# Patient Record
Sex: Female | Born: 1965
Health system: Southern US, Community
[De-identification: ages and names within clinical notes are randomized; demographics above are authoritative.]

## PROBLEM LIST (undated history)

## (undated) DIAGNOSIS — F332 Major depressive disorder, recurrent severe without psychotic features: Secondary | ICD-10-CM

## (undated) DIAGNOSIS — E119 Type 2 diabetes mellitus without complications: Secondary | ICD-10-CM

## (undated) DIAGNOSIS — D649 Anemia, unspecified: Secondary | ICD-10-CM

## (undated) DIAGNOSIS — R45851 Suicidal ideations: Secondary | ICD-10-CM

## (undated) DIAGNOSIS — G894 Chronic pain syndrome: Secondary | ICD-10-CM

## (undated) DIAGNOSIS — E669 Obesity, unspecified: Secondary | ICD-10-CM

## (undated) DIAGNOSIS — W3400XA Accidental discharge from unspecified firearms or gun, initial encounter: Secondary | ICD-10-CM

## (undated) DIAGNOSIS — Z79899 Other long term (current) drug therapy: Secondary | ICD-10-CM

## (undated) DIAGNOSIS — G43909 Migraine, unspecified, not intractable, without status migrainosus: Secondary | ICD-10-CM

## (undated) DIAGNOSIS — S21339A Puncture wound without foreign body of unspecified front wall of thorax with penetration into thoracic cavity, initial encounter: Secondary | ICD-10-CM

## (undated) DIAGNOSIS — B20 Human immunodeficiency virus [HIV] disease: Secondary | ICD-10-CM

## (undated) DIAGNOSIS — G4730 Sleep apnea, unspecified: Secondary | ICD-10-CM

## (undated) DIAGNOSIS — G8220 Paraplegia, unspecified: Secondary | ICD-10-CM

## (undated) DIAGNOSIS — Z113 Encounter for screening for infections with a predominantly sexual mode of transmission: Secondary | ICD-10-CM

## (undated) DIAGNOSIS — G839 Paralytic syndrome, unspecified: Secondary | ICD-10-CM

## (undated) HISTORY — DX: Obesity, unspecified: E66.9

## (undated) HISTORY — DX: Other long term (current) drug therapy: Z79.899

## (undated) HISTORY — DX: Accidental discharge from unspecified firearms or gun, initial encounter: W34.00XA

## (undated) HISTORY — DX: Encounter for screening for infections with a predominantly sexual mode of transmission: Z11.3

## (undated) HISTORY — PX: REDUCTION MAMMAPLASTY: SUR839

## (undated) HISTORY — PX: LEEP: SHX91

## (undated) HISTORY — DX: Migraine, unspecified, not intractable, without status migrainosus: G43.909

## (undated) HISTORY — DX: Type 2 diabetes mellitus without complications: E11.9

## (undated) HISTORY — DX: Chronic pain syndrome: G89.4

## (undated) HISTORY — PX: CHEST SURGERY: SHX595

## (undated) HISTORY — DX: Human immunodeficiency virus (HIV) disease: B20

## (undated) HISTORY — PX: COLONOSCOPY: SHX174

## (undated) HISTORY — DX: Suicidal ideations: R45.851

## (undated) HISTORY — DX: Major depressive disorder, recurrent severe without psychotic features: F33.2

## (undated) HISTORY — PX: BARIATRIC SURGERY: SHX1103

## (undated) HISTORY — PX: CHOLECYSTECTOMY: SHX55

## (undated) HISTORY — DX: Puncture wound without foreign body of unspecified front wall of thorax with penetration into thoracic cavity, initial encounter: S21.339A

## (undated) HISTORY — DX: Paraplegia, unspecified: G82.20

---

## 1998-01-15 ENCOUNTER — Other Ambulatory Visit: Admission: RE | Admit: 1998-01-15 | Discharge: 1998-01-15 | Payer: Self-pay

## 1999-09-17 ENCOUNTER — Ambulatory Visit (HOSPITAL_COMMUNITY): Admission: RE | Admit: 1999-09-17 | Discharge: 1999-09-17 | Payer: Self-pay | Admitting: *Deleted

## 2000-05-03 ENCOUNTER — Emergency Department (HOSPITAL_COMMUNITY): Admission: EM | Admit: 2000-05-03 | Discharge: 2000-05-03 | Payer: Self-pay | Admitting: Emergency Medicine

## 2000-05-05 ENCOUNTER — Emergency Department (HOSPITAL_COMMUNITY): Admission: EM | Admit: 2000-05-05 | Discharge: 2000-05-05 | Payer: Self-pay | Admitting: Emergency Medicine

## 2000-09-05 ENCOUNTER — Encounter: Admission: RE | Admit: 2000-09-05 | Discharge: 2000-12-04 | Payer: Self-pay | Admitting: Family Medicine

## 2001-02-10 ENCOUNTER — Encounter: Admission: RE | Admit: 2001-02-10 | Discharge: 2001-03-24 | Payer: Self-pay | Admitting: Family Medicine

## 2001-05-09 ENCOUNTER — Encounter: Admission: RE | Admit: 2001-05-09 | Discharge: 2001-05-09 | Payer: Self-pay | Admitting: Obstetrics & Gynecology

## 2001-05-09 ENCOUNTER — Other Ambulatory Visit: Admission: RE | Admit: 2001-05-09 | Discharge: 2001-05-09 | Payer: Self-pay | Admitting: Obstetrics

## 2002-04-17 ENCOUNTER — Ambulatory Visit: Admission: RE | Admit: 2002-04-17 | Discharge: 2002-04-17 | Payer: Self-pay | Admitting: Family Medicine

## 2002-12-01 ENCOUNTER — Emergency Department (HOSPITAL_COMMUNITY): Admission: EM | Admit: 2002-12-01 | Discharge: 2002-12-01 | Payer: Self-pay | Admitting: Emergency Medicine

## 2003-07-12 ENCOUNTER — Encounter (INDEPENDENT_AMBULATORY_CARE_PROVIDER_SITE_OTHER): Payer: Self-pay | Admitting: *Deleted

## 2003-07-12 ENCOUNTER — Encounter: Admission: RE | Admit: 2003-07-12 | Discharge: 2003-07-12 | Payer: Self-pay | Admitting: Family Medicine

## 2003-12-03 ENCOUNTER — Ambulatory Visit (HOSPITAL_COMMUNITY): Admission: RE | Admit: 2003-12-03 | Discharge: 2003-12-03 | Payer: Self-pay | Admitting: General Surgery

## 2004-04-03 ENCOUNTER — Encounter: Admission: RE | Admit: 2004-04-03 | Discharge: 2004-04-03 | Payer: Self-pay | Admitting: Family Medicine

## 2004-05-17 ENCOUNTER — Emergency Department (HOSPITAL_COMMUNITY): Admission: EM | Admit: 2004-05-17 | Discharge: 2004-05-17 | Payer: Self-pay | Admitting: Emergency Medicine

## 2004-05-27 ENCOUNTER — Encounter: Admission: RE | Admit: 2004-05-27 | Discharge: 2004-05-27 | Payer: Self-pay | Admitting: Family Medicine

## 2004-05-31 ENCOUNTER — Ambulatory Visit (HOSPITAL_BASED_OUTPATIENT_CLINIC_OR_DEPARTMENT_OTHER): Admission: RE | Admit: 2004-05-31 | Discharge: 2004-05-31 | Payer: Self-pay | Admitting: *Deleted

## 2004-06-01 ENCOUNTER — Ambulatory Visit (HOSPITAL_COMMUNITY): Admission: RE | Admit: 2004-06-01 | Discharge: 2004-06-01 | Payer: Self-pay | Admitting: *Deleted

## 2004-06-09 ENCOUNTER — Encounter: Admission: RE | Admit: 2004-06-09 | Discharge: 2004-06-09 | Payer: Self-pay | Admitting: *Deleted

## 2004-06-19 ENCOUNTER — Encounter: Admission: RE | Admit: 2004-06-19 | Discharge: 2004-06-19 | Payer: Self-pay | Admitting: *Deleted

## 2004-07-30 ENCOUNTER — Ambulatory Visit: Payer: Self-pay | Admitting: Family Medicine

## 2004-09-23 ENCOUNTER — Ambulatory Visit: Payer: Self-pay | Admitting: Psychology

## 2004-10-16 ENCOUNTER — Ambulatory Visit: Payer: Self-pay | Admitting: Psychology

## 2004-10-29 ENCOUNTER — Ambulatory Visit: Payer: Self-pay | Admitting: Family Medicine

## 2004-12-03 ENCOUNTER — Encounter: Admission: RE | Admit: 2004-12-03 | Discharge: 2004-12-03 | Payer: Self-pay | Admitting: Family Medicine

## 2005-03-03 ENCOUNTER — Ambulatory Visit: Payer: Self-pay | Admitting: Family Medicine

## 2005-04-08 ENCOUNTER — Ambulatory Visit: Payer: Self-pay | Admitting: Family Medicine

## 2005-07-07 ENCOUNTER — Encounter: Admission: RE | Admit: 2005-07-07 | Discharge: 2005-07-07 | Payer: Self-pay | Admitting: Family Medicine

## 2005-08-13 ENCOUNTER — Ambulatory Visit: Payer: Self-pay | Admitting: Family Medicine

## 2005-08-13 ENCOUNTER — Encounter: Payer: Self-pay | Admitting: Family Medicine

## 2005-08-30 ENCOUNTER — Ambulatory Visit: Payer: Self-pay | Admitting: Family Medicine

## 2005-09-07 ENCOUNTER — Ambulatory Visit: Payer: Self-pay | Admitting: Family Medicine

## 2005-09-23 ENCOUNTER — Ambulatory Visit: Payer: Self-pay | Admitting: Family Medicine

## 2005-10-20 ENCOUNTER — Ambulatory Visit: Payer: Self-pay | Admitting: Family Medicine

## 2005-11-22 ENCOUNTER — Ambulatory Visit: Payer: Self-pay | Admitting: Family Medicine

## 2005-12-08 ENCOUNTER — Ambulatory Visit: Payer: Self-pay | Admitting: Internal Medicine

## 2006-01-04 ENCOUNTER — Ambulatory Visit: Payer: Self-pay | Admitting: Family Medicine

## 2006-01-20 ENCOUNTER — Ambulatory Visit: Payer: Self-pay | Admitting: Internal Medicine

## 2006-02-24 ENCOUNTER — Ambulatory Visit: Payer: Self-pay | Admitting: Internal Medicine

## 2006-05-27 ENCOUNTER — Ambulatory Visit: Payer: Self-pay | Admitting: Family Medicine

## 2006-09-12 ENCOUNTER — Ambulatory Visit: Payer: Self-pay | Admitting: Internal Medicine

## 2006-12-19 ENCOUNTER — Ambulatory Visit: Payer: Self-pay | Admitting: Internal Medicine

## 2007-01-05 ENCOUNTER — Encounter: Admission: RE | Admit: 2007-01-05 | Discharge: 2007-04-05 | Payer: Self-pay | Admitting: Internal Medicine

## 2007-03-20 ENCOUNTER — Ambulatory Visit: Payer: Self-pay | Admitting: Internal Medicine

## 2007-03-24 ENCOUNTER — Ambulatory Visit: Payer: Self-pay | Admitting: Internal Medicine

## 2007-03-27 ENCOUNTER — Ambulatory Visit (HOSPITAL_COMMUNITY): Admission: RE | Admit: 2007-03-27 | Discharge: 2007-03-27 | Payer: Self-pay | Admitting: Internal Medicine

## 2007-04-18 ENCOUNTER — Encounter: Admission: RE | Admit: 2007-04-18 | Discharge: 2007-06-13 | Payer: Self-pay | Admitting: Internal Medicine

## 2007-05-16 ENCOUNTER — Ambulatory Visit: Payer: Self-pay | Admitting: Family Medicine

## 2007-05-31 ENCOUNTER — Ambulatory Visit: Payer: Self-pay | Admitting: Internal Medicine

## 2007-05-31 LAB — CONVERTED CEMR LAB
BUN: 12 mg/dL (ref 6–23)
CO2: 20 meq/L (ref 19–32)
Calcium: 9.2 mg/dL (ref 8.4–10.5)
Chloride: 102 meq/L (ref 96–112)
Glucose, Bld: 271 mg/dL — ABNORMAL HIGH (ref 70–99)
Sodium: 134 meq/L — ABNORMAL LOW (ref 135–145)

## 2007-06-08 ENCOUNTER — Encounter: Admission: RE | Admit: 2007-06-08 | Discharge: 2007-06-08 | Payer: Self-pay | Admitting: Internal Medicine

## 2007-07-05 ENCOUNTER — Emergency Department (HOSPITAL_COMMUNITY): Admission: EM | Admit: 2007-07-05 | Discharge: 2007-07-05 | Payer: Self-pay | Admitting: Emergency Medicine

## 2007-09-06 ENCOUNTER — Emergency Department (HOSPITAL_COMMUNITY): Admission: EM | Admit: 2007-09-06 | Discharge: 2007-09-06 | Payer: Self-pay | Admitting: Emergency Medicine

## 2007-10-23 ENCOUNTER — Emergency Department (HOSPITAL_COMMUNITY): Admission: EM | Admit: 2007-10-23 | Discharge: 2007-10-23 | Payer: Self-pay | Admitting: Emergency Medicine

## 2007-10-27 ENCOUNTER — Ambulatory Visit: Payer: Self-pay | Admitting: Internal Medicine

## 2007-10-27 LAB — CONVERTED CEMR LAB
Basophils Absolute: 0 10*3/uL (ref 0.0–0.1)
Eosinophils Absolute: 0.5 10*3/uL (ref 0.0–0.7)
HCT: 42.6 % (ref 36.0–46.0)
Lymphocytes Relative: 25 % (ref 12–46)
Lymphs Abs: 1.4 10*3/uL (ref 0.7–4.0)
MCHC: 32.6 g/dL (ref 30.0–36.0)
MCV: 80.8 fL (ref 78.0–100.0)
Monocytes Absolute: 0.6 10*3/uL (ref 0.1–1.0)
RBC: 5.27 M/uL — ABNORMAL HIGH (ref 3.87–5.11)
WBC: 5.7 10*3/uL (ref 4.0–10.5)

## 2007-11-01 ENCOUNTER — Ambulatory Visit: Payer: Self-pay | Admitting: Internal Medicine

## 2007-11-01 LAB — CONVERTED CEMR LAB
Basophils Relative: 0 % (ref 0–1)
CD4 T Helper %: 25 % — ABNORMAL LOW (ref 32–62)
Eosinophils Absolute: 0.2 10*3/uL (ref 0.0–0.7)
Eosinophils Relative: 2 % (ref 0–5)
HIV-1 RNA Quant, Log: 4.93 — ABNORMAL HIGH (ref <1.70)
Hemoglobin: 14.7 g/dL (ref 12.0–15.0)
Lymphs Abs: 2 10*3/uL (ref 0.7–4.0)
MCV: 82.5 fL (ref 78.0–100.0)
Monocytes Relative: 9 % (ref 3–12)
Neutro Abs: 4.8 10*3/uL (ref 1.7–7.7)
Platelets: 203 10*3/uL (ref 150–400)
RBC: 5.5 M/uL — ABNORMAL HIGH (ref 3.87–5.11)
RDW: 15.1 % (ref 11.5–15.5)
Triglycerides: 229 mg/dL — ABNORMAL HIGH (ref <150)
VLDL: 46 mg/dL — ABNORMAL HIGH (ref 0–40)

## 2007-12-25 ENCOUNTER — Emergency Department (HOSPITAL_COMMUNITY): Admission: EM | Admit: 2007-12-25 | Discharge: 2007-12-25 | Payer: Self-pay | Admitting: Emergency Medicine

## 2007-12-28 ENCOUNTER — Encounter: Admission: RE | Admit: 2007-12-28 | Discharge: 2007-12-28 | Payer: Self-pay | Admitting: Internal Medicine

## 2008-03-08 ENCOUNTER — Ambulatory Visit: Payer: Self-pay | Admitting: *Deleted

## 2008-03-08 ENCOUNTER — Ambulatory Visit: Payer: Self-pay | Admitting: Internal Medicine

## 2008-03-09 ENCOUNTER — Encounter (INDEPENDENT_AMBULATORY_CARE_PROVIDER_SITE_OTHER): Payer: Self-pay | Admitting: Internal Medicine

## 2008-03-09 LAB — CONVERTED CEMR LAB
Eosinophils Relative: 8 % — ABNORMAL HIGH (ref 0–5)
HCT: 47.5 % — ABNORMAL HIGH (ref 36.0–46.0)
HIV 1 RNA Quant: 92900 copies/mL — ABNORMAL HIGH (ref <50)
HIV-1 RNA Quant, Log: 4.97 — ABNORMAL HIGH (ref <1.70)
Hemoglobin: 15.3 g/dL — ABNORMAL HIGH (ref 12.0–15.0)
Lymphocytes Relative: 27 % (ref 12–46)
MCHC: 32.2 g/dL (ref 30.0–36.0)
Neutro Abs: 4.6 10*3/uL (ref 1.7–7.7)
Neutrophils Relative %: 57 % (ref 43–77)
TSH: 0.714 microintl units/mL (ref 0.350–5.50)
Total Lymphocytes %: 27 % (ref 12–46)
WBC: 8 10*3/uL (ref 4.0–10.5)

## 2008-03-10 ENCOUNTER — Emergency Department (HOSPITAL_COMMUNITY): Admission: EM | Admit: 2008-03-10 | Discharge: 2008-03-10 | Payer: Self-pay | Admitting: Emergency Medicine

## 2008-03-15 ENCOUNTER — Ambulatory Visit: Payer: Self-pay | Admitting: Internal Medicine

## 2008-04-14 ENCOUNTER — Emergency Department (HOSPITAL_COMMUNITY): Admission: EM | Admit: 2008-04-14 | Discharge: 2008-04-15 | Payer: Self-pay | Admitting: Emergency Medicine

## 2008-05-27 ENCOUNTER — Ambulatory Visit: Payer: Self-pay | Admitting: Internal Medicine

## 2008-06-28 ENCOUNTER — Encounter: Admission: RE | Admit: 2008-06-28 | Discharge: 2008-06-28 | Payer: Self-pay | Admitting: Internal Medicine

## 2008-08-02 ENCOUNTER — Inpatient Hospital Stay (HOSPITAL_COMMUNITY): Admission: EM | Admit: 2008-08-02 | Discharge: 2008-08-08 | Payer: Self-pay | Admitting: Emergency Medicine

## 2008-08-20 ENCOUNTER — Encounter (INDEPENDENT_AMBULATORY_CARE_PROVIDER_SITE_OTHER): Payer: Self-pay | Admitting: General Surgery

## 2008-08-21 ENCOUNTER — Inpatient Hospital Stay (HOSPITAL_COMMUNITY): Admission: AD | Admit: 2008-08-21 | Discharge: 2008-08-24 | Payer: Self-pay | Admitting: General Surgery

## 2008-08-30 ENCOUNTER — Emergency Department (HOSPITAL_COMMUNITY): Admission: EM | Admit: 2008-08-30 | Discharge: 2008-08-30 | Payer: Self-pay | Admitting: Emergency Medicine

## 2008-09-11 ENCOUNTER — Ambulatory Visit: Payer: Self-pay | Admitting: Internal Medicine

## 2008-10-24 ENCOUNTER — Encounter (INDEPENDENT_AMBULATORY_CARE_PROVIDER_SITE_OTHER): Payer: Self-pay | Admitting: Internal Medicine

## 2008-11-18 ENCOUNTER — Encounter: Admission: RE | Admit: 2008-11-18 | Discharge: 2008-11-18 | Payer: Self-pay | Admitting: Internal Medicine

## 2008-12-26 ENCOUNTER — Ambulatory Visit: Payer: Self-pay | Admitting: Internal Medicine

## 2008-12-26 LAB — CONVERTED CEMR LAB
ALT: 16 units/L (ref 0–35)
AST: 26 units/L (ref 0–37)
Absolute CD4: 80 #/uL — ABNORMAL LOW (ref 381–1469)
Basophils Relative: 0 % (ref 0–1)
CO2: 21 meq/L (ref 19–32)
Calcium: 9.1 mg/dL (ref 8.4–10.5)
Eosinophils Absolute: 0.1 10*3/uL (ref 0.0–0.7)
Eosinophils Relative: 2 % (ref 0–5)
Glucose, Bld: 205 mg/dL — ABNORMAL HIGH (ref 70–99)
HCT: 43 % (ref 36.0–46.0)
HDL: 37 mg/dL — ABNORMAL LOW (ref >39)
HIV 1 RNA Quant: 105000 copies/mL — ABNORMAL HIGH (ref <48)
Lymphs Abs: 1.6 10*3/uL (ref 0.7–4.0)
Monocytes Absolute: 0.6 10*3/uL (ref 0.1–1.0)
Monocytes Relative: 12 % (ref 3–12)
Neutro Abs: 2.9 10*3/uL (ref 1.7–7.7)
Neutrophils Relative %: 55 % (ref 43–77)
Potassium: 4.4 meq/L (ref 3.5–5.3)
Total Bilirubin: 0.4 mg/dL (ref 0.3–1.2)
Total CHOL/HDL Ratio: 3.9
Total lymphocyte count: 1590 cells/mcL (ref 700–3300)
Triglycerides: 168 mg/dL — ABNORMAL HIGH (ref <150)
WBC, lymph enumeration: 5.3 10*3/uL (ref 4.0–10.5)

## 2009-01-14 ENCOUNTER — Encounter
Admission: RE | Admit: 2009-01-14 | Discharge: 2009-01-14 | Payer: Self-pay | Admitting: Physical Medicine & Rehabilitation

## 2009-02-28 ENCOUNTER — Telehealth (INDEPENDENT_AMBULATORY_CARE_PROVIDER_SITE_OTHER): Payer: Self-pay | Admitting: *Deleted

## 2009-05-22 ENCOUNTER — Ambulatory Visit: Payer: Self-pay | Admitting: Internal Medicine

## 2009-05-22 LAB — CONVERTED CEMR LAB
ALT: 18 units/L (ref 0–35)
Absolute CD4: 45 #/uL — ABNORMAL LOW (ref 381–1469)
Albumin: 3.7 g/dL (ref 3.5–5.2)
Alkaline Phosphatase: 52 units/L (ref 39–117)
Basophils Relative: 0 % (ref 0–1)
CO2: 20 meq/L (ref 19–32)
Chloride: 102 meq/L (ref 96–112)
Glucose, Bld: 202 mg/dL — ABNORMAL HIGH (ref 70–99)
Platelets: 143 10*3/uL — ABNORMAL LOW (ref 150–400)
Potassium: 3.8 meq/L (ref 3.5–5.3)
RBC: 4.85 M/uL (ref 3.87–5.11)
Sodium: 138 meq/L (ref 135–145)
Total Bilirubin: 0.5 mg/dL (ref 0.3–1.2)
WBC: 4.8 10*3/uL (ref 4.0–10.5)

## 2009-06-17 ENCOUNTER — Ambulatory Visit: Payer: Self-pay | Admitting: Internal Medicine

## 2009-06-17 IMAGING — US IR US GUIDE VASC ACCESS RIGHT
1 series · 1 of 1 positions shown · non-contrast
Comparison: none

CLINICAL DATA: Cholecystitis; central venous access is requested
preop for cholecystectomy.

RIGHT  UPPER EXTREMITY PICC PLACEMENT WITH ULTRASOUND AND FLUORO
GUIDANCE
TECHNIQUE: The right arm was prepped with chlorhexidine, draped in
the usual sterile fashion using maximum barrier technique and
infiltrated locally with 1% Lidocaine.  Ultrasound demonstrated
patency of the right cephalic  vein.  Under real-time ultrasound
guidance, this vein was accessed with a 21 gauge micropuncture
needle.  Ultrasound image documentation was performed.  The needle
was exchanged over a guidewire for a peel-away sheath through which
a 5 French double  lumen PICC trimmed to 44cm was advanced,
positioned with its tip at the distal SVC/right atrial junction.
Fluoroscopy during the procedure and fluoro spot radiograph
confirms appropriate catheter position.  The catheter was flushed,
secured to the skin with Prolene sutures, and covered with a
sterile dressing.  No immediate complication.
Fluoroscopy Time: 1.6 minutes.

[Series 1: sp us guide vasc access*right* · 1 of 1 slices shown]
[im 1/1]
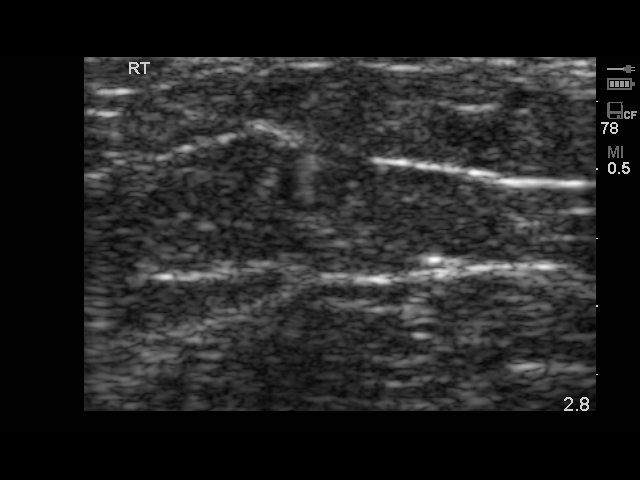

[1 of 1 positions shown; findings below may reference images not displayed]

IMPRESSION: Technically successful right arm PICC placement with ultrasound and
fluoroscopic guidance.  The catheter is ready for use.

Read by: Preko, Stephen Boadi.-MAROZS

## 2009-06-25 ENCOUNTER — Ambulatory Visit: Payer: Self-pay | Admitting: Internal Medicine

## 2009-06-25 ENCOUNTER — Encounter (INDEPENDENT_AMBULATORY_CARE_PROVIDER_SITE_OTHER): Payer: Self-pay | Admitting: Adult Health

## 2009-06-25 LAB — CONVERTED CEMR LAB: Hgb A1c MFr Bld: 9.2 % — ABNORMAL HIGH (ref 4.6–6.1)

## 2009-06-30 ENCOUNTER — Encounter: Payer: Self-pay | Admitting: Internal Medicine

## 2009-06-30 ENCOUNTER — Ambulatory Visit: Payer: Self-pay | Admitting: Internal Medicine

## 2009-06-30 DIAGNOSIS — E109 Type 1 diabetes mellitus without complications: Secondary | ICD-10-CM | POA: Insufficient documentation

## 2009-06-30 DIAGNOSIS — B20 Human immunodeficiency virus [HIV] disease: Secondary | ICD-10-CM | POA: Insufficient documentation

## 2009-06-30 DIAGNOSIS — F3289 Other specified depressive episodes: Secondary | ICD-10-CM | POA: Insufficient documentation

## 2009-06-30 DIAGNOSIS — G8220 Paraplegia, unspecified: Secondary | ICD-10-CM | POA: Insufficient documentation

## 2009-06-30 DIAGNOSIS — F329 Major depressive disorder, single episode, unspecified: Secondary | ICD-10-CM

## 2009-06-30 LAB — CONVERTED CEMR LAB: Blood Glucose, Fingerstick: 210

## 2009-07-18 ENCOUNTER — Inpatient Hospital Stay (HOSPITAL_COMMUNITY): Admission: EM | Admit: 2009-07-18 | Discharge: 2009-07-20 | Payer: Self-pay | Admitting: Emergency Medicine

## 2009-07-31 ENCOUNTER — Ambulatory Visit: Payer: Self-pay | Admitting: Internal Medicine

## 2009-07-31 LAB — CONVERTED CEMR LAB
Albumin: 4.1 g/dL (ref 3.5–5.2)
Basophils Absolute: 0 10*3/uL (ref 0.0–0.1)
CO2: 18 meq/L — ABNORMAL LOW (ref 19–32)
Calcium: 9.2 mg/dL (ref 8.4–10.5)
Chloride: 100 meq/L (ref 96–112)
Eosinophils Relative: 4 % (ref 0–5)
Glucose, Bld: 239 mg/dL — ABNORMAL HIGH (ref 70–99)
HCT: 37.9 % (ref 36.0–46.0)
HIV 1 RNA Quant: 395 copies/mL — ABNORMAL HIGH (ref <48)
HIV-1 RNA Quant, Log: 2.6 — ABNORMAL HIGH (ref <1.68)
Lymphocytes Relative: 29 % (ref 12–46)
Lymphs Abs: 1.5 10*3/uL (ref 0.7–4.0)
Monocytes Relative: 11 % (ref 3–12)
Platelets: 236 10*3/uL (ref 150–400)
Potassium: 3.9 meq/L (ref 3.5–5.3)
RBC: 4.44 M/uL (ref 3.87–5.11)
Sodium: 135 meq/L (ref 135–145)
WBC: 5.1 10*3/uL (ref 4.0–10.5)

## 2009-08-11 ENCOUNTER — Ambulatory Visit: Payer: Self-pay | Admitting: Internal Medicine

## 2009-08-11 ENCOUNTER — Encounter: Payer: Self-pay | Admitting: Internal Medicine

## 2009-08-19 ENCOUNTER — Telehealth: Payer: Self-pay | Admitting: Internal Medicine

## 2009-09-15 ENCOUNTER — Encounter: Payer: Self-pay | Admitting: Internal Medicine

## 2009-09-15 ENCOUNTER — Ambulatory Visit: Payer: Self-pay | Admitting: Internal Medicine

## 2009-09-15 DIAGNOSIS — R21 Rash and other nonspecific skin eruption: Secondary | ICD-10-CM | POA: Insufficient documentation

## 2009-09-15 DIAGNOSIS — M549 Dorsalgia, unspecified: Secondary | ICD-10-CM | POA: Insufficient documentation

## 2009-09-15 LAB — CONVERTED CEMR LAB
Alkaline Phosphatase: 101 units/L (ref 39–117)
BUN: 13 mg/dL (ref 6–23)
Blood Glucose, Fingerstick: 77
CD4 T Helper %: 10 % — ABNORMAL LOW (ref 32–62)
Chloride: 104 meq/L (ref 96–112)
Creatinine, Ser: 0.62 mg/dL (ref 0.40–1.20)
Eosinophils Absolute: 0.2 10*3/uL (ref 0.0–0.7)
Eosinophils Relative: 3 % (ref 0–5)
Glucose, Bld: 59 mg/dL — ABNORMAL LOW (ref 70–99)
HIV 1 RNA Quant: <48 copies/mL (ref <48)
Hemoglobin: 10.9 g/dL — ABNORMAL LOW (ref 12.0–15.0)
Lymphs Abs: 1.4 10*3/uL (ref 0.7–4.0)
MCV: 83.5 fL (ref 78.0–100.0)
Monocytes Absolute: 0.5 10*3/uL (ref 0.1–1.0)
Monocytes Relative: 7 % (ref 3–12)
Neutro Abs: 4.5 10*3/uL (ref 1.7–7.7)
Potassium: 4.2 meq/L (ref 3.5–5.3)
RBC: 4.05 M/uL (ref 3.87–5.11)
Total Bilirubin: 0.3 mg/dL (ref 0.3–1.2)
Total Protein: 7.6 g/dL (ref 6.0–8.3)

## 2009-09-18 ENCOUNTER — Telehealth: Payer: Self-pay | Admitting: Internal Medicine

## 2009-10-13 ENCOUNTER — Telehealth: Payer: Self-pay | Admitting: Internal Medicine

## 2009-11-26 ENCOUNTER — Ambulatory Visit: Payer: Self-pay | Admitting: Obstetrics and Gynecology

## 2009-11-26 ENCOUNTER — Encounter: Payer: Self-pay | Admitting: Obstetrics & Gynecology

## 2009-11-26 LAB — CONVERTED CEMR LAB: FSH: 2.8 milliintl units/mL

## 2009-12-05 ENCOUNTER — Ambulatory Visit (HOSPITAL_COMMUNITY): Admission: RE | Admit: 2009-12-05 | Discharge: 2009-12-05 | Payer: Self-pay | Admitting: Internal Medicine

## 2009-12-16 ENCOUNTER — Ambulatory Visit: Payer: Self-pay | Admitting: Internal Medicine

## 2010-01-01 ENCOUNTER — Telehealth: Payer: Self-pay | Admitting: Internal Medicine

## 2010-02-02 ENCOUNTER — Telehealth: Payer: Self-pay | Admitting: Internal Medicine

## 2010-02-02 ENCOUNTER — Encounter: Payer: Self-pay | Admitting: Internal Medicine

## 2010-02-03 ENCOUNTER — Ambulatory Visit: Payer: Self-pay | Admitting: Internal Medicine

## 2010-02-03 LAB — CONVERTED CEMR LAB
Alkaline Phosphatase: 141 units/L — ABNORMAL HIGH (ref 39–117)
Basophils Relative: 0 % (ref 0–1)
Eosinophils Absolute: 0.3 10*3/uL (ref 0.0–0.7)
Eosinophils Relative: 5 % (ref 0–5)
Glucose, Bld: 118 mg/dL — ABNORMAL HIGH (ref 70–99)
HCT: 39.4 % (ref 36.0–46.0)
HIV 1 RNA Quant: <48 copies/mL (ref <48)
LDL Cholesterol: 95 mg/dL (ref 0–99)
MCHC: 31.5 g/dL (ref 30.0–36.0)
MCV: 80.6 fL (ref 78.0–100.0)
Monocytes Absolute: 0.4 10*3/uL (ref 0.1–1.0)
Monocytes Relative: 7 % (ref 3–12)
Neutrophils Relative %: 69 % (ref 43–77)
RBC: 4.89 M/uL (ref 3.87–5.11)
Sodium: 139 meq/L (ref 135–145)
Total Bilirubin: 0.3 mg/dL (ref 0.3–1.2)
Total CHOL/HDL Ratio: 6
Total Protein: 7.4 g/dL (ref 6.0–8.3)
VLDL: 56 mg/dL — ABNORMAL HIGH (ref 0–40)

## 2010-02-18 ENCOUNTER — Ambulatory Visit: Payer: Self-pay | Admitting: Internal Medicine

## 2010-02-18 DIAGNOSIS — I10 Essential (primary) hypertension: Secondary | ICD-10-CM | POA: Insufficient documentation

## 2010-03-08 ENCOUNTER — Emergency Department (HOSPITAL_COMMUNITY): Admission: EM | Admit: 2010-03-08 | Discharge: 2010-03-08 | Payer: Self-pay | Admitting: Emergency Medicine

## 2010-03-27 ENCOUNTER — Ambulatory Visit: Payer: Self-pay | Admitting: Internal Medicine

## 2010-03-29 ENCOUNTER — Other Ambulatory Visit: Admission: RE | Admit: 2010-03-29 | Discharge: 2010-03-29 | Payer: Self-pay | Admitting: Obstetrics and Gynecology

## 2010-04-01 ENCOUNTER — Ambulatory Visit: Payer: Self-pay | Admitting: Obstetrics & Gynecology

## 2010-04-30 ENCOUNTER — Ambulatory Visit: Payer: Self-pay | Admitting: Obstetrics & Gynecology

## 2010-05-13 ENCOUNTER — Encounter: Payer: Self-pay | Admitting: Internal Medicine

## 2010-05-18 ENCOUNTER — Ambulatory Visit: Payer: Self-pay | Admitting: Internal Medicine

## 2010-05-25 ENCOUNTER — Ambulatory Visit (HOSPITAL_COMMUNITY): Admission: RE | Admit: 2010-05-25 | Discharge: 2010-05-25 | Payer: Self-pay | Admitting: Internal Medicine

## 2010-07-01 ENCOUNTER — Emergency Department (HOSPITAL_COMMUNITY): Admission: EM | Admit: 2010-07-01 | Discharge: 2010-07-01 | Payer: Self-pay | Admitting: Emergency Medicine

## 2010-07-22 ENCOUNTER — Ambulatory Visit: Payer: Self-pay | Admitting: Internal Medicine

## 2010-07-22 LAB — CONVERTED CEMR LAB
ALT: 14 units/L (ref 0–35)
AST: 12 units/L (ref 0–37)
Albumin: 4.1 g/dL (ref 3.5–5.2)
Alkaline Phosphatase: 127 units/L — ABNORMAL HIGH (ref 39–117)
Basophils Absolute: 0 10*3/uL (ref 0.0–0.1)
Basophils Relative: 0 % (ref 0–1)
Calcium: 9.1 mg/dL (ref 8.4–10.5)
Chloride: 106 meq/L (ref 96–112)
Creatinine, Ser: 0.67 mg/dL (ref 0.40–1.20)
Eosinophils Absolute: 0.1 10*3/uL (ref 0.0–0.7)
MCHC: 33.3 g/dL (ref 30.0–36.0)
MCV: 81.4 fL (ref 78.0–100.0)
Neutro Abs: 3.7 10*3/uL (ref 1.7–7.7)
Neutrophils Relative %: 65 % (ref 43–77)
Potassium: 4.1 meq/L (ref 3.5–5.3)
RDW: 15.2 % (ref 11.5–15.5)

## 2010-08-05 ENCOUNTER — Ambulatory Visit: Payer: Self-pay | Admitting: Internal Medicine

## 2010-09-24 ENCOUNTER — Inpatient Hospital Stay (HOSPITAL_COMMUNITY): Admission: EM | Admit: 2010-09-24 | Discharge: 2010-04-07 | Payer: Self-pay | Admitting: Emergency Medicine

## 2010-09-25 ENCOUNTER — Ambulatory Visit: Payer: Self-pay | Admitting: Obstetrics & Gynecology
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
## 2010-10-23 ENCOUNTER — Ambulatory Visit: Admit: 2010-10-23 | Payer: Self-pay | Admitting: Family Medicine

## 2010-11-03 ENCOUNTER — Ambulatory Visit: Admit: 2010-11-03 | Payer: Self-pay | Admitting: Internal Medicine

## 2010-11-08 ENCOUNTER — Encounter: Payer: Self-pay | Admitting: Internal Medicine

## 2010-11-08 ENCOUNTER — Encounter: Payer: Self-pay | Admitting: Family Medicine

## 2010-11-09 ENCOUNTER — Encounter: Payer: Self-pay | Admitting: Internal Medicine

## 2010-11-13 ENCOUNTER — Other Ambulatory Visit: Payer: Self-pay | Admitting: Obstetrics and Gynecology

## 2010-11-13 ENCOUNTER — Ambulatory Visit
Admission: RE | Admit: 2010-11-13 | Discharge: 2010-11-13 | Payer: Self-pay | Source: Home / Self Care | Attending: Family Medicine | Admitting: Family Medicine

## 2010-11-14 ENCOUNTER — Encounter: Payer: Self-pay | Admitting: Internal Medicine

## 2010-11-14 ENCOUNTER — Observation Stay (HOSPITAL_COMMUNITY)
Admission: EM | Admit: 2010-11-14 | Discharge: 2010-11-16 | Payer: Self-pay | Source: Home / Self Care | Attending: Internal Medicine | Admitting: Internal Medicine

## 2010-11-14 LAB — GLUCOSE, CAPILLARY
Glucose-Capillary: 173 mg/dL — ABNORMAL HIGH (ref 70–99)
Glucose-Capillary: 129 mg/dL — ABNORMAL HIGH (ref 70–99)
Glucose-Capillary: 135 mg/dL — ABNORMAL HIGH (ref 70–99)

## 2010-11-14 LAB — DIFFERENTIAL
Lymphocytes Relative: 28 % (ref 12–46)
Lymphs Abs: 1.7 10*3/uL (ref 0.7–4.0)
Neutrophils Relative %: 61 % (ref 43–77)

## 2010-11-14 LAB — CBC
HCT: 34.8 % — ABNORMAL LOW (ref 36.0–46.0)
MCV: 78 fL (ref 78.0–100.0)
RBC: 4.46 MIL/uL (ref 3.87–5.11)
WBC: 6 10*3/uL (ref 4.0–10.5)

## 2010-11-14 LAB — COMPREHENSIVE METABOLIC PANEL
Albumin: 3.5 g/dL (ref 3.5–5.2)
Alkaline Phosphatase: 95 U/L (ref 39–117)
BUN: 13 mg/dL (ref 6–23)
Chloride: 102 mEq/L (ref 96–112)
Glucose, Bld: 179 mg/dL — ABNORMAL HIGH (ref 70–99)
Potassium: 3.3 mEq/L — ABNORMAL LOW (ref 3.5–5.1)
Total Bilirubin: 0.3 mg/dL (ref 0.3–1.2)

## 2010-11-14 LAB — URINALYSIS, ROUTINE W REFLEX MICROSCOPIC
Ketones, ur: NEGATIVE mg/dL
Nitrite: POSITIVE — AB
Specific Gravity, Urine: 1.027 (ref 1.005–1.030)
Urine Glucose, Fasting: NEGATIVE mg/dL
pH: 6 (ref 5.0–8.0)

## 2010-11-14 LAB — RAPID URINE DRUG SCREEN, HOSP PERFORMED
Amphetamines: NOT DETECTED
Barbiturates: NOT DETECTED

## 2010-11-14 LAB — URINE MICROSCOPIC-ADD ON

## 2010-11-14 LAB — POCT CARDIAC MARKERS: Troponin i, poc: 0.07 ng/mL (ref 0.00–0.09)

## 2010-11-14 LAB — LIPASE, BLOOD: Lipase: 15 U/L (ref 11–59)

## 2010-11-14 NOTE — Progress Notes (Signed)
NAME:  Ellen Lane, Ellen Lane              ACCOUNT NO.:  [id number]  MEDICAL RECORD NO.:  [id number]          PATIENT TYPE:  WOC  LOCATION:  [facility]                   FACILITY:  [facility]  PHYSICIAN:  Catalina Antigua, MD     DATE OF BIRTH:  [birth year]  DATE OF SERVICE:                                 CLINIC NOTE  This is a 45 year old para G3, P2-0-1-2 with history of diabetes, HIV, hypertension, and paraplegic with a history of low-grade SIL in February of 2011, followed by normal colposcopy in June of 2011, who presents today for 6 months repeat Pap smear.  The patient is also complaining of a few days of nausea and emesis associated with some abdominal cramping pain.  Pap smear was performed today.  The patient will be contacted with any abnormal results.  Otherwise, patient is to present in 6 months for repeat Pap smear.  The patient then was advised to increase her fluid intake as to prevent dehydration and she may have gastroenteritis. The patient was advised to follow up with her primary care physician or present to the emergency room if her symptoms persisted by Monday.          ______________________________ Catalina Antigua, MD    PC/MEDQ  D:  11/13/2010  T:  11/14/2010  Job:  [id number]

## 2010-11-15 ENCOUNTER — Encounter: Payer: Self-pay | Admitting: Internal Medicine

## 2010-11-15 DIAGNOSIS — N390 Urinary tract infection, site not specified: Secondary | ICD-10-CM | POA: Insufficient documentation

## 2010-11-15 DIAGNOSIS — Z87448 Personal history of other diseases of urinary system: Secondary | ICD-10-CM | POA: Insufficient documentation

## 2010-11-15 LAB — COMPREHENSIVE METABOLIC PANEL
AST: 12 U/L (ref 0–37)
Albumin: 2.9 g/dL — ABNORMAL LOW (ref 3.5–5.2)
Calcium: 8.5 mg/dL (ref 8.4–10.5)
Chloride: 107 mEq/L (ref 96–112)
Creatinine, Ser: 0.5 mg/dL (ref 0.4–1.2)
GFR calc Af Amer: >60 mL/min (ref >60)
Total Bilirubin: 0.1 mg/dL — ABNORMAL LOW (ref 0.3–1.2)
Total Protein: 5.9 g/dL — ABNORMAL LOW (ref 6.0–8.3)

## 2010-11-15 LAB — CBC
MCH: 25.4 pg — ABNORMAL LOW (ref 26.0–34.0)
Platelets: 183 10*3/uL (ref 150–400)
RBC: 4.1 MIL/uL (ref 3.87–5.11)
RDW: 14.5 % (ref 11.5–15.5)

## 2010-11-15 LAB — HEMOGLOBIN A1C
Hgb A1c MFr Bld: 7.1 % — ABNORMAL HIGH (ref <5.7)
Mean Plasma Glucose: 157 mg/dL — ABNORMAL HIGH (ref <117)

## 2010-11-15 LAB — GLUCOSE, CAPILLARY
Glucose-Capillary: 158 mg/dL — ABNORMAL HIGH (ref 70–99)
Glucose-Capillary: 232 mg/dL — ABNORMAL HIGH (ref 70–99)
Glucose-Capillary: 271 mg/dL — ABNORMAL HIGH (ref 70–99)

## 2010-11-16 ENCOUNTER — Encounter (INDEPENDENT_AMBULATORY_CARE_PROVIDER_SITE_OTHER): Payer: Self-pay | Admitting: *Deleted

## 2010-11-16 LAB — BASIC METABOLIC PANEL
BUN: 7 mg/dL (ref 6–23)
Chloride: 106 mEq/L (ref 96–112)
Creatinine, Ser: 0.65 mg/dL (ref 0.4–1.2)
GFR calc non Af Amer: >60 mL/min (ref >60)
Glucose, Bld: 215 mg/dL — ABNORMAL HIGH (ref 70–99)

## 2010-11-16 LAB — CBC
HCT: 35.1 % — ABNORMAL LOW (ref 36.0–46.0)
MCH: 26.1 pg (ref 26.0–34.0)
MCV: 77.7 fL — ABNORMAL LOW (ref 78.0–100.0)
Platelets: 218 10*3/uL (ref 150–400)
RDW: 14.5 % (ref 11.5–15.5)

## 2010-11-16 LAB — GLUCOSE, CAPILLARY

## 2010-11-17 ENCOUNTER — Ambulatory Visit: Admit: 2010-11-17 | Payer: Self-pay | Admitting: Internal Medicine

## 2010-11-17 LAB — URINE CULTURE: Colony Count: >=100000

## 2010-11-18 LAB — HIV-1 RNA QUANT-NO REFLEX-BLD
HIV 1 RNA Quant: <20 copies/mL (ref <20)
HIV-1 RNA Quant, Log: <1.3 {Log} (ref <1.30)

## 2010-11-19 LAB — HIV 1/2 CONFIRMATION: HIV-1 antibody: POSITIVE

## 2010-11-19 NOTE — Assessment & Plan Note (Signed)
Summary: 2WK F/U/VS   CC:  follow-up visit, lab results, and c/o mouth soreness and rash on face (cream is no longer helping).  History of Present Illness: Pt c/o some painful lesions on the roof of her mouth.  She also has a pruritic rash on her face.  The triamcinolone cream was helping but it no longer is effective. No missed doses of her Atripla.  Preventive Screening-Counseling & Management  Alcohol-Tobacco     Alcohol drinks/day: 0     Smoking Status: never     Passive Smoke Exposure: No  Caffeine-Diet-Exercise     Caffeine use/day: 1     Does Patient Exercise: no  Safety-Violence-Falls     Seat Belt Use: yes      Drug Use:  No.     Updated Prior Medication List: ATRIPLA 600-200-300 MG TABS (EFAVIRENZ-EMTRICITAB-TENOFOVIR) Take 1 tablet by mouth at bedtime CHLORTHALIDONE 25 MG TABS (CHLORTHALIDONE) take 1/2 tablet a day ONE TOUCH DELICA LANCETS  MISC (LANCETS) as directed LEXAPRO 20 MG TABS (ESCITALOPRAM OXALATE) Take 1 tablet by mouth once a day BENAZEPRIL HCL 5 MG TABS (BENAZEPRIL HCL) Take 1 tablet by mouth once a day LANTUS 100 UNIT/ML SOLN (INSULIN GLARGINE) 70 units a day METFORMIN HCL 1000 MG TABS (METFORMIN HCL) Take 1 tablet by mouth two times a day NOVOLOG 100 UNIT/ML SOLN (INSULIN ASPART) per sliding scale PERCOCET 10-325 MG TABS (OXYCODONE-ACETAMINOPHEN) Take 1 tablet by mouth every 8 hours as needed VALTREX 500 MG TABS (VALACYCLOVIR HCL) Take 1 tablet by mouth two times a day BETAMETHASONE DIPROPIONATE 0.05 % CREA (BETAMETHASONE DIPROPIONATE) apply two times a day  Current Allergies (reviewed today): ! SULFA Review of Systems  The patient denies anorexia, fever, and weight loss.    Vital Signs:  Patient profile:   45 year old female Menstrual status:  irregular Temp:     98.5 degrees F (36.94 degrees C) oral Pulse rate:   90 / minute BP sitting:   152 / 97  (right arm)  Vitals Entered By: Wendall Mola CMA Duncan Dull) (Feb 18, 2010 2:18  PM) CC: follow-up visit, lab results, c/o mouth soreness and rash on face (cream is no longer helping) Is Patient Diabetic? Yes Did you bring your meter with you today? No Pain Assessment Patient in pain? yes     Location: mouth Intensity: 8 Type: aching Onset of pain  Constant Nutritional Status BMI of > 30 = obese Nutritional Status Detail appetite "good"  Does patient need assistance? Functional Status Cook/clean, Shopping, Social activities Ambulation Wheelchair Comments pt uses wheelchair   Physical Exam  General:  alert, well-developed, well-nourished, and well-hydrated.   Head:  normocephalic and atraumatic.   Mouth:  few irregular lesions on upper palate Lungs:  normal breath sounds.          Medication Adherence: 02/18/2010   Adherence to medications reviewed with patient. Counseling to provide adequate adherence provided   Prevention For Positives: 02/18/2010   Safe sex practices discussed with patient. Condoms offered.                             Impression & Recommendations:  Problem # 1:  HIV DISEASE (ICD-042) Pt.s most recent CD4ct was 190 and VL <48 .  Pt instructed to continue the current antiretroviral regimen.  Pt encouraged to take medication regularly and not miss doses.  Pt will f/u in 3 months for repeat blood work and will see  me 2 weeks later.  Diagnostics Reviewed:  CD4: 190 (02/03/2010)   WBC: 5.7 (02/03/2010)   PMN (bands): 0 (12/26/2008)   Hgb: 12.4 (02/03/2010)   HCT: 39.4 (02/03/2010)   Platelets: 243 (02/03/2010) HIV-1 RNA: <48 copies/mL (02/03/2010)     Problem # 2:  FACIAL RASH (ICD-782.1)  The following medications were removed from the medication list:    Triamcinolone Acetonide 0.1 % Crea (Triamcinolone acetonide) .Marland Kitchen... Apply two times a day Her updated medication list for this problem includes:    Betamethasone Dipropionate 0.05 % Crea (Betamethasone dipropionate) .Marland Kitchen... Apply two times a day  Problem # 3:  ESSENTIAL  HYPERTENSION, BENIGN (ICD-401.1) referred her back to Dr.Talbot her PCP for adjustment of medication. Her updated medication list for this problem includes:    Chlorthalidone 25 Mg Tabs (Chlorthalidone) .Marland Kitchen... Take 1/2 tablet a day    Benazepril Hcl 5 Mg Tabs (Benazepril hcl) .Marland Kitchen... Take 1 tablet by mouth once a day  Medications Added to Medication List This Visit: 1)  Valtrex 500 Mg Tabs (Valacyclovir hcl) .... Take 1 tablet by mouth two times a day 2)  Betamethasone Dipropionate 0.05 % Crea (Betamethasone dipropionate) .... Apply two times a day  Other Orders: Est. Patient Level III (12458) Future Orders: T-CD4SP ([facility]) (CD4SP) ... 05/19/2010 T-HIV Viral Load [phone]) ... 05/19/2010 T-Comprehensive Metabolic Panel [phone]) ... 05/19/2010 T-CBC w/Diff ([id number]) ... 05/19/2010  Patient Instructions: 1)  Please schedule a follow-up appointment in 3 months, 2 weeks after labs.  Prescriptions: ATRIPLA 600-200-300 MG TABS (EFAVIRENZ-EMTRICITAB-TENOFOVIR) Take 1 tablet by mouth at bedtime  #30 x 5   Entered and Authorized by:   Yisroel Ramming MD   Signed by:   Yisroel Ramming MD on 02/18/2010   Method used:   Print then Give to Patient   RxID:   [id number] VALTREX 500 MG TABS (VALACYCLOVIR HCL) Take 1 tablet by mouth two times a day  #14 x 0   Entered and Authorized by:   Yisroel Ramming MD   Signed by:   Yisroel Ramming MD on 02/18/2010   Method used:   Print then Give to Patient   RxID:   [id number] BETAMETHASONE DIPROPIONATE 0.05 % CREA (BETAMETHASONE DIPROPIONATE) apply two times a day  #45 gm x 3   Entered and Authorized by:   Yisroel Ramming MD   Signed by:   Yisroel Ramming MD on 02/18/2010   Method used:   Print then Give to Patient   RxID:   [id number]

## 2010-11-19 NOTE — Miscellaneous (Signed)
Summary: Orders Update labs  Clinical Lists Changes  Problems: Added new problem of ENCOUNTER FOR LONG-TERM USE OF OTHER MEDICATIONS (ICD-V58.69) Orders: Added new Test order of T-Lipid Profile [phone]) - Signed Added new Test order of T-CBC w/Diff [phone]) - Signed Added new Test order of T-CD4SP [facility]) (CD4SP) - Signed Added new Test order of T-Comprehensive Metabolic Panel [phone]) - Signed Added new Test order of T-HIV Viral Load [phone]) - Signed Added new Test order of T-RPR (Syphilis) [phone]) - Signed     Process Orders Check Orders Results:     [facility]: Check successful Order queued for requisitioning for [facility]: February 02, 2010 12:17 PM  Tests Sent for requisitioning (February 02, 2010 12:17 PM):     02/03/2010: [facility] -- T-Lipid Profile [phone] (signed)     02/03/2010: [facility] -- T-CBC w/Diff [[id number]] (signed)     02/03/2010: [facility] -- T-Comprehensive Metabolic Panel [[id number]] (signed)     02/03/2010: [facility] -- T-HIV Viral Load [phone] (signed)     02/03/2010: [facility] -- T-RPR (Syphilis) [phone] (signed)

## 2010-11-19 NOTE — Miscellaneous (Signed)
Summary: RW Update  Clinical Lists Changes  Observations: Added new observation of DATE1STVISIT: 02/18/2010 (05/13/2010 13:44) Added new observation of RWPARTICIP: Yes (05/13/2010 13:44)

## 2010-11-19 NOTE — Assessment & Plan Note (Signed)
Summary: 75month f/u [mkj]   CC:  follow-up visit and lab results.  History of Present Illness: Pt here for f/u.  She has bee on a diet and has lost about 50lbs which she is happy about. Taking her Atripla every day.  Depression History:      The patient denies a depressed mood most of the day and a diminished interest in her usual daily activities.        The patient denies that she feels like life is not worth living, denies that she wishes that she were dead, and denies that she has thought about ending her life.        Preventive Screening-Counseling & Management  Alcohol-Tobacco     Alcohol drinks/day: 0     Smoking Status: never     Passive Smoke Exposure: No  Caffeine-Diet-Exercise     Caffeine use/day: 1     Does Patient Exercise: no  Hep-HIV-STD-Contraception     HIV Risk: risk noted  Safety-Violence-Falls     Seat Belt Use: yes      Drug Use:  No.     Updated Prior Medication List: ATRIPLA 600-200-300 MG TABS (EFAVIRENZ-EMTRICITAB-TENOFOVIR) Take 1 tablet by mouth at bedtime CHLORTHALIDONE 25 MG TABS (CHLORTHALIDONE) take 1/2 tablet a day ONE TOUCH DELICA LANCETS  MISC (LANCETS) as directed LEXAPRO 20 MG TABS (ESCITALOPRAM OXALATE) Take 1 tablet by mouth once a day LANTUS 100 UNIT/ML SOLN (INSULIN GLARGINE) 70 units a day METFORMIN HCL 1000 MG TABS (METFORMIN HCL) Take 1 tablet by mouth two times a day NOVOLOG 100 UNIT/ML SOLN (INSULIN ASPART) per sliding scale PERCOCET 10-325 MG TABS (OXYCODONE-ACETAMINOPHEN) Take 1 tablet by mouth every 8 hours as needed BETAMETHASONE DIPROPIONATE 0.05 % CREA (BETAMETHASONE DIPROPIONATE) apply two times a day  Current Allergies (reviewed today): ! SULFA Past History:  Past Medical History: Last updated: 06/30/2009 Depression Diabetes mellitus, type I HIV disease  Review of Systems  The patient denies anorexia, fever, chest pain, and headaches.    Vital Signs:  Patient profile:   45 year old female Menstrual  status:  irregular Height:      62 inches (157.48 cm) Weight:      270.0 pounds (122.73 kg) BMI:     49.56 Temp:     98.6 degrees F (37.00 degrees C) oral Pulse rate:   70 / minute BP sitting:   140 / 86  (left arm)  Vitals Entered By: Wendall Mola CMA Duncan Dull) (August 05, 2010 10:50 AM) CC: follow-up visit, lab results Is Patient Diabetic? Yes Did you bring your meter with you today? No Pain Assessment Patient in pain? yes     Location: back Intensity: 9 Type: aching Onset of pain  Constant Nutritional Status BMI of > 30 = obese Nutritional Status Detail appetite "fine"  Does patient need assistance? Functional Status Self care Ambulation Wheelchair Comments no missed doses of meds per pt. pt. is dieting pt. uses wheelchair   Physical Exam  General:  alert, well-developed, well-nourished, and well-hydrated.   Head:  normocephalic and atraumatic.   Mouth:  pharynx pink and moist.   Lungs:  normal breath sounds.     Impression & Recommendations:  Problem # 1:  HIV DISEASE (ICD-042)  Pt.s most recent CD4ct was 320 and VL <20 .  Pt instructed to continue the current antiretroviral regimen.  Pt encouraged to take medication regularly and not miss doses.  Pt will f/u in 3 months for repeat blood work and will see  me 2 weeks later. Influenza vaccine given.  Diagnostics Reviewed:  HIV: CDC-defined AIDS (02/18/2010)   CD4: 320 (07/24/2010)   WBC: 5.6 (07/22/2010)   PMN (bands): 0 (12/26/2008)   Hgb: 11.2 (07/22/2010)   HCT: 33.6 (07/22/2010)   Platelets: 253 (07/22/2010) HIV-1 RNA: <20 copies/mL (07/22/2010)     Future Orders: T-Hepatitis B Surface Antigen ([id number]) ... 11/03/2010 T-Hepatitis C Antibody ([id number]) ... 11/03/2010  Other Orders: Est. Patient Level III (65784) Influenza Vaccine MCR (69629) TB Skin Test [phone]) Admin 1st Vaccine (32440) Future Orders: T-CD4SP ([facility]) (CD4SP) ... 11/03/2010 T-HIV Viral Load [id number]) ...  11/03/2010 T-Comprehensive Metabolic Panel ([phone]) ... 11/03/2010 T-CBC w/Diff ([id number]) ... 11/03/2010  Patient Instructions: 1)  Please schedule a follow-up appointment in 3 months, 2 weeks after the labs.  Prescriptions: BETAMETHASONE DIPROPIONATE 0.05 % CREA (BETAMETHASONE DIPROPIONATE) apply two times a day  #45 gm x 3   Entered and Authorized by:   Yisroel Ramming MD   Signed by:   Yisroel Ramming MD on 08/05/2010   Method used:   Print then Give to Patient   RxID:   [id number]   Prevention & Chronic Care Immunizations   Influenza vaccine: Fluvax MCR  (08/05/2010)    Tetanus booster: Not documented    Pneumococcal vaccine: Historical  (06/18/2009)  Other Screening   Pap smear: wnl  (08/11/2009)    Mammogram: Not documented   Smoking status: never  (08/05/2010)  Diabetes Mellitus   HgbA1C: 9.2  (06/25/2009)    Eye exam: Not documented    Foot exam: Not documented   High risk foot: Not documented   Foot care education: Not documented    Urine microalbumin/creatinine ratio: Not documented  Lipids   Total Cholesterol: 181  (02/03/2010)   LDL: 95  (02/03/2010)   LDL Direct: Not documented   HDL: 30  (02/03/2010)   Triglycerides: 280  (02/03/2010)  Hypertension   Last Blood Pressure: 140 / 86  (08/05/2010)   Serum creatinine: 0.67  (07/22/2010)   Serum potassium 4.1  (07/22/2010) CMP ordered   Self-Management Support :    Diabetes self-management support: Not documented    Hypertension self-management support: Not documented        Immunizations Administered:  Influenza Vaccine # 1:    Vaccine Type: Fluvax MCR    Site: right deltoid    Mfr: Novartis    Dose: 0.5 ml    Route: IM    Given by: Wendall Mola CMA ( AAMA)    Exp. Date: 01/17/2011    Lot #: [id number]    VIS given: 05/12/10 version given August 05, 2010.  PPD Skin Test:    Vaccine Type: PPD    Site: left forearm    Mfr: Sanofi Pasteur    Dose: 0.1 ml     Route: ID    Given by: Wendall Mola CMA ( AAMA)    Exp. Date: 08/20/2011    Lot #: [id number]  Flu Vaccine Consent Questions:    Do you have a history of severe allergic reactions to this vaccine? no    Any prior history of allergic reactions to egg and/or gelatin? no    Do you have a sensitivity to the preservative Thimersol? no    Do you have a past history of Guillan-Barre Syndrome? no    Do you currently have an acute febrile illness? no    Have you ever had a severe reaction to latex? no    Vaccine information given and explained to  patient? yes    Are you currently pregnant? no

## 2010-11-19 NOTE — Miscellaneous (Signed)
Summary: HIPAA Restrictions  HIPAA Restrictions   Imported By: Florinda Marker 02/03/2010 15:38:33  _____________________________________________________________________  External Attachment:    Type:   Image     Comment:   External Document

## 2010-11-19 NOTE — Progress Notes (Signed)
Summary: refill/mld  Phone Note Refill Request Message from:  Fax from Pharmacy  Refills Requested: Medication #1:  ATRIPLA 600-200-300 MG TABS Take 1 tablet by mouth at bedtime   Last Refilled: 12/29/2009  Method Requested: Electronic Next Appointment Scheduled: Feb 18, 2010 Initial call taken by: Paulo Fruit  BS,CPht II,MPH,  February 02, 2010 2:37 PM    Prescriptions: ATRIPLA 600-200-300 MG TABS (EFAVIRENZ-EMTRICITAB-TENOFOVIR) Take 1 tablet by mouth at bedtime  #30 x 2   Entered by:   Paulo Fruit  BS,CPht II,MPH   Authorized by:   Yisroel Ramming MD   Signed by:   Paulo Fruit  BS,CPht II,MPH on 02/02/2010   Method used:   Electronically to        [address] [phone]* (retail)       [address]       [place], [place]  [address]       Ph: [phone]       Fax: [phone]   RxID:   [id number]  Paulo Fruit  BS,CPht II,MPH  February 02, 2010 2:37 PM
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
## 2010-11-19 NOTE — Progress Notes (Signed)
Summary: med refill  Phone Note Refill Request Message from:  Fax from Pharmacy on January 01, 2010 3:04 PM  Refills Requested: Medication #1:  METFORMIN HCL 1000 MG TABS Take 1 tablet by mouth two times a day   Dosage confirmed as above?Dosage Confirmed   Brand Name Necessary? No   Supply Requested: 1 month  Method Requested: Telephone to Pharmacy Initial call taken by: Sharen Heck RN,  January 01, 2010 3:04 PM    Prescriptions: METFORMIN HCL 1000 MG TABS (METFORMIN HCL) Take 1 tablet by mouth two times a day  #60 x 3   Entered by:   Sharen Heck RN   Authorized by:   Yisroel Ramming MD   Signed by:   Sharen Heck RN on 01/01/2010   Method used:   Electronically to        [facility] [phone]* (retail)       [address]       [place], [place]  [address]       Ph: [phone]       Fax: [phone]   RxID:   [id number]

## 2010-11-21 LAB — CULTURE, BLOOD (ROUTINE X 2)
Culture  Setup Time: 201201290109
Culture: NO GROWTH
Culture: NO GROWTH

## 2010-11-25 ENCOUNTER — Encounter (INDEPENDENT_AMBULATORY_CARE_PROVIDER_SITE_OTHER): Payer: Self-pay | Admitting: *Deleted

## 2010-11-25 NOTE — Discharge Summary (Signed)
Summary: Hospital Discharge Update    Hospital Discharge Update:  Date of Admission: 11/14/2010 Date of Discharge: 11/16/2010  Brief Summary:  This is a 45 year old female with PMH significant for paraplegia from T3 down, DM who presented to the the ED with abdominal pain, nausea and emesis and was found to have a UTI. Patient was started on Rocephin, Zofran and Morphin for symptom management. On day 2 patient improved significantly. She was tolerating by mouth intake. Patient was afebrile and no leukocytosis was noted. Patien will be discharged on Ciprofloxacin to complete a  14 day course. Patient need to follow up by Urology since patient had multiple episodes of UTI last year. May also need to follow up with Neurologist.  Lab or other results pending at discharge:  Urine culture and blood culture.  Other follow-up issues:  At the next visit U/A should be rechecked . Consider to refer patient to Urology since patient had multiple episodes of UTI   Problem list changes:  Added new problem of UTI'S, RECURRENT (ICD-599.0) Added new problem of PYELONEPHRITIS, HX OF (ICD-V13.00)  Medication list changes:  Added new medication of CIPROFLOXACIN HCL 500 MG TABS (CIPROFLOXACIN HCL) Take one tablet by mouth two times a day for 14 days - Signed Rx of CIPROFLOXACIN HCL 500 MG TABS (CIPROFLOXACIN HCL) Take one tablet by mouth two times a day for 14 days;  #28 x 0;  Signed;  Entered by: Almyra Deforest MD;  Authorized by: Almyra Deforest MD;  Method used: Print then Give to Patient  Discharge medications:  ATRIPLA 600-200-300 MG TABS (EFAVIRENZ-EMTRICITAB-TENOFOVIR) Take 1 tablet by mouth at bedtime CHLORTHALIDONE 25 MG TABS (CHLORTHALIDONE) take 1/2 tablet a day ONE TOUCH DELICA LANCETS  MISC (LANCETS) as directed LEXAPRO 20 MG TABS (ESCITALOPRAM OXALATE) Take 1 tablet by mouth once a day LANTUS 100 UNIT/ML SOLN (INSULIN GLARGINE) 70 units a day METFORMIN HCL 1000 MG TABS (METFORMIN HCL) Take 1  tablet by mouth two times a day NOVOLOG 100 UNIT/ML SOLN (INSULIN ASPART) per sliding scale PERCOCET 10-325 MG TABS (OXYCODONE-ACETAMINOPHEN) Take 1 tablet by mouth every 8 hours as needed BETAMETHASONE DIPROPIONATE 0.05 % CREA (BETAMETHASONE DIPROPIONATE) apply two times a day CIPROFLOXACIN HCL 500 MG TABS (CIPROFLOXACIN HCL) Take one tablet by mouth two times a day for 14 days  Other patient instructions:  You need to follow up with your PCP at [facility] on 11/18/2010. Phone number [phone].

## 2010-11-25 NOTE — Miscellaneous (Signed)
  Clinical Lists Changes  Observations: Added new observation of INCOMESOURCE: UNKNOWN (11/16/2010 15:28) Added new observation of HOUSEINCOME: 0  (11/16/2010 15:28) Added new observation of HOUSING: Unknown  (11/16/2010 15:28) Added new observation of YEARLYEXPEN: 0  (11/16/2010 15:28)
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
## 2010-12-03 NOTE — Miscellaneous (Signed)
  Clinical Lists Changes 

## 2010-12-31 LAB — CBC
MCH: 27.1 pg (ref 26.0–34.0)
MCHC: 33.2 g/dL (ref 30.0–36.0)
MCV: 81.7 fL (ref 78.0–100.0)
Platelets: 202 10*3/uL (ref 150–400)
RDW: 13.9 % (ref 11.5–15.5)

## 2010-12-31 LAB — COMPREHENSIVE METABOLIC PANEL
AST: 17 U/L (ref 0–37)
Albumin: 4 g/dL (ref 3.5–5.2)
BUN: 11 mg/dL (ref 6–23)
Calcium: 9.5 mg/dL (ref 8.4–10.5)
Chloride: 102 mEq/L (ref 96–112)
Creatinine, Ser: 0.7 mg/dL (ref 0.4–1.2)
GFR calc Af Amer: >60 mL/min (ref >60)
Total Bilirubin: 0.5 mg/dL (ref 0.3–1.2)
Total Protein: 8.5 g/dL — ABNORMAL HIGH (ref 6.0–8.3)

## 2010-12-31 LAB — URINE CULTURE
Colony Count: >=100000
Culture  Setup Time: 201109141523

## 2010-12-31 LAB — URINALYSIS, ROUTINE W REFLEX MICROSCOPIC
Bilirubin Urine: NEGATIVE
Nitrite: POSITIVE — AB
Protein, ur: 100 mg/dL — AB
Specific Gravity, Urine: 1.025 (ref 1.005–1.030)
Urobilinogen, UA: 1 mg/dL (ref 0.0–1.0)

## 2010-12-31 LAB — T-HELPER CELL (CD4) - (RCID CLINIC ONLY)
CD4 % Helper T Cell: 21 % — ABNORMAL LOW (ref 33–55)
CD4 T Cell Abs: 320 uL — ABNORMAL LOW (ref 400–2700)

## 2010-12-31 LAB — DIFFERENTIAL
Basophils Absolute: 0 10*3/uL (ref 0.0–0.1)
Lymphocytes Relative: 15 % (ref 12–46)
Lymphs Abs: 1.1 10*3/uL (ref 0.7–4.0)
Monocytes Absolute: 0.4 10*3/uL (ref 0.1–1.0)
Neutro Abs: 6 10*3/uL (ref 1.7–7.7)

## 2010-12-31 LAB — URINE MICROSCOPIC-ADD ON

## 2011-01-01 ENCOUNTER — Ambulatory Visit: Payer: Medicare Other | Admitting: Obstetrics and Gynecology

## 2011-01-03 LAB — DIFFERENTIAL
Basophils Relative: 0 % (ref 0–1)
Lymphocytes Relative: 9 % — ABNORMAL LOW (ref 12–46)
Lymphs Abs: 1.2 10*3/uL (ref 0.7–4.0)
Monocytes Relative: 4 % (ref 3–12)
Neutro Abs: 11.5 10*3/uL — ABNORMAL HIGH (ref 1.7–7.7)
Neutrophils Relative %: 87 % — ABNORMAL HIGH (ref 43–77)

## 2011-01-03 LAB — CULTURE, BLOOD (ROUTINE X 2)
Culture: NO GROWTH
Culture: NO GROWTH

## 2011-01-03 LAB — CARDIAC PANEL(CRET KIN+CKTOT+MB+TROPI)
Relative Index: INVALID (ref 0.0–2.5)
Troponin I: 0.01 ng/mL (ref 0.00–0.06)

## 2011-01-03 LAB — URINE MICROSCOPIC-ADD ON

## 2011-01-03 LAB — BASIC METABOLIC PANEL
BUN: 5 mg/dL — ABNORMAL LOW (ref 6–23)
Calcium: 8.1 mg/dL — ABNORMAL LOW (ref 8.4–10.5)
Calcium: 8.5 mg/dL (ref 8.4–10.5)
Creatinine, Ser: 0.63 mg/dL (ref 0.4–1.2)
GFR calc Af Amer: >60 mL/min (ref >60)
GFR calc non Af Amer: >60 mL/min (ref >60)
GFR calc non Af Amer: >60 mL/min (ref >60)
Glucose, Bld: 120 mg/dL — ABNORMAL HIGH (ref 70–99)
Glucose, Bld: 59 mg/dL — ABNORMAL LOW (ref 70–99)
Sodium: 134 mEq/L — ABNORMAL LOW (ref 135–145)

## 2011-01-03 LAB — COMPREHENSIVE METABOLIC PANEL
BUN: 9 mg/dL (ref 6–23)
Calcium: 9.1 mg/dL (ref 8.4–10.5)
Creatinine, Ser: 0.62 mg/dL (ref 0.4–1.2)
Glucose, Bld: 202 mg/dL — ABNORMAL HIGH (ref 70–99)
Total Protein: 7.5 g/dL (ref 6.0–8.3)

## 2011-01-03 LAB — URINALYSIS, ROUTINE W REFLEX MICROSCOPIC
Glucose, UA: NEGATIVE mg/dL
Nitrite: NEGATIVE
Specific Gravity, Urine: 1.022 (ref 1.005–1.030)
pH: 5.5 (ref 5.0–8.0)

## 2011-01-03 LAB — GLUCOSE, CAPILLARY
Glucose-Capillary: 100 mg/dL — ABNORMAL HIGH (ref 70–99)
Glucose-Capillary: 110 mg/dL — ABNORMAL HIGH (ref 70–99)
Glucose-Capillary: 125 mg/dL — ABNORMAL HIGH (ref 70–99)
Glucose-Capillary: 131 mg/dL — ABNORMAL HIGH (ref 70–99)
Glucose-Capillary: 155 mg/dL — ABNORMAL HIGH (ref 70–99)
Glucose-Capillary: 199 mg/dL — ABNORMAL HIGH (ref 70–99)
Glucose-Capillary: 64 mg/dL — ABNORMAL LOW (ref 70–99)
Glucose-Capillary: 69 mg/dL — ABNORMAL LOW (ref 70–99)
Glucose-Capillary: 71 mg/dL (ref 70–99)
Glucose-Capillary: 89 mg/dL (ref 70–99)
Glucose-Capillary: 93 mg/dL (ref 70–99)

## 2011-01-03 LAB — CBC
HCT: 39.5 % (ref 36.0–46.0)
Hemoglobin: 12.9 g/dL (ref 12.0–15.0)
MCHC: 32.7 g/dL (ref 30.0–36.0)
RDW: 15.7 % — ABNORMAL HIGH (ref 11.5–15.5)

## 2011-01-03 LAB — URINE CULTURE: Colony Count: >=100000

## 2011-01-03 LAB — HEMOGLOBIN A1C
Hgb A1c MFr Bld: 6.9 % — ABNORMAL HIGH (ref <5.7)
Mean Plasma Glucose: 151 mg/dL — ABNORMAL HIGH (ref <117)

## 2011-01-03 LAB — POCT PREGNANCY, URINE: Preg Test, Ur: NEGATIVE

## 2011-01-04 LAB — URINE MICROSCOPIC-ADD ON

## 2011-01-04 LAB — CBC
HCT: 37.6 % (ref 36.0–46.0)
Hemoglobin: 12.6 g/dL (ref 12.0–15.0)
MCV: 81.9 fL (ref 78.0–100.0)
Platelets: 220 10*3/uL (ref 150–400)
RDW: 15.1 % (ref 11.5–15.5)

## 2011-01-04 LAB — DIFFERENTIAL
Basophils Absolute: 0 10*3/uL (ref 0.0–0.1)
Basophils Relative: 1 % (ref 0–1)
Lymphocytes Relative: 22 % (ref 12–46)
Monocytes Absolute: 0.5 10*3/uL (ref 0.1–1.0)
Monocytes Relative: 8 % (ref 3–12)
Neutro Abs: 3.8 10*3/uL (ref 1.7–7.7)
Neutrophils Relative %: 66 % (ref 43–77)

## 2011-01-04 LAB — COMPREHENSIVE METABOLIC PANEL
Albumin: 3.8 g/dL (ref 3.5–5.2)
BUN: 13 mg/dL (ref 6–23)
Creatinine, Ser: 0.7 mg/dL (ref 0.4–1.2)
Glucose, Bld: 214 mg/dL — ABNORMAL HIGH (ref 70–99)
Total Bilirubin: 0.3 mg/dL (ref 0.3–1.2)
Total Protein: 8.1 g/dL (ref 6.0–8.3)

## 2011-01-04 LAB — URINALYSIS, ROUTINE W REFLEX MICROSCOPIC
Ketones, ur: NEGATIVE mg/dL
Nitrite: POSITIVE — AB
Specific Gravity, Urine: 1.02 (ref 1.005–1.030)
pH: 5.5 (ref 5.0–8.0)

## 2011-01-04 LAB — URINE CULTURE: Colony Count: >=100000

## 2011-01-05 ENCOUNTER — Encounter: Payer: Self-pay | Admitting: Adult Health

## 2011-01-05 LAB — T-HELPER CELL (CD4) - (RCID CLINIC ONLY)
CD4 % Helper T Cell: 16 % — ABNORMAL LOW (ref 33–55)
CD4 T Cell Abs: 190 uL — ABNORMAL LOW (ref 400–2700)

## 2011-01-18 ENCOUNTER — Other Ambulatory Visit: Payer: Medicare Other

## 2011-01-21 LAB — BASIC METABOLIC PANEL
BUN: 7 mg/dL (ref 6–23)
CO2: 22 mEq/L (ref 19–32)
CO2: 23 mEq/L (ref 19–32)
Calcium: 8 mg/dL — ABNORMAL LOW (ref 8.4–10.5)
Chloride: 101 mEq/L (ref 96–112)
Chloride: 104 mEq/L (ref 96–112)
Chloride: 105 mEq/L (ref 96–112)
Creatinine, Ser: 0.61 mg/dL (ref 0.4–1.2)
GFR calc Af Amer: >60 mL/min (ref >60)
GFR calc Af Amer: >60 mL/min (ref >60)
GFR calc non Af Amer: >60 mL/min (ref >60)
Glucose, Bld: 257 mg/dL — ABNORMAL HIGH (ref 70–99)
Potassium: 3.7 mEq/L (ref 3.5–5.1)
Potassium: 3.8 mEq/L (ref 3.5–5.1)
Sodium: 132 mEq/L — ABNORMAL LOW (ref 135–145)
Sodium: 134 mEq/L — ABNORMAL LOW (ref 135–145)
Sodium: 135 mEq/L (ref 135–145)

## 2011-01-21 LAB — CBC
HCT: 34 % — ABNORMAL LOW (ref 36.0–46.0)
HCT: 34.2 % — ABNORMAL LOW (ref 36.0–46.0)
Hemoglobin: 11.2 g/dL — ABNORMAL LOW (ref 12.0–15.0)
Hemoglobin: 11.3 g/dL — ABNORMAL LOW (ref 12.0–15.0)
Hemoglobin: 11.3 g/dL — ABNORMAL LOW (ref 12.0–15.0)
MCHC: 33 g/dL (ref 30.0–36.0)
MCHC: 33.7 g/dL (ref 30.0–36.0)
MCV: 86.3 fL (ref 78.0–100.0)
MCV: 86.8 fL (ref 78.0–100.0)
MCV: 87.1 fL (ref 78.0–100.0)
Platelets: 184 10*3/uL (ref 150–400)
RBC: 3.88 MIL/uL (ref 3.87–5.11)
RBC: 3.92 MIL/uL (ref 3.87–5.11)
RBC: 3.94 MIL/uL (ref 3.87–5.11)
RDW: 13.6 % (ref 11.5–15.5)
WBC: 4.2 10*3/uL (ref 4.0–10.5)
WBC: 4.9 10*3/uL (ref 4.0–10.5)

## 2011-01-21 LAB — GLUCOSE, CAPILLARY
Glucose-Capillary: 155 mg/dL — ABNORMAL HIGH (ref 70–99)
Glucose-Capillary: 166 mg/dL — ABNORMAL HIGH (ref 70–99)
Glucose-Capillary: 190 mg/dL — ABNORMAL HIGH (ref 70–99)
Glucose-Capillary: 193 mg/dL — ABNORMAL HIGH (ref 70–99)

## 2011-01-21 LAB — CULTURE, BLOOD (ROUTINE X 2): Culture: NO GROWTH

## 2011-01-21 LAB — LEGIONELLA ANTIGEN, URINE: Legionella Antigen, Urine: NEGATIVE

## 2011-01-22 LAB — COMPREHENSIVE METABOLIC PANEL
ALT: 23 U/L (ref 0–35)
AST: 33 U/L (ref 0–37)
CO2: 23 mEq/L (ref 19–32)
Chloride: 102 mEq/L (ref 96–112)
GFR calc Af Amer: >60 mL/min (ref >60)
GFR calc non Af Amer: >60 mL/min (ref >60)
Glucose, Bld: 117 mg/dL — ABNORMAL HIGH (ref 70–99)
Sodium: 134 mEq/L — ABNORMAL LOW (ref 135–145)
Total Bilirubin: 0.5 mg/dL (ref 0.3–1.2)

## 2011-01-22 LAB — DIFFERENTIAL
Basophils Absolute: 0 10*3/uL (ref 0.0–0.1)
Basophils Relative: 0 % (ref 0–1)
Eosinophils Absolute: 0.1 10*3/uL (ref 0.0–0.7)
Eosinophils Relative: 2 % (ref 0–5)
Neutrophils Relative %: 78 % — ABNORMAL HIGH (ref 43–77)

## 2011-01-22 LAB — URINALYSIS, ROUTINE W REFLEX MICROSCOPIC
Glucose, UA: NEGATIVE mg/dL
Protein, ur: 100 mg/dL — AB
Specific Gravity, Urine: 1.017 (ref 1.005–1.030)
pH: 6 (ref 5.0–8.0)

## 2011-01-22 LAB — CBC
Hemoglobin: 12.8 g/dL (ref 12.0–15.0)
MCV: 86.5 fL (ref 78.0–100.0)
RBC: 4.47 MIL/uL (ref 3.87–5.11)
WBC: 7.6 10*3/uL (ref 4.0–10.5)

## 2011-01-22 LAB — URINE MICROSCOPIC-ADD ON

## 2011-01-22 LAB — URINE CULTURE

## 2011-01-22 LAB — POCT PREGNANCY, URINE: Preg Test, Ur: NEGATIVE

## 2011-01-22 LAB — LIPASE, BLOOD: Lipase: 14 U/L (ref 11–59)

## 2011-02-01 ENCOUNTER — Ambulatory Visit: Payer: Medicare Other | Admitting: Infectious Disease

## 2011-02-02 ENCOUNTER — Ambulatory Visit: Payer: Medicare Other | Admitting: Physical Therapy

## 2011-02-02 ENCOUNTER — Ambulatory Visit: Payer: Self-pay | Admitting: Physical Therapy

## 2011-02-05 ENCOUNTER — Ambulatory Visit: Payer: Medicare Other | Admitting: Family Medicine

## 2011-02-12 ENCOUNTER — Emergency Department (HOSPITAL_COMMUNITY)
Admission: EM | Admit: 2011-02-12 | Discharge: 2011-02-13 | Disposition: A | Discharge status: Home / Self Care | Payer: Medicare Other | Attending: Emergency Medicine | Admitting: Emergency Medicine

## 2011-02-12 DIAGNOSIS — E119 Type 2 diabetes mellitus without complications: Secondary | ICD-10-CM | POA: Insufficient documentation

## 2011-02-12 DIAGNOSIS — R112 Nausea with vomiting, unspecified: Secondary | ICD-10-CM | POA: Insufficient documentation

## 2011-02-12 DIAGNOSIS — R509 Fever, unspecified: Secondary | ICD-10-CM | POA: Insufficient documentation

## 2011-02-12 DIAGNOSIS — Z794 Long term (current) use of insulin: Secondary | ICD-10-CM | POA: Insufficient documentation

## 2011-02-12 DIAGNOSIS — I10 Essential (primary) hypertension: Secondary | ICD-10-CM | POA: Insufficient documentation

## 2011-02-12 DIAGNOSIS — N390 Urinary tract infection, site not specified: Secondary | ICD-10-CM | POA: Insufficient documentation

## 2011-02-12 DIAGNOSIS — G8220 Paraplegia, unspecified: Secondary | ICD-10-CM | POA: Insufficient documentation

## 2011-02-12 DIAGNOSIS — R109 Unspecified abdominal pain: Secondary | ICD-10-CM | POA: Insufficient documentation

## 2011-02-12 LAB — URINALYSIS, ROUTINE W REFLEX MICROSCOPIC
Bilirubin Urine: NEGATIVE
Glucose, UA: NEGATIVE mg/dL
Hgb urine dipstick: NEGATIVE
Ketones, ur: 40 mg/dL — AB
pH: 7 (ref 5.0–8.0)

## 2011-02-12 LAB — URINE MICROSCOPIC-ADD ON

## 2011-02-13 LAB — POCT I-STAT, CHEM 8
BUN: 4 mg/dL — ABNORMAL LOW (ref 6–23)
Chloride: 108 mEq/L (ref 96–112)
Potassium: 3.7 mEq/L (ref 3.5–5.1)
Sodium: 135 mEq/L (ref 135–145)
TCO2: 14 mmol/L (ref 0–100)

## 2011-02-13 LAB — DIFFERENTIAL
Basophils Relative: 1 % (ref 0–1)
Eosinophils Absolute: 0.1 10*3/uL (ref 0.0–0.7)
Lymphocytes Relative: 33 % (ref 12–46)
Monocytes Relative: 9 % (ref 3–12)
Neutrophils Relative %: 56 % (ref 43–77)

## 2011-02-13 LAB — BASIC METABOLIC PANEL
BUN: 6 mg/dL (ref 6–23)
Chloride: 105 mEq/L (ref 96–112)
GFR calc Af Amer: >60 mL/min (ref >60)
Potassium: 3.9 mEq/L (ref 3.5–5.1)

## 2011-02-13 LAB — CBC
MCV: 79 fL (ref 78.0–100.0)
RBC: 5.24 MIL/uL — ABNORMAL HIGH (ref 3.87–5.11)
WBC: 7.5 10*3/uL (ref 4.0–10.5)

## 2011-02-15 ENCOUNTER — Emergency Department (HOSPITAL_COMMUNITY)
Admission: EM | Admit: 2011-02-15 | Discharge: 2011-02-16 | Disposition: A | Discharge status: Home / Self Care | Payer: Medicare Other | Attending: Emergency Medicine | Admitting: Emergency Medicine

## 2011-02-15 DIAGNOSIS — G8220 Paraplegia, unspecified: Secondary | ICD-10-CM | POA: Insufficient documentation

## 2011-02-15 DIAGNOSIS — E119 Type 2 diabetes mellitus without complications: Secondary | ICD-10-CM | POA: Insufficient documentation

## 2011-02-15 DIAGNOSIS — Z7982 Long term (current) use of aspirin: Secondary | ICD-10-CM | POA: Insufficient documentation

## 2011-02-15 DIAGNOSIS — I10 Essential (primary) hypertension: Secondary | ICD-10-CM | POA: Insufficient documentation

## 2011-02-15 DIAGNOSIS — Z79899 Other long term (current) drug therapy: Secondary | ICD-10-CM | POA: Insufficient documentation

## 2011-02-15 DIAGNOSIS — R6883 Chills (without fever): Secondary | ICD-10-CM | POA: Insufficient documentation

## 2011-02-15 DIAGNOSIS — N390 Urinary tract infection, site not specified: Secondary | ICD-10-CM | POA: Insufficient documentation

## 2011-02-15 DIAGNOSIS — R112 Nausea with vomiting, unspecified: Secondary | ICD-10-CM | POA: Insufficient documentation

## 2011-02-15 DIAGNOSIS — R109 Unspecified abdominal pain: Secondary | ICD-10-CM | POA: Insufficient documentation

## 2011-02-15 LAB — BASIC METABOLIC PANEL
GFR calc non Af Amer: >60 mL/min (ref >60)
Potassium: 3.4 mEq/L — ABNORMAL LOW (ref 3.5–5.1)
Sodium: 136 mEq/L (ref 135–145)

## 2011-02-15 LAB — CBC
HCT: 38.7 % (ref 36.0–46.0)
Hemoglobin: 13.2 g/dL (ref 12.0–15.0)
MCH: 26.9 pg (ref 26.0–34.0)
MCHC: 34.1 g/dL (ref 30.0–36.0)
MCV: 79 fL (ref 78.0–100.0)
Platelets: 243 10*3/uL (ref 150–400)
RBC: 4.9 MIL/uL (ref 3.87–5.11)
RDW: 14.6 % (ref 11.5–15.5)
WBC: 7.1 K/uL (ref 4.0–10.5)

## 2011-02-15 LAB — DIFFERENTIAL
Basophils Absolute: 0 K/uL (ref 0.0–0.1)
Basophils Relative: 0 % (ref 0–1)
Eosinophils Absolute: 0.1 10*3/uL (ref 0.0–0.7)
Eosinophils Relative: 1 % (ref 0–5)
Lymphocytes Relative: 29 % (ref 12–46)
Lymphs Abs: 2 K/uL (ref 0.7–4.0)
Monocytes Absolute: 0.6 10*3/uL (ref 0.1–1.0)
Monocytes Relative: 8 % (ref 3–12)
Neutro Abs: 4.4 K/uL (ref 1.7–7.7)
Neutrophils Relative %: 62 % (ref 43–77)

## 2011-02-15 LAB — URINE CULTURE: Colony Count: >=100000

## 2011-02-15 LAB — BASIC METABOLIC PANEL WITH GFR
BUN: 5 mg/dL — ABNORMAL LOW (ref 6–23)
CO2: 22 meq/L (ref 19–32)
Calcium: 9.6 mg/dL (ref 8.4–10.5)
Chloride: 106 meq/L (ref 96–112)
Creatinine, Ser: 0.74 mg/dL (ref 0.4–1.2)
GFR calc Af Amer: >60 mL/min (ref >60)
Glucose, Bld: 98 mg/dL (ref 70–99)

## 2011-02-15 LAB — URINE MICROSCOPIC-ADD ON

## 2011-02-15 LAB — URINALYSIS, ROUTINE W REFLEX MICROSCOPIC
Bilirubin Urine: NEGATIVE
Glucose, UA: NEGATIVE mg/dL
Ketones, ur: NEGATIVE mg/dL
Nitrite: NEGATIVE
Protein, ur: >300 mg/dL — AB
Specific Gravity, Urine: 1.014 (ref 1.005–1.030)
Urobilinogen, UA: 0.2 mg/dL (ref 0.0–1.0)
pH: 6 (ref 5.0–8.0)

## 2011-02-16 LAB — URINE CULTURE
Colony Count: NO GROWTH
Culture  Setup Time: 201204302224
Culture: NO GROWTH

## 2011-02-17 ENCOUNTER — Ambulatory Visit: Payer: Medicare Other | Admitting: Obstetrics & Gynecology

## 2011-02-17 ENCOUNTER — Ambulatory Visit: Payer: Self-pay | Admitting: Infectious Disease

## 2011-02-22 ENCOUNTER — Ambulatory Visit: Payer: Medicare Other | Attending: Family Medicine | Admitting: Physical Therapy

## 2011-02-22 DIAGNOSIS — IMO0001 Reserved for inherently not codable concepts without codable children: Secondary | ICD-10-CM | POA: Insufficient documentation

## 2011-02-22 DIAGNOSIS — M6281 Muscle weakness (generalized): Secondary | ICD-10-CM | POA: Insufficient documentation

## 2011-02-23 ENCOUNTER — Emergency Department (HOSPITAL_COMMUNITY)
Admission: EM | Admit: 2011-02-23 | Discharge: 2011-02-23 | Disposition: A | Discharge status: Home / Self Care | Payer: Medicare Other | Attending: Emergency Medicine | Admitting: Emergency Medicine

## 2011-02-23 DIAGNOSIS — R51 Headache: Secondary | ICD-10-CM | POA: Insufficient documentation

## 2011-02-23 DIAGNOSIS — Z794 Long term (current) use of insulin: Secondary | ICD-10-CM | POA: Insufficient documentation

## 2011-02-23 DIAGNOSIS — IMO0001 Reserved for inherently not codable concepts without codable children: Secondary | ICD-10-CM | POA: Insufficient documentation

## 2011-02-23 DIAGNOSIS — I10 Essential (primary) hypertension: Secondary | ICD-10-CM | POA: Insufficient documentation

## 2011-02-23 DIAGNOSIS — R079 Chest pain, unspecified: Secondary | ICD-10-CM | POA: Insufficient documentation

## 2011-02-23 DIAGNOSIS — Z79899 Other long term (current) drug therapy: Secondary | ICD-10-CM | POA: Insufficient documentation

## 2011-02-23 DIAGNOSIS — Z7982 Long term (current) use of aspirin: Secondary | ICD-10-CM | POA: Insufficient documentation

## 2011-02-23 DIAGNOSIS — G8220 Paraplegia, unspecified: Secondary | ICD-10-CM | POA: Insufficient documentation

## 2011-02-23 DIAGNOSIS — E119 Type 2 diabetes mellitus without complications: Secondary | ICD-10-CM | POA: Insufficient documentation

## 2011-02-23 DIAGNOSIS — G8929 Other chronic pain: Secondary | ICD-10-CM | POA: Insufficient documentation

## 2011-02-23 DIAGNOSIS — Z21 Asymptomatic human immunodeficiency virus [HIV] infection status: Secondary | ICD-10-CM | POA: Insufficient documentation

## 2011-02-23 DIAGNOSIS — M549 Dorsalgia, unspecified: Secondary | ICD-10-CM | POA: Insufficient documentation

## 2011-02-23 LAB — POCT I-STAT 3, VENOUS BLOOD GAS (G3P V)
Acid-base deficit: 5 mmol/L — ABNORMAL HIGH (ref 0.0–2.0)
Bicarbonate: 21 mEq/L (ref 20.0–24.0)
O2 Saturation: 58 %
TCO2: 22 mmol/L (ref 0–100)
pO2, Ven: 33 mmHg (ref 30.0–45.0)

## 2011-02-23 LAB — POCT I-STAT, CHEM 8
Calcium, Ion: 1.1 mmol/L — ABNORMAL LOW (ref 1.12–1.32)
Chloride: 107 mEq/L (ref 96–112)
HCT: 44 % (ref 36.0–46.0)
Hemoglobin: 15 g/dL (ref 12.0–15.0)

## 2011-02-23 LAB — URINALYSIS, ROUTINE W REFLEX MICROSCOPIC
Ketones, ur: 40 mg/dL — AB
Leukocytes, UA: NEGATIVE
Nitrite: NEGATIVE
Protein, ur: >300 mg/dL — AB
Urobilinogen, UA: 0.2 mg/dL (ref 0.0–1.0)
pH: 7 (ref 5.0–8.0)

## 2011-02-23 LAB — URINE MICROSCOPIC-ADD ON
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
## 2011-03-02 NOTE — Consult Note (Signed)
NAME:  Ellen Lane, Ellen Lane              ACCOUNT NO.:  [id number]   MEDICAL RECORD NO.:  [id number]          PATIENT TYPE:  INP   LOCATION:  1425                         FACILITY:  [facility]   PHYSICIAN:  Ellen Lane, MDDATE OF BIRTH:  [birth year]   DATE OF CONSULTATION:  DATE OF DISCHARGE:                                 CONSULTATION   Ellen Lane is a 45 year old female paraplegic who had been complaining  of midupper abdominal pain since June.  The pain got worse on the 15th  of October and would not relieve by pain medication.  She was not able  to sleep.  She has been seen by Ellen Lane in our office for a history  of cholelithiasis and was being evaluated for uncontrolled hypertension  and diabetes prior to this.  She also describes some nausea and some  emesis associate with it.  Denies any fevers.  Also accompanied by some  loose stools, diarrhea previously.  Her blood sugars have also been very  elevated at home as well.  On admission here, she underwent an  evaluation with an ultrasound which showed cholelithiasis, no evidence  of cholecystitis, and a CT with mild to moderate left hydronephrosis and  a left hydroureter for which she has been evaluated by Ellen Lane of  urology.  She also has cholelithiasis, a normal appendix, and a right  ovarian complex cyst.  We were consulted for management for her  gallbladder.   PAST MEDICAL HISTORY:  1. Paraplegia.  2. Morbid obesity.  3. Hypertension.  4. Diabetes.  5. Obstructive sleep apnea.   PAST SURGICAL HISTORY:  1. Tracheostomy.  2. Two cesarean sections.   MEDICATIONS:  Humulin insulin.  She does not use this. Benazepril,  metformin, Lexapro, aspirin, multivitamin, cephalexin.   She is allergic to SULFA.   Does not smoke or drink.   PHYSICAL EXAMINATION:  VITAL SIGNS:  98.4, 90, 104/60, 18, 99 room air.  GENERAL:  A morbidly obese female in no distress.  ABDOMEN:  Morbidly obese.  Mild tenderness, right upper  quadrant  epigastrium.  No Murphy sign.  No peritoneal signs.   Her laboratory evaluation is a white blood cell count of 7, hematocrit  of 39.  Fingersticks have ranged 276-283 in the hospital.  BUN and  creatinine 14 and 1.09.  Glucose 294.  Hemoglobin A1c 10.9.  Lipase is  11.  LFTs were normal.  Her ultrasound and CT scan are described.   ASSESSMENT AND PLAN:  Chronic cholecystitis and symptomatic  cholelithiasis.  [name] certainly has symptoms from her gallbladder.  Ideally, her medical status would be much improved with relation to her  diabetes and her hypertension prior to surgery.  She is a difficult  operative candidate given her morbid obesity, but she certainly does  need her gallbladder addressed.  Additionally, she has had pain for  greater than 3 months now that she describes as continuous.  I think the  best plan would be percutaneously draining her gallbladder prior to  this, and a HIDA scan can be obtained prior to this to see if she  has  cholecystitis.  I would then plan on maximizing her medical  comorbidities to reduce her operative risks.      Ellen Gosling, MD  Electronically Signed     MCW/MEDQ  D:  08/03/2008  T:  08/04/2008  Job:  [id number]

## 2011-03-02 NOTE — Discharge Summary (Signed)
Ellen Lane, Ellen Lane              ACCOUNT NO.:  [id number]   MEDICAL RECORD NO.:  [id number]          PATIENT TYPE:  INP   LOCATION:  1340                         FACILITY:  [facility]   PHYSICIAN:  Almond Lint, MD       DATE OF BIRTH:  03-11-1966   DATE OF ADMISSION:  08/20/2008  DATE OF DISCHARGE:  08/24/2008                               DISCHARGE SUMMARY   PRINCIPAL DIAGNOSIS:  Symptomatic cholelithiasis.   SECONDARY DIAGNOSES:  1. Paraplegia.  2. Insulin-dependent diabetes.  3. Sleep apnea.  4. Human immunodeficiency virus.  5. Morbid obesity.  6. History of urinary tract infection.   PRINCIPAL PROCEDURE:  Laparoscopic cholecystectomy on August 21, 2008.   HOSPITAL COURSE:  Ms. Carriero is a 45 year old female who was admitted  following a lap-chole.  She is paraplegic and was going to require  physical therapy consult and increased pain control to be able to  transfer herself.  She did well from the standpoint of her diet with  some mild nausea over the first 24 hours.  She does self-  catheterizations at home and on postop day 2 her Foley catheter was  removed without incident.  She was fitted with a CPAP mask in-house  since she had a positive sleep study, but never got fitted.  She had a  fever on postop day 2 and this appeared to be atelectasis based on her  studies.  Thus these fevers improved as she did more aggressive  incentive spirometry and was out of bed.  She was discharged to home in  stable condition.   DISCHARGE MEDICATIONS:  Include:  1. Benazepril 5 mg once a day.  2. Metformin 1000 mg twice a day.  3. Lexapro 20 mg nightly.  4. Aspirin which was held and should be resumed.  5. Vicodin should be held while she is on Percocet.  6. Flomax 0.4 once a day.  7. Lantus 50 subcutaneous at 10 p.m.  8. NovoLog 10 units subcutaneous with meals.  9. NovoLog sliding scale with meals 24 units.  10.Multivitamin.  11.Percocet.   FOLLOW UP INSTRUCTIONS:  She was  advised that she can shower, but not to  soak the wounds for several weeks.  She should call us for fever or  chills, nausea, vomiting, diarrhea.      Almond Lint, MD  Electronically Signed     FB/MEDQ  D:  08/30/2008  T:  08/30/2008  Job:  [id number]
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
## 2011-03-02 NOTE — Op Note (Signed)
NAME:  Ellen Lane, Ellen Lane              ACCOUNT NO.:  [id number]   MEDICAL RECORD NO.:  [id number]          PATIENT TYPE:  OIB   LOCATION:  1340                         [facility]AC[facility]  [facility]   PHYSICIAN:  Almond Lint, MD       DATE OF BIRTH:  January 11, 1966   DATE OF PROCEDURE:  08/20/2008  DATE OF DISCHARGE:                               OPERATIVE REPORT   PREOPERATIVE DIAGNOSIS:  Symptomatic cholelithiasis.   POSTOPERATIVE DIAGNOSIS:  Symptomatic cholelithiasis.   PROCEDURE:  Laparoscopic cholecystectomy with intraoperative  cholangiogram.   SURGEON:  Almond Lint, MD.   ASSISTANTAngelia Mould. Derrell Lolling, MD.   ANESTHESIA:  General and local.   FINDINGS:  Intrahepatic gallbladder, good visualization of the right and  left hepatics as well as common bile duct filling to the duodenum on  cholangiogram with no filling defects.   SPECIMEN:  Gallbladder to pathology.   ESTIMATED BLOOD LOSS:  25 mL.   COMPLICATIONS:  None.   PROCEDURE IN DETAIL:  [name] was identified in the holding area and  taken to the operating room where she was placed supine on the operating  room table.  She was actually intubated easily with appropriate  positioning.  She was catheterized with Foley and her abdomen was then  prepped and draped in a sterile fashion.  Time-out was performed  according to the surgical safety check list.  Her ribcage was identified  and her umbilicus was deemed to be too far away from her ribcage to  perform a good dissection.  The skin was anesthetized approximately 4 cm  above the umbilicus and 3 cm lateral to the umbilicus.  The Optiview 10  mm port was used to obtain access to the abdomen and pneumoperitoneum  was achieved.  The camera was advanced in the abdomen and the patient  placed into reverse Trendelenburg and rotated to the left.  The  gallbladder was easily seen.  An epigastric port was placed under direct  visualization with extra long trocar after administration of  local.  This was noted to be too lateral so this was replaced more medially.  Two 5 mm ports were placed under direct visualization in right upper  quadrant.  A locking grasper was used to elevate the fundus of the  gallbladder superiorly and the infundibulum laterally.  The apparent  cystic duct was seen immediately.  The peritoneum was stripped from the  structure and opened up along the lateral medial border.  There was a  small vessel seen just adjacent to the duct with a node on  top of it.  The node was stripped down and this vessel was clipped twice toward the  patient's side, once towards the specimen side.  This way it allowed [place]  to dissect the cystic duct with a good critical view.   Cholangiogram catheter was placed through an Angiocath in the abdominal  wall.  One clip was placed on the specimen side of the cystic duct and a  ductotomy made with scissors.  The cholangiogram catheter was then  advanced into the ductotomy.  Clip was placed.  This clip was too  occlusive so it was removed and a second clip was placed.  This allowed  leakage so another clip was placed and there was easy flow of saline  into the duct yet no leakage on the outside.  The cholangiogram was shot  after the pneumoperitoneum was allowed to evacuate and the patient  placed flat.  Right and left hepatic ducts were seen as well as good  filling of common bile duct and duodenum with no filling defects.  Pneumoperitoneum was reachieved.  The patient placed back into  appropriate position and the cholangiogram catheter and clips were  removed.  The cystic duct was clipped three times on the patient's side  and then the duct was transected.  The artery had already been  transected.  The Bovie electrocautery was used to take the gallbladder  off the gallbladder fossa.  At one site at the very top of the  gallbladder the gallbladder was entered.  The gallbladder was then  placed into a bag and removed from the  umbilicus.  This required  dilation of the fascial incision with a Tresa Endo.  The port was replaced in  the umbilicus and the camera moved back here.  The gallbladder fossa was  then inspected and there were several areas of small amount of oozing.  These were coagulated.  The gallbladder fossa was irrigated copiously to  get rid of the bile that had spilled from the top of the gallbladder.  This cleared quickly.  The clips were reinspected and there was no  bleeding or bile leakage from the location of the clips.  The  gallbladder fossa was reinspected and there was no bleeding seen.  The  irrigant was aspirated from the abdomen.  Attention was then directed to  the periumbilical port.  An Endoclose was used to place two 0 Vicryl  sutures in order to close the fascial incision.  These were secured and  there was no additional fascial defect palpated.  The epigastric port  was also closed with the Endoclose and zero Vicryl suture.  The 5 mm  ports were used to allow the pneumoperitoneum to evacuate.  The ports  were removed and then the skin of all these incisions was then secured  with running 4-0 Monocryl.  The 10 mm port had to have the skin freed up  superiorly to avoid a big dimple.  The wounds were then cleaned, dried  and dressed with Dermabond.  The patient was awakened from anesthesia  and taken to the PACU in stable condition.      Almond Lint, MD  Electronically Signed     FB/MEDQ  D:  08/20/2008  T:  08/21/2008  Job:  [id number]
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
## 2011-03-05 NOTE — Procedures (Signed)
NAME:  Ellen Lane, Ellen Lane NO.:  [id number]   MEDICAL RECORD NO.:  [id number]          PATIENT TYPE:  OUT   LOCATION:  SLEEP CENTER                 FACILITY:  [facility]   PHYSICIAN:  Clinton D. Maple Hudson, M.D. DATE OF BIRTH:  10-Mar-1966   DATE OF ADMISSION:  05/31/2004  DATE OF DISCHARGE:  05/31/2004                              NOCTURNAL POLYSOMNOGRAM   REFERRING PHYSICIAN:  Dr. Danna Hefty   INDICATION FOR STUDY:  Hypersomnia with sleep apnea.  Paraplegic, using  mobility chair and doing self-catheterization.   EPWORTH SLEEPINESS SCORE:  5/24   BODY MASS INDEX:  54   WEIGHT:  300 pounds   MEDICATIONS LISTED:  Novolog, metformin, Avandia.   SLEEP ARCHITECTURE:  Total sleep time 323 minutes with sleep efficiency 73%.  Stage I was 9%, Stage II was 69%, Stages III and IV 3%, REM was 20% of total  sleep time.  Latency to sleep onset 36 minutes, latency to REM 156 minutes,  awake after sleep onset 59 minutes, arousal index 20/hr.   RESPIRATORY DATA:  Moderate obstructive sleep apnea/hypopnea syndrome, RDI  29/hr.  This included 72 obstructive hypopneas, 85 obstructive apneas.  Events were not positional.  REM RDI was 121/hr.  Technician could not use  split-study protocol because event developed too late in the night to permit  time for CPAP titration.  Consider return for CPAP titration.   OXYGEN DATA:  Mild to moderate snoring with intervals of moderate to severe  oxygen desaturation associated with clusters of apneas.  Oxygen nadir was  75%.  Mean oxygen saturation through the study was 94-97%.   CARDIAC DATA:  Normal sinus rhythm.   MOVEMENT/PARASOMNIA:  Forty-five limb jerks were recorded of which 12 were  associated with arousal or awakening for a periodic limb movement with  arousal index of 2.2/hr.   IMPRESSION/RECOMMENDATION:  Moderate obstructive sleep apnea/hypopnea  syndrome, respiratory disturbance index 29/hr.  Consider return for  continuous  positive airway pressure titration.  Moderate to severe  oxygen desaturation during clusters of apneas to 75%.  Normal cardiac  rhythm.  Periodic limb movement with arousal.                                   ______________________________                                Rennis Chris. Maple Hudson, M.D.                                Diplomate, American Board of Sleep Medicine    CDY/MEDQ  D:  06/07/2004 10:31:50  T:  06/07/2004 19:18:11  Job:  [id number]

## 2011-03-05 NOTE — Group Therapy Note (Signed)
NAME:  Ellen Lane, Ellen Lane               ACCOUNT NO.:  [id number]   MEDICAL RECORD NO.:  [id number]          PATIENT TYPE:  WOC   LOCATION:  [facility]                   FACILITY:  [facility]   PHYSICIAN:  Tinnie Gens, MD        DATE OF BIRTH:  06-17-1966   DATE OF SERVICE:                                    CLINIC NOTE   HISTORY OF PRESENT ILLNESS:  The patient is a 45 year old paraplegic who has  been seen in this clinic on numerous occasions previously who is here for  her annual pelvic exam.  The patient is without significant complaints  today.   PAST MEDICAL HISTORY:  Significant for urinary incontinence, bladder spasms,  frequent UTIs, diabetes mellitus, kidney problems.   PAST SURGICAL HISTORY:  Significant for C section x2.   MEDICATIONS:  Novolin, Avandamet, and Pepcid.   ALLERGIES:  To sulfa.   OBSTETRICAL HISTORY:  She is a G3, P2, two previous C sections, 1  miscarriage.   GYNECOLOGICAL HISTORY:  Menarche at age 33.  Cycles are regular.  Medium  flow.   FAMILY HISTORY:  Diabetes, coronary artery disease, hypertension.   SOCIAL HISTORY:  No tobacco, alcohol or drug use.  She is a paraplegic.   REVIEW OF SYSTEMS:  A 14-point review of systems was reviewed, and is  negative.   PHYSICAL EXAMINATION:  VITAL SIGNS:  She is unable to be weighed.  Her blood  pressure today is 130/78.  GENERAL:  She is an obese black female in no acute distress.  NECK:  Supple.  ABDOMEN:  Soft, obese, and nontender.  GU:  She has normal external female genitalia.  The vagina is pink and  rugated.  The cervix is nulliparous and without lesion.  The uterus and  adnexa cannot be well established secondary to patient's body habitus.  BUS  was within normal limits.   IMPRESSION:  Annual exam.   PLAN:  Pap smear today.  Follow up in 2 years or if needed by her next exam.  She is to start annual mammography at age 3.           ______________________________  Tinnie Gens, MD     TP/MEDQ   D:  08/13/2005  T:  08/13/2005  Job:  [id number]

## 2011-03-05 NOTE — Group Therapy Note (Signed)
   NAME:  Ellen Lane, Ellen Lane                              ACCOUNT NO.:  n/a   MEDICAL RECORD NO.:  [id number]                    PATIENT TYPE:   LOCATION:  [facility]                           FACILITY:   PHYSICIAN:  Ellis Parents, MD                 DATE OF BIRTH:   DATE OF SERVICE:  07/12/2003                                    CLINIC NOTE   CHIEF COMPLAINT:  This 45 year old paraplegic female comes in for a routine  Pap smear.  The patient also states that she has urine incontinence and  spasms.  She self-catheterizes herself on a q.5h. basis.  She has more  incontinence for the hour to two hours prior to catheterization.  The  patient's menses are basically every eight weeks.   PHYSICAL EXAMINATION:  The vagina is clean, the cervix is nulliparous and  clean.  The uterus and pelvic structures cannot be determined because of  obesity.  Pap smear is taken.   The patient is advised to self-catheterize on three-hour basis and this may  prevent her incontinence.  A catheterized urine is obtained today for  culture.                                               Ellis Parents, MD    SA/MEDQ  D:  07/12/2003  T:  07/12/2003  Job:  [id number]

## 2011-03-31 ENCOUNTER — Ambulatory Visit: Payer: Medicare Other | Admitting: Obstetrics & Gynecology

## 2011-03-31 ENCOUNTER — Other Ambulatory Visit: Payer: Self-pay | Admitting: Obstetrics & Gynecology

## 2011-03-31 DIAGNOSIS — B20 Human immunodeficiency virus [HIV] disease: Secondary | ICD-10-CM

## 2011-03-31 DIAGNOSIS — R87612 Low grade squamous intraepithelial lesion on cytologic smear of cervix (LGSIL): Secondary | ICD-10-CM

## 2011-04-05 ENCOUNTER — Telehealth (INDEPENDENT_AMBULATORY_CARE_PROVIDER_SITE_OTHER): Payer: Medicare Other | Admitting: *Deleted

## 2011-04-05 DIAGNOSIS — B20 Human immunodeficiency virus [HIV] disease: Secondary | ICD-10-CM

## 2011-04-05 DIAGNOSIS — Z113 Encounter for screening for infections with a predominantly sexual mode of transmission: Secondary | ICD-10-CM

## 2011-04-05 DIAGNOSIS — Z21 Asymptomatic human immunodeficiency virus [HIV] infection status: Secondary | ICD-10-CM

## 2011-04-05 DIAGNOSIS — Z79899 Other long term (current) drug therapy: Secondary | ICD-10-CM

## 2011-04-05 MED ORDER — EFAVIRENZ-EMTRICITAB-TENOFOVIR 600-200-300 MG PO TABS: 1.0000 | ORAL_TABLET | Freq: Every day | ORAL | Status: DC

## 2011-04-05 NOTE — Telephone Encounter (Signed)
RN spoke with pt.   Arranged for appts and refill if HIV rx.  Jennet Maduro, RN

## 2011-04-22 ENCOUNTER — Other Ambulatory Visit (INDEPENDENT_AMBULATORY_CARE_PROVIDER_SITE_OTHER): Payer: Medicare Other

## 2011-04-22 DIAGNOSIS — B20 Human immunodeficiency virus [HIV] disease: Secondary | ICD-10-CM

## 2011-04-23 LAB — CBC WITH DIFFERENTIAL/PLATELET
Basophils Absolute: 0 10*3/uL (ref 0.0–0.1)
Eosinophils Absolute: 0.1 10*3/uL (ref 0.0–0.7)
Eosinophils Relative: 1 % (ref 0–5)
HCT: 37.1 % (ref 36.0–46.0)
MCH: 26.3 pg (ref 26.0–34.0)
MCHC: 32.3 g/dL (ref 30.0–36.0)
MCV: 81.4 fL (ref 78.0–100.0)
Monocytes Absolute: 0.3 10*3/uL (ref 0.1–1.0)
Platelets: 225 10*3/uL (ref 150–400)
RDW: 16.3 % — ABNORMAL HIGH (ref 11.5–15.5)

## 2011-04-23 LAB — COMPLETE METABOLIC PANEL WITH GFR
ALT: 11 U/L (ref 0–35)
AST: 10 U/L (ref 0–37)
BUN: 17 mg/dL (ref 6–23)
CO2: 25 mEq/L (ref 19–32)
Calcium: 10.2 mg/dL (ref 8.4–10.5)
Creat: 0.67 mg/dL (ref 0.50–1.10)
GFR, Est African American: >60 mL/min (ref >60)
Total Bilirubin: 0.2 mg/dL — ABNORMAL LOW (ref 0.3–1.2)

## 2011-04-23 LAB — HIV-1 RNA QUANT-NO REFLEX-BLD: HIV 1 RNA Quant: <20 copies/mL (ref <20)

## 2011-04-23 LAB — T-HELPER CELL (CD4) - (RCID CLINIC ONLY): CD4 % Helper T Cell: 26 % — ABNORMAL LOW (ref 33–55)

## 2011-04-23 LAB — HEPATITIS C ANTIBODY: HCV Ab: NEGATIVE

## 2011-04-23 LAB — HEPATITIS B SURFACE ANTIBODY,QUALITATIVE: Hep B S Ab: NEGATIVE

## 2011-05-05 ENCOUNTER — Ambulatory Visit: Payer: Medicare Other | Admitting: Infectious Disease

## 2011-05-07 ENCOUNTER — Ambulatory Visit: Payer: Medicare Other | Admitting: Infectious Disease

## 2011-05-17 ENCOUNTER — Ambulatory Visit: Payer: Medicare Other | Admitting: Infectious Disease

## 2011-05-17 ENCOUNTER — Telehealth: Payer: Self-pay | Admitting: *Deleted

## 2011-05-17 NOTE — Telephone Encounter (Signed)
Patient called to reschedule appointment she was patched to the front desk to do so. Ellen Lane

## 2011-06-02 ENCOUNTER — Ambulatory Visit: Payer: Medicare Other | Admitting: Infectious Disease

## 2011-06-15 ENCOUNTER — Other Ambulatory Visit: Payer: Self-pay | Admitting: Internal Medicine

## 2011-06-15 DIAGNOSIS — Z1231 Encounter for screening mammogram for malignant neoplasm of breast: Secondary | ICD-10-CM

## 2011-06-25 ENCOUNTER — Ambulatory Visit (HOSPITAL_COMMUNITY): Payer: Medicare Other

## 2011-06-30 ENCOUNTER — Ambulatory Visit (HOSPITAL_COMMUNITY): Payer: Medicare Other

## 2011-07-14 LAB — WOUND CULTURE

## 2011-07-15 LAB — DIFFERENTIAL
Basophils Relative: 0
Eosinophils Absolute: 0.3
Lymphs Abs: 1.8
Neutro Abs: 2.8
Neutrophils Relative %: 52

## 2011-07-15 LAB — CBC
HCT: 43.5
Hemoglobin: 14.4
MCHC: 33.1
MCV: 80.8
RBC: 5.39 — ABNORMAL HIGH
WBC: 5.4

## 2011-07-15 LAB — COMPREHENSIVE METABOLIC PANEL
ALT: 17
Alkaline Phosphatase: 62
BUN: 13
CO2: 23
Calcium: 9.5
GFR calc non Af Amer: >60
Glucose, Bld: 147 — ABNORMAL HIGH
Sodium: 135
Total Protein: 7.8

## 2011-07-15 LAB — URINALYSIS, ROUTINE W REFLEX MICROSCOPIC
Nitrite: NEGATIVE
Protein, ur: 100 — AB
Specific Gravity, Urine: 1.027
Urobilinogen, UA: 0.2

## 2011-07-15 LAB — URINE CULTURE

## 2011-07-15 LAB — URINE MICROSCOPIC-ADD ON

## 2011-07-15 LAB — LIPASE, BLOOD: Lipase: 11

## 2011-07-19 LAB — URINALYSIS, ROUTINE W REFLEX MICROSCOPIC
Glucose, UA: 250 — AB
Ketones, ur: 40 — AB
Ketones, ur: 40 — AB
Protein, ur: 100 — AB
Specific Gravity, Urine: 1.03
Urobilinogen, UA: 1
Urobilinogen, UA: 1

## 2011-07-19 LAB — CBC
HCT: 34.7 — ABNORMAL LOW
HCT: 41.3
Hemoglobin: 11.2 — ABNORMAL LOW
Hemoglobin: 11.3 — ABNORMAL LOW
Hemoglobin: 13
Hemoglobin: 13.6
MCHC: 33.1
MCHC: 33.2
MCV: 83.7
MCV: 83.8
MCV: 85.6
Platelets: 125 — ABNORMAL LOW
Platelets: 207
RBC: 4.05
RBC: 4.66
RBC: 4.93
RDW: 14.8
WBC: 3 — ABNORMAL LOW
WBC: 3.7 — ABNORMAL LOW
WBC: 4

## 2011-07-19 LAB — BASIC METABOLIC PANEL
BUN: 7
Calcium: 8.2 — ABNORMAL LOW
Calcium: 8.5
Chloride: 104
GFR calc Af Amer: >60
GFR calc Af Amer: >60
GFR calc non Af Amer: >60
GFR calc non Af Amer: >60
GFR calc non Af Amer: >60
Glucose, Bld: 269 — ABNORMAL HIGH
Glucose, Bld: 270 — ABNORMAL HIGH
Potassium: 3.5
Potassium: 3.8
Sodium: 131 — ABNORMAL LOW
Sodium: 134 — ABNORMAL LOW
Sodium: 135

## 2011-07-19 LAB — DIFFERENTIAL
Basophils Relative: 0
Eosinophils Absolute: 0
Eosinophils Relative: 0
Eosinophils Relative: 6 — ABNORMAL HIGH
Lymphocytes Relative: 31
Lymphs Abs: 1.3
Monocytes Absolute: 0.5
Monocytes Absolute: 0.8
Monocytes Relative: 10
Monocytes Relative: 11
Neutro Abs: 6.1

## 2011-07-19 LAB — GLUCOSE, CAPILLARY
Glucose-Capillary: 193 — ABNORMAL HIGH
Glucose-Capillary: 211 — ABNORMAL HIGH
Glucose-Capillary: 211 — ABNORMAL HIGH
Glucose-Capillary: 228 — ABNORMAL HIGH
Glucose-Capillary: 241 — ABNORMAL HIGH
Glucose-Capillary: 248 — ABNORMAL HIGH
Glucose-Capillary: 256 — ABNORMAL HIGH
Glucose-Capillary: 256 — ABNORMAL HIGH
Glucose-Capillary: 267 — ABNORMAL HIGH
Glucose-Capillary: 276 — ABNORMAL HIGH
Glucose-Capillary: 280 — ABNORMAL HIGH
Glucose-Capillary: 286 — ABNORMAL HIGH
Glucose-Capillary: 288 — ABNORMAL HIGH
Glucose-Capillary: 300 — ABNORMAL HIGH
Glucose-Capillary: 311 — ABNORMAL HIGH
Glucose-Capillary: 312 — ABNORMAL HIGH
Glucose-Capillary: 331 — ABNORMAL HIGH
Glucose-Capillary: 332 — ABNORMAL HIGH
Glucose-Capillary: 334 — ABNORMAL HIGH
Glucose-Capillary: 371 — ABNORMAL HIGH
Glucose-Capillary: 384 — ABNORMAL HIGH

## 2011-07-19 LAB — COMPREHENSIVE METABOLIC PANEL
ALT: 14
ALT: 17
AST: 21
AST: 21
Albumin: 3 — ABNORMAL LOW
Albumin: 3.2 — ABNORMAL LOW
Alkaline Phosphatase: 57
Alkaline Phosphatase: 61
CO2: 19
CO2: 22
Calcium: 9.1
Chloride: 99
Creatinine, Ser: 0.79
GFR calc Af Amer: >60
GFR calc Af Amer: >60
GFR calc non Af Amer: 56 — ABNORMAL LOW
Glucose, Bld: 294 — ABNORMAL HIGH
Potassium: 4.1
Potassium: 4.5
Sodium: 134 — ABNORMAL LOW
Total Bilirubin: 1.8 — ABNORMAL HIGH

## 2011-07-19 LAB — HIV-1 RNA ULTRAQUANT REFLEX TO GENTYP+
HIV 1 RNA Quant: 89000 copies/mL — ABNORMAL HIGH (ref <50)
HIV-1 RNA Quant, Log: 4.95 — ABNORMAL HIGH (ref <1.70)

## 2011-07-19 LAB — URINE CULTURE
Colony Count: NO GROWTH
Culture: NO GROWTH
Special Requests: NEGATIVE

## 2011-07-19 LAB — CULTURE, BLOOD (ROUTINE X 2): Culture: NO GROWTH

## 2011-07-19 LAB — URINE MICROSCOPIC-ADD ON

## 2011-07-19 LAB — APTT: aPTT: 25

## 2011-07-19 LAB — HEMOGLOBIN A1C: Hgb A1c MFr Bld: 10.9 — ABNORMAL HIGH

## 2011-07-19 LAB — HIV-1 GENOTYPR PLUS

## 2011-07-19 LAB — PREGNANCY, URINE: Preg Test, Ur: NEGATIVE

## 2011-07-19 LAB — PROTIME-INR: INR: 1

## 2011-07-20 LAB — GLUCOSE, CAPILLARY
Glucose-Capillary: 132 — ABNORMAL HIGH
Glucose-Capillary: 136 — ABNORMAL HIGH
Glucose-Capillary: 152 — ABNORMAL HIGH
Glucose-Capillary: 152 — ABNORMAL HIGH
Glucose-Capillary: 157 — ABNORMAL HIGH
Glucose-Capillary: 165 — ABNORMAL HIGH
Glucose-Capillary: 171 — ABNORMAL HIGH
Glucose-Capillary: 186 — ABNORMAL HIGH
Glucose-Capillary: 209 — ABNORMAL HIGH
Glucose-Capillary: 217 — ABNORMAL HIGH
Glucose-Capillary: 226 — ABNORMAL HIGH
Glucose-Capillary: 260 — ABNORMAL HIGH
Glucose-Capillary: 296 — ABNORMAL HIGH

## 2011-07-20 LAB — COMPREHENSIVE METABOLIC PANEL
ALT: 20
ALT: 26
AST: 16
AST: 20
Albumin: 2.2 — ABNORMAL LOW
Albumin: 2.3 — ABNORMAL LOW
Albumin: 3.2 — ABNORMAL LOW
Alkaline Phosphatase: 59
Alkaline Phosphatase: 60
Alkaline Phosphatase: 64
CO2: 22
Chloride: 102
Chloride: 105
Creatinine, Ser: 0.65
Creatinine, Ser: 0.91
GFR calc Af Amer: >60
GFR calc Af Amer: >60
GFR calc non Af Amer: >60
Glucose, Bld: 248 — ABNORMAL HIGH
Potassium: 4
Potassium: 4.2
Potassium: 4.5
Sodium: 137
Total Bilirubin: 0.6
Total Bilirubin: 0.7
Total Protein: 5.8 — ABNORMAL LOW
Total Protein: 5.9 — ABNORMAL LOW

## 2011-07-20 LAB — URINALYSIS, ROUTINE W REFLEX MICROSCOPIC
Leukocytes, UA: NEGATIVE
Nitrite: NEGATIVE
Specific Gravity, Urine: 1.013
pH: 6.5

## 2011-07-20 LAB — CBC
HCT: 36.1
Hemoglobin: 10.8 — ABNORMAL LOW
MCHC: 32.7
MCV: 83.3
MCV: 84
Platelets: 133 — ABNORMAL LOW
RBC: 4.73
RDW: 14.8
RDW: 15
WBC: 4.8

## 2011-07-20 LAB — DIFFERENTIAL
Basophils Absolute: 0
Basophils Relative: 1
Eosinophils Absolute: 0.1
Eosinophils Relative: 2
Lymphocytes Relative: 23
Monocytes Absolute: 0.6

## 2011-07-20 LAB — URINE MICROSCOPIC-ADD ON

## 2011-07-20 LAB — URINE CULTURE: Colony Count: >=100000

## 2011-07-20 LAB — BASIC METABOLIC PANEL
BUN: 14
Chloride: 101
Glucose, Bld: 179 — ABNORMAL HIGH
Potassium: 3.6

## 2011-07-20 LAB — CULTURE, BLOOD (ROUTINE X 2): Culture: NO GROWTH

## 2011-07-20 LAB — LIPASE, BLOOD: Lipase: 16

## 2011-07-26 ENCOUNTER — Ambulatory Visit (HOSPITAL_COMMUNITY)
Admission: RE | Admit: 2011-07-26 | Discharge: 2011-07-26 | Disposition: A | Discharge status: Home / Self Care | Payer: Medicare Other | Source: Ambulatory Visit | Attending: Internal Medicine | Admitting: Internal Medicine

## 2011-07-26 DIAGNOSIS — Z1231 Encounter for screening mammogram for malignant neoplasm of breast: Secondary | ICD-10-CM | POA: Insufficient documentation

## 2011-07-27 ENCOUNTER — Other Ambulatory Visit (INDEPENDENT_AMBULATORY_CARE_PROVIDER_SITE_OTHER): Payer: Medicare Other

## 2011-07-27 ENCOUNTER — Other Ambulatory Visit: Payer: Self-pay

## 2011-07-27 ENCOUNTER — Other Ambulatory Visit: Payer: Self-pay | Admitting: Infectious Disease

## 2011-07-27 DIAGNOSIS — B20 Human immunodeficiency virus [HIV] disease: Secondary | ICD-10-CM

## 2011-07-27 LAB — BASIC METABOLIC PANEL
BUN: 10
CO2: 24
Chloride: 108
Creatinine, Ser: 0.7
Glucose, Bld: 256 — ABNORMAL HIGH
Potassium: 4.2

## 2011-07-27 LAB — CBC WITH DIFFERENTIAL/PLATELET
Basophils Absolute: 0 10*3/uL (ref 0.0–0.1)
Eosinophils Absolute: 0 10*3/uL (ref 0.0–0.7)
Eosinophils Relative: 1 % (ref 0–5)
HCT: 39.6 % (ref 36.0–46.0)
Lymphocytes Relative: 28 % (ref 12–46)
MCH: 27 pg (ref 26.0–34.0)
MCV: 80.3 fL (ref 78.0–100.0)
Monocytes Absolute: 0.5 10*3/uL (ref 0.1–1.0)
Platelets: 231 10*3/uL (ref 150–400)
RDW: 14.9 % (ref 11.5–15.5)
WBC: 6.2 10*3/uL (ref 4.0–10.5)

## 2011-07-27 LAB — COMPLETE METABOLIC PANEL WITH GFR
ALT: 11 U/L (ref 0–35)
BUN: 14 mg/dL (ref 6–23)
CO2: 22 mEq/L (ref 19–32)
Calcium: 9.3 mg/dL (ref 8.4–10.5)
Chloride: 105 mEq/L (ref 96–112)
Creat: 0.67 mg/dL (ref 0.50–1.10)
GFR, Est African American: >60 mL/min (ref >60)
Total Bilirubin: 0.2 mg/dL — ABNORMAL LOW (ref 0.3–1.2)

## 2011-07-28 LAB — HIV-1 RNA QUANT-NO REFLEX-BLD
HIV 1 RNA Quant: <20 copies/mL (ref <20)
HIV-1 RNA Quant, Log: <1.3 {Log} (ref <1.30)

## 2011-07-28 LAB — T-HELPER CELL (CD4) - (RCID CLINIC ONLY): CD4 T Cell Abs: 450 uL (ref 400–2700)

## 2011-07-29 LAB — CULTURE, ROUTINE-ABSCESS

## 2011-08-09 ENCOUNTER — Ambulatory Visit (INDEPENDENT_AMBULATORY_CARE_PROVIDER_SITE_OTHER): Payer: Medicare Other | Admitting: Infectious Disease

## 2011-08-09 ENCOUNTER — Encounter: Payer: Self-pay | Admitting: Infectious Disease

## 2011-08-09 VITALS — BP 140/84 | HR 90 | Temp 98.0°F

## 2011-08-09 DIAGNOSIS — Z21 Asymptomatic human immunodeficiency virus [HIV] infection status: Secondary | ICD-10-CM

## 2011-08-09 DIAGNOSIS — Z23 Encounter for immunization: Secondary | ICD-10-CM

## 2011-08-09 DIAGNOSIS — I10 Essential (primary) hypertension: Secondary | ICD-10-CM | POA: Insufficient documentation

## 2011-08-09 DIAGNOSIS — W3400XA Accidental discharge from unspecified firearms or gun, initial encounter: Secondary | ICD-10-CM | POA: Insufficient documentation

## 2011-08-09 DIAGNOSIS — B20 Human immunodeficiency virus [HIV] disease: Secondary | ICD-10-CM

## 2011-08-09 DIAGNOSIS — R61 Generalized hyperhidrosis: Secondary | ICD-10-CM | POA: Insufficient documentation

## 2011-08-09 DIAGNOSIS — E119 Type 2 diabetes mellitus without complications: Secondary | ICD-10-CM | POA: Insufficient documentation

## 2011-08-09 DIAGNOSIS — S21339A Puncture wound without foreign body of unspecified front wall of thorax with penetration into thoracic cavity, initial encounter: Secondary | ICD-10-CM | POA: Insufficient documentation

## 2011-08-09 MED ORDER — LISINOPRIL 5 MG PO TABS: 5.0000 mg | ORAL_TABLET | Freq: Every day | ORAL | Status: DC

## 2011-08-09 MED ORDER — EFAVIRENZ-EMTRICITAB-TENOFOVIR 600-200-300 MG PO TABS: 1.0000 | ORAL_TABLET | Freq: Every day | ORAL | Status: DC
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
## 2011-08-09 NOTE — Assessment & Plan Note (Signed)
BP up today. She is on low dose ACE, asked to increase ACE and have her BMP checked with Dr. Philipp Deputy in a few days

## 2011-08-09 NOTE — Assessment & Plan Note (Signed)
Pt had tubal ligation but still has ovaries, would consider checking fsh, lh and also checkign tsh, t4.not much to suggest active infection

## 2011-08-09 NOTE — Assessment & Plan Note (Signed)
Continue atripla 

## 2011-08-23 ENCOUNTER — Other Ambulatory Visit: Payer: Self-pay | Admitting: *Deleted

## 2011-08-23 DIAGNOSIS — E109 Type 1 diabetes mellitus without complications: Secondary | ICD-10-CM

## 2011-08-23 MED ORDER — ONETOUCH DELICA LANCETS MISC: 1.0000 | Freq: Four times a day (QID) | Status: DC

## 2011-12-07 ENCOUNTER — Encounter: Payer: Medicare Other | Attending: Physical Medicine & Rehabilitation | Admitting: Physical Medicine & Rehabilitation

## 2011-12-14 ENCOUNTER — Telehealth: Payer: Self-pay | Admitting: Licensed Clinical Social Worker

## 2011-12-14 NOTE — Telephone Encounter (Signed)
Patient called stating that she has increased sweating and fatigue. She has an appointment on 01/03/2012 with Dr. Daiva Eves. I encouraged her to keep that appointment to discuss her symptoms. She stated that her periods are not regular and skips months. She thinks that maybe her symptoms are from pre menopause. I also suggested that she should follow up with her Gynecologist at [facility], and she agreed.

## 2011-12-20 ENCOUNTER — Other Ambulatory Visit: Payer: Medicare Other

## 2011-12-21 ENCOUNTER — Other Ambulatory Visit (INDEPENDENT_AMBULATORY_CARE_PROVIDER_SITE_OTHER): Payer: Medicare Other

## 2011-12-21 DIAGNOSIS — B20 Human immunodeficiency virus [HIV] disease: Secondary | ICD-10-CM

## 2011-12-21 LAB — COMPLETE METABOLIC PANEL WITH GFR
ALT: 12 U/L (ref 0–35)
AST: 9 U/L (ref 0–37)
Albumin: 4 g/dL (ref 3.5–5.2)
Alkaline Phosphatase: 102 U/L (ref 39–117)
Calcium: 9.7 mg/dL (ref 8.4–10.5)
Chloride: 105 mEq/L (ref 96–112)
Potassium: 4.7 mEq/L (ref 3.5–5.3)
Sodium: 139 mEq/L (ref 135–145)
Total Protein: 7 g/dL (ref 6.0–8.3)

## 2011-12-21 LAB — CBC WITH DIFFERENTIAL/PLATELET
Basophils Absolute: 0 10*3/uL (ref 0.0–0.1)
Eosinophils Absolute: 0 10*3/uL (ref 0.0–0.7)
Eosinophils Relative: 1 % (ref 0–5)
HCT: 38.1 % (ref 36.0–46.0)
Lymphocytes Relative: 29 % (ref 12–46)
MCH: 26.9 pg (ref 26.0–34.0)
MCHC: 32.8 g/dL (ref 30.0–36.0)
MCV: 82.1 fL (ref 78.0–100.0)
Monocytes Absolute: 0.5 10*3/uL (ref 0.1–1.0)
RDW: 13.7 % (ref 11.5–15.5)
WBC: 5.4 10*3/uL (ref 4.0–10.5)

## 2011-12-23 LAB — HIV-1 RNA QUANT-NO REFLEX-BLD
HIV 1 RNA Quant: <20 copies/mL (ref <20)
HIV-1 RNA Quant, Log: <1.3 {Log} (ref <1.30)

## 2012-01-03 ENCOUNTER — Ambulatory Visit: Payer: Medicare Other | Admitting: Infectious Disease

## 2012-03-02 ENCOUNTER — Ambulatory Visit: Payer: Medicare Other | Admitting: Infectious Disease

## 2012-03-03 DIAGNOSIS — Z87828 Personal history of other (healed) physical injury and trauma: Secondary | ICD-10-CM | POA: Insufficient documentation

## 2012-03-03 DIAGNOSIS — G43909 Migraine, unspecified, not intractable, without status migrainosus: Secondary | ICD-10-CM | POA: Insufficient documentation

## 2012-03-03 DIAGNOSIS — B20 Human immunodeficiency virus [HIV] disease: Secondary | ICD-10-CM | POA: Insufficient documentation

## 2012-03-03 DIAGNOSIS — E669 Obesity, unspecified: Secondary | ICD-10-CM | POA: Insufficient documentation

## 2012-03-03 DIAGNOSIS — G8929 Other chronic pain: Secondary | ICD-10-CM | POA: Insufficient documentation

## 2012-03-03 DIAGNOSIS — E119 Type 2 diabetes mellitus without complications: Secondary | ICD-10-CM | POA: Insufficient documentation

## 2012-03-14 ENCOUNTER — Ambulatory Visit: Payer: Medicare Other | Admitting: Infectious Disease

## 2012-04-24 ENCOUNTER — Emergency Department (HOSPITAL_COMMUNITY): Payer: Medicare Other

## 2012-04-24 ENCOUNTER — Emergency Department (HOSPITAL_COMMUNITY)
Admission: EM | Admit: 2012-04-24 | Discharge: 2012-04-24 | Disposition: A | Discharge status: Home / Self Care | Payer: Medicare Other | Attending: Emergency Medicine | Admitting: Emergency Medicine

## 2012-04-24 DIAGNOSIS — B20 Human immunodeficiency virus [HIV] disease: Secondary | ICD-10-CM | POA: Insufficient documentation

## 2012-04-24 DIAGNOSIS — G8220 Paraplegia, unspecified: Secondary | ICD-10-CM | POA: Insufficient documentation

## 2012-04-24 DIAGNOSIS — E119 Type 2 diabetes mellitus without complications: Secondary | ICD-10-CM | POA: Insufficient documentation

## 2012-04-24 DIAGNOSIS — M542 Cervicalgia: Secondary | ICD-10-CM | POA: Insufficient documentation

## 2012-04-24 DIAGNOSIS — I10 Essential (primary) hypertension: Secondary | ICD-10-CM | POA: Insufficient documentation

## 2012-04-24 DIAGNOSIS — Z794 Long term (current) use of insulin: Secondary | ICD-10-CM | POA: Insufficient documentation

## 2012-04-24 DIAGNOSIS — Z79899 Other long term (current) drug therapy: Secondary | ICD-10-CM | POA: Insufficient documentation

## 2012-04-24 MED ORDER — OXYCODONE-ACETAMINOPHEN 5-325 MG PO TABS: 1.0000 | ORAL_TABLET | Freq: Four times a day (QID) | ORAL | Status: AC | PRN

## 2012-04-24 MED ORDER — OXYCODONE-ACETAMINOPHEN 5-325 MG PO TABS
2.0000 | ORAL_TABLET | Freq: Once | ORAL | Status: AC
  Administered 2012-04-24: 2 via ORAL
  Filled 2012-04-24: qty 2

## 2012-04-24 MED ORDER — KETOROLAC TROMETHAMINE 60 MG/2ML IM SOLN
60.0000 mg | Freq: Once | INTRAMUSCULAR | Status: AC
  Administered 2012-04-24: 60 mg via INTRAMUSCULAR
  Filled 2012-04-24: qty 2

## 2012-04-24 NOTE — ED Notes (Addendum)
Per EMS:  Pt comes from home and  is a paraplegic - pt reports neck pain when attempting to raise her head.  Pt has been persistent for the past week.  No pain when head is straight.
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
## 2012-04-29 ENCOUNTER — Other Ambulatory Visit: Payer: Self-pay | Admitting: Infectious Disease

## 2012-05-11 ENCOUNTER — Other Ambulatory Visit: Payer: Self-pay | Admitting: Infectious Disease

## 2012-05-11 DIAGNOSIS — Z113 Encounter for screening for infections with a predominantly sexual mode of transmission: Secondary | ICD-10-CM

## 2012-05-11 DIAGNOSIS — B20 Human immunodeficiency virus [HIV] disease: Secondary | ICD-10-CM

## 2012-05-11 DIAGNOSIS — Z79899 Other long term (current) drug therapy: Secondary | ICD-10-CM

## 2012-05-16 ENCOUNTER — Encounter: Payer: Self-pay | Admitting: Internal Medicine

## 2012-05-16 ENCOUNTER — Other Ambulatory Visit (INDEPENDENT_AMBULATORY_CARE_PROVIDER_SITE_OTHER): Payer: Medicare Other

## 2012-05-16 ENCOUNTER — Ambulatory Visit (INDEPENDENT_AMBULATORY_CARE_PROVIDER_SITE_OTHER): Payer: Medicare Other | Admitting: Internal Medicine

## 2012-05-16 VITALS — BP 119/85 | HR 98 | Temp 97.6°F

## 2012-05-16 DIAGNOSIS — T148XXA Other injury of unspecified body region, initial encounter: Secondary | ICD-10-CM | POA: Insufficient documentation

## 2012-05-16 DIAGNOSIS — IMO0002 Reserved for concepts with insufficient information to code with codable children: Secondary | ICD-10-CM

## 2012-05-16 DIAGNOSIS — B20 Human immunodeficiency virus [HIV] disease: Secondary | ICD-10-CM

## 2012-05-16 DIAGNOSIS — Z113 Encounter for screening for infections with a predominantly sexual mode of transmission: Secondary | ICD-10-CM

## 2012-05-16 DIAGNOSIS — Z79899 Other long term (current) drug therapy: Secondary | ICD-10-CM

## 2012-05-16 LAB — COMPREHENSIVE METABOLIC PANEL
ALT: 12 U/L (ref 0–35)
CO2: 18 mEq/L — ABNORMAL LOW (ref 19–32)
Calcium: 9 mg/dL (ref 8.4–10.5)
Chloride: 110 mEq/L (ref 96–112)
Creat: 0.67 mg/dL (ref 0.50–1.10)
Glucose, Bld: 149 mg/dL — ABNORMAL HIGH (ref 70–99)
Total Bilirubin: 0.2 mg/dL — ABNORMAL LOW (ref 0.3–1.2)

## 2012-05-16 LAB — CBC WITH DIFFERENTIAL/PLATELET
Eosinophils Absolute: 0.1 10*3/uL (ref 0.0–0.7)
Eosinophils Relative: 1 % (ref 0–5)
HCT: 36 % (ref 36.0–46.0)
Hemoglobin: 12.3 g/dL (ref 12.0–15.0)
Lymphocytes Relative: 23 % (ref 12–46)
Lymphs Abs: 1.6 10*3/uL (ref 0.7–4.0)
MCH: 26.3 pg (ref 26.0–34.0)
MCV: 77.1 fL — ABNORMAL LOW (ref 78.0–100.0)
Monocytes Absolute: 0.4 10*3/uL (ref 0.1–1.0)
Monocytes Relative: 6 % (ref 3–12)
RBC: 4.67 MIL/uL (ref 3.87–5.11)
WBC: 7.1 10*3/uL (ref 4.0–10.5)

## 2012-05-16 LAB — LIPID PANEL
Cholesterol: 186 mg/dL (ref 0–200)
LDL Cholesterol: 125 mg/dL — ABNORMAL HIGH (ref 0–99)
Triglycerides: 152 mg/dL — ABNORMAL HIGH (ref <150)
VLDL: 30 mg/dL (ref 0–40)

## 2012-05-16 NOTE — Progress Notes (Signed)
  Subjective:    Patient ID: Ellen Lane, female    DOB: 01-25-1966, 46 y.o.   MRN: [id number]  HPI She comes in for evaluation of her right great toe. She developed an abrasion after scratching it. She did develop a scab though this has since fallen off and she was concerned that it was becoming necrotic. She is diabetic and knows to be concerned of complications of infections. No fever or chills. She is paraplegic and does not feel pain in her lower extremities.   Review of Systems  Constitutional: Negative for fever and chills.  Musculoskeletal: Negative for myalgias, joint swelling and arthralgias.       Objective:   Physical Exam  Skin:       Right big toe with an abrasion and scab. No necrosis or other significant findings. No surrounding erythema. No pus or discharge. No fluctuance          Assessment & Plan:

## 2012-05-16 NOTE — Assessment & Plan Note (Signed)
Abrasion on toe, no significant findings or concerns. Topical therapy advised.

## 2012-05-17 LAB — T-HELPER CELL (CD4) - (RCID CLINIC ONLY): CD4 % Helper T Cell: 31 % — ABNORMAL LOW (ref 33–55)

## 2012-05-18 LAB — HIV-1 RNA QUANT-NO REFLEX-BLD
HIV 1 RNA Quant: 28 copies/mL — ABNORMAL HIGH (ref <20)
HIV-1 RNA Quant, Log: 1.45 {Log} — ABNORMAL HIGH (ref <1.30)

## 2012-05-31 ENCOUNTER — Ambulatory Visit: Payer: Medicare Other | Admitting: Infectious Disease

## 2012-06-13 DIAGNOSIS — M47812 Spondylosis without myelopathy or radiculopathy, cervical region: Secondary | ICD-10-CM | POA: Insufficient documentation

## 2012-06-13 DIAGNOSIS — Z5181 Encounter for therapeutic drug level monitoring: Secondary | ICD-10-CM | POA: Insufficient documentation

## 2012-06-13 DIAGNOSIS — G894 Chronic pain syndrome: Secondary | ICD-10-CM | POA: Insufficient documentation

## 2012-07-03 ENCOUNTER — Ambulatory Visit (INDEPENDENT_AMBULATORY_CARE_PROVIDER_SITE_OTHER): Payer: Medicare Other | Admitting: Infectious Disease

## 2012-07-03 ENCOUNTER — Other Ambulatory Visit (HOSPITAL_COMMUNITY)
Admission: RE | Admit: 2012-07-03 | Discharge: 2012-07-03 | Disposition: A | Discharge status: Home / Self Care | Payer: Medicare Other | Source: Ambulatory Visit | Attending: Infectious Disease | Admitting: Infectious Disease

## 2012-07-03 ENCOUNTER — Encounter: Payer: Self-pay | Admitting: Infectious Disease

## 2012-07-03 VITALS — BP 133/89 | HR 111 | Temp 98.0°F

## 2012-07-03 DIAGNOSIS — Z23 Encounter for immunization: Secondary | ICD-10-CM

## 2012-07-03 DIAGNOSIS — Z113 Encounter for screening for infections with a predominantly sexual mode of transmission: Secondary | ICD-10-CM

## 2012-07-03 DIAGNOSIS — B20 Human immunodeficiency virus [HIV] disease: Secondary | ICD-10-CM

## 2012-07-03 DIAGNOSIS — M542 Cervicalgia: Secondary | ICD-10-CM

## 2012-07-03 DIAGNOSIS — Z79899 Other long term (current) drug therapy: Secondary | ICD-10-CM

## 2012-07-03 DIAGNOSIS — E109 Type 1 diabetes mellitus without complications: Secondary | ICD-10-CM

## 2012-07-03 NOTE — Assessment & Plan Note (Signed)
Follow up with Dr. Lin
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
## 2012-07-03 NOTE — Assessment & Plan Note (Signed)
Continue Atripla 

## 2012-07-03 NOTE — Assessment & Plan Note (Signed)
Followup with physician from pain clinic in [place]

## 2012-07-06 ENCOUNTER — Telehealth: Payer: Self-pay

## 2012-07-06 NOTE — Telephone Encounter (Signed)
Yes Atripla shows a false positive marijuana test. I did not purposely teset urine drugs > I may have ordered gc and chlamydia and microablumin creatinine ratio

## 2012-07-06 NOTE — Telephone Encounter (Signed)
Pt states she has tried to get urine results form My Chart and they were not there.  She is requesting results and wants to know if we did a urine drug screen.  She went to pain center and physician stated her urine showed drugs in her system. She knows she does not do drugs and wondered if we could add test.  Would the Atripla show up as marijuana?  Please advise.

## 2012-07-07 DIAGNOSIS — M729 Fibroblastic disorder, unspecified: Secondary | ICD-10-CM | POA: Insufficient documentation

## 2012-07-07 DIAGNOSIS — G8921 Chronic pain due to trauma: Secondary | ICD-10-CM | POA: Insufficient documentation

## 2012-07-07 DIAGNOSIS — IMO0002 Reserved for concepts with insufficient information to code with codable children: Secondary | ICD-10-CM | POA: Insufficient documentation

## 2012-08-22 ENCOUNTER — Other Ambulatory Visit: Payer: Self-pay | Admitting: Infectious Disease

## 2012-08-22 DIAGNOSIS — B20 Human immunodeficiency virus [HIV] disease: Secondary | ICD-10-CM

## 2012-09-04 ENCOUNTER — Telehealth: Payer: Self-pay | Admitting: Licensed Clinical Social Worker

## 2012-09-04 DIAGNOSIS — M62838 Other muscle spasm: Secondary | ICD-10-CM

## 2012-09-04 MED ORDER — DIAZEPAM 5 MG PO TABS: 5.0000 mg | ORAL_TABLET | Freq: Three times a day (TID) | ORAL | Status: DC | PRN

## 2012-09-04 NOTE — Telephone Encounter (Signed)
Ok to refill, thanks

## 2012-09-04 NOTE — Telephone Encounter (Signed)
Patient is a paraplegic and currently having muscle spasms, Dr. Philipp Deputy used to prescribe valium at [facility], but she has not received  a refill since she started seeing Dr. Daiva Eves. Is this ok to refill?

## 2012-09-13 ENCOUNTER — Other Ambulatory Visit (HOSPITAL_COMMUNITY): Payer: Self-pay | Admitting: Student

## 2012-09-13 DIAGNOSIS — Z1231 Encounter for screening mammogram for malignant neoplasm of breast: Secondary | ICD-10-CM

## 2012-10-05 ENCOUNTER — Ambulatory Visit (HOSPITAL_COMMUNITY): Payer: Medicare Other

## 2012-10-12 ENCOUNTER — Other Ambulatory Visit: Payer: Self-pay | Admitting: Infectious Disease

## 2012-10-23 ENCOUNTER — Other Ambulatory Visit: Payer: Self-pay | Admitting: *Deleted

## 2012-10-23 DIAGNOSIS — M62838 Other muscle spasm: Secondary | ICD-10-CM

## 2012-10-23 MED ORDER — DIAZEPAM 5 MG PO TABS: 5.0000 mg | ORAL_TABLET | Freq: Three times a day (TID) | ORAL | Status: DC | PRN

## 2012-10-24 ENCOUNTER — Ambulatory Visit (HOSPITAL_COMMUNITY): Payer: Medicare Other

## 2012-10-25 ENCOUNTER — Other Ambulatory Visit: Payer: Medicare Other

## 2012-10-30 ENCOUNTER — Ambulatory Visit: Payer: Medicare Other | Admitting: Obstetrics and Gynecology

## 2012-11-01 ENCOUNTER — Other Ambulatory Visit: Payer: Medicare Other

## 2012-11-08 ENCOUNTER — Ambulatory Visit: Payer: Medicare Other | Admitting: Infectious Disease

## 2012-11-15 ENCOUNTER — Ambulatory Visit: Payer: Medicare Other | Admitting: Infectious Disease

## 2012-11-15 ENCOUNTER — Ambulatory Visit: Payer: Medicare Other | Admitting: Obstetrics & Gynecology

## 2012-11-15 ENCOUNTER — Other Ambulatory Visit: Payer: Medicare Other

## 2012-11-23 ENCOUNTER — Other Ambulatory Visit: Payer: Medicare Other

## 2012-11-23 DIAGNOSIS — Z79899 Other long term (current) drug therapy: Secondary | ICD-10-CM

## 2012-11-23 DIAGNOSIS — B20 Human immunodeficiency virus [HIV] disease: Secondary | ICD-10-CM

## 2012-11-23 DIAGNOSIS — Z113 Encounter for screening for infections with a predominantly sexual mode of transmission: Secondary | ICD-10-CM

## 2012-11-23 LAB — LIPID PANEL
HDL: 33 mg/dL — ABNORMAL LOW (ref >39)
LDL Cholesterol: 93 mg/dL (ref 0–99)
Total CHOL/HDL Ratio: 4.9 Ratio
Triglycerides: 177 mg/dL — ABNORMAL HIGH (ref <150)
VLDL: 35 mg/dL (ref 0–40)

## 2012-11-23 LAB — COMPLETE METABOLIC PANEL WITH GFR
ALT: 11 U/L (ref 0–35)
AST: 8 U/L (ref 0–37)
Albumin: 3.8 g/dL (ref 3.5–5.2)
Alkaline Phosphatase: 129 U/L — ABNORMAL HIGH (ref 39–117)
GFR, Est Non African American: >89 mL/min
Potassium: 4.2 mEq/L (ref 3.5–5.3)
Sodium: 136 mEq/L (ref 135–145)
Total Bilirubin: 0.2 mg/dL — ABNORMAL LOW (ref 0.3–1.2)
Total Protein: 6.7 g/dL (ref 6.0–8.3)

## 2012-11-23 LAB — CBC WITH DIFFERENTIAL/PLATELET
Basophils Absolute: 0 10*3/uL (ref 0.0–0.1)
Lymphocytes Relative: 25 % (ref 12–46)
Lymphs Abs: 1.8 10*3/uL (ref 0.7–4.0)
Neutro Abs: 4.8 10*3/uL (ref 1.7–7.7)
Platelets: 202 10*3/uL (ref 150–400)
RBC: 4.58 MIL/uL (ref 3.87–5.11)
RDW: 14.8 % (ref 11.5–15.5)
WBC: 7.1 10*3/uL (ref 4.0–10.5)

## 2012-11-24 ENCOUNTER — Ambulatory Visit (HOSPITAL_COMMUNITY)
Admission: RE | Admit: 2012-11-24 | Discharge: 2012-11-24 | Disposition: A | Discharge status: Home / Self Care | Payer: Medicare Other | Source: Ambulatory Visit | Attending: Student | Admitting: Student

## 2012-11-24 DIAGNOSIS — Z1231 Encounter for screening mammogram for malignant neoplasm of breast: Secondary | ICD-10-CM | POA: Insufficient documentation

## 2012-11-24 LAB — HIV-1 RNA QUANT-NO REFLEX-BLD
HIV 1 RNA Quant: <20 copies/mL (ref <20)
HIV-1 RNA Quant, Log: <1.3 {Log} (ref <1.30)

## 2012-12-04 ENCOUNTER — Other Ambulatory Visit: Payer: Medicare Other

## 2012-12-04 ENCOUNTER — Ambulatory Visit: Payer: Medicare Other | Admitting: Infectious Disease

## 2012-12-07 ENCOUNTER — Ambulatory Visit (INDEPENDENT_AMBULATORY_CARE_PROVIDER_SITE_OTHER): Payer: Medicare Other | Admitting: Infectious Disease

## 2012-12-07 ENCOUNTER — Encounter: Payer: Self-pay | Admitting: Infectious Disease

## 2012-12-07 VITALS — BP 141/102 | HR 101 | Temp 98.2°F

## 2012-12-07 DIAGNOSIS — E119 Type 2 diabetes mellitus without complications: Secondary | ICD-10-CM

## 2012-12-07 DIAGNOSIS — Z23 Encounter for immunization: Secondary | ICD-10-CM

## 2012-12-07 DIAGNOSIS — G8220 Paraplegia, unspecified: Secondary | ICD-10-CM

## 2012-12-07 DIAGNOSIS — G894 Chronic pain syndrome: Secondary | ICD-10-CM

## 2012-12-07 DIAGNOSIS — B20 Human immunodeficiency virus [HIV] disease: Secondary | ICD-10-CM

## 2012-12-07 DIAGNOSIS — E785 Hyperlipidemia, unspecified: Secondary | ICD-10-CM

## 2012-12-07 DIAGNOSIS — I10 Essential (primary) hypertension: Secondary | ICD-10-CM
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
## 2012-12-12 ENCOUNTER — Telehealth: Payer: Self-pay | Admitting: Licensed Clinical Social Worker

## 2012-12-12 NOTE — Telephone Encounter (Signed)
I called and left him your cell phone number

## 2012-12-12 NOTE — Telephone Encounter (Signed)
Patient states that she needs to take this medication every 8 hours and 30 will only last 5 days. I explained that this is a prn drug and is not supposed to be taken like that. She explains that she is in constant pain and have a spinal cord injury. She wants Dr. Daiva Eves to give her 90 pills.

## 2012-12-12 NOTE — Telephone Encounter (Signed)
I am ok giving her 10/325mg  percocet #30 monthly since this is the amount that her PCP at [facility] had been rx. I had discussion with him and feel comfortable rx pain meds for her with contract

## 2012-12-12 NOTE — Telephone Encounter (Signed)
I have not spoken to Dr. Haig Prophet yet. Can we call Dr Haig Prophet and get him to call me back on my cell phone number?

## 2012-12-12 NOTE — Telephone Encounter (Signed)
Patient called wanting to know if Dr. Daiva Eves talked with Dr. Haig Prophet and he decided to start the pain contract so she won't have to travel to [facility] every month.

## 2012-12-12 NOTE — Telephone Encounter (Signed)
I will give her #30 and then we can reassess. If she truly needs it tid she will indeed run out in 10 days. We need to run the database on her

## 2012-12-13 ENCOUNTER — Ambulatory Visit: Payer: Medicare Other | Admitting: Obstetrics and Gynecology

## 2012-12-14 ENCOUNTER — Other Ambulatory Visit: Payer: Self-pay | Admitting: Licensed Clinical Social Worker

## 2012-12-14 ENCOUNTER — Telehealth: Payer: Self-pay | Admitting: *Deleted

## 2012-12-14 DIAGNOSIS — M542 Cervicalgia: Secondary | ICD-10-CM

## 2012-12-14 MED ORDER — OXYCODONE-ACETAMINOPHEN 10-325 MG PO TABS: 1.0000 | ORAL_TABLET | Freq: Three times a day (TID) | ORAL | Status: DC | PRN

## 2012-12-14 NOTE — Telephone Encounter (Signed)
The rx was printed today for your signature.  The pt does not have a Narcotic Pain Contract.  She will need to sign one when she picks up the rx for #30 tablets.

## 2012-12-14 NOTE — Telephone Encounter (Signed)
Pt requesting pain rx.  RN reviewed pt record and noted Dr. Zenaida Niece Dam's last comment.  RN advised pt that Dr. Daiva Eves would have to be consulted about her request and he would not be in the office until this afternoon.  Obtained call back number for the pt.  [phone].  MD please advise.

## 2012-12-14 NOTE — Telephone Encounter (Signed)
I agreed to give her percocet #30, did she not pick this up yet?

## 2012-12-16 ENCOUNTER — Other Ambulatory Visit: Payer: Self-pay | Admitting: Infectious Disease

## 2012-12-18 ENCOUNTER — Other Ambulatory Visit: Payer: Self-pay | Admitting: *Deleted

## 2012-12-18 DIAGNOSIS — M62838 Other muscle spasm: Secondary | ICD-10-CM

## 2012-12-18 MED ORDER — DIAZEPAM 5 MG PO TABS: 5.0000 mg | ORAL_TABLET | Freq: Three times a day (TID) | ORAL | Status: DC | PRN

## 2012-12-25 ENCOUNTER — Telehealth: Payer: Self-pay | Admitting: *Deleted

## 2012-12-25 NOTE — Telephone Encounter (Signed)
Patient called and Ellen Lane she needs a refill on her Percocet. Advised the patient she picked up 30 tablets on 12/14/12 and I will have to ask the doctor before she can get another Rx. Patient was not happy she would have to wait. She advised that is only a 30 day supply and her docotor should understand she takes them every 8 hours for her pain. Advised her understand but will have to ask before filling the Rx and call her back.

## 2012-12-25 NOTE — Telephone Encounter (Signed)
Please see my notes re this. Per Dr Haig Prophet the pt ONLY would fill #30 every month if that in the past. She was asking for #90 from him so that she could have 3 month supply.  He did NOT suspect her of diversion but I am unsure here and bothered by her badgering [place] so quickly about these meds when I have NOT been rx pain meds for her UNTIL last month  I am willing to go to #60 for now IF she has urine or blood test with confirmatory oxycodone assay in it when she comes to pickup her meds

## 2012-12-25 NOTE — Telephone Encounter (Signed)
I did read your note but the patient was insistent that she get another Rx and was under the impression that all she had to do is call when she ran out. I advised her to find a PCP here in [place], as we are treating her 042, I even offered to help her but she again insisted that you were going to handle her medication as she has a PCP in the [place] area. Just to clarify you want her to have a urine or blood screen prior to getting her Rx?

## 2012-12-26 ENCOUNTER — Other Ambulatory Visit: Payer: Self-pay | Admitting: Licensed Clinical Social Worker

## 2012-12-26 DIAGNOSIS — M542 Cervicalgia: Secondary | ICD-10-CM

## 2012-12-26 MED ORDER — OXYCODONE-ACETAMINOPHEN 10-325 MG PO TABS: 1.0000 | ORAL_TABLET | Freq: Three times a day (TID) | ORAL | Status: DC | PRN

## 2012-12-28 ENCOUNTER — Telehealth: Payer: Self-pay | Admitting: *Deleted

## 2012-12-28 DIAGNOSIS — M542 Cervicalgia: Secondary | ICD-10-CM

## 2012-12-28 NOTE — Telephone Encounter (Signed)
Per Dr Clinton Gallant note pt needs to sign Narcotic/Medication contract, have lab work completed prior to receiving prescription for Percocet.  Lab work orders entered.  RN contacted pt.  Pt needs to arrange transportation to come to [facility].  Scheduled lab work for [employer] March 19th.  Rescheduled Hep B #2 for Wednesday, March 19th too.  Pt advised that she would be signing Pain/Medication Contract and having lab work that morning to receive her Percocet rx.  Pt given choice of either urine or blood collection.

## 2013-01-01 ENCOUNTER — Other Ambulatory Visit: Payer: Self-pay | Admitting: Infectious Disease

## 2013-01-02 ENCOUNTER — Ambulatory Visit: Payer: Medicare Other

## 2013-01-03 ENCOUNTER — Ambulatory Visit: Payer: Medicare Other

## 2013-01-03 ENCOUNTER — Other Ambulatory Visit: Payer: Medicare Other

## 2013-01-04 ENCOUNTER — Ambulatory Visit: Payer: Medicare Other

## 2013-01-04 ENCOUNTER — Encounter: Payer: Self-pay | Admitting: Infectious Disease

## 2013-01-04 ENCOUNTER — Ambulatory Visit (INDEPENDENT_AMBULATORY_CARE_PROVIDER_SITE_OTHER): Payer: Medicare Other | Admitting: Infectious Disease

## 2013-01-04 ENCOUNTER — Ambulatory Visit (INDEPENDENT_AMBULATORY_CARE_PROVIDER_SITE_OTHER): Payer: Medicare Other | Admitting: *Deleted

## 2013-01-04 ENCOUNTER — Other Ambulatory Visit: Payer: Medicare Other

## 2013-01-04 VITALS — BP 88/61 | HR 80 | Temp 98.7°F

## 2013-01-04 DIAGNOSIS — R300 Dysuria: Secondary | ICD-10-CM

## 2013-01-04 DIAGNOSIS — G894 Chronic pain syndrome: Secondary | ICD-10-CM

## 2013-01-04 DIAGNOSIS — G8220 Paraplegia, unspecified: Secondary | ICD-10-CM

## 2013-01-04 DIAGNOSIS — Z23 Encounter for immunization: Secondary | ICD-10-CM

## 2013-01-04 DIAGNOSIS — Z113 Encounter for screening for infections with a predominantly sexual mode of transmission: Secondary | ICD-10-CM

## 2013-01-04 DIAGNOSIS — M542 Cervicalgia: Secondary | ICD-10-CM

## 2013-01-04 DIAGNOSIS — B20 Human immunodeficiency virus [HIV] disease: Secondary | ICD-10-CM

## 2013-01-04 MED ORDER — CIPROFLOXACIN HCL 500 MG PO TABS: 500.0000 mg | ORAL_TABLET | Freq: Two times a day (BID) | ORAL | Status: DC
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
## 2013-01-04 NOTE — Progress Notes (Signed)
#  3 Hep B not due for 4-6 months.  Pt needing blood specimen drawn to obtain pain medication per Dr. Daiva Eves.  Pt agreeable to blood draw.  Pt also complaining on urinary tract infection symptoms.  Brought in urine specimen.  RN place pt on Dr. Zenaida Niece Dam's schedule for this afternoon to evaluate UTI symptoms.  Pt to obtain pain contract for signature and obtain pain rx script previously signed by Dr. Daiva Eves.  Pt is looking for a PCP who accepts [facility] and Medicare.  Pt given information about Dr. Onalee Hua Talbot's new office in [place], [place].  Pt will check with Dr. Benjaman Lobe office about [facility].  If Dr. Reche Dixon does not accept [facility] will call Dr Julio Sicks.

## 2013-01-05 LAB — URINALYSIS, ROUTINE W REFLEX MICROSCOPIC
Bilirubin Urine: NEGATIVE
Ketones, ur: NEGATIVE mg/dL
Nitrite: POSITIVE — AB
Specific Gravity, Urine: 1.024 (ref 1.005–1.030)
Urobilinogen, UA: 0.2 mg/dL (ref 0.0–1.0)
pH: 6 (ref 5.0–8.0)

## 2013-01-05 LAB — BASIC METABOLIC PANEL WITH GFR
GFR, Est African American: >89 mL/min
GFR, Est Non African American: >89 mL/min
Potassium: 4.1 mEq/L (ref 3.5–5.3)
Sodium: 139 mEq/L (ref 135–145)

## 2013-01-05 LAB — URINALYSIS, MICROSCOPIC ONLY

## 2013-01-07 LAB — URINE CULTURE: Colony Count: >=100000

## 2013-01-08 ENCOUNTER — Telehealth: Payer: Self-pay | Admitting: *Deleted

## 2013-01-08 ENCOUNTER — Other Ambulatory Visit: Payer: Self-pay | Admitting: *Deleted

## 2013-01-08 ENCOUNTER — Ambulatory Visit: Payer: Medicare Other | Admitting: Obstetrics and Gynecology

## 2013-01-08 NOTE — Telephone Encounter (Signed)
Her bacteria WERE sensitive to Ciprofloxacin.   She can have zofran and/or phenergan to rx the nausea and vomiting and if needed we can bring her back for another visit with [place]

## 2013-01-08 NOTE — Telephone Encounter (Signed)
I'll make her an appointment with you this week.

## 2013-01-08 NOTE — Telephone Encounter (Signed)
Patient called reporting no relief of UTI symptoms since starting Cipro on Friday.  In addition to her dysuria and "kidney pain," she is now reporting N/V with brown emesis x 3 days.  She states she has had difficulty keeping her medications down, wonders if she needs something with an enteric coating. Patient has not taken her temperature since Friday, but does endorse fever-like symptoms over the weekend.  Patient did not note any change in her bowel patterns.   Please advise if she needs a different antibiotic based on the lab results and also if she should be seen d/t her brown vomit. Andree Coss, RN

## 2013-01-08 NOTE — Telephone Encounter (Signed)
Will call those in.  Do you want to extend the antibiotic therapy?  She states she was only given 5 days of medication on Friday and she is still symptomatic.

## 2013-01-08 NOTE — Telephone Encounter (Signed)
I think I put a refill on it. That is fine if she wants to go to 10 days. DOesnt make sense her getting worse if this is truly due to bladder infection given org was S to it

## 2013-01-10 ENCOUNTER — Other Ambulatory Visit: Payer: Self-pay | Admitting: Infectious Disease

## 2013-01-10 ENCOUNTER — Ambulatory Visit: Payer: Medicare Other | Admitting: Infectious Disease

## 2013-01-11 ENCOUNTER — Ambulatory Visit: Payer: Medicare Other | Admitting: Obstetrics & Gynecology

## 2013-01-11 ENCOUNTER — Encounter: Payer: Self-pay | Admitting: *Deleted

## 2013-01-11 NOTE — Progress Notes (Signed)
This encounter was created in error - please disregard.

## 2013-01-15 ENCOUNTER — Ambulatory Visit: Payer: Medicare Other | Admitting: Infectious Disease

## 2013-01-15 ENCOUNTER — Other Ambulatory Visit: Payer: Self-pay | Admitting: Infectious Disease

## 2013-01-15 LAB — OTHER SOLSTAS TEST

## 2013-01-22 ENCOUNTER — Other Ambulatory Visit: Payer: Self-pay | Admitting: Infectious Disease

## 2013-01-22 NOTE — Telephone Encounter (Signed)
Patient called and advised she needs her diazapam refilled. She also wanted to know the status of her referral to [facility]. Advised her I will take care of the refill but that I would have to ask her doctor nurse about the referral as there is nothing in the system and someone will give her a call.

## 2013-01-22 NOTE — Telephone Encounter (Signed)
Spoke with patient to let her know that I scheduled her to see Dr. Valentina Lucks with [facility] at [facility] on 03/06/2013 at 1:30pm. Informed patient to take both forms of insurance with her to her appointment.

## 2013-01-23 ENCOUNTER — Other Ambulatory Visit: Payer: Self-pay | Admitting: Licensed Clinical Social Worker

## 2013-01-23 DIAGNOSIS — M62838 Other muscle spasm: Secondary | ICD-10-CM

## 2013-01-23 MED ORDER — DIAZEPAM 5 MG PO TABS: 5.0000 mg | ORAL_TABLET | Freq: Three times a day (TID) | ORAL | Status: DC | PRN

## 2013-02-05 ENCOUNTER — Telehealth: Payer: Self-pay | Admitting: *Deleted

## 2013-02-05 ENCOUNTER — Encounter: Payer: Self-pay | Admitting: *Deleted

## 2013-02-05 NOTE — Telephone Encounter (Signed)
Patient called for her percocet refill. Patient notified that Dr. Daiva Eves will no longer be prescribing this medication for her as her last random drug screen was negative for oxycodone.  Patient notified that she will be able to discuss this with Dr. Daiva Eves at her next appointment.  Patient called back, requesting to know what the "next step" would be and when she can get that referral so that she might get her medications.  Patient asked how her blood test could be negative when she states that she "always has something in her" whether it's because she took too many percocets or because she smokes marijuana.  Patient states she is out of pain medicine and now is only taking tylenol that doesn't help at all.  I advised her that I would ask Dr. Daiva Eves what the next step would be, but that in the mean time, if her pain was out of control to go to the ED. Patient verbalized understanding. Andree Coss, RN

## 2013-02-05 NOTE — Telephone Encounter (Signed)
We will simply NOT be prescribing controlled substances. She is certainly capable of going back to her PCP at [facility] to raise the issue with him. He had rx this medicine for her chronically and I had only done so recently at her suggestion. I cannot guarantee that he will resume rx the medicine for her under the current circumstances

## 2013-02-05 NOTE — Telephone Encounter (Signed)
Patient notified. She will contact Dr. Haig Prophet.

## 2013-02-06 NOTE — Telephone Encounter (Signed)
thx

## 2013-02-15 ENCOUNTER — Ambulatory Visit: Payer: Medicare Other | Admitting: Obstetrics & Gynecology

## 2013-03-15 ENCOUNTER — Ambulatory Visit: Payer: Medicare Other | Admitting: Obstetrics & Gynecology

## 2013-03-15 ENCOUNTER — Other Ambulatory Visit: Payer: Self-pay | Admitting: Infectious Disease

## 2013-03-15 NOTE — Telephone Encounter (Signed)
Request from pharmacy for Valium refill, please advise Ellen Lane

## 2013-03-16 ENCOUNTER — Other Ambulatory Visit: Payer: Self-pay | Admitting: *Deleted

## 2013-03-16 ENCOUNTER — Telehealth: Payer: Self-pay | Admitting: *Deleted

## 2013-03-16 DIAGNOSIS — I10 Essential (primary) hypertension: Secondary | ICD-10-CM

## 2013-03-16 MED ORDER — LISINOPRIL 5 MG PO TABS: 5.0000 mg | ORAL_TABLET | Freq: Every day | ORAL | Status: DC

## 2013-03-16 NOTE — Telephone Encounter (Signed)
Refill request from pharmacy for Valium, are you prescribing this, as there is a note for no more controlled substances. Wendall Mola CMA

## 2013-03-19 ENCOUNTER — Other Ambulatory Visit: Payer: Self-pay | Admitting: Infectious Disease

## 2013-03-19 NOTE — Telephone Encounter (Signed)
While we did not do a screen to see if she is taking this benzodiazepene, but the negative sceen for her narcotics was concerning. Is she still seeing a primary care doc at [facility]--if so I would prefer they rx controlled substances. Both she and husbands screens for the narcotics they were supposed to be taking wer negative

## 2013-03-23 ENCOUNTER — Other Ambulatory Visit: Payer: Self-pay | Admitting: Licensed Clinical Social Worker

## 2013-03-23 DIAGNOSIS — M62838 Other muscle spasm: Secondary | ICD-10-CM

## 2013-03-23 MED ORDER — DIAZEPAM 5 MG PO TABS: 5.0000 mg | ORAL_TABLET | Freq: Three times a day (TID) | ORAL | Status: DC | PRN

## 2013-04-19 ENCOUNTER — Ambulatory Visit: Payer: Medicare Other | Admitting: Family Medicine

## 2013-05-05 DIAGNOSIS — Z87898 Personal history of other specified conditions: Secondary | ICD-10-CM | POA: Insufficient documentation

## 2013-05-05 DIAGNOSIS — M62838 Other muscle spasm: Secondary | ICD-10-CM | POA: Insufficient documentation

## 2013-05-05 DIAGNOSIS — N319 Neuromuscular dysfunction of bladder, unspecified: Secondary | ICD-10-CM | POA: Insufficient documentation

## 2013-05-09 ENCOUNTER — Ambulatory Visit (INDEPENDENT_AMBULATORY_CARE_PROVIDER_SITE_OTHER): Payer: Medicare Other | Admitting: Obstetrics & Gynecology

## 2013-05-09 ENCOUNTER — Other Ambulatory Visit (HOSPITAL_COMMUNITY)
Admission: RE | Admit: 2013-05-09 | Discharge: 2013-05-09 | Disposition: A | Discharge status: Home / Self Care | Payer: Medicare Other | Source: Ambulatory Visit | Attending: Obstetrics & Gynecology | Admitting: Obstetrics & Gynecology

## 2013-05-09 ENCOUNTER — Encounter: Payer: Self-pay | Admitting: Obstetrics & Gynecology

## 2013-05-09 ENCOUNTER — Ambulatory Visit: Payer: Medicare Other | Admitting: Obstetrics & Gynecology

## 2013-05-09 DIAGNOSIS — Z1151 Encounter for screening for human papillomavirus (HPV): Secondary | ICD-10-CM | POA: Insufficient documentation

## 2013-05-09 DIAGNOSIS — N939 Abnormal uterine and vaginal bleeding, unspecified: Secondary | ICD-10-CM

## 2013-05-09 DIAGNOSIS — Z113 Encounter for screening for infections with a predominantly sexual mode of transmission: Secondary | ICD-10-CM | POA: Insufficient documentation

## 2013-05-09 DIAGNOSIS — N926 Irregular menstruation, unspecified: Secondary | ICD-10-CM

## 2013-05-09 DIAGNOSIS — Z124 Encounter for screening for malignant neoplasm of cervix: Secondary | ICD-10-CM | POA: Insufficient documentation

## 2013-05-09 DIAGNOSIS — Z01419 Encounter for gynecological examination (general) (routine) without abnormal findings: Secondary | ICD-10-CM

## 2013-05-09 MED ORDER — MEGESTROL ACETATE 40 MG PO TABS: 40.0000 mg | ORAL_TABLET | Freq: Two times a day (BID) | ORAL | Status: DC

## 2013-05-09 NOTE — Progress Notes (Addendum)
  Subjective:     Ellen Lane is a 47 y.o. [id number] paraplegic female with history of HIV, Type 1 DM, HTN and associated comorbidities who is here for a comprehensive gynecologic physical exam. Patient also reports having heavy prolonged menstrual periods in the last few months.  She is sexually active, no problems with this.  No other GYN problems.  History   Social History  . Marital Status: Divorced    Spouse Name: N/A    Number of Children: N/A  . Years of Education: N/A   Occupational History  . Not on file.   Social History Main Topics  . Smoking status: Never Smoker   . Smokeless tobacco: Never Used  . Alcohol Use: No  . Drug Use: No  . Sexually Active: No   Other Topics Concern  . Not on file   Social History Narrative  . No narrative on file   Health Maintenance  Topic Date Due  . Tetanus/tdap  12/04/1986  . Influenza Vaccine  06/18/2013  . Pap Smear  03/30/2014    The following portions of the patient's history were reviewed and updated as appropriate: allergies, current medications, past family history, past medical history, past social history, past surgical history and problem list. Last mammogram on 11/27/12 was normal.  Review of Systems Pertinent items are noted in HPI.   Objective:   AFVSS GENERAL: Paraplegic female in no acute distress.  HEENT: Normocephalic, atraumatic. Sclerae anicteric.  NECK: Supple. Normal thyroid.  LUNGS: Clear to auscultation bilaterally.  HEART: Regular rate and rhythm.  BREASTS: Large, symmetric in size. No masses, skin changes, nipple drainage, or lymphadenopathy.  ABDOMEN: Soft, obese, nontender, nondistended. No organomegaly palpated. PELVIC: Normal external female genitalia. Vagina is pink and rugated. Normal discharge. Normal cervix contour. Pap smear obtained. Unable to palpate uterus or adnexa secondary to obese habitus  EXTREMITIES: No cyanosis, clubbing, or edema, 2+ distal pulses   ENDOMETRIAL BIOPSY      The indications for endometrial biopsy were reviewed.   Risks of the biopsy including cramping, bleeding, infection, uterine perforation, inadequate specimen and need for additional procedures  were discussed. The patient states she understands and agrees to undergo procedure today. Consent was signed. Time out was performed. Urine HCG was negative. After the pap smear was done during the pelvic exam,  the cervix was prepped with Betadine. A single-toothed tenaculum was placed on the anterior lip of the cervix to stabilize it. The 3 mm pipelle was introduced into the endometrial cavity without difficulty to a depth of 10 cm, and a moderate amount of tissue was obtained and sent to pathology. The instruments were removed from the patient's vagina. Minimal bleeding from the cervix was noted. The patient tolerated the procedure well. Routine post-procedure instructions were given to the patient.    Assessment:   Healthy female exam.   Abnormal uterine bleeding s/p endometrial biopsy  Plan:    Pap and endometrial biopsy done, will follow up results and manage accordingly. For her AUB, will order pelvic ultrasound.  Megace also prescribed as needed.  Bleeding precautions reviewed. Follow up with PCP and other specialists regarding other medical problems Routine preventative health maintenance measures emphasized.  Jaynie Collins, MD, FACOG Attending Obstetrician & Gynecologist Faculty Practice, [facility]

## 2013-05-09 NOTE — Patient Instructions (Signed)
Preventive Care for Adults, Female A healthy lifestyle and preventive care can promote health and wellness. Preventive health guidelines for women include the following key practices.  A routine yearly physical is a good way to check with your caregiver about your health and preventive screening. It is a chance to share any concerns and updates on your health, and to receive a thorough exam.  Visit your dentist for a routine exam and preventive care every 6 months. Brush your teeth twice a day and floss once a day. Good oral hygiene prevents tooth decay and gum disease.  The frequency of eye exams is based on your age, health, family medical history, use of contact lenses, and other factors. Follow your caregiver's recommendations for frequency of eye exams.  Eat a healthy diet. Foods like vegetables, fruits, whole grains, low-fat dairy products, and lean protein foods contain the nutrients you need without too many calories. Decrease your intake of foods high in solid fats, added sugars, and salt. Eat the right amount of calories for you.Get information about a proper diet from your caregiver, if necessary.  Regular physical exercise is one of the most important things you can do for your health. Most adults should get at least 150 minutes of moderate-intensity exercise (any activity that increases your heart rate and causes you to sweat) each week. In addition, most adults need muscle-strengthening exercises on 2 or more days a week.  Maintain a healthy weight. The body mass index (BMI) is a screening tool to identify possible weight problems. It provides an estimate of body fat based on height and weight. Your caregiver can help determine your BMI, and can help you achieve or maintain a healthy weight.For adults 20 years and older:  A BMI below 18.5 is considered underweight.  A BMI of 18.5 to 24.9 is normal.  A BMI of 25 to 29.9 is considered overweight.  A BMI of 30 and above is  considered obese.  Maintain normal blood lipids and cholesterol levels by exercising and minimizing your intake of saturated fat. Eat a balanced diet with plenty of fruit and vegetables. Blood tests for lipids and cholesterol should begin at age 20 and be repeated every 5 years. If your lipid or cholesterol levels are high, you are over 50, or you are at high risk for heart disease, you may need your cholesterol levels checked more frequently.Ongoing high lipid and cholesterol levels should be treated with medicines if diet and exercise are not effective.  If you smoke, find out from your caregiver how to quit. If you do not use tobacco, do not start.  If you are pregnant, do not drink alcohol. If you are breastfeeding, be very cautious about drinking alcohol. If you are not pregnant and choose to drink alcohol, do not exceed 1 drink per day. One drink is considered to be 12 ounces (355 mL) of beer, 5 ounces (148 mL) of wine, or 1.5 ounces (44 mL) of liquor.  Avoid use of street drugs. Do not share needles with anyone. Ask for help if you need support or instructions about stopping the use of drugs.  High blood pressure causes heart disease and increases the risk of stroke. Your blood pressure should be checked at least every 1 to 2 years. Ongoing high blood pressure should be treated with medicines if weight loss and exercise are not effective.  If you are 55 to 45 years old, ask your caregiver if you should take aspirin to prevent strokes.  Diabetes   screening involves taking a blood sample to check your fasting blood sugar level. This should be done once every 3 years, after age 45, if you are within normal weight and without risk factors for diabetes. Testing should be considered at a younger age or be carried out more frequently if you are overweight and have at least 1 risk factor for diabetes.  Breast cancer screening is essential preventive care for women. You should practice "breast  self-awareness." This means understanding the normal appearance and feel of your breasts and may include breast self-examination. Any changes detected, no matter how small, should be reported to a caregiver. Women in their 20s and 30s should have a clinical breast exam (CBE) by a caregiver as part of a regular health exam every 1 to 3 years. After age 40, women should have a CBE every year. Starting at age 40, women should consider having a mammography (breast X-ray test) every year. Women who have a family history of breast cancer should talk to their caregiver about genetic screening. Women at a high risk of breast cancer should talk to their caregivers about having magnetic resonance imaging (MRI) and a mammography every year.  The Pap test is a screening test for cervical cancer. A Pap test can show cell changes on the cervix that might become cervical cancer if left untreated. A Pap test is a procedure in which cells are obtained and examined from the lower end of the uterus (cervix).  Women should have a Pap test starting at age 21.  Between ages 21 and 29, Pap tests should be repeated every 2 years.  Beginning at age 30, you should have a Pap test every 3 years as long as the past 3 Pap tests have been normal.  Some women have medical problems that increase the chance of getting cervical cancer. Talk to your caregiver about these problems. It is especially important to talk to your caregiver if a new problem develops soon after your last Pap test. In these cases, your caregiver may recommend more frequent screening and Pap tests.  The above recommendations are the same for women who have or have not gotten the vaccine for human papillomavirus (HPV).  If you had a hysterectomy for a problem that was not cancer or a condition that could lead to cancer, then you no longer need Pap tests. Even if you no longer need a Pap test, a regular exam is a good idea to make sure no other problems are  starting.  If you are between ages 65 and 70, and you have had normal Pap tests going back 10 years, you no longer need Pap tests. Even if you no longer need a Pap test, a regular exam is a good idea to make sure no other problems are starting.  If you have had past treatment for cervical cancer or a condition that could lead to cancer, you need Pap tests and screening for cancer for at least 20 years after your treatment.  If Pap tests have been discontinued, risk factors (such as a new sexual partner) need to be reassessed to determine if screening should be resumed.  The HPV test is an additional test that may be used for cervical cancer screening. The HPV test looks for the virus that can cause the cell changes on the cervix. The cells collected during the Pap test can be tested for HPV. The HPV test could be used to screen women aged 30 years and older, and should   be used in women of any age who have unclear Pap test results. After the age of 30, women should have HPV testing at the same frequency as a Pap test.  Colorectal cancer can be detected and often prevented. Most routine colorectal cancer screening begins at the age of 50 and continues through age 75. However, your caregiver may recommend screening at an earlier age if you have risk factors for colon cancer. On a yearly basis, your caregiver may provide home test kits to check for hidden blood in the stool. Use of a small camera at the end of a tube, to directly examine the colon (sigmoidoscopy or colonoscopy), can detect the earliest forms of colorectal cancer. Talk to your caregiver about this at age 50, when routine screening begins. Direct examination of the colon should be repeated every 5 to 10 years through age 75, unless early forms of pre-cancerous polyps or small growths are found.  Hepatitis C blood testing is recommended for all people born from 1945 through 1965 and any individual with known risks for hepatitis C.  Practice  safe sex. Use condoms and avoid high-risk sexual practices to reduce the spread of sexually transmitted infections (STIs). STIs include gonorrhea, chlamydia, syphilis, trichomonas, herpes, HPV, and human immunodeficiency virus (HIV). Herpes, HIV, and HPV are viral illnesses that have no cure. They can result in disability, cancer, and death. Sexually active women aged 25 and younger should be checked for chlamydia. Older women with new or multiple partners should also be tested for chlamydia. Testing for other STIs is recommended if you are sexually active and at increased risk.  Osteoporosis is a disease in which the bones lose minerals and strength with aging. This can result in serious bone fractures. The risk of osteoporosis can be identified using a bone density scan. Women ages 65 and over and women at risk for fractures or osteoporosis should discuss screening with their caregivers. Ask your caregiver whether you should take a calcium supplement or vitamin D to reduce the rate of osteoporosis.  Menopause can be associated with physical symptoms and risks. Hormone replacement therapy is available to decrease symptoms and risks. You should talk to your caregiver about whether hormone replacement therapy is right for you.  Use sunscreen with sun protection factor (SPF) of 30 or more. Apply sunscreen liberally and repeatedly throughout the day. You should seek shade when your shadow is shorter than you. Protect yourself by wearing long sleeves, pants, a wide-brimmed hat, and sunglasses year round, whenever you are outdoors.  Once a month, do a whole body skin exam, using a mirror to look at the skin on your back. Notify your caregiver of new moles, moles that have irregular borders, moles that are larger than a pencil eraser, or moles that have changed in shape or color.  Stay current with required immunizations.  Influenza. You need a dose every fall (or winter). The composition of the flu vaccine  changes each year, so being vaccinated once is not enough.  Pneumococcal polysaccharide. You need 1 to 2 doses if you smoke cigarettes or if you have certain chronic medical conditions. You need 1 dose at age 65 (or older) if you have never been vaccinated.  Tetanus, diphtheria, pertussis (Tdap, Td). Get 1 dose of Tdap vaccine if you are younger than age 65, are over 65 and have contact with an infant, are a healthcare worker, are pregnant, or simply want to be protected from whooping cough. After that, you need a Td   booster dose every 10 years. Consult your caregiver if you have not had at least 3 tetanus and diphtheria-containing shots sometime in your life or have a deep or dirty wound.  HPV. You need this vaccine if you are a woman age 26 or younger. The vaccine is given in 3 doses over 6 months.  Measles, mumps, rubella (MMR). You need at least 1 dose of MMR if you were born in 1957 or later. You may also need a second dose.  Meningococcal. If you are age 19 to 21 and a first-year college student living in a residence hall, or have one of several medical conditions, you need to get vaccinated against meningococcal disease. You may also need additional booster doses.  Zoster (shingles). If you are age 60 or older, you should get this vaccine.  Varicella (chickenpox). If you have never had chickenpox or you were vaccinated but received only 1 dose, talk to your caregiver to find out if you need this vaccine.  Hepatitis A. You need this vaccine if you have a specific risk factor for hepatitis A virus infection or you simply wish to be protected from this disease. The vaccine is usually given as 2 doses, 6 to 18 months apart.  Hepatitis B. You need this vaccine if you have a specific risk factor for hepatitis B virus infection or you simply wish to be protected from this disease. The vaccine is given in 3 doses, usually over 6 months. Preventive Services / Frequency Ages 19 to 39  Blood  pressure check.** / Every 1 to 2 years.  Lipid and cholesterol check.** / Every 5 years beginning at age 20.  Clinical breast exam.** / Every 3 years for women in their 20s and 30s.  Pap test.** / Every 2 years from ages 21 through 29. Every 3 years starting at age 30 through age 65 or 70 with a history of 3 consecutive normal Pap tests.  HPV screening.** / Every 3 years from ages 30 through ages 65 to 70 with a history of 3 consecutive normal Pap tests.  Hepatitis C blood test.** / For any individual with known risks for hepatitis C.  Skin self-exam. / Monthly.  Influenza immunization.** / Every year.  Pneumococcal polysaccharide immunization.** / 1 to 2 doses if you smoke cigarettes or if you have certain chronic medical conditions.  Tetanus, diphtheria, pertussis (Tdap, Td) immunization. / A one-time dose of Tdap vaccine. After that, you need a Td booster dose every 10 years.  HPV immunization. / 3 doses over 6 months, if you are 26 and younger.  Measles, mumps, rubella (MMR) immunization. / You need at least 1 dose of MMR if you were born in 1957 or later. You may also need a second dose.  Meningococcal immunization. / 1 dose if you are age 19 to 21 and a first-year college student living in a residence hall, or have one of several medical conditions, you need to get vaccinated against meningococcal disease. You may also need additional booster doses.  Varicella immunization.** / Consult your caregiver.  Hepatitis A immunization.** / Consult your caregiver. 2 doses, 6 to 18 months apart.  Hepatitis B immunization.** / Consult your caregiver. 3 doses usually over 6 months. Ages 40 to 64  Blood pressure check.** / Every 1 to 2 years.  Lipid and cholesterol check.** / Every 5 years beginning at age 20.  Clinical breast exam.** / Every year after age 40.  Mammogram.** / Every year beginning at age 40   and continuing for as long as you are in good health. Consult with your  caregiver.  Pap test.** / Every 3 years starting at age 30 through age 65 or 70 with a history of 3 consecutive normal Pap tests.  HPV screening.** / Every 3 years from ages 30 through ages 65 to 70 with a history of 3 consecutive normal Pap tests.  Fecal occult blood test (FOBT) of stool. / Every year beginning at age 50 and continuing until age 75. You may not need to do this test if you get a colonoscopy every 10 years.  Flexible sigmoidoscopy or colonoscopy.** / Every 5 years for a flexible sigmoidoscopy or every 10 years for a colonoscopy beginning at age 50 and continuing until age 75.  Hepatitis C blood test.** / For all people born from 1945 through 1965 and any individual with known risks for hepatitis C.  Skin self-exam. / Monthly.  Influenza immunization.** / Every year.  Pneumococcal polysaccharide immunization.** / 1 to 2 doses if you smoke cigarettes or if you have certain chronic medical conditions.  Tetanus, diphtheria, pertussis (Tdap, Td) immunization.** / A one-time dose of Tdap vaccine. After that, you need a Td booster dose every 10 years.  Measles, mumps, rubella (MMR) immunization. / You need at least 1 dose of MMR if you were born in 1957 or later. You may also need a second dose.  Varicella immunization.** / Consult your caregiver.  Meningococcal immunization.** / Consult your caregiver.  Hepatitis A immunization.** / Consult your caregiver. 2 doses, 6 to 18 months apart.  Hepatitis B immunization.** / Consult your caregiver. 3 doses, usually over 6 months. Ages 65 and over  Blood pressure check.** / Every 1 to 2 years.  Lipid and cholesterol check.** / Every 5 years beginning at age 20.  Clinical breast exam.** / Every year after age 40.  Mammogram.** / Every year beginning at age 40 and continuing for as long as you are in good health. Consult with your caregiver.  Pap test.** / Every 3 years starting at age 30 through age 65 or 70 with a 3  consecutive normal Pap tests. Testing can be stopped between 65 and 70 with 3 consecutive normal Pap tests and no abnormal Pap or HPV tests in the past 10 years.  HPV screening.** / Every 3 years from ages 30 through ages 65 or 70 with a history of 3 consecutive normal Pap tests. Testing can be stopped between 65 and 70 with 3 consecutive normal Pap tests and no abnormal Pap or HPV tests in the past 10 years.  Fecal occult blood test (FOBT) of stool. / Every year beginning at age 50 and continuing until age 75. You may not need to do this test if you get a colonoscopy every 10 years.  Flexible sigmoidoscopy or colonoscopy.** / Every 5 years for a flexible sigmoidoscopy or every 10 years for a colonoscopy beginning at age 50 and continuing until age 75.  Hepatitis C blood test.** / For all people born from 1945 through 1965 and any individual with known risks for hepatitis C.  Osteoporosis screening.** / A one-time screening for women ages 65 and over and women at risk for fractures or osteoporosis.  Skin self-exam. / Monthly.  Influenza immunization.** / Every year.  Pneumococcal polysaccharide immunization.** / 1 dose at age 65 (or older) if you have never been vaccinated.  Tetanus, diphtheria, pertussis (Tdap, Td) immunization. / A one-time dose of Tdap vaccine if you are over   65 and have contact with an infant, are a healthcare worker, or simply want to be protected from whooping cough. After that, you need a Td booster dose every 10 years.  Varicella immunization.** / Consult your caregiver.  Meningococcal immunization.** / Consult your caregiver.  Hepatitis A immunization.** / Consult your caregiver. 2 doses, 6 to 18 months apart.  Hepatitis B immunization.** / Check with your caregiver. 3 doses, usually over 6 months. ** Family history and personal history of risk and conditions may change your caregiver's recommendations. Document Released: 11/30/2001 Document Revised: 12/27/2011  Document Reviewed: 03/01/2011 [employer] Patient Information 2014 [employer].  

## 2013-05-10 ENCOUNTER — Encounter: Payer: Self-pay | Admitting: Obstetrics & Gynecology

## 2013-05-11 ENCOUNTER — Telehealth: Payer: Self-pay | Admitting: Obstetrics and Gynecology

## 2013-05-11 ENCOUNTER — Encounter: Payer: Self-pay | Admitting: Obstetrics & Gynecology

## 2013-05-11 NOTE — Telephone Encounter (Addendum)
Message copied by Toula Moos on Fri May 11, 2013  9:19 AM   ------ Ellen Lane patient, gave endo bx result as stated below. appt also made for the future order U/S on 05/18/2013 @1015  am. Patient satisfied.        Message from: Jaynie Collins A      Created: Thu May 10, 2013  4:15 PM       Benign endometrial biopsy. Please call to inform patient of results. ------

## 2013-05-11 NOTE — Addendum Note (Signed)
Addended by: Jaynie Collins A on: 05/11/2013 09:11 AM   Modules accepted: Orders

## 2013-05-18 ENCOUNTER — Ambulatory Visit (HOSPITAL_COMMUNITY): Payer: Medicare Other

## 2013-05-18 ENCOUNTER — Ambulatory Visit (HOSPITAL_COMMUNITY)
Admission: RE | Admit: 2013-05-18 | Discharge: 2013-05-18 | Disposition: A | Discharge status: Home / Self Care | Payer: Medicare Other | Source: Ambulatory Visit | Attending: Obstetrics & Gynecology | Admitting: Obstetrics & Gynecology

## 2013-05-18 DIAGNOSIS — N939 Abnormal uterine and vaginal bleeding, unspecified: Secondary | ICD-10-CM

## 2013-05-18 DIAGNOSIS — N949 Unspecified condition associated with female genital organs and menstrual cycle: Secondary | ICD-10-CM | POA: Insufficient documentation

## 2013-05-18 DIAGNOSIS — N938 Other specified abnormal uterine and vaginal bleeding: Secondary | ICD-10-CM | POA: Insufficient documentation

## 2013-05-18 DIAGNOSIS — G8220 Paraplegia, unspecified: Secondary | ICD-10-CM | POA: Insufficient documentation

## 2013-05-20 ENCOUNTER — Encounter: Payer: Self-pay | Admitting: Obstetrics & Gynecology

## 2013-05-28 ENCOUNTER — Encounter: Payer: Self-pay | Admitting: *Deleted

## 2013-05-29 ENCOUNTER — Telehealth: Payer: Self-pay | Admitting: *Deleted

## 2013-05-29 NOTE — Telephone Encounter (Signed)
Pt left message requesting US results.  

## 2013-05-30 NOTE — Telephone Encounter (Signed)
Called patient back stating I was returning her phone call from yesterday and her ultrasound came back normal. Patient verbalized understanding and had no further questions

## 2013-05-31 ENCOUNTER — Other Ambulatory Visit: Payer: Medicare Other

## 2013-05-31 DIAGNOSIS — B20 Human immunodeficiency virus [HIV] disease: Secondary | ICD-10-CM

## 2013-05-31 DIAGNOSIS — E785 Hyperlipidemia, unspecified: Secondary | ICD-10-CM

## 2013-05-31 LAB — LIPID PANEL
LDL Cholesterol: 116 mg/dL — ABNORMAL HIGH (ref 0–99)
VLDL: 17 mg/dL (ref 0–40)

## 2013-05-31 LAB — COMPLETE METABOLIC PANEL WITH GFR
ALT: 10 U/L (ref 0–35)
Albumin: 3.9 g/dL (ref 3.5–5.2)
Alkaline Phosphatase: 101 U/L (ref 39–117)
CO2: 23 mEq/L (ref 19–32)
GFR, Est African American: >89 mL/min
Glucose, Bld: 72 mg/dL (ref 70–99)
Potassium: 3.9 mEq/L (ref 3.5–5.3)
Sodium: 138 mEq/L (ref 135–145)
Total Bilirubin: 0.2 mg/dL — ABNORMAL LOW (ref 0.3–1.2)
Total Protein: 6.9 g/dL (ref 6.0–8.3)

## 2013-05-31 LAB — CBC WITH DIFFERENTIAL/PLATELET
Basophils Absolute: 0 10*3/uL (ref 0.0–0.1)
Basophils Relative: 0 % (ref 0–1)
Eosinophils Absolute: 0.1 10*3/uL (ref 0.0–0.7)
HCT: 35.9 % — ABNORMAL LOW (ref 36.0–46.0)
Hemoglobin: 11.7 g/dL — ABNORMAL LOW (ref 12.0–15.0)
MCH: 26.4 pg (ref 26.0–34.0)
MCHC: 32.6 g/dL (ref 30.0–36.0)
Monocytes Absolute: 0.4 10*3/uL (ref 0.1–1.0)
Monocytes Relative: 7 % (ref 3–12)
RDW: 15.7 % — ABNORMAL HIGH (ref 11.5–15.5)

## 2013-06-01 LAB — HIV-1 RNA QUANT-NO REFLEX-BLD
HIV 1 RNA Quant: <20 copies/mL (ref <20)
HIV-1 RNA Quant, Log: <1.3 {Log} (ref <1.30)

## 2013-06-01 LAB — T-HELPER CELL (CD4) - (RCID CLINIC ONLY): CD4 T Cell Abs: 680 uL (ref 400–2700)

## 2013-06-13 ENCOUNTER — Ambulatory Visit (INDEPENDENT_AMBULATORY_CARE_PROVIDER_SITE_OTHER): Payer: Medicare Other | Admitting: Infectious Disease

## 2013-06-13 ENCOUNTER — Encounter: Payer: Self-pay | Admitting: Infectious Disease

## 2013-06-13 VITALS — BP 129/82 | HR 59 | Temp 97.9°F

## 2013-06-13 DIAGNOSIS — R21 Rash and other nonspecific skin eruption: Secondary | ICD-10-CM | POA: Insufficient documentation

## 2013-06-13 DIAGNOSIS — Z23 Encounter for immunization: Secondary | ICD-10-CM

## 2013-06-13 DIAGNOSIS — E785 Hyperlipidemia, unspecified: Secondary | ICD-10-CM

## 2013-06-13 DIAGNOSIS — Z113 Encounter for screening for infections with a predominantly sexual mode of transmission: Secondary | ICD-10-CM

## 2013-06-13 DIAGNOSIS — I10 Essential (primary) hypertension: Secondary | ICD-10-CM

## 2013-06-13 DIAGNOSIS — B20 Human immunodeficiency virus [HIV] disease: Secondary | ICD-10-CM

## 2013-06-13 MED ORDER — PRAVASTATIN SODIUM 20 MG PO TABS: 20.0000 mg | ORAL_TABLET | Freq: Every day | ORAL | Status: DC
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
## 2013-07-31 DIAGNOSIS — E119 Type 2 diabetes mellitus without complications: Secondary | ICD-10-CM | POA: Insufficient documentation

## 2013-08-30 ENCOUNTER — Other Ambulatory Visit: Payer: Self-pay | Admitting: *Deleted

## 2013-08-30 DIAGNOSIS — B20 Human immunodeficiency virus [HIV] disease: Secondary | ICD-10-CM

## 2013-08-30 MED ORDER — EFAVIRENZ-EMTRICITAB-TENOFOVIR 600-200-300 MG PO TABS: ORAL_TABLET | ORAL | Status: DC

## 2013-09-17 ENCOUNTER — Emergency Department (HOSPITAL_COMMUNITY): Payer: Medicare Other

## 2013-09-17 ENCOUNTER — Emergency Department (HOSPITAL_COMMUNITY)
Admission: EM | Admit: 2013-09-17 | Discharge: 2013-09-18 | Disposition: A | Discharge status: Home / Self Care | Payer: Medicare Other | Attending: Emergency Medicine | Admitting: Emergency Medicine

## 2013-09-17 ENCOUNTER — Encounter: Payer: Self-pay | Admitting: *Deleted

## 2013-09-17 DIAGNOSIS — T5991XA Toxic effect of unspecified gases, fumes and vapors, accidental (unintentional), initial encounter: Secondary | ICD-10-CM | POA: Insufficient documentation

## 2013-09-17 DIAGNOSIS — B20 Human immunodeficiency virus [HIV] disease: Secondary | ICD-10-CM | POA: Insufficient documentation

## 2013-09-17 DIAGNOSIS — Y92009 Unspecified place in unspecified non-institutional (private) residence as the place of occurrence of the external cause: Secondary | ICD-10-CM | POA: Insufficient documentation

## 2013-09-17 DIAGNOSIS — Z7982 Long term (current) use of aspirin: Secondary | ICD-10-CM | POA: Insufficient documentation

## 2013-09-17 DIAGNOSIS — Y9389 Activity, other specified: Secondary | ICD-10-CM | POA: Insufficient documentation

## 2013-09-17 DIAGNOSIS — T59891A Toxic effect of other specified gases, fumes and vapors, accidental (unintentional), initial encounter: Secondary | ICD-10-CM | POA: Insufficient documentation

## 2013-09-17 DIAGNOSIS — IMO0002 Reserved for concepts with insufficient information to code with codable children: Secondary | ICD-10-CM | POA: Insufficient documentation

## 2013-09-17 DIAGNOSIS — G8220 Paraplegia, unspecified: Secondary | ICD-10-CM | POA: Insufficient documentation

## 2013-09-17 DIAGNOSIS — Z87828 Personal history of other (healed) physical injury and trauma: Secondary | ICD-10-CM | POA: Insufficient documentation

## 2013-09-17 DIAGNOSIS — Z79899 Other long term (current) drug therapy: Secondary | ICD-10-CM | POA: Insufficient documentation

## 2013-09-17 DIAGNOSIS — E119 Type 2 diabetes mellitus without complications: Secondary | ICD-10-CM | POA: Insufficient documentation

## 2013-09-17 DIAGNOSIS — M549 Dorsalgia, unspecified: Secondary | ICD-10-CM | POA: Insufficient documentation

## 2013-09-17 DIAGNOSIS — G894 Chronic pain syndrome: Secondary | ICD-10-CM | POA: Insufficient documentation

## 2013-09-17 DIAGNOSIS — Z993 Dependence on wheelchair: Secondary | ICD-10-CM | POA: Insufficient documentation

## 2013-09-17 DIAGNOSIS — I10 Essential (primary) hypertension: Secondary | ICD-10-CM | POA: Insufficient documentation

## 2013-09-17 DIAGNOSIS — Z794 Long term (current) use of insulin: Secondary | ICD-10-CM | POA: Insufficient documentation

## 2013-09-17 DIAGNOSIS — E669 Obesity, unspecified: Secondary | ICD-10-CM | POA: Insufficient documentation

## 2013-09-17 LAB — POCT I-STAT 3, ART BLOOD GAS (G3+)
Acid-base deficit: 5 mmol/L — ABNORMAL HIGH (ref 0.0–2.0)
O2 Saturation: 98 %
pO2, Arterial: 104 mmHg — ABNORMAL HIGH (ref 80.0–100.0)

## 2013-09-17 LAB — CBC WITH DIFFERENTIAL/PLATELET
Basophils Absolute: 0 10*3/uL (ref 0.0–0.1)
Basophils Relative: 0 % (ref 0–1)
Eosinophils Absolute: 0.1 10*3/uL (ref 0.0–0.7)
MCH: 27.7 pg (ref 26.0–34.0)
MCHC: 33 g/dL (ref 30.0–36.0)
Monocytes Relative: 7 % (ref 3–12)
Neutrophils Relative %: 60 % (ref 43–77)
Platelets: 180 10*3/uL (ref 150–400)
RDW: 14.1 % (ref 11.5–15.5)

## 2013-09-17 LAB — BASIC METABOLIC PANEL
BUN: 8 mg/dL (ref 6–23)
GFR calc Af Amer: >90 mL/min (ref >90)
GFR calc non Af Amer: >90 mL/min (ref >90)
Potassium: 3.9 mEq/L (ref 3.5–5.1)
Sodium: 141 mEq/L (ref 135–145)

## 2013-09-17 LAB — CARBOXYHEMOGLOBIN: Carboxyhemoglobin: 1.7 % — ABNORMAL HIGH (ref 0.5–1.5)

## 2013-09-17 MED ORDER — OXYCODONE-ACETAMINOPHEN 5-325 MG PO TABS
2.0000 | ORAL_TABLET | Freq: Once | ORAL | Status: AC
  Administered 2013-09-17: 2 via ORAL
  Filled 2013-09-17: qty 2
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
## 2013-10-15 ENCOUNTER — Other Ambulatory Visit: Payer: Self-pay | Admitting: *Deleted

## 2013-10-15 ENCOUNTER — Other Ambulatory Visit (INDEPENDENT_AMBULATORY_CARE_PROVIDER_SITE_OTHER): Payer: Medicare Other

## 2013-10-15 DIAGNOSIS — B20 Human immunodeficiency virus [HIV] disease: Secondary | ICD-10-CM

## 2013-10-15 DIAGNOSIS — E785 Hyperlipidemia, unspecified: Secondary | ICD-10-CM

## 2013-10-15 DIAGNOSIS — I10 Essential (primary) hypertension: Secondary | ICD-10-CM

## 2013-10-15 DIAGNOSIS — Z113 Encounter for screening for infections with a predominantly sexual mode of transmission: Secondary | ICD-10-CM

## 2013-10-15 LAB — CBC WITH DIFFERENTIAL/PLATELET
HCT: 37 % (ref 36.0–46.0)
Hemoglobin: 12.3 g/dL (ref 12.0–15.0)
Lymphocytes Relative: 36 % (ref 12–46)
Lymphs Abs: 1.6 10*3/uL (ref 0.7–4.0)
Monocytes Absolute: 0.3 10*3/uL (ref 0.1–1.0)
Monocytes Relative: 8 % (ref 3–12)
Neutro Abs: 2.5 10*3/uL (ref 1.7–7.7)
WBC: 4.5 10*3/uL (ref 4.0–10.5)

## 2013-10-15 LAB — LIPID PANEL
Cholesterol: 131 mg/dL (ref 0–200)
VLDL: 19 mg/dL (ref 0–40)

## 2013-10-15 LAB — COMPLETE METABOLIC PANEL WITH GFR
Albumin: 4 g/dL (ref 3.5–5.2)
BUN: 7 mg/dL (ref 6–23)
Calcium: 9 mg/dL (ref 8.4–10.5)
Chloride: 109 mEq/L (ref 96–112)
GFR, Est Non African American: >89 mL/min
Glucose, Bld: 78 mg/dL (ref 70–99)
Potassium: 4 mEq/L (ref 3.5–5.3)

## 2013-10-15 MED ORDER — LISINOPRIL 5 MG PO TABS: 5.0000 mg | ORAL_TABLET | Freq: Every day | ORAL | Status: DC

## 2013-10-16 LAB — HIV-1 RNA QUANT-NO REFLEX-BLD: HIV-1 RNA Quant, Log: <1.3 {Log} (ref <1.30)

## 2013-10-16 LAB — T-HELPER CELL (CD4) - (RCID CLINIC ONLY)
CD4 % Helper T Cell: 33 % (ref 33–55)
CD4 T Cell Abs: 520 /uL (ref 400–2700)

## 2013-10-29 ENCOUNTER — Ambulatory Visit: Payer: Medicare Other | Admitting: Infectious Disease

## 2013-11-06 ENCOUNTER — Encounter: Payer: Self-pay | Admitting: Infectious Disease

## 2013-11-06 ENCOUNTER — Ambulatory Visit (INDEPENDENT_AMBULATORY_CARE_PROVIDER_SITE_OTHER): Payer: Medicare Other | Admitting: Infectious Disease

## 2013-11-06 VITALS — BP 115/74 | HR 103 | Temp 97.8°F

## 2013-11-06 DIAGNOSIS — N10 Acute tubulo-interstitial nephritis: Secondary | ICD-10-CM | POA: Insufficient documentation

## 2013-11-06 DIAGNOSIS — N390 Urinary tract infection, site not specified: Secondary | ICD-10-CM

## 2013-11-06 DIAGNOSIS — A4902 Methicillin resistant Staphylococcus aureus infection, unspecified site: Secondary | ICD-10-CM

## 2013-11-06 DIAGNOSIS — Z113 Encounter for screening for infections with a predominantly sexual mode of transmission: Secondary | ICD-10-CM

## 2013-11-06 DIAGNOSIS — E1310 Other specified diabetes mellitus with ketoacidosis without coma: Secondary | ICD-10-CM

## 2013-11-06 DIAGNOSIS — B20 Human immunodeficiency virus [HIV] disease: Secondary | ICD-10-CM

## 2013-11-06 DIAGNOSIS — E785 Hyperlipidemia, unspecified: Secondary | ICD-10-CM

## 2013-11-06 DIAGNOSIS — E1110 Type 2 diabetes mellitus with ketoacidosis without coma: Secondary | ICD-10-CM

## 2013-11-06 MED ORDER — ONDANSETRON HCL 4 MG PO TABS: 4.0000 mg | ORAL_TABLET | Freq: Three times a day (TID) | ORAL | Status: DC | PRN

## 2013-11-06 MED ORDER — ELVITEG-COBIC-EMTRICIT-TENOFDF 150-150-200-300 MG PO TABS: 1.0000 | ORAL_TABLET | Freq: Every day | ORAL | Status: DC

## 2013-11-06 MED ORDER — CLINDAMYCIN PHOSPHATE 1 % EX GEL: Freq: Two times a day (BID) | CUTANEOUS | Status: DC

## 2013-11-06 MED ORDER — LEVOFLOXACIN 500 MG PO TABS: 500.0000 mg | ORAL_TABLET | Freq: Every day | ORAL | Status: DC
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
## 2013-11-07 ENCOUNTER — Other Ambulatory Visit: Payer: Medicare Other

## 2013-11-07 DIAGNOSIS — N390 Urinary tract infection, site not specified: Secondary | ICD-10-CM

## 2013-11-08 LAB — URINE CULTURE: Colony Count: >=100000

## 2013-11-08 LAB — URINALYSIS, MICROSCOPIC ONLY
CASTS: NONE SEEN
Crystals: NONE SEEN

## 2013-11-08 LAB — URINALYSIS, ROUTINE W REFLEX MICROSCOPIC
BILIRUBIN URINE: NEGATIVE
Glucose, UA: NEGATIVE mg/dL
KETONES UR: NEGATIVE mg/dL
NITRITE: POSITIVE — AB
PROTEIN: NEGATIVE mg/dL
SPECIFIC GRAVITY, URINE: 1.02 (ref 1.005–1.030)
UROBILINOGEN UA: 0.2 mg/dL (ref 0.0–1.0)
pH: 6 (ref 5.0–8.0)

## 2013-11-09 ENCOUNTER — Telehealth: Payer: Self-pay | Admitting: Infectious Disease

## 2013-11-09 MED ORDER — PROMETHAZINE HCL 25 MG PO TABS: 25.0000 mg | ORAL_TABLET | Freq: Four times a day (QID) | ORAL | Status: DC | PRN

## 2013-11-09 NOTE — Telephone Encounter (Signed)
Can we let Rocio, know that I have written for phenergan for nausea in case she cant get zofran( I dont understand why)

## 2013-11-09 NOTE — Telephone Encounter (Signed)
Per Dr Tommy Medal called patient and she advised she was not able to get the Zofran. Advised her of the phenergan Rx and to get it today and try it. She advised she is still vomiting and feels very bad. Advised her to try the new medication and to take the levoflocin with food and if she still does not feel better to give the office a call.

## 2013-11-09 NOTE — Telephone Encounter (Signed)
If she continues to feel bad have her go back to [place] and stop the [facility]

## 2013-12-06 ENCOUNTER — Ambulatory Visit: Payer: Medicare Other | Admitting: Infectious Disease

## 2013-12-10 ENCOUNTER — Telehealth: Payer: Self-pay | Admitting: Licensed Clinical Social Worker

## 2013-12-10 NOTE — Telephone Encounter (Signed)
Patient states that she will  stop the stribild and start Atripla today because she is still feeling bad. She wants to know is there another drug she can take that will not contribute to her symptoms of depression.

## 2013-12-10 NOTE — Telephone Encounter (Signed)
There are many possible drugs. Complera and TRIUMEQ might be options if she is looking to go with one pill once a day. She might also consider mix and match of best, least side effect pills FOR her and best for her heath. This might mean two pills rather than one though. She should come in for appt to discuss

## 2014-01-07 ENCOUNTER — Other Ambulatory Visit: Payer: Self-pay

## 2014-01-07 DIAGNOSIS — Z1231 Encounter for screening mammogram for malignant neoplasm of breast: Secondary | ICD-10-CM

## 2014-01-25 ENCOUNTER — Ambulatory Visit
Admission: RE | Admit: 2014-01-25 | Discharge: 2014-01-25 | Disposition: A | Discharge status: Home / Self Care | Payer: Medicare Other | Source: Ambulatory Visit

## 2014-01-25 ENCOUNTER — Other Ambulatory Visit: Payer: Self-pay

## 2014-01-25 DIAGNOSIS — Z1231 Encounter for screening mammogram for malignant neoplasm of breast: Secondary | ICD-10-CM

## 2014-03-12 ENCOUNTER — Other Ambulatory Visit: Payer: Self-pay | Admitting: *Deleted

## 2014-03-12 DIAGNOSIS — I10 Essential (primary) hypertension: Secondary | ICD-10-CM

## 2014-03-12 MED ORDER — LISINOPRIL 5 MG PO TABS: 5.0000 mg | ORAL_TABLET | Freq: Every day | ORAL | Status: DC

## 2014-05-06 ENCOUNTER — Other Ambulatory Visit: Payer: Medicare Other

## 2014-05-20 ENCOUNTER — Encounter: Payer: Self-pay | Admitting: Infectious Disease

## 2014-05-20 ENCOUNTER — Ambulatory Visit (INDEPENDENT_AMBULATORY_CARE_PROVIDER_SITE_OTHER): Payer: Medicare Other | Admitting: Infectious Disease

## 2014-05-20 VITALS — BP 110/73 | HR 62 | Temp 97.5°F

## 2014-05-20 DIAGNOSIS — E785 Hyperlipidemia, unspecified: Secondary | ICD-10-CM

## 2014-05-20 DIAGNOSIS — G43001 Migraine without aura, not intractable, with status migrainosus: Secondary | ICD-10-CM

## 2014-05-20 DIAGNOSIS — I10 Essential (primary) hypertension: Secondary | ICD-10-CM

## 2014-05-20 DIAGNOSIS — B20 Human immunodeficiency virus [HIV] disease: Secondary | ICD-10-CM

## 2014-05-20 LAB — CBC WITH DIFFERENTIAL/PLATELET
BASOS PCT: 0 % (ref 0–1)
Basophils Absolute: 0 10*3/uL (ref 0.0–0.1)
Eosinophils Absolute: 0.1 10*3/uL (ref 0.0–0.7)
Eosinophils Relative: 1 % (ref 0–5)
HCT: 37.8 % (ref 36.0–46.0)
HEMOGLOBIN: 12.6 g/dL (ref 12.0–15.0)
LYMPHS ABS: 2.1 10*3/uL (ref 0.7–4.0)
LYMPHS PCT: 37 % (ref 12–46)
MCH: 26.8 pg (ref 26.0–34.0)
MCHC: 33.3 g/dL (ref 30.0–36.0)
MCV: 80.3 fL (ref 78.0–100.0)
MONOS PCT: 6 % (ref 3–12)
Monocytes Absolute: 0.3 10*3/uL (ref 0.1–1.0)
NEUTROS ABS: 3.1 10*3/uL (ref 1.7–7.7)
Neutrophils Relative %: 56 % (ref 43–77)
Platelets: 198 10*3/uL (ref 150–400)
RBC: 4.71 MIL/uL (ref 3.87–5.11)
RDW: 15.7 % — ABNORMAL HIGH (ref 11.5–15.5)
WBC: 5.6 10*3/uL (ref 4.0–10.5)

## 2014-05-20 LAB — COMPLETE METABOLIC PANEL WITH GFR
ALK PHOS: 152 U/L — AB (ref 39–117)
ALT: 17 U/L (ref 0–35)
AST: 12 U/L (ref 0–37)
Albumin: 4.1 g/dL (ref 3.5–5.2)
BUN: 15 mg/dL (ref 6–23)
CO2: 21 meq/L (ref 19–32)
CREATININE: 0.56 mg/dL (ref 0.50–1.10)
Calcium: 9 mg/dL (ref 8.4–10.5)
Chloride: 108 mEq/L (ref 96–112)
GFR, Est African American: >89 mL/min
Glucose, Bld: 165 mg/dL — ABNORMAL HIGH (ref 70–99)
Potassium: 4.6 mEq/L (ref 3.5–5.3)
Sodium: 139 mEq/L (ref 135–145)
Total Bilirubin: 0.2 mg/dL (ref 0.2–1.2)
Total Protein: 7 g/dL (ref 6.0–8.3)

## 2014-05-20 MED ORDER — EFAVIRENZ-EMTRICITAB-TENOFOVIR 600-200-300 MG PO TABS: 1.0000 | ORAL_TABLET | Freq: Every day | ORAL | Status: DC
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
## 2014-05-21 LAB — RPR

## 2014-05-21 LAB — T-HELPER CELL (CD4) - (RCID CLINIC ONLY)
CD4 % Helper T Cell: 32 % — ABNORMAL LOW (ref 33–55)
CD4 T Cell Abs: 660 /uL (ref 400–2700)

## 2014-05-22 LAB — HIV-1 RNA QUANT-NO REFLEX-BLD: HIV-1 RNA Quant, Log: <1.3 {Log} (ref <1.30)

## 2014-07-01 ENCOUNTER — Ambulatory Visit (INDEPENDENT_AMBULATORY_CARE_PROVIDER_SITE_OTHER): Payer: Medicare Other | Admitting: Infectious Disease

## 2014-07-01 ENCOUNTER — Encounter: Payer: Self-pay | Admitting: Infectious Disease

## 2014-07-01 VITALS — BP 140/83 | HR 82 | Temp 97.8°F

## 2014-07-01 DIAGNOSIS — E1159 Type 2 diabetes mellitus with other circulatory complications: Secondary | ICD-10-CM

## 2014-07-01 DIAGNOSIS — R569 Unspecified convulsions: Secondary | ICD-10-CM

## 2014-07-01 DIAGNOSIS — G8220 Paraplegia, unspecified: Secondary | ICD-10-CM

## 2014-07-01 DIAGNOSIS — B20 Human immunodeficiency virus [HIV] disease: Secondary | ICD-10-CM

## 2014-07-01 DIAGNOSIS — E785 Hyperlipidemia, unspecified: Secondary | ICD-10-CM
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
## 2014-07-03 ENCOUNTER — Ambulatory Visit: Payer: Medicare Other | Admitting: Infectious Disease

## 2014-07-03 ENCOUNTER — Other Ambulatory Visit: Payer: Self-pay

## 2014-07-03 DIAGNOSIS — R21 Rash and other nonspecific skin eruption: Secondary | ICD-10-CM

## 2014-07-03 MED ORDER — PRAVASTATIN SODIUM 20 MG PO TABS: 20.0000 mg | ORAL_TABLET | Freq: Every day | ORAL | Status: DC

## 2014-08-02 ENCOUNTER — Other Ambulatory Visit: Payer: Self-pay

## 2014-08-19 ENCOUNTER — Encounter: Payer: Self-pay | Admitting: Infectious Disease

## 2014-08-20 DIAGNOSIS — N390 Urinary tract infection, site not specified: Secondary | ICD-10-CM | POA: Insufficient documentation

## 2014-09-17 ENCOUNTER — Telehealth: Payer: Self-pay | Admitting: *Deleted

## 2014-09-17 NOTE — Telephone Encounter (Signed)
Pt called the clinic stating she had not received about pap smear results. Contacted patient, pt does not need Pap at this time but does need to schedule yearly exam.  Pt verbalizes understanding.

## 2014-12-02 ENCOUNTER — Ambulatory Visit: Payer: Medicare Other | Admitting: Obstetrics & Gynecology

## 2014-12-18 ENCOUNTER — Other Ambulatory Visit: Payer: Medicare Other

## 2014-12-19 ENCOUNTER — Other Ambulatory Visit (HOSPITAL_COMMUNITY): Payer: Self-pay | Admitting: Surgery

## 2014-12-19 DIAGNOSIS — E089 Diabetes mellitus due to underlying condition without complications: Secondary | ICD-10-CM

## 2014-12-19 DIAGNOSIS — E46 Unspecified protein-calorie malnutrition: Secondary | ICD-10-CM

## 2014-12-19 DIAGNOSIS — E785 Hyperlipidemia, unspecified: Secondary | ICD-10-CM

## 2014-12-19 DIAGNOSIS — Z6841 Body Mass Index (BMI) 40.0 and over, adult: Secondary | ICD-10-CM

## 2014-12-24 ENCOUNTER — Ambulatory Visit (HOSPITAL_COMMUNITY)
Admission: RE | Admit: 2014-12-24 | Discharge: 2014-12-24 | Disposition: A | Discharge status: Home / Self Care | Payer: Medicare HMO | Source: Ambulatory Visit | Attending: Surgery | Admitting: Surgery

## 2014-12-24 ENCOUNTER — Other Ambulatory Visit (HOSPITAL_COMMUNITY): Payer: Self-pay | Admitting: Surgery

## 2014-12-24 DIAGNOSIS — Z6841 Body Mass Index (BMI) 40.0 and over, adult: Secondary | ICD-10-CM

## 2014-12-24 DIAGNOSIS — E785 Hyperlipidemia, unspecified: Secondary | ICD-10-CM | POA: Insufficient documentation

## 2014-12-24 DIAGNOSIS — E46 Unspecified protein-calorie malnutrition: Secondary | ICD-10-CM | POA: Insufficient documentation

## 2014-12-24 DIAGNOSIS — Z01818 Encounter for other preprocedural examination: Secondary | ICD-10-CM | POA: Insufficient documentation

## 2014-12-24 DIAGNOSIS — G8220 Paraplegia, unspecified: Secondary | ICD-10-CM | POA: Diagnosis not present

## 2014-12-24 DIAGNOSIS — K3189 Other diseases of stomach and duodenum: Secondary | ICD-10-CM | POA: Diagnosis not present

## 2014-12-24 DIAGNOSIS — E089 Diabetes mellitus due to underlying condition without complications: Secondary | ICD-10-CM | POA: Diagnosis not present

## 2015-01-01 ENCOUNTER — Other Ambulatory Visit (HOSPITAL_COMMUNITY)
Admission: RE | Admit: 2015-01-01 | Discharge: 2015-01-01 | Disposition: A | Discharge status: Home / Self Care | Payer: Medicare HMO | Source: Ambulatory Visit | Attending: Infectious Disease | Admitting: Infectious Disease

## 2015-01-01 ENCOUNTER — Ambulatory Visit (INDEPENDENT_AMBULATORY_CARE_PROVIDER_SITE_OTHER): Payer: Medicare HMO | Admitting: Infectious Disease

## 2015-01-01 ENCOUNTER — Encounter: Payer: Self-pay | Admitting: Infectious Disease

## 2015-01-01 VITALS — BP 118/81 | HR 79 | Temp 97.6°F

## 2015-01-01 DIAGNOSIS — Z79899 Other long term (current) drug therapy: Secondary | ICD-10-CM | POA: Diagnosis not present

## 2015-01-01 DIAGNOSIS — I10 Essential (primary) hypertension: Secondary | ICD-10-CM

## 2015-01-01 DIAGNOSIS — Z113 Encounter for screening for infections with a predominantly sexual mode of transmission: Secondary | ICD-10-CM | POA: Insufficient documentation

## 2015-01-01 DIAGNOSIS — E1059 Type 1 diabetes mellitus with other circulatory complications: Secondary | ICD-10-CM

## 2015-01-01 DIAGNOSIS — B20 Human immunodeficiency virus [HIV] disease: Secondary | ICD-10-CM

## 2015-01-01 DIAGNOSIS — G8220 Paraplegia, unspecified: Secondary | ICD-10-CM

## 2015-01-01 LAB — COMPLETE METABOLIC PANEL WITH GFR
ALBUMIN: 3.9 g/dL (ref 3.5–5.2)
ALT: 11 U/L (ref 0–35)
AST: 10 U/L (ref 0–37)
Alkaline Phosphatase: 144 U/L — ABNORMAL HIGH (ref 39–117)
BILIRUBIN TOTAL: 0.2 mg/dL (ref 0.2–1.2)
BUN: 14 mg/dL (ref 6–23)
CO2: 18 meq/L — AB (ref 19–32)
Calcium: 9 mg/dL (ref 8.4–10.5)
Chloride: 108 mEq/L (ref 96–112)
Creat: 0.58 mg/dL (ref 0.50–1.10)
GFR, Est African American: >89 mL/min
GLUCOSE: 105 mg/dL — AB (ref 70–99)
Potassium: 4.1 mEq/L (ref 3.5–5.3)
Sodium: 137 mEq/L (ref 135–145)
Total Protein: 6.6 g/dL (ref 6.0–8.3)

## 2015-01-01 LAB — CBC WITH DIFFERENTIAL/PLATELET
Basophils Absolute: 0 10*3/uL (ref 0.0–0.1)
Basophils Relative: 0 % (ref 0–1)
EOS ABS: 0.1 10*3/uL (ref 0.0–0.7)
Eosinophils Relative: 1 % (ref 0–5)
HCT: 37.7 % (ref 36.0–46.0)
HEMOGLOBIN: 12 g/dL (ref 12.0–15.0)
Lymphocytes Relative: 35 % (ref 12–46)
Lymphs Abs: 1.9 10*3/uL (ref 0.7–4.0)
MCH: 27.3 pg (ref 26.0–34.0)
MCHC: 31.8 g/dL (ref 30.0–36.0)
MCV: 85.9 fL (ref 78.0–100.0)
MONO ABS: 0.4 10*3/uL (ref 0.1–1.0)
MONOS PCT: 7 % (ref 3–12)
MPV: 11.8 fL (ref 8.6–12.4)
NEUTROS PCT: 57 % (ref 43–77)
Neutro Abs: 3.1 10*3/uL (ref 1.7–7.7)
PLATELETS: 218 10*3/uL (ref 150–400)
RBC: 4.39 MIL/uL (ref 3.87–5.11)
RDW: 15.6 % — ABNORMAL HIGH (ref 11.5–15.5)
WBC: 5.5 10*3/uL (ref 4.0–10.5)
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
## 2015-01-02 LAB — RPR

## 2015-01-02 LAB — T-HELPER CELL (CD4) - (RCID CLINIC ONLY)
CD4 % Helper T Cell: 36 % (ref 33–55)
CD4 T CELL ABS: 710 /uL (ref 400–2700)

## 2015-01-03 LAB — MICROALBUMIN / CREATININE URINE RATIO
CREATININE, URINE: 149.7 mg/dL
MICROALB/CREAT RATIO: 16.7 mg/g (ref 0.0–30.0)
Microalb, Ur: 2.5 mg/dL — ABNORMAL HIGH (ref <2.0)

## 2015-01-03 LAB — HIV-1 RNA QUANT-NO REFLEX-BLD

## 2015-01-03 LAB — URINE CYTOLOGY ANCILLARY ONLY
Chlamydia: NEGATIVE
Neisseria Gonorrhea: NEGATIVE

## 2015-01-29 ENCOUNTER — Other Ambulatory Visit (HOSPITAL_COMMUNITY)
Admission: RE | Admit: 2015-01-29 | Discharge: 2015-01-29 | Disposition: A | Discharge status: Home / Self Care | Payer: Medicare HMO | Source: Ambulatory Visit | Attending: Obstetrics & Gynecology | Admitting: Obstetrics & Gynecology

## 2015-01-29 ENCOUNTER — Encounter: Payer: Self-pay | Admitting: Obstetrics & Gynecology

## 2015-01-29 ENCOUNTER — Ambulatory Visit (INDEPENDENT_AMBULATORY_CARE_PROVIDER_SITE_OTHER): Payer: Medicare HMO | Admitting: Obstetrics & Gynecology

## 2015-01-29 VITALS — BP 131/72 | HR 72 | Ht 62.0 in

## 2015-01-29 DIAGNOSIS — Z1151 Encounter for screening for human papillomavirus (HPV): Secondary | ICD-10-CM | POA: Insufficient documentation

## 2015-01-29 DIAGNOSIS — Z124 Encounter for screening for malignant neoplasm of cervix: Secondary | ICD-10-CM

## 2015-01-29 DIAGNOSIS — G8220 Paraplegia, unspecified: Secondary | ICD-10-CM | POA: Diagnosis not present

## 2015-01-29 DIAGNOSIS — Z01419 Encounter for gynecological examination (general) (routine) without abnormal findings: Secondary | ICD-10-CM | POA: Diagnosis not present
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
## 2015-01-30 LAB — CYTOLOGY - PAP

## 2015-04-14 ENCOUNTER — Other Ambulatory Visit: Payer: Self-pay

## 2015-04-29 ENCOUNTER — Other Ambulatory Visit: Payer: Self-pay | Admitting: *Deleted

## 2015-04-29 DIAGNOSIS — B20 Human immunodeficiency virus [HIV] disease: Secondary | ICD-10-CM

## 2015-04-29 MED ORDER — LISINOPRIL 5 MG PO TABS: 5.0000 mg | ORAL_TABLET | Freq: Every day | ORAL | Status: DC

## 2015-06-03 ENCOUNTER — Other Ambulatory Visit: Payer: Self-pay | Admitting: *Deleted

## 2015-06-03 MED ORDER — EFAVIRENZ-EMTRICITAB-TENOFOVIR 600-200-300 MG PO TABS: 1.0000 | ORAL_TABLET | Freq: Every day | ORAL | Status: DC

## 2015-06-18 ENCOUNTER — Other Ambulatory Visit: Payer: Medicare HMO

## 2015-06-18 DIAGNOSIS — Z113 Encounter for screening for infections with a predominantly sexual mode of transmission: Secondary | ICD-10-CM

## 2015-06-18 DIAGNOSIS — Z79899 Other long term (current) drug therapy: Secondary | ICD-10-CM

## 2015-06-18 DIAGNOSIS — B20 Human immunodeficiency virus [HIV] disease: Secondary | ICD-10-CM

## 2015-06-18 LAB — CBC WITH DIFFERENTIAL/PLATELET
BASOS ABS: 0 10*3/uL (ref 0.0–0.1)
Basophils Relative: 0 % (ref 0–1)
Eosinophils Absolute: 0.2 10*3/uL (ref 0.0–0.7)
Eosinophils Relative: 3 % (ref 0–5)
HEMATOCRIT: 36 % (ref 36.0–46.0)
Hemoglobin: 11.7 g/dL — ABNORMAL LOW (ref 12.0–15.0)
LYMPHS PCT: 36 % (ref 12–46)
Lymphs Abs: 1.9 10*3/uL (ref 0.7–4.0)
MCH: 27.9 pg (ref 26.0–34.0)
MCHC: 32.5 g/dL (ref 30.0–36.0)
MCV: 85.7 fL (ref 78.0–100.0)
MPV: 11.2 fL (ref 8.6–12.4)
Monocytes Absolute: 0.3 10*3/uL (ref 0.1–1.0)
Monocytes Relative: 6 % (ref 3–12)
NEUTROS ABS: 3 10*3/uL (ref 1.7–7.7)
NEUTROS PCT: 55 % (ref 43–77)
Platelets: 179 10*3/uL (ref 150–400)
RBC: 4.2 MIL/uL (ref 3.87–5.11)
RDW: 15.9 % — ABNORMAL HIGH (ref 11.5–15.5)
WBC: 5.4 10*3/uL (ref 4.0–10.5)

## 2015-06-18 LAB — COMPLETE METABOLIC PANEL WITH GFR
ALT: 17 U/L (ref 6–29)
AST: 13 U/L (ref 10–35)
Albumin: 4 g/dL (ref 3.6–5.1)
Alkaline Phosphatase: 154 U/L — ABNORMAL HIGH (ref 33–115)
BILIRUBIN TOTAL: 0.2 mg/dL (ref 0.2–1.2)
BUN: 12 mg/dL (ref 7–25)
CALCIUM: 9 mg/dL (ref 8.6–10.2)
CO2: 20 mmol/L (ref 20–31)
CREATININE: 0.59 mg/dL (ref 0.50–1.10)
Chloride: 110 mmol/L (ref 98–110)
GFR, Est Non African American: >89 mL/min (ref >=60)
Glucose, Bld: 111 mg/dL — ABNORMAL HIGH (ref 65–99)
Potassium: 3.9 mmol/L (ref 3.5–5.3)
Sodium: 141 mmol/L (ref 135–146)
TOTAL PROTEIN: 6.4 g/dL (ref 6.1–8.1)

## 2015-06-18 LAB — LIPID PANEL
Cholesterol: 131 mg/dL (ref 125–200)
HDL: 35 mg/dL — ABNORMAL LOW (ref >=46)
LDL CALC: 74 mg/dL (ref <130)
TRIGLYCERIDES: 108 mg/dL (ref <150)
Total CHOL/HDL Ratio: 3.7 Ratio (ref <=5.0)
VLDL: 22 mg/dL (ref <30)

## 2015-06-19 LAB — T-HELPER CELL (CD4) - (RCID CLINIC ONLY)
CD4 % Helper T Cell: 37 % (ref 33–55)
CD4 T Cell Abs: 780 /uL (ref 400–2700)

## 2015-06-19 LAB — RPR

## 2015-06-19 LAB — HIV-1 RNA QUANT-NO REFLEX-BLD: HIV 1 RNA Quant: <20 copies/mL (ref <20)

## 2015-07-02 ENCOUNTER — Ambulatory Visit: Payer: Medicare Other | Admitting: Infectious Disease

## 2015-07-07 ENCOUNTER — Ambulatory Visit: Payer: Medicare Other | Admitting: Infectious Disease

## 2015-07-21 ENCOUNTER — Ambulatory Visit (INDEPENDENT_AMBULATORY_CARE_PROVIDER_SITE_OTHER): Payer: Medicare HMO | Admitting: Infectious Disease

## 2015-07-21 ENCOUNTER — Encounter: Payer: Self-pay | Admitting: Infectious Disease

## 2015-07-21 VITALS — BP 128/70 | HR 98 | Temp 98.0°F

## 2015-07-21 DIAGNOSIS — I10 Essential (primary) hypertension: Secondary | ICD-10-CM

## 2015-07-21 DIAGNOSIS — F332 Major depressive disorder, recurrent severe without psychotic features: Secondary | ICD-10-CM

## 2015-07-21 DIAGNOSIS — R45851 Suicidal ideations: Secondary | ICD-10-CM

## 2015-07-21 DIAGNOSIS — Z794 Long term (current) use of insulin: Secondary | ICD-10-CM

## 2015-07-21 DIAGNOSIS — E0829 Diabetes mellitus due to underlying condition with other diabetic kidney complication: Secondary | ICD-10-CM

## 2015-07-21 DIAGNOSIS — B20 Human immunodeficiency virus [HIV] disease: Secondary | ICD-10-CM | POA: Diagnosis not present

## 2015-07-21 DIAGNOSIS — G43909 Migraine, unspecified, not intractable, without status migrainosus: Secondary | ICD-10-CM

## 2015-07-21 DIAGNOSIS — G8220 Paraplegia, unspecified: Secondary | ICD-10-CM

## 2015-07-21 DIAGNOSIS — G43001 Migraine without aura, not intractable, with status migrainosus: Secondary | ICD-10-CM

## 2015-07-21 HISTORY — DX: Migraine, unspecified, not intractable, without status migrainosus: G43.909

## 2015-07-21 HISTORY — DX: Suicidal ideations: R45.851

## 2015-07-21 HISTORY — DX: Major depressive disorder, recurrent severe without psychotic features: F33.2

## 2015-07-21 MED ORDER — EMTRICITAB-RILPIVIR-TENOFOV AF 200-25-25 MG PO TABS
1.0000 | ORAL_TABLET | Freq: Every day | ORAL | Status: DC
Start: 2015-07-21 — End: 2016-05-28

## 2015-07-21 MED ORDER — ESCITALOPRAM OXALATE 10 MG PO TABS: 10.0000 mg | ORAL_TABLET | Freq: Every day | ORAL | Status: DC
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
## 2015-07-28 ENCOUNTER — Other Ambulatory Visit: Payer: Self-pay

## 2015-07-28 ENCOUNTER — Telehealth: Payer: Self-pay | Admitting: *Deleted

## 2015-07-28 DIAGNOSIS — Z1231 Encounter for screening mammogram for malignant neoplasm of breast: Secondary | ICD-10-CM

## 2015-07-28 NOTE — Telephone Encounter (Signed)
PA needed for [facility].  RN contacted [facility], initiated the authorization request. Reference [id number]. Should have answer in 24-72 hours.  Pharmacy adn patient notified. Landis Gandy, RN

## 2015-07-29 NOTE — Telephone Encounter (Signed)
Approved through 10/29/2015. Notified patient and pharmacy.

## 2015-07-30 ENCOUNTER — Ambulatory Visit: Payer: Medicare HMO

## 2015-08-04 ENCOUNTER — Ambulatory Visit: Payer: Medicare HMO

## 2015-08-27 ENCOUNTER — Ambulatory Visit
Admission: RE | Admit: 2015-08-27 | Discharge: 2015-08-27 | Disposition: A | Discharge status: Home / Self Care | Payer: Medicare HMO | Source: Ambulatory Visit

## 2015-08-27 DIAGNOSIS — Z1231 Encounter for screening mammogram for malignant neoplasm of breast: Secondary | ICD-10-CM

## 2016-03-25 ENCOUNTER — Other Ambulatory Visit: Payer: Self-pay | Admitting: Surgery

## 2016-03-25 ENCOUNTER — Other Ambulatory Visit (HOSPITAL_COMMUNITY): Payer: Self-pay | Admitting: Surgery

## 2016-03-25 DIAGNOSIS — E119 Type 2 diabetes mellitus without complications: Secondary | ICD-10-CM

## 2016-03-25 DIAGNOSIS — G8220 Paraplegia, unspecified: Secondary | ICD-10-CM

## 2016-03-25 DIAGNOSIS — Z794 Long term (current) use of insulin: Secondary | ICD-10-CM

## 2016-03-25 DIAGNOSIS — I10 Essential (primary) hypertension: Secondary | ICD-10-CM

## 2016-03-31 ENCOUNTER — Other Ambulatory Visit (HOSPITAL_COMMUNITY): Payer: Self-pay | Admitting: Surgery

## 2016-03-31 ENCOUNTER — Ambulatory Visit (HOSPITAL_COMMUNITY)
Admission: RE | Admit: 2016-03-31 | Discharge: 2016-03-31 | Disposition: A | Discharge status: Home / Self Care | Payer: Medicare HMO | Source: Ambulatory Visit | Attending: Surgery | Admitting: Surgery

## 2016-03-31 DIAGNOSIS — E119 Type 2 diabetes mellitus without complications: Secondary | ICD-10-CM | POA: Diagnosis not present

## 2016-03-31 DIAGNOSIS — I10 Essential (primary) hypertension: Secondary | ICD-10-CM

## 2016-03-31 DIAGNOSIS — Z794 Long term (current) use of insulin: Secondary | ICD-10-CM | POA: Insufficient documentation

## 2016-03-31 DIAGNOSIS — G8220 Paraplegia, unspecified: Secondary | ICD-10-CM

## 2016-04-12 ENCOUNTER — Ambulatory Visit: Payer: Medicare HMO | Admitting: Obstetrics & Gynecology

## 2016-04-12 ENCOUNTER — Telehealth: Payer: Self-pay | Admitting: Obstetrics & Gynecology

## 2016-04-12 NOTE — Telephone Encounter (Signed)
Faculty Practice OB/GYN Attending Phone Call Documentation  I placed a call to Ellen Lane who was scheduled for her annual examination today.  She reports that she is coming to get a pap smear, no gynecologic concerns.  Patient had a negative pap smear with negative HRHPV on 01/29/15. Patient declined to come in today as she did not need a pap smear, she will call for any other GYN concerns.  [name] staff notified, appointment cancelled.    Verita Schneiders, MD, [place] Attending [place], [place] for [place]

## 2016-04-26 ENCOUNTER — Ambulatory Visit: Payer: Medicare HMO | Admitting: Infectious Disease

## 2016-05-11 ENCOUNTER — Ambulatory Visit: Payer: Medicare HMO | Admitting: *Deleted

## 2016-05-11 ENCOUNTER — Ambulatory Visit (INDEPENDENT_AMBULATORY_CARE_PROVIDER_SITE_OTHER): Payer: Medicare HMO | Admitting: Infectious Disease

## 2016-05-11 VITALS — Temp 97.8°F

## 2016-05-11 DIAGNOSIS — Z23 Encounter for immunization: Secondary | ICD-10-CM | POA: Diagnosis not present

## 2016-05-11 DIAGNOSIS — Z79899 Other long term (current) drug therapy: Secondary | ICD-10-CM

## 2016-05-11 DIAGNOSIS — I10 Essential (primary) hypertension: Secondary | ICD-10-CM

## 2016-05-11 DIAGNOSIS — E1100 Type 2 diabetes mellitus with hyperosmolarity without nonketotic hyperglycemic-hyperosmolar coma (NKHHC): Secondary | ICD-10-CM

## 2016-05-11 DIAGNOSIS — F332 Major depressive disorder, recurrent severe without psychotic features: Secondary | ICD-10-CM | POA: Diagnosis not present

## 2016-05-11 DIAGNOSIS — Z21 Asymptomatic human immunodeficiency virus [HIV] infection status: Secondary | ICD-10-CM

## 2016-05-11 DIAGNOSIS — B20 Human immunodeficiency virus [HIV] disease: Secondary | ICD-10-CM

## 2016-05-11 DIAGNOSIS — G8220 Paraplegia, unspecified: Secondary | ICD-10-CM

## 2016-05-11 NOTE — Patient Instructions (Signed)
We will check labs today  STOP the ODEFSEY for now  Make an appt with Cassie in 2 weeks
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
## 2016-05-12 LAB — CBC WITH DIFFERENTIAL/PLATELET
BASOS ABS: 0 {cells}/uL (ref 0–200)
Basophils Relative: 0 %
EOS ABS: 310 {cells}/uL (ref 15–500)
EOS PCT: 5 %
HCT: 38.8 % (ref 35.0–45.0)
Hemoglobin: 12.2 g/dL (ref 11.7–15.5)
LYMPHS PCT: 42 %
Lymphs Abs: 2604 cells/uL (ref 850–3900)
MCH: 26.5 pg — AB (ref 27.0–33.0)
MCHC: 31.4 g/dL — AB (ref 32.0–36.0)
MCV: 84.3 fL (ref 80.0–100.0)
MONOS PCT: 5 %
MPV: 11.4 fL (ref 7.5–12.5)
Monocytes Absolute: 310 cells/uL (ref 200–950)
NEUTROS PCT: 48 %
Neutro Abs: 2976 cells/uL (ref 1500–7800)
PLATELETS: 198 10*3/uL (ref 140–400)
RBC: 4.6 MIL/uL (ref 3.80–5.10)
RDW: 14.5 % (ref 11.0–15.0)
WBC: 6.2 10*3/uL (ref 3.8–10.8)

## 2016-05-12 LAB — COMPLETE METABOLIC PANEL WITH GFR
ALBUMIN: 3.8 g/dL (ref 3.6–5.1)
ALK PHOS: 83 U/L (ref 33–115)
ALT: 7 U/L (ref 6–29)
AST: 7 U/L — ABNORMAL LOW (ref 10–35)
BUN: 13 mg/dL (ref 7–25)
CO2: 21 mmol/L (ref 20–31)
Calcium: 9.2 mg/dL (ref 8.6–10.2)
Chloride: 106 mmol/L (ref 98–110)
Creat: 0.69 mg/dL (ref 0.50–1.10)
GFR, Est African American: >89 mL/min (ref >=60)
GFR, Est Non African American: >89 mL/min (ref >=60)
GLUCOSE: 137 mg/dL — AB (ref 65–99)
Potassium: 4.4 mmol/L (ref 3.5–5.3)
SODIUM: 138 mmol/L (ref 135–146)
TOTAL PROTEIN: 6.4 g/dL (ref 6.1–8.1)
Total Bilirubin: 0.3 mg/dL (ref 0.2–1.2)

## 2016-05-12 LAB — LIPID PANEL
CHOL/HDL RATIO: 3.3 ratio (ref <=5.0)
CHOLESTEROL: 119 mg/dL — AB (ref 125–200)
HDL: 36 mg/dL — ABNORMAL LOW (ref >=46)
LDL Cholesterol: 65 mg/dL (ref <130)
Triglycerides: 92 mg/dL (ref <150)
VLDL: 18 mg/dL (ref <30)

## 2016-05-12 LAB — RPR

## 2016-05-12 NOTE — BH Specialist Note (Signed)
Counselor met with Runette in the exam room for a warm hand off today.  Patient was oriented times four with good affect and dress.  Patient was alert and talkative.  Patient was in good spirits and stated that things were going much better but still has a lot going on . Counselor encouraged patient to seek the counseling services if and when she needed extra support.  Patient said that she would because the situation with her son was ongoing.  Patient stated that her son has mental health issues as well as MS.  Patient sated that as long as he takes his medications accordingly things go ok.  Patient indicated that between her own health issues and her sons issues life became too much sometimes. Counselor provided support and recommended that patient make an appointment when she needed too.  Rolena Infante, MA, LPC [facility]

## 2016-05-13 LAB — T-HELPER CELL (CD4) - (RCID CLINIC ONLY)
CD4 % Helper T Cell: 37 % (ref 33–55)
CD4 T Cell Abs: 950 /uL (ref 400–2700)

## 2016-05-14 ENCOUNTER — Telehealth: Payer: Self-pay | Admitting: Infectious Disease

## 2016-05-14 LAB — HIV-1 RNA ULTRAQUANT REFLEX TO GENTYP+
HIV 1 RNA Quant: 20 copies/mL (ref <20)
HIV-1 RNA Quant, Log: 1.3 Log copies/mL (ref <1.30)

## 2016-05-14 NOTE — Telephone Encounter (Signed)
Ellen Lane fortunately Ellen Lane has stayed undetectable despite her not taking her [place] with food.  Do you want to check with and change her back to Atripla which she was on before so she doesn't have to worry about the food part?

## 2016-05-17 ENCOUNTER — Encounter: Payer: Self-pay | Admitting: Pharmacist

## 2016-05-17 NOTE — Telephone Encounter (Signed)
She's coming to see me next Tuesday. Can I wait to discuss with her then or should I change her back now?

## 2016-05-17 NOTE — Telephone Encounter (Signed)
Fine to discuss next Thursday thanks Cassie

## 2016-05-25 ENCOUNTER — Ambulatory Visit: Payer: Medicare HMO

## 2016-05-28 ENCOUNTER — Ambulatory Visit (INDEPENDENT_AMBULATORY_CARE_PROVIDER_SITE_OTHER): Payer: Medicare HMO | Admitting: Pharmacist Clinician (PhC)/ Clinical Pharmacy Specialist

## 2016-05-28 DIAGNOSIS — B20 Human immunodeficiency virus [HIV] disease: Secondary | ICD-10-CM

## 2016-05-28 MED ORDER — EMTRICITABINE-TENOFOVIR AF 200-25 MG PO TABS: 1.0000 | ORAL_TABLET | Freq: Every day | ORAL | 3 refills | Status: DC

## 2016-05-28 MED ORDER — ONDANSETRON HCL 4 MG PO TABS: 4.0000 mg | ORAL_TABLET | Freq: Three times a day (TID) | ORAL | 0 refills | Status: DC | PRN

## 2016-05-28 MED ORDER — EMTRICITAB-RILPIVIR-TENOFOV AF 200-25-25 MG PO TABS: 1.0000 | ORAL_TABLET | Freq: Every day | ORAL | 3 refills | Status: DC

## 2016-05-28 MED ORDER — DOLUTEGRAVIR SODIUM 50 MG PO TABS: 50.0000 mg | ORAL_TABLET | Freq: Every day | ORAL | 3 refills | Status: DC
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
## 2016-06-07 MED FILL — ODEFSEY 200-25-25 MG TABS: 200-25-25 | 30 days supply | Qty: 30 | Fill #0

## 2016-06-10 ENCOUNTER — Other Ambulatory Visit: Payer: Self-pay | Admitting: Pharmacist

## 2016-06-23 ENCOUNTER — Ambulatory Visit (INDEPENDENT_AMBULATORY_CARE_PROVIDER_SITE_OTHER): Payer: Medicare HMO | Admitting: Infectious Disease

## 2016-06-23 VITALS — BP 124/89 | HR 84 | Temp 98.4°F

## 2016-06-23 DIAGNOSIS — B20 Human immunodeficiency virus [HIV] disease: Secondary | ICD-10-CM

## 2016-06-23 DIAGNOSIS — G8220 Paraplegia, unspecified: Secondary | ICD-10-CM

## 2016-06-23 DIAGNOSIS — B9681 Helicobacter pylori [H. pylori] as the cause of diseases classified elsewhere: Secondary | ICD-10-CM

## 2016-06-23 DIAGNOSIS — E1059 Type 1 diabetes mellitus with other circulatory complications: Secondary | ICD-10-CM | POA: Diagnosis not present

## 2016-06-23 DIAGNOSIS — Z23 Encounter for immunization: Secondary | ICD-10-CM | POA: Diagnosis not present

## 2016-06-23 DIAGNOSIS — I10 Essential (primary) hypertension: Secondary | ICD-10-CM

## 2016-06-23 DIAGNOSIS — A048 Other specified bacterial intestinal infections: Secondary | ICD-10-CM

## 2016-06-23 DIAGNOSIS — Z21 Asymptomatic human immunodeficiency virus [HIV] infection status: Secondary | ICD-10-CM
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
## 2016-06-23 NOTE — Patient Instructions (Signed)
Talk to your PCP re reduced dose of Metformin to accomodate medicine Tivicay [facility]) which will INCREASe the metformin levels when you are on it  If you go to this regimen Tivicay and Descovy we will be able to stop worrying about food  Or acid

## 2016-06-25 DIAGNOSIS — Z23 Encounter for immunization: Secondary | ICD-10-CM | POA: Diagnosis not present

## 2016-07-06 ENCOUNTER — Other Ambulatory Visit: Payer: Self-pay | Admitting: Infectious Disease

## 2016-07-06 ENCOUNTER — Telehealth: Payer: Self-pay | Admitting: *Deleted

## 2016-07-06 MED ORDER — DOLUTEGRAVIR SODIUM 50 MG PO TABS: 50.0000 mg | ORAL_TABLET | Freq: Every day | ORAL | 11 refills | Status: DC

## 2016-07-06 MED ORDER — EMTRICITABINE-TENOFOVIR AF 200-25 MG PO TABS: 1.0000 | ORAL_TABLET | Freq: Every day | ORAL | 11 refills | Status: DC

## 2016-07-06 MED FILL — ODEFSEY 200-25-25 MG TABS: 200-25-25 | 30 days supply | Qty: 30 | Fill #1

## 2016-07-06 NOTE — Telephone Encounter (Signed)
I made the changes. She should see pharamcy in 2 weeks and have labs checked in 4

## 2016-07-06 NOTE — Progress Notes (Signed)
Written scripts for new regimen. She should meet with pharmacy in next 2 weeks after starting new regimen to see how she is tolerating this and then have labs in another month

## 2016-07-06 NOTE — Telephone Encounter (Signed)
Patient's PCP has changed her metformin to 500 mg twice daily and she would like to proceed with the new HIV mediation that Dr. Tommy Medal talked about. She uses [facility].

## 2016-07-07 ENCOUNTER — Other Ambulatory Visit: Payer: Self-pay | Admitting: *Deleted

## 2016-07-07 MED ORDER — DOLUTEGRAVIR SODIUM 50 MG PO TABS: 50.0000 mg | ORAL_TABLET | Freq: Every day | ORAL | 11 refills | Status: DC

## 2016-07-07 MED ORDER — EMTRICITABINE-TENOFOVIR AF 200-25 MG PO TABS: 1.0000 | ORAL_TABLET | Freq: Every day | ORAL | 11 refills | Status: DC

## 2016-07-07 NOTE — Telephone Encounter (Signed)
Patient notified

## 2016-07-07 NOTE — Telephone Encounter (Signed)
Excellent

## 2016-07-13 ENCOUNTER — Other Ambulatory Visit: Payer: Self-pay | Admitting: Pharmacist

## 2016-07-13 ENCOUNTER — Telehealth: Payer: Self-pay | Admitting: Pharmacist

## 2016-07-13 MED FILL — TIVICAY 50 MG TABLET: 50 | 30 days supply | Qty: 30 | Fill #0

## 2016-07-13 MED FILL — DESCOVY 200-25 MG TABS: 200-25 | 30 days supply | Qty: 30 | Fill #0

## 2016-07-13 NOTE — Telephone Encounter (Signed)
Called Ellen Lane to let her know that [facility] will be mailing her new medications, the [place], tomorrow and that she will receive the new medications on Thursday.  She knows to stop the [place] once she receives the [place].  She agrees to the plan.

## 2016-07-13 NOTE — Telephone Encounter (Signed)
Excellent

## 2016-08-04 ENCOUNTER — Other Ambulatory Visit: Payer: Self-pay | Admitting: Infectious Disease

## 2016-08-05 ENCOUNTER — Other Ambulatory Visit: Payer: Self-pay | Admitting: Pharmacist

## 2016-08-05 MED FILL — TIVICAY 50 MG TABLET: 50 | 30 days supply | Qty: 30 | Fill #1

## 2016-08-05 MED FILL — DESCOVY 200-25 MG TABS: 200-25 | 30 days supply | Qty: 30 | Fill #1

## 2016-08-10 ENCOUNTER — Other Ambulatory Visit: Payer: Medicare HMO

## 2016-08-10 ENCOUNTER — Ambulatory Visit: Payer: Medicare HMO

## 2016-08-11 ENCOUNTER — Other Ambulatory Visit: Payer: Self-pay | Admitting: Family Medicine

## 2016-08-11 DIAGNOSIS — Z1231 Encounter for screening mammogram for malignant neoplasm of breast: Secondary | ICD-10-CM

## 2016-08-23 ENCOUNTER — Ambulatory Visit: Payer: Medicare HMO | Admitting: Infectious Disease

## 2016-08-30 ENCOUNTER — Ambulatory Visit
Admission: RE | Admit: 2016-08-30 | Discharge: 2016-08-30 | Disposition: A | Discharge status: Home / Self Care | Payer: Medicare HMO | Source: Ambulatory Visit | Attending: Family Medicine | Admitting: Family Medicine

## 2016-08-30 DIAGNOSIS — Z1231 Encounter for screening mammogram for malignant neoplasm of breast: Secondary | ICD-10-CM

## 2016-09-02 MED FILL — TIVICAY 50 MG TABLET: 50 | 30 days supply | Qty: 30 | Fill #2

## 2016-09-02 MED FILL — DESCOVY 200-25 MG TABS: 200-25 | 30 days supply | Qty: 30 | Fill #2

## 2016-09-13 ENCOUNTER — Other Ambulatory Visit: Payer: Medicare HMO

## 2016-09-16 ENCOUNTER — Other Ambulatory Visit: Payer: Medicare HMO

## 2016-09-16 DIAGNOSIS — B20 Human immunodeficiency virus [HIV] disease: Secondary | ICD-10-CM

## 2016-09-16 DIAGNOSIS — Z113 Encounter for screening for infections with a predominantly sexual mode of transmission: Secondary | ICD-10-CM

## 2016-09-16 DIAGNOSIS — E785 Hyperlipidemia, unspecified: Secondary | ICD-10-CM

## 2016-09-16 DIAGNOSIS — E1110 Type 2 diabetes mellitus with ketoacidosis without coma: Secondary | ICD-10-CM

## 2016-09-16 DIAGNOSIS — N390 Urinary tract infection, site not specified: Secondary | ICD-10-CM

## 2016-09-16 DIAGNOSIS — N10 Acute tubulo-interstitial nephritis: Secondary | ICD-10-CM

## 2016-09-16 LAB — CBC WITH DIFFERENTIAL/PLATELET
BASOS ABS: 0 {cells}/uL (ref 0–200)
Basophils Relative: 0 %
Eosinophils Absolute: 284 cells/uL (ref 15–500)
Eosinophils Relative: 4 %
HEMATOCRIT: 43.7 % (ref 35.0–45.0)
HEMOGLOBIN: 13.8 g/dL (ref 11.7–15.5)
LYMPHS ABS: 2627 {cells}/uL (ref 850–3900)
Lymphocytes Relative: 37 %
MCH: 26.4 pg — AB (ref 27.0–33.0)
MCHC: 31.6 g/dL — AB (ref 32.0–36.0)
MCV: 83.7 fL (ref 80.0–100.0)
MONO ABS: 497 {cells}/uL (ref 200–950)
MPV: 11.4 fL (ref 7.5–12.5)
Monocytes Relative: 7 %
NEUTROS PCT: 52 %
Neutro Abs: 3692 cells/uL (ref 1500–7800)
Platelets: 207 10*3/uL (ref 140–400)
RBC: 5.22 MIL/uL — ABNORMAL HIGH (ref 3.80–5.10)
RDW: 15 % (ref 11.0–15.0)
WBC: 7.1 10*3/uL (ref 3.8–10.8)

## 2016-09-16 LAB — COMPLETE METABOLIC PANEL WITH GFR
ALBUMIN: 3.8 g/dL (ref 3.6–5.1)
ALK PHOS: 87 U/L (ref 33–115)
ALT: 18 U/L (ref 6–29)
AST: 14 U/L (ref 10–35)
BUN: 16 mg/dL (ref 7–25)
CALCIUM: 9.5 mg/dL (ref 8.6–10.2)
CO2: 23 mmol/L (ref 20–31)
Chloride: 104 mmol/L (ref 98–110)
Creat: 0.75 mg/dL (ref 0.50–1.10)
Glucose, Bld: 194 mg/dL — ABNORMAL HIGH (ref 65–99)
POTASSIUM: 4.8 mmol/L (ref 3.5–5.3)
Sodium: 137 mmol/L (ref 135–146)
Total Bilirubin: 0.3 mg/dL (ref 0.2–1.2)
Total Protein: 7 g/dL (ref 6.1–8.1)

## 2016-09-16 LAB — LIPID PANEL
CHOL/HDL RATIO: 3.8 ratio (ref <5.0)
Cholesterol: 161 mg/dL (ref <200)
HDL: 42 mg/dL — ABNORMAL LOW (ref >50)
LDL Cholesterol: 86 mg/dL (ref <100)
Triglycerides: 165 mg/dL — ABNORMAL HIGH (ref <150)
VLDL: 33 mg/dL — ABNORMAL HIGH (ref <30)

## 2016-09-17 LAB — T-HELPER CELL (CD4) - (RCID CLINIC ONLY)
CD4 % Helper T Cell: 34 % (ref 33–55)
CD4 T CELL ABS: 940 /uL (ref 400–2700)

## 2016-09-17 LAB — RPR

## 2016-09-20 ENCOUNTER — Telehealth: Payer: Self-pay | Admitting: Infectious Disease

## 2016-09-20 DIAGNOSIS — B20 Human immunodeficiency virus [HIV] disease: Secondary | ICD-10-CM

## 2016-09-20 LAB — HIV-1 RNA QUANT-NO REFLEX-BLD: HIV 1 RNA Quant: <20 copies/mL (ref <20)

## 2016-09-20 NOTE — Telephone Encounter (Signed)
Can we add a genotype to her labs that were drawn recently put an order in the computer for

## 2016-09-27 ENCOUNTER — Ambulatory Visit: Payer: Medicare HMO | Admitting: Infectious Disease

## 2016-10-01 MED FILL — DESCOVY 200-25 MG TABS: 200-25 | 30 days supply | Qty: 30 | Fill #3

## 2016-10-01 MED FILL — TIVICAY 50 MG TABLET: 50 | 30 days supply | Qty: 30 | Fill #3

## 2016-10-20 ENCOUNTER — Ambulatory Visit: Payer: Medicare HMO | Admitting: Infectious Disease

## 2016-10-28 ENCOUNTER — Encounter: Payer: Self-pay | Admitting: Infectious Disease

## 2016-10-28 ENCOUNTER — Ambulatory Visit (INDEPENDENT_AMBULATORY_CARE_PROVIDER_SITE_OTHER): Payer: Medicare HMO | Admitting: Infectious Disease

## 2016-10-28 VITALS — BP 132/82 | HR 83 | Temp 98.4°F | Ht 62.0 in

## 2016-10-28 DIAGNOSIS — Z23 Encounter for immunization: Secondary | ICD-10-CM

## 2016-10-28 DIAGNOSIS — E1100 Type 2 diabetes mellitus with hyperosmolarity without nonketotic hyperglycemic-hyperosmolar coma (NKHHC): Secondary | ICD-10-CM

## 2016-10-28 DIAGNOSIS — E782 Mixed hyperlipidemia: Secondary | ICD-10-CM

## 2016-10-28 DIAGNOSIS — Z79899 Other long term (current) drug therapy: Secondary | ICD-10-CM

## 2016-10-28 DIAGNOSIS — G8220 Paraplegia, unspecified: Secondary | ICD-10-CM

## 2016-10-28 DIAGNOSIS — B20 Human immunodeficiency virus [HIV] disease: Secondary | ICD-10-CM | POA: Diagnosis not present

## 2016-10-28 DIAGNOSIS — Z21 Asymptomatic human immunodeficiency virus [HIV] infection status: Secondary | ICD-10-CM

## 2016-10-28 DIAGNOSIS — Z113 Encounter for screening for infections with a predominantly sexual mode of transmission: Secondary | ICD-10-CM

## 2016-10-28 HISTORY — DX: Other long term (current) drug therapy: Z79.899

## 2016-10-28 HISTORY — DX: Encounter for screening for infections with a predominantly sexual mode of transmission: Z11.3
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
## 2016-10-29 MED FILL — TIVICAY 50 MG TABLET: 50 | 30 days supply | Qty: 30 | Fill #4 | Status: TO

## 2016-10-29 MED FILL — DESCOVY 200-25 MG TABS: 200-25 | 30 days supply | Qty: 30 | Fill #4 | Status: TO

## 2016-11-26 MED FILL — TIVICAY 50 MG TABLET: 50 | 30 days supply | Qty: 30 | Fill #0

## 2016-11-26 MED FILL — DESCOVY 200-25 MG TABS: 200-25 | 30 days supply | Qty: 30 | Fill #0

## 2016-12-23 MED FILL — TIVICAY 50 MG TABLET: 50 | 30 days supply | Qty: 30 | Fill #1

## 2016-12-23 MED FILL — DESCOVY 200-25 MG TABS: 200-25 | 30 days supply | Qty: 30 | Fill #1

## 2016-12-27 ENCOUNTER — Ambulatory Visit: Payer: Medicare HMO

## 2016-12-29 ENCOUNTER — Ambulatory Visit: Payer: Medicare HMO | Admitting: *Deleted

## 2016-12-29 DIAGNOSIS — Z23 Encounter for immunization: Secondary | ICD-10-CM

## 2016-12-29 MED ORDER — MENINGOCOCCAL A C Y&W-135 OLIG IM SOLR
0.5000 mL | Freq: Once | INTRAMUSCULAR | Status: AC
  Administered 2016-12-29: 0.5 mL via INTRAMUSCULAR

## 2017-01-26 ENCOUNTER — Ambulatory Visit: Payer: Medicare HMO | Admitting: Obstetrics & Gynecology

## 2017-01-26 ENCOUNTER — Telehealth: Payer: Self-pay | Admitting: *Deleted

## 2017-01-26 ENCOUNTER — Encounter: Payer: Self-pay | Admitting: Obstetrics & Gynecology

## 2017-01-26 VITALS — BP 112/69 | HR 84

## 2017-01-26 MED FILL — TIVICAY 50 MG TABLET: 50 | 30 days supply | Qty: 30 | Fill #2

## 2017-01-26 MED FILL — DESCOVY 200-25 MG TABS: 200-25 | 30 days supply | Qty: 30 | Fill #2

## 2017-01-26 NOTE — Telephone Encounter (Signed)
Per Dr. Harolyn Rutherford pt is due for pap next year but may still come to todays appointment if she would like to. Attempted to call patient and it went straight to voicemail, left message to call us back prior to appointment and ask to speak with me.

## 2017-01-27 NOTE — Progress Notes (Signed)
Appointment rescheduled due to lack of lift services.  Osborne Oman, MD

## 2017-02-22 MED FILL — TIVICAY 50 MG TABLET: 50 | 30 days supply | Qty: 30 | Fill #3

## 2017-02-22 MED FILL — DESCOVY 200-25 MG TABS: 200-25 | 30 days supply | Qty: 30 | Fill #3

## 2017-02-23 ENCOUNTER — Ambulatory Visit: Payer: Medicare HMO | Attending: Nurse Practitioner | Admitting: Physical Therapy

## 2017-02-23 DIAGNOSIS — G8221 Paraplegia, complete: Secondary | ICD-10-CM | POA: Insufficient documentation
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
## 2017-03-22 MED FILL — DESCOVY 200-25 MG TABS: 200-25 | 30 days supply | Qty: 30 | Fill #4

## 2017-03-22 MED FILL — TIVICAY 50 MG TABLET: 50 | 30 days supply | Qty: 30 | Fill #4

## 2017-04-25 ENCOUNTER — Encounter (HOSPITAL_COMMUNITY): Payer: Self-pay | Admitting: Emergency Medicine

## 2017-04-25 ENCOUNTER — Emergency Department (HOSPITAL_COMMUNITY)
Admission: EM | Admit: 2017-04-25 | Discharge: 2017-04-26 | Disposition: A | Discharge status: Home / Self Care | Payer: Medicare HMO | Attending: Emergency Medicine | Admitting: Emergency Medicine

## 2017-04-25 DIAGNOSIS — Z79899 Other long term (current) drug therapy: Secondary | ICD-10-CM | POA: Insufficient documentation

## 2017-04-25 DIAGNOSIS — I10 Essential (primary) hypertension: Secondary | ICD-10-CM | POA: Insufficient documentation

## 2017-04-25 DIAGNOSIS — Z7984 Long term (current) use of oral hypoglycemic drugs: Secondary | ICD-10-CM | POA: Diagnosis not present

## 2017-04-25 DIAGNOSIS — E119 Type 2 diabetes mellitus without complications: Secondary | ICD-10-CM | POA: Diagnosis not present

## 2017-04-25 DIAGNOSIS — R1013 Epigastric pain: Secondary | ICD-10-CM | POA: Diagnosis not present

## 2017-04-25 DIAGNOSIS — Z9884 Bariatric surgery status: Secondary | ICD-10-CM | POA: Insufficient documentation

## 2017-04-25 MED ORDER — ONDANSETRON 4 MG PO TBDP
4.0000 mg | ORAL_TABLET | Freq: Once | ORAL | Status: DC | PRN
Start: 2017-04-25 — End: 2017-04-26

## 2017-04-25 MED FILL — DESCOVY 200-25 MG TABS: 200-25 | 30 days supply | Qty: 30 | Fill #5

## 2017-04-25 MED FILL — TIVICAY 50 MG TABLET: 50 | 30 days supply | Qty: 30 | Fill #5
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
## 2017-04-25 NOTE — ED Triage Notes (Signed)
Pt from home with complaints of upper abdominal pain and persistent emesis following gastric bypass surgery 2 weeks ago. Pt is actively vomiting during assessment. Pt states she has had chills, but denies fever at home. Pt states her surgical site is healing well. Pt states her next follow up with her surgeon is on the 19th. Pt states pain began this morning.

## 2017-04-26 ENCOUNTER — Emergency Department (HOSPITAL_COMMUNITY): Payer: Medicare HMO

## 2017-04-26 ENCOUNTER — Encounter (HOSPITAL_COMMUNITY): Payer: Self-pay

## 2017-04-26 DIAGNOSIS — R1013 Epigastric pain: Secondary | ICD-10-CM | POA: Diagnosis not present

## 2017-04-26 LAB — COMPREHENSIVE METABOLIC PANEL
ALK PHOS: 103 U/L (ref 38–126)
ALT: 18 U/L (ref 14–54)
ANION GAP: 11 (ref 5–15)
AST: 22 U/L (ref 15–41)
Albumin: 4.1 g/dL (ref 3.5–5.0)
BILIRUBIN TOTAL: 0.9 mg/dL (ref 0.3–1.2)
BUN: 7 mg/dL (ref 6–20)
CALCIUM: 9.4 mg/dL (ref 8.9–10.3)
CO2: 22 mmol/L (ref 22–32)
CREATININE: 0.72 mg/dL (ref 0.44–1.00)
Chloride: 110 mmol/L (ref 101–111)
Glucose, Bld: 171 mg/dL — ABNORMAL HIGH (ref 65–99)
Potassium: 4.3 mmol/L (ref 3.5–5.1)
SODIUM: 143 mmol/L (ref 135–145)
TOTAL PROTEIN: 7.7 g/dL (ref 6.5–8.1)

## 2017-04-26 LAB — LACTIC ACID, PLASMA: LACTIC ACID, VENOUS: 1.7 mmol/L (ref 0.5–1.9)

## 2017-04-26 LAB — CBC
HCT: 38 % (ref 36.0–46.0)
HEMOGLOBIN: 12.9 g/dL (ref 12.0–15.0)
MCH: 27.6 pg (ref 26.0–34.0)
MCHC: 33.9 g/dL (ref 30.0–36.0)
MCV: 81.4 fL (ref 78.0–100.0)
PLATELETS: 283 10*3/uL (ref 150–400)
RBC: 4.67 MIL/uL (ref 3.87–5.11)
RDW: 14.9 % (ref 11.5–15.5)
WBC: 13.2 10*3/uL — AB (ref 4.0–10.5)

## 2017-04-26 LAB — LIPASE, BLOOD: Lipase: 25 U/L (ref 11–51)

## 2017-04-26 MED ORDER — FENTANYL CITRATE (PF) 100 MCG/2ML IJ SOLN
100.0000 ug | Freq: Once | INTRAMUSCULAR | Status: AC
  Administered 2017-04-26: 100 ug via INTRAVENOUS
  Filled 2017-04-26: qty 2

## 2017-04-26 MED ORDER — PANTOPRAZOLE SODIUM 40 MG IV SOLR
40.0000 mg | Freq: Once | INTRAVENOUS | Status: AC
  Administered 2017-04-26: 40 mg via INTRAVENOUS
  Filled 2017-04-26: qty 40

## 2017-04-26 MED ORDER — OMEPRAZOLE 20 MG PO CPDR: 20.0000 mg | DELAYED_RELEASE_CAPSULE | Freq: Two times a day (BID) | ORAL | 0 refills | Status: DC

## 2017-04-26 MED ORDER — IOPAMIDOL (ISOVUE-300) INJECTION 61%
INTRAVENOUS | Status: AC
  Administered 2017-04-26: 15 mL via ORAL
  Filled 2017-04-26: qty 30

## 2017-04-26 MED ORDER — IOPAMIDOL (ISOVUE-300) INJECTION 61%
15.0000 mL | Freq: Once | INTRAVENOUS | Status: AC | PRN
  Administered 2017-04-26 (×2): 15 mL via ORAL

## 2017-04-26 MED ORDER — SODIUM CHLORIDE 0.9 % IV SOLN
Freq: Once | INTRAVENOUS | Status: AC
  Administered 2017-04-26: via INTRAVENOUS

## 2017-04-26 MED ORDER — ONDANSETRON HCL 4 MG/2ML IJ SOLN
4.0000 mg | Freq: Once | INTRAMUSCULAR | Status: AC
  Administered 2017-04-26: 4 mg via INTRAVENOUS
  Filled 2017-04-26: qty 2

## 2017-04-26 MED ORDER — IOPAMIDOL (ISOVUE-300) INJECTION 61%
100.0000 mL | Freq: Once | INTRAVENOUS | Status: AC | PRN
  Administered 2017-04-26: 100 mL via INTRAVENOUS

## 2017-04-26 MED ORDER — IOPAMIDOL (ISOVUE-300) INJECTION 61%
INTRAVENOUS | Status: AC
  Administered 2017-04-26: 100 mL via INTRAVENOUS
  Filled 2017-04-26: qty 100

## 2017-04-26 NOTE — ED Notes (Signed)
Informed pt and her husband that pt needed to wait for CD-rom of CT scan. Pts husband refused to wait and left with pt in wheelchair.

## 2017-04-26 NOTE — ED Notes (Signed)
Patient transported to CT 

## 2017-04-26 NOTE — Discharge Instructions (Signed)
Contact your surgeon and let him know your CT scan in the ED show reflux and thickening of the bypassed part of the stomach. Tell him you were started on omeprazole.

## 2017-05-23 MED FILL — DESCOVY 200-25 MG TABS: 200-25 | 30 days supply | Qty: 30 | Fill #6

## 2017-05-23 MED FILL — TIVICAY 50 MG TABLET: 50 | 30 days supply | Qty: 30 | Fill #6

## 2017-06-09 ENCOUNTER — Other Ambulatory Visit: Payer: Self-pay | Admitting: Pharmacist

## 2017-06-14 ENCOUNTER — Other Ambulatory Visit: Payer: Self-pay | Admitting: Infectious Disease

## 2017-06-15 MED FILL — DESCOVY 200-25 MG TABS: 200-25 | 30 days supply | Qty: 30 | Fill #0

## 2017-06-15 MED FILL — TIVICAY 50 MG TABLET: 50 | 30 days supply | Qty: 30 | Fill #0

## 2017-06-28 ENCOUNTER — Other Ambulatory Visit: Payer: Medicare HMO

## 2017-06-28 DIAGNOSIS — B20 Human immunodeficiency virus [HIV] disease: Secondary | ICD-10-CM

## 2017-06-28 DIAGNOSIS — N10 Acute tubulo-interstitial nephritis: Secondary | ICD-10-CM

## 2017-06-28 DIAGNOSIS — Z113 Encounter for screening for infections with a predominantly sexual mode of transmission: Secondary | ICD-10-CM

## 2017-06-28 DIAGNOSIS — N390 Urinary tract infection, site not specified: Secondary | ICD-10-CM

## 2017-06-28 DIAGNOSIS — E785 Hyperlipidemia, unspecified: Secondary | ICD-10-CM

## 2017-06-28 DIAGNOSIS — E1110 Type 2 diabetes mellitus with ketoacidosis without coma: Secondary | ICD-10-CM

## 2017-06-28 DIAGNOSIS — E782 Mixed hyperlipidemia: Secondary | ICD-10-CM

## 2017-06-29 LAB — COMPLETE METABOLIC PANEL WITH GFR
AG Ratio: 1.5 (calc) (ref 1.0–2.5)
ALBUMIN MSPROF: 3.9 g/dL (ref 3.6–5.1)
ALKALINE PHOSPHATASE (APISO): 150 U/L — AB (ref 33–115)
ALT: 20 U/L (ref 6–29)
AST: 16 U/L (ref 10–35)
BUN: 12 mg/dL (ref 7–25)
CO2: 22 mmol/L (ref 20–32)
CREATININE: 0.62 mg/dL (ref 0.50–1.10)
Calcium: 9.1 mg/dL (ref 8.6–10.2)
Chloride: 109 mmol/L (ref 98–110)
GFR, EST AFRICAN AMERICAN: 123 mL/min/{1.73_m2} (ref >=60)
GFR, EST NON AFRICAN AMERICAN: 106 mL/min/{1.73_m2} (ref >=60)
GLOBULIN: 2.6 g/dL (ref 1.9–3.7)
GLUCOSE: 191 mg/dL — AB (ref 65–99)
Potassium: 3.8 mmol/L (ref 3.5–5.3)
SODIUM: 141 mmol/L (ref 135–146)
TOTAL PROTEIN: 6.5 g/dL (ref 6.1–8.1)
Total Bilirubin: 0.3 mg/dL (ref 0.2–1.2)

## 2017-06-29 LAB — CBC WITH DIFFERENTIAL/PLATELET
BASOS ABS: 21 {cells}/uL (ref 0–200)
Basophils Relative: 0.4 %
EOS ABS: 154 {cells}/uL (ref 15–500)
Eosinophils Relative: 2.9 %
HEMATOCRIT: 39.9 % (ref 35.0–45.0)
Hemoglobin: 13 g/dL (ref 11.7–15.5)
LYMPHS ABS: 2120 {cells}/uL (ref 850–3900)
MCH: 27 pg (ref 27.0–33.0)
MCHC: 32.6 g/dL (ref 32.0–36.0)
MCV: 82.8 fL (ref 80.0–100.0)
MPV: 12.2 fL (ref 7.5–12.5)
Monocytes Relative: 8.6 %
NEUTROS PCT: 48.1 %
Neutro Abs: 2549 cells/uL (ref 1500–7800)
PLATELETS: 172 10*3/uL (ref 140–400)
RBC: 4.82 10*6/uL (ref 3.80–5.10)
RDW: 13.6 % (ref 11.0–15.0)
TOTAL LYMPHOCYTE: 40 %
WBC: 5.3 10*3/uL (ref 3.8–10.8)
WBCMIX: 456 {cells}/uL (ref 200–950)

## 2017-06-29 LAB — T-HELPER CELLS (CD4) COUNT (NOT AT ARMC)
CD4 % Helper T Cell: 37 % (ref 33–55)
CD4 T Cell Abs: 810 /uL (ref 400–2700)

## 2017-06-29 LAB — RPR: RPR: NONREACTIVE

## 2017-07-01 LAB — HIV-1 RNA ULTRAQUANT REFLEX TO GENTYP+: HIV 1 RNA Quant: <20 Copies/mL

## 2017-07-06 ENCOUNTER — Ambulatory Visit: Payer: Medicare HMO | Admitting: Infectious Disease

## 2017-07-11 ENCOUNTER — Ambulatory Visit: Payer: Medicare HMO | Admitting: Infectious Disease

## 2017-07-13 ENCOUNTER — Encounter: Payer: Self-pay | Admitting: Infectious Disease

## 2017-07-13 ENCOUNTER — Ambulatory Visit (INDEPENDENT_AMBULATORY_CARE_PROVIDER_SITE_OTHER): Payer: Medicare HMO | Admitting: Infectious Disease

## 2017-07-13 ENCOUNTER — Ambulatory Visit: Payer: Medicare HMO | Admitting: Infectious Disease

## 2017-07-13 VITALS — BP 127/77 | HR 77 | Temp 97.9°F

## 2017-07-13 DIAGNOSIS — E1059 Type 1 diabetes mellitus with other circulatory complications: Secondary | ICD-10-CM | POA: Diagnosis not present

## 2017-07-13 DIAGNOSIS — Z23 Encounter for immunization: Secondary | ICD-10-CM | POA: Diagnosis not present

## 2017-07-13 DIAGNOSIS — B20 Human immunodeficiency virus [HIV] disease: Secondary | ICD-10-CM | POA: Diagnosis not present

## 2017-07-13 DIAGNOSIS — G8220 Paraplegia, unspecified: Secondary | ICD-10-CM | POA: Diagnosis not present

## 2017-07-13 MED ORDER — BICTEGRAVIR-EMTRICITAB-TENOFOV 50-200-25 MG PO TABS: 1.0000 | ORAL_TABLET | Freq: Every day | ORAL | 11 refills | Status: DC

## 2017-07-13 MED FILL — BIKTARVY 50-200-25 MG TABS: 50-200-25 | 30 days supply | Qty: 30 | Fill #0
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
## 2017-08-03 ENCOUNTER — Other Ambulatory Visit: Payer: Self-pay | Admitting: Family Medicine

## 2017-08-03 DIAGNOSIS — Z1231 Encounter for screening mammogram for malignant neoplasm of breast: Secondary | ICD-10-CM

## 2017-08-08 MED FILL — BIKTARVY 50-200-25 MG TABS: 50-200-25 | 30 days supply | Qty: 30 | Fill #1

## 2017-08-17 ENCOUNTER — Ambulatory Visit: Payer: Medicare HMO | Admitting: Infectious Disease

## 2017-09-01 ENCOUNTER — Ambulatory Visit: Payer: Medicare HMO

## 2017-09-09 MED FILL — BIKTARVY 50-200-25 MG TABS: 50-200-25 | 30 days supply | Qty: 30 | Fill #2

## 2017-09-30 ENCOUNTER — Ambulatory Visit: Payer: Medicaid Other

## 2017-10-05 ENCOUNTER — Other Ambulatory Visit: Payer: Self-pay | Admitting: Pharmacist

## 2017-10-05 MED FILL — BIKTARVY 50-200-25 MG TABS: 50-200-25 | 30 days supply | Qty: 30 | Fill #3

## 2017-10-19 ENCOUNTER — Ambulatory Visit: Payer: Medicaid Other | Admitting: Infectious Disease

## 2017-10-28 MED FILL — BIKTARVY 50-200-25 MG TABS: 50-200-25 | 30 days supply | Qty: 30 | Fill #4

## 2017-10-31 ENCOUNTER — Ambulatory Visit: Payer: Medicaid Other

## 2017-11-01 ENCOUNTER — Ambulatory Visit: Payer: Medicaid Other

## 2017-11-09 ENCOUNTER — Ambulatory Visit: Payer: Medicaid Other | Admitting: Infectious Disease

## 2017-11-21 ENCOUNTER — Ambulatory Visit: Payer: Medicaid Other

## 2017-11-21 MED FILL — BIKTARVY 50-200-25 MG TABS: 50-200-25 | 30 days supply | Qty: 30 | Fill #5

## 2017-12-07 ENCOUNTER — Ambulatory Visit: Payer: Medicaid Other

## 2017-12-12 ENCOUNTER — Encounter: Payer: Self-pay | Admitting: Infectious Disease

## 2017-12-12 ENCOUNTER — Ambulatory Visit (INDEPENDENT_AMBULATORY_CARE_PROVIDER_SITE_OTHER): Payer: Medicare Other | Admitting: Infectious Disease

## 2017-12-12 VITALS — BP 126/80 | HR 66 | Temp 97.6°F

## 2017-12-12 DIAGNOSIS — B20 Human immunodeficiency virus [HIV] disease: Secondary | ICD-10-CM | POA: Diagnosis not present

## 2017-12-12 DIAGNOSIS — E782 Mixed hyperlipidemia: Secondary | ICD-10-CM | POA: Diagnosis not present

## 2017-12-12 DIAGNOSIS — G8220 Paraplegia, unspecified: Secondary | ICD-10-CM | POA: Diagnosis not present

## 2017-12-12 DIAGNOSIS — I10 Essential (primary) hypertension: Secondary | ICD-10-CM | POA: Diagnosis not present

## 2017-12-12 DIAGNOSIS — Z113 Encounter for screening for infections with a predominantly sexual mode of transmission: Secondary | ICD-10-CM

## 2017-12-12 MED ORDER — BICTEGRAVIR-EMTRICITAB-TENOFOV 50-200-25 MG PO TABS: 1.0000 | ORAL_TABLET | Freq: Every day | ORAL | 11 refills | Status: DC
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
## 2017-12-12 NOTE — Addendum Note (Signed)
Addended by: Dolan Amen D on: 12/12/2017 10:57 AM   Modules accepted: Orders

## 2017-12-13 LAB — CBC WITH DIFFERENTIAL/PLATELET
BASOS PCT: 0.3 %
Basophils Absolute: 27 cells/uL (ref 0–200)
Eosinophils Absolute: 191 cells/uL (ref 15–500)
Eosinophils Relative: 2.1 %
HCT: 38.5 % (ref 35.0–45.0)
Hemoglobin: 12.8 g/dL (ref 11.7–15.5)
LYMPHS ABS: 2311 {cells}/uL (ref 850–3900)
MCH: 27.6 pg (ref 27.0–33.0)
MCHC: 33.2 g/dL (ref 32.0–36.0)
MCV: 83 fL (ref 80.0–100.0)
MPV: 12.7 fL — AB (ref 7.5–12.5)
Monocytes Relative: 7 %
Neutro Abs: 5933 cells/uL (ref 1500–7800)
Neutrophils Relative %: 65.2 %
PLATELETS: 159 10*3/uL (ref 140–400)
RBC: 4.64 10*6/uL (ref 3.80–5.10)
RDW: 13.7 % (ref 11.0–15.0)
TOTAL LYMPHOCYTE: 25.4 %
WBC: 9.1 10*3/uL (ref 3.8–10.8)
WBCMIX: 637 {cells}/uL (ref 200–950)

## 2017-12-13 LAB — COMPLETE METABOLIC PANEL WITH GFR
AG Ratio: 1.3 (calc) (ref 1.0–2.5)
ALKALINE PHOSPHATASE (APISO): 144 U/L — AB (ref 33–130)
ALT: 18 U/L (ref 6–29)
AST: 14 U/L (ref 10–35)
Albumin: 3.9 g/dL (ref 3.6–5.1)
BUN: 10 mg/dL (ref 7–25)
CO2: 23 mmol/L (ref 20–32)
CREATININE: 0.6 mg/dL (ref 0.50–1.05)
Calcium: 9.3 mg/dL (ref 8.6–10.4)
Chloride: 108 mmol/L (ref 98–110)
GFR, Est African American: 123 mL/min/{1.73_m2} (ref >=60)
GFR, Est Non African American: 106 mL/min/{1.73_m2} (ref >=60)
Globulin: 2.9 g/dL (calc) (ref 1.9–3.7)
Glucose, Bld: 147 mg/dL — ABNORMAL HIGH (ref 65–99)
Potassium: 4.3 mmol/L (ref 3.5–5.3)
SODIUM: 139 mmol/L (ref 135–146)
Total Bilirubin: 0.4 mg/dL (ref 0.2–1.2)
Total Protein: 6.8 g/dL (ref 6.1–8.1)

## 2017-12-13 LAB — LIPID PANEL
Cholesterol: 128 mg/dL (ref <200)
HDL: 40 mg/dL — ABNORMAL LOW (ref >50)
LDL CHOLESTEROL (CALC): 70 mg/dL
Non-HDL Cholesterol (Calc): 88 mg/dL (calc) (ref <130)
TRIGLYCERIDES: 95 mg/dL (ref <150)
Total CHOL/HDL Ratio: 3.2 (calc) (ref <5.0)

## 2017-12-13 LAB — T-HELPER CELL (CD4) - (RCID CLINIC ONLY)
CD4 T CELL ABS: 770 /uL (ref 400–2700)
CD4 T CELL HELPER: 34 % (ref 33–55)

## 2017-12-13 LAB — RPR: RPR: NONREACTIVE

## 2017-12-14 LAB — HIV-1 RNA QUANT-NO REFLEX-BLD
HIV 1 RNA QUANT: DETECTED {copies}/mL — AB
HIV-1 RNA Quant, Log: <1.3 Log copies/mL — AB

## 2017-12-16 MED FILL — BIKTARVY 50-200-25 MG TABS: 50-200-25 | 30 days supply | Qty: 30 | Fill #6

## 2017-12-27 ENCOUNTER — Ambulatory Visit
Admission: RE | Admit: 2017-12-27 | Discharge: 2017-12-27 | Disposition: A | Discharge status: Home / Self Care | Payer: Medicare Other | Source: Ambulatory Visit | Attending: Family Medicine | Admitting: Family Medicine

## 2017-12-27 DIAGNOSIS — Z1231 Encounter for screening mammogram for malignant neoplasm of breast: Secondary | ICD-10-CM

## 2017-12-29 ENCOUNTER — Other Ambulatory Visit: Payer: Self-pay | Admitting: Family Medicine

## 2017-12-29 DIAGNOSIS — R921 Mammographic calcification found on diagnostic imaging of breast: Secondary | ICD-10-CM

## 2017-12-30 ENCOUNTER — Other Ambulatory Visit: Payer: Self-pay | Admitting: Pharmacist

## 2018-01-02 ENCOUNTER — Ambulatory Visit
Admission: RE | Admit: 2018-01-02 | Discharge: 2018-01-02 | Disposition: A | Discharge status: Home / Self Care | Payer: Medicare Other | Source: Ambulatory Visit | Attending: Family Medicine | Admitting: Family Medicine

## 2018-01-02 DIAGNOSIS — R921 Mammographic calcification found on diagnostic imaging of breast: Secondary | ICD-10-CM

## 2018-05-01 ENCOUNTER — Other Ambulatory Visit (HOSPITAL_COMMUNITY)
Admission: RE | Admit: 2018-05-01 | Discharge: 2018-05-01 | Disposition: A | Discharge status: Home / Self Care | Payer: Medicare Other | Source: Ambulatory Visit | Attending: Obstetrics & Gynecology | Admitting: Obstetrics & Gynecology

## 2018-05-01 ENCOUNTER — Ambulatory Visit (INDEPENDENT_AMBULATORY_CARE_PROVIDER_SITE_OTHER): Payer: Medicare Other | Admitting: Obstetrics & Gynecology

## 2018-05-01 ENCOUNTER — Encounter: Payer: Self-pay | Admitting: Obstetrics & Gynecology

## 2018-05-01 VITALS — BP 115/74 | HR 68

## 2018-05-01 DIAGNOSIS — Z01419 Encounter for gynecological examination (general) (routine) without abnormal findings: Secondary | ICD-10-CM

## 2018-05-01 NOTE — Addendum Note (Signed)
Addended by: Bethanne Ginger on: 05/01/2018 10:08 AM   Modules accepted: Orders

## 2018-05-01 NOTE — Patient Instructions (Signed)
Return to clinic for any scheduled appointments or for any gynecologic concerns as needed.    Preventive Care 40-64 Years, Female Preventive care refers to lifestyle choices and visits with your health care provider that can promote health and wellness. What does preventive care include?  A yearly physical exam. This is also called an annual well check.  Dental exams once or twice a year.  Routine eye exams. Ask your health care provider how often you should have your eyes checked.  Personal lifestyle choices, including: ? Daily care of your teeth and gums. ? Regular physical activity. ? Eating a healthy diet. ? Avoiding tobacco and drug use. ? Limiting alcohol use. ? Practicing safe sex. ? Taking low-dose aspirin daily starting at age 60. ? Taking vitamin and mineral supplements as recommended by your health care provider. What happens during an annual well check? The services and screenings done by your health care provider during your annual well check will depend on your age, overall health, lifestyle risk factors, and family history of disease. Counseling Your health care provider may ask you questions about your:  Alcohol use.  Tobacco use.  Drug use.  Emotional well-being.  Home and relationship well-being.  Sexual activity.  Eating habits.  Work and work Statistician.  Method of birth control.  Menstrual cycle.  Pregnancy history.  Screening You may have the following tests or measurements:  Height, weight, and BMI.  Blood pressure.  Lipid and cholesterol levels. These may be checked every 5 years, or more frequently if you are over 54 years old.  Skin check.  Lung cancer screening. You may have this screening every year starting at age 55 if you have a 30-pack-year history of smoking and currently smoke or have quit within the past 15 years.  Fecal occult blood test (FOBT) of the stool. You may have this test every year starting at age  37.  Flexible sigmoidoscopy or colonoscopy. You may have a sigmoidoscopy every 5 years or a colonoscopy every 10 years starting at age 97.  Hepatitis C blood test.  Hepatitis B blood test.  Sexually transmitted disease (STD) testing.  Diabetes screening. This is done by checking your blood sugar (glucose) after you have not eaten for a while (fasting). You may have this done every 1-3 years.  Mammogram. This may be done every 1-2 years. Talk to your health care provider about when you should start having regular mammograms. This may depend on whether you have a family history of breast cancer.  BRCA-related cancer screening. This may be done if you have a family history of breast, ovarian, tubal, or peritoneal cancers.  Pelvic exam and Pap test. This may be done every 3 years starting at age 69. Starting at age 42, this may be done every 5 years if you have a Pap test in combination with an HPV test.  Bone density scan. This is done to screen for osteoporosis. You may have this scan if you are at high risk for osteoporosis.  Discuss your test results, treatment options, and if necessary, the need for more tests with your health care provider. Vaccines Your health care provider may recommend certain vaccines, such as:  Influenza vaccine. This is recommended every year.  Tetanus, diphtheria, and acellular pertussis (Tdap, Td) vaccine. You may need a Td booster every 10 years.  Varicella vaccine. You may need this if you have not been vaccinated.  Zoster vaccine. You may need this after age 41.  Measles, mumps, and  rubella (MMR) vaccine. You may need at least one dose of MMR if you were born in 1957 or later. You may also need a second dose.  Pneumococcal 13-valent conjugate (PCV13) vaccine. You may need this if you have certain conditions and were not previously vaccinated.  Pneumococcal polysaccharide (PPSV23) vaccine. You may need one or two doses if you smoke cigarettes or if you  have certain conditions.  Meningococcal vaccine. You may need this if you have certain conditions.  Hepatitis A vaccine. You may need this if you have certain conditions or if you travel or work in places where you may be exposed to hepatitis A.  Hepatitis B vaccine. You may need this if you have certain conditions or if you travel or work in places where you may be exposed to hepatitis B.  Haemophilus influenzae type b (Hib) vaccine. You may need this if you have certain conditions.  Talk to your health care provider about which screenings and vaccines you need and how often you need them. This information is not intended to replace advice given to you by your health care provider. Make sure you discuss any questions you have with your health care provider. Document Released: 10/31/2015 Document Revised: 06/23/2016 Document Reviewed: 08/05/2015 Elsevier Interactive Patient Education  [employer].
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
## 2018-05-03 LAB — CYTOLOGY - PAP
CHLAMYDIA, DNA PROBE: NEGATIVE
DIAGNOSIS: NEGATIVE
HPV: NOT DETECTED
NEISSERIA GONORRHEA: NEGATIVE

## 2018-05-05 ENCOUNTER — Other Ambulatory Visit: Payer: Self-pay | Admitting: Pharmacist Clinician (PhC)/ Clinical Pharmacy Specialist

## 2018-05-05 MED ORDER — BICTEGRAVIR-EMTRICITAB-TENOFOV 50-200-25 MG PO TABS: 1.0000 | ORAL_TABLET | Freq: Every day | ORAL | 3 refills | Status: DC

## 2018-05-05 MED FILL — BIKTARVY 50-200-25 MG TABS: 50-200-25 | 90 days supply | Qty: 90 | Fill #0

## 2018-05-05 NOTE — Progress Notes (Signed)
Pt is out of meds due to refills. She previously getting it at [facility] but tx to [facility] because she would like the 90d supply. We will send to [facility] today so they can mail out to her. She has a lot of transportation issue.

## 2018-05-25 ENCOUNTER — Ambulatory Visit (HOSPITAL_COMMUNITY)
Admission: EM | Admit: 2018-05-25 | Discharge: 2018-05-25 | Disposition: A | Discharge status: Home / Self Care | Payer: Medicare Other | Attending: Family Medicine | Admitting: Family Medicine

## 2018-05-25 ENCOUNTER — Encounter (HOSPITAL_COMMUNITY): Payer: Self-pay

## 2018-05-25 ENCOUNTER — Ambulatory Visit (INDEPENDENT_AMBULATORY_CARE_PROVIDER_SITE_OTHER): Payer: Medicare Other

## 2018-05-25 DIAGNOSIS — S92245A Nondisplaced fracture of medial cuneiform of left foot, initial encounter for closed fracture: Secondary | ICD-10-CM
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
## 2018-05-29 ENCOUNTER — Ambulatory Visit: Payer: Self-pay | Admitting: Physical Therapy

## 2018-05-29 ENCOUNTER — Ambulatory Visit: Payer: Medicare Other | Admitting: Physical Therapy

## 2018-07-03 ENCOUNTER — Ambulatory Visit: Payer: Medicare Other | Attending: Nurse Practitioner | Admitting: Physical Therapy

## 2018-07-03 DIAGNOSIS — G8221 Paraplegia, complete: Secondary | ICD-10-CM | POA: Insufficient documentation

## 2018-07-04 ENCOUNTER — Other Ambulatory Visit: Payer: Self-pay

## 2018-07-04 ENCOUNTER — Encounter: Payer: Self-pay | Admitting: Physical Therapy
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
## 2018-07-27 MED FILL — BIKTARVY 50-200-25 MG TABS: 50-200-25 | 90 days supply | Qty: 90 | Fill #1

## 2018-09-29 ENCOUNTER — Encounter: Payer: Self-pay | Admitting: Plastic Surgery

## 2018-09-29 ENCOUNTER — Ambulatory Visit (INDEPENDENT_AMBULATORY_CARE_PROVIDER_SITE_OTHER): Payer: Medicare Other | Admitting: Plastic Surgery

## 2018-09-29 VITALS — BP 108/70 | HR 74

## 2018-09-29 DIAGNOSIS — N62 Hypertrophy of breast: Secondary | ICD-10-CM

## 2018-09-29 DIAGNOSIS — Z794 Long term (current) use of insulin: Secondary | ICD-10-CM

## 2018-09-29 DIAGNOSIS — S21339A Puncture wound without foreign body of unspecified front wall of thorax with penetration into thoracic cavity, initial encounter: Secondary | ICD-10-CM

## 2018-09-29 DIAGNOSIS — Z113 Encounter for screening for infections with a predominantly sexual mode of transmission: Secondary | ICD-10-CM | POA: Diagnosis not present

## 2018-09-29 DIAGNOSIS — E0829 Diabetes mellitus due to underlying condition with other diabetic kidney complication: Secondary | ICD-10-CM

## 2018-09-29 DIAGNOSIS — M549 Dorsalgia, unspecified: Secondary | ICD-10-CM | POA: Insufficient documentation

## 2018-09-29 DIAGNOSIS — G8929 Other chronic pain: Secondary | ICD-10-CM

## 2018-09-29 DIAGNOSIS — M546 Pain in thoracic spine: Secondary | ICD-10-CM

## 2018-09-29 DIAGNOSIS — W3400XA Accidental discharge from unspecified firearms or gun, initial encounter: Secondary | ICD-10-CM

## 2018-09-29 DIAGNOSIS — M545 Low back pain, unspecified: Secondary | ICD-10-CM | POA: Insufficient documentation
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
## 2018-10-20 MED FILL — BIKTARVY 50-200-25 MG TABS: 50-200-25 | 90 days supply | Qty: 90 | Fill #2

## 2018-11-29 ENCOUNTER — Other Ambulatory Visit: Payer: Medicare Other

## 2018-11-29 DIAGNOSIS — E782 Mixed hyperlipidemia: Secondary | ICD-10-CM

## 2018-11-29 DIAGNOSIS — B20 Human immunodeficiency virus [HIV] disease: Secondary | ICD-10-CM

## 2018-11-30 LAB — T-HELPER CELL (CD4) - (RCID CLINIC ONLY)
CD4 % Helper T Cell: 34 % (ref 33–55)
CD4 T Cell Abs: 580 /uL (ref 400–2700)

## 2018-12-01 LAB — CBC WITH DIFFERENTIAL/PLATELET
Absolute Monocytes: 1030 cells/uL — ABNORMAL HIGH (ref 200–950)
Basophils Absolute: 31 cells/uL (ref 0–200)
Basophils Relative: 0.3 %
Eosinophils Absolute: 42 cells/uL (ref 15–500)
Eosinophils Relative: 0.4 %
HCT: 36.6 % (ref 35.0–45.0)
Hemoglobin: 12.4 g/dL (ref 11.7–15.5)
Lymphs Abs: 1914 cells/uL (ref 850–3900)
MCH: 28.7 pg (ref 27.0–33.0)
MCHC: 33.9 g/dL (ref 32.0–36.0)
MCV: 84.7 fL (ref 80.0–100.0)
MONOS PCT: 9.9 %
MPV: 12.7 fL — ABNORMAL HIGH (ref 7.5–12.5)
Neutro Abs: 7384 cells/uL (ref 1500–7800)
Neutrophils Relative %: 71 %
Platelets: 137 10*3/uL — ABNORMAL LOW (ref 140–400)
RBC: 4.32 10*6/uL (ref 3.80–5.10)
RDW: 13.5 % (ref 11.0–15.0)
Total Lymphocyte: 18.4 %
WBC: 10.4 10*3/uL (ref 3.8–10.8)

## 2018-12-01 LAB — COMPLETE METABOLIC PANEL WITH GFR
AG Ratio: 1.4 (calc) (ref 1.0–2.5)
ALT: 13 U/L (ref 6–29)
AST: 8 U/L — AB (ref 10–35)
Albumin: 3.6 g/dL (ref 3.6–5.1)
Alkaline phosphatase (APISO): 110 U/L (ref 37–153)
BILIRUBIN TOTAL: 1 mg/dL (ref 0.2–1.2)
BUN: 8 mg/dL (ref 7–25)
CO2: 24 mmol/L (ref 20–32)
Calcium: 9.1 mg/dL (ref 8.6–10.4)
Chloride: 105 mmol/L (ref 98–110)
Creat: 0.66 mg/dL (ref 0.50–1.05)
GFR, Est African American: 119 mL/min/{1.73_m2} (ref >=60)
GFR, Est Non African American: 103 mL/min/{1.73_m2} (ref >=60)
Globulin: 2.6 g/dL (calc) (ref 1.9–3.7)
Glucose, Bld: 179 mg/dL — ABNORMAL HIGH (ref 65–99)
Potassium: 3.9 mmol/L (ref 3.5–5.3)
SODIUM: 138 mmol/L (ref 135–146)
Total Protein: 6.2 g/dL (ref 6.1–8.1)

## 2018-12-01 LAB — HIV-1 RNA QUANT-NO REFLEX-BLD
HIV 1 RNA QUANT: NOT DETECTED {copies}/mL
HIV-1 RNA Quant, Log: <1.3 Log copies/mL

## 2018-12-13 ENCOUNTER — Ambulatory Visit (INDEPENDENT_AMBULATORY_CARE_PROVIDER_SITE_OTHER): Payer: Medicare Other | Admitting: Infectious Disease

## 2018-12-13 ENCOUNTER — Encounter: Payer: Self-pay | Admitting: Infectious Disease

## 2018-12-13 VITALS — BP 107/75 | HR 66 | Temp 97.6°F

## 2018-12-13 DIAGNOSIS — Z113 Encounter for screening for infections with a predominantly sexual mode of transmission: Secondary | ICD-10-CM

## 2018-12-13 DIAGNOSIS — N62 Hypertrophy of breast: Secondary | ICD-10-CM

## 2018-12-13 DIAGNOSIS — Z79899 Other long term (current) drug therapy: Secondary | ICD-10-CM

## 2018-12-13 DIAGNOSIS — B20 Human immunodeficiency virus [HIV] disease: Secondary | ICD-10-CM | POA: Diagnosis not present

## 2018-12-13 MED ORDER — BICTEGRAVIR-EMTRICITAB-TENOFOV 50-200-25 MG PO TABS: 1.0000 | ORAL_TABLET | Freq: Every day | ORAL | 3 refills | Status: DC
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
## 2018-12-15 ENCOUNTER — Encounter: Payer: Self-pay | Admitting: Plastic Surgery

## 2018-12-15 ENCOUNTER — Ambulatory Visit (INDEPENDENT_AMBULATORY_CARE_PROVIDER_SITE_OTHER): Payer: Self-pay | Admitting: Plastic Surgery

## 2018-12-15 VITALS — BP 116/78 | HR 74 | Temp 97.7°F | Ht 62.0 in | Wt 210.0 lb

## 2018-12-15 DIAGNOSIS — N62 Hypertrophy of breast: Secondary | ICD-10-CM

## 2018-12-15 DIAGNOSIS — M546 Pain in thoracic spine: Secondary | ICD-10-CM

## 2018-12-15 DIAGNOSIS — G8929 Other chronic pain: Secondary | ICD-10-CM

## 2018-12-15 MED ORDER — OXYCODONE-ACETAMINOPHEN 7.5-325 MG PO TABS: 1.0000 | ORAL_TABLET | Freq: Three times a day (TID) | ORAL | 0 refills | Status: AC | PRN

## 2018-12-15 MED ORDER — CEPHALEXIN 500 MG PO CAPS: 500.0000 mg | ORAL_CAPSULE | Freq: Four times a day (QID) | ORAL | 0 refills | Status: AC

## 2018-12-15 MED FILL — OXYCODONE/APAP 7.5/325MG: 7.5-325 | 5 days supply | Qty: 15 | Fill #0

## 2018-12-15 MED FILL — CEPHALEXIN 500 MG CAPSULE: 500 | 5 days supply | Qty: 20 | Fill #0
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
## 2018-12-19 NOTE — Pre-Procedure Instructions (Signed)
Ellen Lane  12/19/2018      [facility], [place] [address] Phone: [phone] Fax: [place], [place] [address] [place] [address] Phone: [phone] Fax: [phone]  [facility] [place], [place] - [place] [address] [place] [address] Phone: [phone] Fax: [phone]    Your procedure is scheduled on Mon., December 25, 2018 from 7:30AM-11:00AM  Report to [facility] Entrance "A" at 5:30AM  Call this number if you have problems the morning of surgery:  [phone]   Remember:  Do not eat or drink after midnight on March 8th    Take these medicines the morning of surgery with A SIP OF WATER: Baclofen (LIORESAL), Bictegravir-emtricitabine-tenofovir AF (BIKTARVY), and Oxybutynin (DITROPAN)  If needed: Ondansetron (ZOFRAN-ODT) and OxyCODONE-acetaminophen (PERCOCET)   Follow your surgeon's instructions on when to stop Aspirin.  If no instructions were given by your surgeon then you will need to call the office to get those instructions.   As of today, stop taking all Other Aspirin Products, Vitamins, Fish oils, and Herbal medications. Also stop all NSAIDS i.e. Advil, Ibuprofen, Motrin, Aleve, Anaprox, Naproxen, BC, Goody Powders, and all Supplements.   . Do not take MetFORMIN (GLUCOPHAGE) the morning of surgery.   How to Manage Your Diabetes Before and After Surgery  Why is it important to control my blood sugar before and after surgery? . Improving blood sugar levels before and after surgery helps healing and can limit problems. . A way of improving blood sugar control is eating a healthy diet by: o  Eating less sugar and carbohydrates o  Increasing activity/exercise o  Talking with your doctor about reaching your blood  sugar goals . High blood sugars (greater than 180 mg/dL) can raise your risk of infections and slow your recovery, so you will need to focus on controlling your diabetes during the weeks before surgery. . Make sure that the doctor who takes care of your diabetes knows about your planned surgery including the date and location.  How do I manage my blood sugar before surgery? . Check your blood sugar at least 4 times a day, starting 2 days before surgery, to make sure that the level is not too high or low. o Check your blood sugar the morning of your surgery when you wake up and every 2 hours until you get to the Short Stay unit. . If your blood sugar is less than 70 mg/dL, you will need to treat for low blood sugar: o Do not take insulin. o Treat a low blood sugar (less than 70 mg/dL) with  cup of clear juice (cranberry or apple), 4 glucose tablets, OR glucose gel. Recheck blood sugar in 15 minutes after treatment (to make sure it is greater than 70 mg/dL). If your blood sugar is not greater than 70 mg/dL on recheck, call [phone] o  for further instructions.                                      . If your CBG is greater than 220 mg/dL, inform the staff upon arrival to Short Stay .  Marland Kitchen If you are admitted to the hospital after  surgery: o Your blood sugar will be checked by the staff and you will probably be given insulin after surgery (instead of oral diabetes medicines) to make sure you have good blood sugar levels. o The goal for blood sugar control after surgery is 80-180 mg/dL.  Reviewed and Endorsed by [facility] Patient Education Committee, August 2015    Do not wear jewelry, make-up or nail polish.  Do not wear lotions, powders, or perfumes, or deodorant.  Do not shave 48 hours prior to surgery.    Do not bring valuables to the hospital.  [facility] is not responsible for any belongings or valuables.  Contacts, dentures or bridgework may not be worn into surgery.  Leave your  suitcase in the car.  After surgery it may be brought to your room.  For patients admitted to the hospital, discharge time will be determined by your treatment team.  Patients discharged the day of surgery will not be allowed to drive home.   Special instructions:   [place]- Preparing For Surgery  Before surgery, you can play an important role. Because skin is not sterile, your skin needs to be as free of germs as possible. You can reduce the number of germs on your skin by washing with CHG (chlorahexidine gluconate) Soap before surgery.  CHG is an antiseptic cleaner which kills germs and bonds with the skin to continue killing germs even after washing.    Oral Hygiene is also important to reduce your risk of infection.  Remember - BRUSH YOUR TEETH THE MORNING OF SURGERY WITH YOUR REGULAR TOOTHPASTE  Please do not use if you have an allergy to CHG or antibacterial soaps. If your skin becomes reddened/irritated stop using the CHG.  Do not shave (including legs and underarms) for at least 48 hours prior to first CHG shower. It is OK to shave your face.  Please follow these instructions carefully.   1. Shower the NIGHT BEFORE SURGERY and the MORNING OF SURGERY with CHG.   2. If you chose to wash your hair, wash your hair first as usual with your normal shampoo.  3. After you shampoo, rinse your hair and body thoroughly to remove the shampoo.  4. Use CHG as you would any other liquid soap. You can apply CHG directly to the skin and wash gently with a scrungie or a clean washcloth.   5. Apply the CHG Soap to your body ONLY FROM THE NECK DOWN.  Do not use on open wounds or open sores. Avoid contact with your eyes, ears, mouth and genitals (private parts). Wash Face and genitals (private parts)  with your normal soap.  6. Wash thoroughly, paying special attention to the area where your surgery will be performed.  7. Thoroughly rinse your body with warm water from the neck down.  8. DO  NOT shower/wash with your normal soap after using and rinsing off the CHG Soap.  9. Pat yourself dry with a CLEAN TOWEL.  10. Wear CLEAN PAJAMAS to bed the night before surgery, wear comfortable clothes the morning of surgery  11. Place CLEAN SHEETS on your bed the night of your first shower and DO NOT SLEEP WITH PETS.  Day of Surgery:  Do not apply any deodorants/lotions.  Please wear clean clothes to the hospital/surgery center.   Remember to brush your teeth WITH YOUR REGULAR TOOTHPASTE.  Please read over the following fact sheets that you were given. Pain Booklet, Coughing and Deep Breathing and Surgical Site Infection Prevention

## 2018-12-20 ENCOUNTER — Other Ambulatory Visit: Payer: Self-pay

## 2018-12-20 ENCOUNTER — Encounter (HOSPITAL_COMMUNITY): Payer: Self-pay

## 2018-12-20 ENCOUNTER — Ambulatory Visit (HOSPITAL_COMMUNITY)
Admission: RE | Admit: 2018-12-20 | Discharge: 2018-12-20 | Disposition: A | Discharge status: Home / Self Care | Payer: Medicare Other | Source: Ambulatory Visit | Attending: Plastic Surgery | Admitting: Plastic Surgery

## 2018-12-20 DIAGNOSIS — Z01818 Encounter for other preprocedural examination: Secondary | ICD-10-CM | POA: Diagnosis not present

## 2018-12-20 HISTORY — DX: Paralytic syndrome, unspecified: G83.9

## 2018-12-20 HISTORY — DX: Sleep apnea, unspecified: G47.30

## 2018-12-20 HISTORY — DX: Anemia, unspecified: D64.9

## 2018-12-20 LAB — CBC
HCT: 39.4 % (ref 36.0–46.0)
Hemoglobin: 12.6 g/dL (ref 12.0–15.0)
MCH: 27.2 pg (ref 26.0–34.0)
MCHC: 32 g/dL (ref 30.0–36.0)
MCV: 85.1 fL (ref 80.0–100.0)
PLATELETS: 192 10*3/uL (ref 150–400)
RBC: 4.63 MIL/uL (ref 3.87–5.11)
RDW: 14.8 % (ref 11.5–15.5)
WBC: 6.8 10*3/uL (ref 4.0–10.5)
nRBC: 0 % (ref 0.0–0.2)

## 2018-12-20 LAB — BASIC METABOLIC PANEL
Anion gap: 9 (ref 5–15)
BUN: 12 mg/dL (ref 6–20)
CALCIUM: 9.5 mg/dL (ref 8.9–10.3)
CO2: 21 mmol/L — ABNORMAL LOW (ref 22–32)
Chloride: 109 mmol/L (ref 98–111)
Creatinine, Ser: 0.54 mg/dL (ref 0.44–1.00)
GFR calc Af Amer: >60 mL/min (ref >60)
GFR calc non Af Amer: >60 mL/min (ref >60)
Glucose, Bld: 131 mg/dL — ABNORMAL HIGH (ref 70–99)
Potassium: 4 mmol/L (ref 3.5–5.1)
SODIUM: 139 mmol/L (ref 135–145)

## 2018-12-20 LAB — GLUCOSE, CAPILLARY: Glucose-Capillary: 93 mg/dL (ref 70–99)

## 2018-12-20 LAB — HEMOGLOBIN A1C
Hgb A1c MFr Bld: 7.2 % — ABNORMAL HIGH (ref 4.8–5.6)
Mean Plasma Glucose: 159.94 mg/dL

## 2018-12-20 NOTE — Progress Notes (Signed)
PCP - PA Vicenta Aly   Cardiologist - Dr. Mauricio Po  Chest x-ray - Denies  EKG - 12/20/2018  Stress Test - Denies  ECHO - 02/10/15 (CE)- req'd  Cardiac Cath - Denies  AICD-na PM-na LOOP-na  Sleep Study - Yes- Positive CPAP - None  LABS- 12/20/2018: CBC, BMP  ASA- Cont.  HA1C- 12/20/2018 Fasting Blood Sugar - 108-112, Today 93 Checks Blood Sugar __3___ times a day  Anesthesia-Yes- req'd records  Pt denies having chest pain, sob, or fever at this time. All instructions explained to the pt, with a verbal understanding of the material. Pt agrees to go over the instructions while at home for a better understanding. The opportunity to ask questions was provided.
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
## 2018-12-22 ENCOUNTER — Encounter (HOSPITAL_COMMUNITY): Payer: Self-pay | Admitting: Certified Registered Nurse Anesthetist

## 2018-12-25 ENCOUNTER — Observation Stay (HOSPITAL_COMMUNITY)
Admission: RE | Admit: 2018-12-25 | Discharge: 2018-12-26 | Disposition: A | Discharge status: Home / Self Care | Payer: Medicare Other | Attending: Plastic Surgery | Admitting: Plastic Surgery

## 2018-12-25 ENCOUNTER — Encounter (HOSPITAL_COMMUNITY)
Admission: RE | Disposition: A | Discharge status: Home / Self Care | Payer: Self-pay | Source: Home / Self Care | Attending: Plastic Surgery

## 2018-12-25 ENCOUNTER — Ambulatory Visit (HOSPITAL_COMMUNITY): Payer: Medicare Other | Admitting: Certified Registered Nurse Anesthetist

## 2018-12-25 ENCOUNTER — Ambulatory Visit (HOSPITAL_COMMUNITY): Payer: Medicare Other | Admitting: Physician Assistant

## 2018-12-25 ENCOUNTER — Other Ambulatory Visit: Payer: Self-pay

## 2018-12-25 ENCOUNTER — Encounter (HOSPITAL_COMMUNITY): Payer: Self-pay

## 2018-12-25 DIAGNOSIS — I10 Essential (primary) hypertension: Secondary | ICD-10-CM | POA: Insufficient documentation

## 2018-12-25 DIAGNOSIS — Z6838 Body mass index (BMI) 38.0-38.9, adult: Secondary | ICD-10-CM | POA: Diagnosis not present

## 2018-12-25 DIAGNOSIS — G4730 Sleep apnea, unspecified: Secondary | ICD-10-CM | POA: Diagnosis not present

## 2018-12-25 DIAGNOSIS — N319 Neuromuscular dysfunction of bladder, unspecified: Secondary | ICD-10-CM | POA: Insufficient documentation

## 2018-12-25 DIAGNOSIS — Z79899 Other long term (current) drug therapy: Secondary | ICD-10-CM | POA: Insufficient documentation

## 2018-12-25 DIAGNOSIS — M546 Pain in thoracic spine: Secondary | ICD-10-CM | POA: Diagnosis not present

## 2018-12-25 DIAGNOSIS — B20 Human immunodeficiency virus [HIV] disease: Secondary | ICD-10-CM | POA: Insufficient documentation

## 2018-12-25 DIAGNOSIS — N62 Hypertrophy of breast: Principal | ICD-10-CM | POA: Insufficient documentation

## 2018-12-25 DIAGNOSIS — E119 Type 2 diabetes mellitus without complications: Secondary | ICD-10-CM | POA: Diagnosis not present

## 2018-12-25 DIAGNOSIS — M542 Cervicalgia: Secondary | ICD-10-CM | POA: Diagnosis not present

## 2018-12-25 DIAGNOSIS — Z888 Allergy status to other drugs, medicaments and biological substances status: Secondary | ICD-10-CM | POA: Diagnosis not present

## 2018-12-25 DIAGNOSIS — Z882 Allergy status to sulfonamides status: Secondary | ICD-10-CM | POA: Diagnosis not present

## 2018-12-25 DIAGNOSIS — F339 Major depressive disorder, recurrent, unspecified: Secondary | ICD-10-CM | POA: Diagnosis not present

## 2018-12-25 DIAGNOSIS — Z794 Long term (current) use of insulin: Secondary | ICD-10-CM | POA: Insufficient documentation

## 2018-12-25 DIAGNOSIS — G8929 Other chronic pain: Secondary | ICD-10-CM

## 2018-12-25 DIAGNOSIS — G8220 Paraplegia, unspecified: Secondary | ICD-10-CM | POA: Diagnosis not present

## 2018-12-25 DIAGNOSIS — G894 Chronic pain syndrome: Secondary | ICD-10-CM | POA: Insufficient documentation

## 2018-12-25 HISTORY — PX: BREAST REDUCTION SURGERY: SHX8

## 2018-12-25 LAB — GLUCOSE, CAPILLARY
Glucose-Capillary: 110 mg/dL — ABNORMAL HIGH (ref 70–99)
Glucose-Capillary: 168 mg/dL — ABNORMAL HIGH (ref 70–99)
Glucose-Capillary: 278 mg/dL — ABNORMAL HIGH (ref 70–99)
Glucose-Capillary: 237 mg/dL — ABNORMAL HIGH (ref 70–99)

## 2018-12-25 SURGERY — MAMMOPLASTY, REDUCTION
Anesthesia: General | Site: Breast | Laterality: Bilateral

## 2018-12-25 MED ORDER — ACETAMINOPHEN 500 MG PO TABS: 1000.0000 mg | ORAL_TABLET | Freq: Once | ORAL | Status: DC | PRN

## 2018-12-25 MED ORDER — NAPROXEN 250 MG PO TABS: 500.0000 mg | ORAL_TABLET | Freq: Two times a day (BID) | ORAL | Status: DC | PRN

## 2018-12-25 MED ORDER — SODIUM CHLORIDE 0.9 % IV SOLN
INTRAVENOUS | Status: DC | PRN
  Administered 2018-12-25: 20 ug/min via INTRAVENOUS

## 2018-12-25 MED ORDER — ONDANSETRON HCL 4 MG/2ML IJ SOLN
INTRAMUSCULAR | Status: AC
  Filled 2018-12-25: qty 2

## 2018-12-25 MED ORDER — 0.9 % SODIUM CHLORIDE (POUR BTL) OPTIME
TOPICAL | Status: DC | PRN
  Administered 2018-12-25: 1000 mL

## 2018-12-25 MED ORDER — ROCURONIUM BROMIDE 50 MG/5ML IV SOSY
PREFILLED_SYRINGE | INTRAVENOUS | Status: AC
  Filled 2018-12-25: qty 5

## 2018-12-25 MED ORDER — PROPOFOL 10 MG/ML IV BOLUS
INTRAVENOUS | Status: AC
  Filled 2018-12-25: qty 40

## 2018-12-25 MED ORDER — LIDOCAINE-EPINEPHRINE 1 %-1:100000 IJ SOLN
INTRAMUSCULAR | Status: DC | PRN
  Administered 2018-12-25: 20 mL via INTRADERMAL

## 2018-12-25 MED ORDER — LACTATED RINGERS IV SOLN
INTRAVENOUS | Status: DC | PRN
  Administered 2018-12-25 (×2): via INTRAVENOUS

## 2018-12-25 MED ORDER — CEFAZOLIN SODIUM-DEXTROSE 2-4 GM/100ML-% IV SOLN
INTRAVENOUS | Status: AC
  Filled 2018-12-25: qty 100

## 2018-12-25 MED ORDER — PHENYLEPHRINE 40 MCG/ML (10ML) SYRINGE FOR IV PUSH (FOR BLOOD PRESSURE SUPPORT)
PREFILLED_SYRINGE | INTRAVENOUS | Status: DC | PRN
  Administered 2018-12-25 (×3): 80 ug via INTRAVENOUS

## 2018-12-25 MED ORDER — ONDANSETRON HCL 4 MG/2ML IJ SOLN
4.0000 mg | Freq: Four times a day (QID) | INTRAMUSCULAR | Status: DC | PRN
  Administered 2018-12-25 – 2018-12-26 (×3): 4 mg via INTRAVENOUS
  Filled 2018-12-25 (×2): qty 2

## 2018-12-25 MED ORDER — MIDAZOLAM HCL 5 MG/5ML IJ SOLN
INTRAMUSCULAR | Status: DC | PRN
  Administered 2018-12-25: 2 mg via INTRAVENOUS

## 2018-12-25 MED ORDER — ACETAMINOPHEN 160 MG/5ML PO SOLN: 1000.0000 mg | Freq: Once | ORAL | Status: DC | PRN

## 2018-12-25 MED ORDER — LIDOCAINE 2% (20 MG/ML) 5 ML SYRINGE
INTRAMUSCULAR | Status: DC | PRN
  Administered 2018-12-25: 60 mg via INTRAVENOUS

## 2018-12-25 MED ORDER — INSULIN ASPART 100 UNIT/ML ~~LOC~~ SOLN
0.0000 [IU] | Freq: Three times a day (TID) | SUBCUTANEOUS | Status: DC
  Administered 2018-12-25: 5 [IU] via SUBCUTANEOUS
  Administered 2018-12-26: 3 [IU] via SUBCUTANEOUS
  Administered 2018-12-26: 5 [IU] via SUBCUTANEOUS

## 2018-12-25 MED ORDER — MIDAZOLAM HCL 2 MG/2ML IJ SOLN
INTRAMUSCULAR | Status: AC
  Filled 2018-12-25: qty 2

## 2018-12-25 MED ORDER — ACETAMINOPHEN 10 MG/ML IV SOLN: 1000.0000 mg | Freq: Once | INTRAVENOUS | Status: DC | PRN

## 2018-12-25 MED ORDER — INSULIN ASPART 100 UNIT/ML ~~LOC~~ SOLN: 3.0000 [IU] | Freq: Three times a day (TID) | SUBCUTANEOUS | Status: DC

## 2018-12-25 MED ORDER — OXYCODONE HCL 5 MG PO TABS: 5.0000 mg | ORAL_TABLET | Freq: Once | ORAL | Status: DC | PRN

## 2018-12-25 MED ORDER — ONDANSETRON HCL 4 MG/2ML IJ SOLN
INTRAMUSCULAR | Status: DC | PRN
  Administered 2018-12-25: 4 mg via INTRAVENOUS

## 2018-12-25 MED ORDER — CEFAZOLIN SODIUM-DEXTROSE 2-4 GM/100ML-% IV SOLN
2.0000 g | Freq: Three times a day (TID) | INTRAVENOUS | Status: AC
  Administered 2018-12-25 – 2018-12-26 (×3): 2 g via INTRAVENOUS
  Filled 2018-12-25 (×3): qty 100

## 2018-12-25 MED ORDER — LIDOCAINE 2% (20 MG/ML) 5 ML SYRINGE
INTRAMUSCULAR | Status: AC
  Filled 2018-12-25: qty 5

## 2018-12-25 MED ORDER — FENTANYL CITRATE (PF) 100 MCG/2ML IJ SOLN
INTRAMUSCULAR | Status: DC | PRN
  Administered 2018-12-25: 150 ug via INTRAVENOUS

## 2018-12-25 MED ORDER — DEXAMETHASONE SODIUM PHOSPHATE 10 MG/ML IJ SOLN
INTRAMUSCULAR | Status: DC | PRN
  Administered 2018-12-25: 10 mg via INTRAVENOUS

## 2018-12-25 MED ORDER — CHLORHEXIDINE GLUCONATE CLOTH 2 % EX PADS: 6.0000 | MEDICATED_PAD | Freq: Once | CUTANEOUS | Status: DC

## 2018-12-25 MED ORDER — FENTANYL CITRATE (PF) 100 MCG/2ML IJ SOLN
25.0000 ug | INTRAMUSCULAR | Status: DC | PRN
  Administered 2018-12-25 (×3): 50 ug via INTRAVENOUS

## 2018-12-25 MED ORDER — PHENYLEPHRINE 40 MCG/ML (10ML) SYRINGE FOR IV PUSH (FOR BLOOD PRESSURE SUPPORT)
PREFILLED_SYRINGE | INTRAVENOUS | Status: AC
  Filled 2018-12-25: qty 10

## 2018-12-25 MED ORDER — HYDROCODONE-ACETAMINOPHEN 5-325 MG PO TABS: 1.0000 | ORAL_TABLET | ORAL | Status: DC | PRN

## 2018-12-25 MED ORDER — ROCURONIUM BROMIDE 50 MG/5ML IV SOSY
PREFILLED_SYRINGE | INTRAVENOUS | Status: DC | PRN
  Administered 2018-12-25: 30 mg via INTRAVENOUS
  Administered 2018-12-25: 50 mg via INTRAVENOUS
  Administered 2018-12-25 (×2): 20 mg via INTRAVENOUS

## 2018-12-25 MED ORDER — POLYETHYLENE GLYCOL 3350 17 G PO PACK: 17.0000 g | PACK | Freq: Every day | ORAL | Status: DC | PRN

## 2018-12-25 MED ORDER — ACETAMINOPHEN 325 MG PO TABS
325.0000 mg | ORAL_TABLET | Freq: Four times a day (QID) | ORAL | Status: DC
  Administered 2018-12-25 – 2018-12-26 (×3): 325 mg via ORAL
  Filled 2018-12-25 (×3): qty 1

## 2018-12-25 MED ORDER — FENTANYL CITRATE (PF) 250 MCG/5ML IJ SOLN
INTRAMUSCULAR | Status: AC
  Filled 2018-12-25: qty 5

## 2018-12-25 MED ORDER — HYDROMORPHONE HCL 1 MG/ML IJ SOLN
1.0000 mg | INTRAMUSCULAR | Status: DC | PRN
  Administered 2018-12-25: 1 mg via INTRAVENOUS
  Filled 2018-12-25: qty 1

## 2018-12-25 MED ORDER — FENTANYL CITRATE (PF) 100 MCG/2ML IJ SOLN
INTRAMUSCULAR | Status: AC
  Filled 2018-12-25: qty 2

## 2018-12-25 MED ORDER — INSULIN ASPART 100 UNIT/ML ~~LOC~~ SOLN: 0.0000 [IU] | Freq: Three times a day (TID) | SUBCUTANEOUS | Status: DC

## 2018-12-25 MED ORDER — OXYCODONE-ACETAMINOPHEN 5-325 MG PO TABS
1.0000 | ORAL_TABLET | Freq: Four times a day (QID) | ORAL | Status: DC | PRN
  Administered 2018-12-25 – 2018-12-26 (×3): 1 via ORAL
  Filled 2018-12-25 (×3): qty 1

## 2018-12-25 MED ORDER — FENTANYL CITRATE (PF) 100 MCG/2ML IJ SOLN
INTRAMUSCULAR | Status: AC
  Administered 2018-12-25: 50 ug via INTRAVENOUS
  Filled 2018-12-25: qty 2

## 2018-12-25 MED ORDER — PROPOFOL 10 MG/ML IV BOLUS
INTRAVENOUS | Status: DC | PRN
  Administered 2018-12-25: 140 mg via INTRAVENOUS

## 2018-12-25 MED ORDER — CEFAZOLIN SODIUM-DEXTROSE 2-4 GM/100ML-% IV SOLN
2.0000 g | INTRAVENOUS | Status: AC
  Administered 2018-12-25: 2 g via INTRAVENOUS

## 2018-12-25 MED ORDER — ONDANSETRON 4 MG PO TBDP: 4.0000 mg | ORAL_TABLET | Freq: Four times a day (QID) | ORAL | Status: DC | PRN

## 2018-12-25 MED ORDER — SENNA 8.6 MG PO TABS
1.0000 | ORAL_TABLET | Freq: Two times a day (BID) | ORAL | Status: DC
  Filled 2018-12-25 (×2): qty 1

## 2018-12-25 MED ORDER — DIPHENHYDRAMINE HCL 12.5 MG/5ML PO ELIX: 12.5000 mg | ORAL_SOLUTION | Freq: Four times a day (QID) | ORAL | Status: DC | PRN

## 2018-12-25 MED ORDER — PHENOL 1.4 % MT LIQD
1.0000 | OROMUCOSAL | Status: DC | PRN
  Administered 2018-12-25: 1 via OROMUCOSAL
  Filled 2018-12-25: qty 177

## 2018-12-25 MED ORDER — SUGAMMADEX SODIUM 200 MG/2ML IV SOLN
INTRAVENOUS | Status: DC | PRN
  Administered 2018-12-25: 200 mg via INTRAVENOUS

## 2018-12-25 MED ORDER — DEXAMETHASONE SODIUM PHOSPHATE 10 MG/ML IJ SOLN
INTRAMUSCULAR | Status: AC
  Filled 2018-12-25: qty 1

## 2018-12-25 MED ORDER — DIPHENHYDRAMINE HCL 50 MG/ML IJ SOLN: 12.5000 mg | Freq: Four times a day (QID) | INTRAMUSCULAR | Status: DC | PRN

## 2018-12-25 MED ORDER — LIDOCAINE-EPINEPHRINE 1 %-1:100000 IJ SOLN
INTRAMUSCULAR | Status: AC
  Filled 2018-12-25: qty 1

## 2018-12-25 MED ORDER — OXYCODONE HCL 5 MG/5ML PO SOLN: 5.0000 mg | Freq: Once | ORAL | Status: DC | PRN

## 2018-12-25 SURGICAL SUPPLY — 76 items
ADH SKN CLS APL DERMABOND .7 (GAUZE/BANDAGES/DRESSINGS) ×7
BAG DECANTER FOR FLEXI CONT (MISCELLANEOUS) ×1 IMPLANT
BINDER BREAST 3XL (GAUZE/BANDAGES/DRESSINGS) ×2 IMPLANT
BINDER BREAST LRG (GAUZE/BANDAGES/DRESSINGS) IMPLANT
BINDER BREAST MEDIUM (GAUZE/BANDAGES/DRESSINGS) IMPLANT
BINDER BREAST XLRG (GAUZE/BANDAGES/DRESSINGS) IMPLANT
BINDER BREAST XXLRG (GAUZE/BANDAGES/DRESSINGS) IMPLANT
BIOPATCH RED 1 DISK 7.0 (GAUZE/BANDAGES/DRESSINGS) ×4 IMPLANT
BIOPATCH RED 1IN DISK 7.0MM (GAUZE/BANDAGES/DRESSINGS) ×2
BLADE 10 SAFETY STRL DISP (BLADE) ×7 IMPLANT
BLADE HEX COATED 2.75 (ELECTRODE) ×1 IMPLANT
BLADE SURG 15 STRL LF DISP TIS (BLADE) ×2 IMPLANT
BLADE SURG 15 STRL SS (BLADE) ×3
BNDG GAUZE ELAST 4 BULKY (GAUZE/BANDAGES/DRESSINGS) ×2 IMPLANT
CANISTER SUCTION 1200CC (MISCELLANEOUS) ×3 IMPLANT
CHLORAPREP W/TINT 26ML (MISCELLANEOUS) ×7 IMPLANT
CONT PATH 16OZ SNAP LID 3702 (MISCELLANEOUS) ×2 IMPLANT
COVER WAND RF STERILE (DRAPES) ×1 IMPLANT
DECANTER SPIKE VIAL GLASS SM (MISCELLANEOUS) IMPLANT
DERMABOND ADVANCED (GAUZE/BANDAGES/DRESSINGS) ×14
DERMABOND ADVANCED .7 DNX12 (GAUZE/BANDAGES/DRESSINGS) ×2 IMPLANT
DRAIN CHANNEL 19F RND (DRAIN) ×8 IMPLANT
DRAPE LAPAROSCOPIC ABDOMINAL (DRAPES) ×3 IMPLANT
DRSG PAD ABDOMINAL 8X10 ST (GAUZE/BANDAGES/DRESSINGS) ×6 IMPLANT
ELECT BLADE 4.0 EZ CLEAN MEGAD (MISCELLANEOUS) ×3
ELECT CAUTERY BLADE 6.4 (BLADE) ×2 IMPLANT
ELECT REM PT RETURN 9FT ADLT (ELECTROSURGICAL) ×3
ELECTRODE BLDE 4.0 EZ CLN MEGD (MISCELLANEOUS) ×1 IMPLANT
ELECTRODE REM PT RTRN 9FT ADLT (ELECTROSURGICAL) ×1 IMPLANT
EVACUATOR SILICONE 100CC (DRAIN) ×8 IMPLANT
GAUZE SPONGE 4X4 12PLY STRL LF (GAUZE/BANDAGES/DRESSINGS) ×2 IMPLANT
GLOVE BIO SURGEON STRL SZ 6.5 (GLOVE) ×10 IMPLANT
GLOVE BIO SURGEONS STRL SZ 6.5 (GLOVE) ×8
GLOVE BIOGEL M 6.5 STRL (GLOVE) ×2 IMPLANT
GLOVE BIOGEL PI IND STRL 6.5 (GLOVE) IMPLANT
GLOVE BIOGEL PI IND STRL 8 (GLOVE) IMPLANT
GLOVE BIOGEL PI INDICATOR 6.5 (GLOVE) ×4
GLOVE BIOGEL PI INDICATOR 8 (GLOVE) ×4
GLOVE SS BIOGEL STRL SZ 7.5 (GLOVE) IMPLANT
GLOVE SUPERSENSE BIOGEL SZ 7.5 (GLOVE) ×4
GLOVE SURG SS PI 8.0 STRL IVOR (GLOVE) ×4 IMPLANT
GOWN STRL REUS W/ TWL LRG LVL3 (GOWN DISPOSABLE) ×2 IMPLANT
GOWN STRL REUS W/TWL 2XL LVL3 (GOWN DISPOSABLE) ×2 IMPLANT
GOWN STRL REUS W/TWL LRG LVL3 (GOWN DISPOSABLE) ×18
MARKER SKIN DUAL TIP RULER LAB (MISCELLANEOUS) ×2 IMPLANT
NDL HYPO 25GX1X1/2 BEV (NEEDLE) IMPLANT
NDL SPNL 22GX3.5 QUINCKE BK (NEEDLE) ×1 IMPLANT
NEEDLE HYPO 25GX1X1/2 BEV (NEEDLE) ×3 IMPLANT
NEEDLE SPNL 22GX3.5 QUINCKE BK (NEEDLE) IMPLANT
NS IRRIG 1000ML POUR BTL (IV SOLUTION) ×3 IMPLANT
PACK GENERAL/GYN (CUSTOM PROCEDURE TRAY) ×3 IMPLANT
PAD ABD 8X10 STRL (GAUZE/BANDAGES/DRESSINGS) ×2 IMPLANT
PIN SAFETY STERILE (MISCELLANEOUS) ×4 IMPLANT
SPECIMEN JAR LARGE (MISCELLANEOUS) ×6 IMPLANT
SPECIMEN JAR X LARGE (MISCELLANEOUS) ×2 IMPLANT
SPONGE LAP 18X18 RF (DISPOSABLE) ×4 IMPLANT
SPONGE LAP 4X18 RFD (DISPOSABLE) ×2 IMPLANT
SUT MNCRL AB 3-0 PS2 18 (SUTURE) ×6 IMPLANT
SUT MNCRL AB 4-0 PS2 18 (SUTURE) ×18 IMPLANT
SUT MON AB 3-0 SH 27 (SUTURE) ×12
SUT MON AB 3-0 SH27 (SUTURE) IMPLANT
SUT MON AB 5-0 PS2 18 (SUTURE) ×18 IMPLANT
SUT PDS AB 2-0 CT2 27 (SUTURE) IMPLANT
SUT SILK 2 0 PERMA HAND 18 BK (SUTURE) ×4 IMPLANT
SUT SILK 3 0 PS 1 (SUTURE) IMPLANT
SUT VIC AB 3-0 SH 27 (SUTURE)
SUT VIC AB 3-0 SH 27X BRD (SUTURE) ×2 IMPLANT
SUT VICRYL 4-0 PS2 18IN ABS (SUTURE) ×6 IMPLANT
SYR 50ML LL SCALE MARK (SYRINGE) IMPLANT
SYR CONTROL 10ML LL (SYRINGE) ×2 IMPLANT
TOWEL OR 17X24 6PK STRL BLUE (TOWEL DISPOSABLE) ×6 IMPLANT
TRAY FOLEY W/BAG SLVR 14FR (SET/KITS/TRAYS/PACK) ×2 IMPLANT
TUBE CONNECTING 20'X1/4 (TUBING)
TUBE CONNECTING 20X1/4 (TUBING) ×1 IMPLANT
UNDERPAD 30X30 (UNDERPADS AND DIAPERS) ×2 IMPLANT
YANKAUER SUCT BULB TIP NO VENT (SUCTIONS) ×3 IMPLANT
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
## 2018-12-25 NOTE — Progress Notes (Signed)
Pt called me into room, she felt her blood sugar was high. I checked it, it was 72 - phoned MD on call and awaiting call back. No coverage

## 2018-12-25 NOTE — Transfer of Care (Signed)
Immediate Anesthesia Transfer of Care Note  Patient: Ellen Lane  Procedure(s) Performed: MAMMARY REDUCTION  (BREAST) (Bilateral Breast)  Patient Location: PACU  Anesthesia Type:General  Level of Consciousness: awake, alert  and oriented  Airway & Oxygen Therapy: Patient Spontanous Breathing and Patient connected to nasal cannula oxygen  Post-op Assessment: Report given to RN and Post -op Vital signs reviewed and stable  Post vital signs: Reviewed and stable  Last Vitals:  Vitals Value Taken Time  BP    Temp    Pulse    Resp    SpO2      Last Pain:  Vitals:   12/25/18 0647  TempSrc: Oral  PainSc:       Patients Stated Pain Goal: 4 (10/05/74 8832)  Complications: No apparent anesthesia complications

## 2018-12-25 NOTE — Interval H&P Note (Signed)
History and Physical Interval Note:  12/25/2018 7:20 AM  Ellen Lane  has presented today for surgery, with the diagnosis of mammary hypertrophy.  The various methods of treatment have been discussed with the patient and family. After consideration of risks, benefits and other options for treatment, the patient has consented to  Procedure(s) with comments: MAMMARY REDUCTION  (BREAST) (Bilateral) - please adjust case length to reflect 210 min as a surgical intervention.  The patient's history has been reviewed, patient examined, no change in status, stable for surgery.  I have reviewed the patient's chart and labs.  Questions were answered to the patient's satisfaction.     Loel Lofty Dillingham

## 2018-12-25 NOTE — Discharge Instructions (Signed)
INSTRUCTIONS FOR AFTER SURGERY   The following information will help you and your family understand what to expect when you are discharged from the hospital.  Following these guidelines will help ensure a smooth recovery and reduce risks of complications.   Postoperative instructions include information on: diet, wound care, medications and physical activity.  AFTER SURGERY You should be able to go home the morning after surgery.  DIET This surgery does not require a specific diet.  However, I have to mention that the healthier you eat the better your body can start healing. It is important to increasing your protein intake.  This means limiting the foods with sugar and carbohydrates.  Focus on vegetables and some meat.  If you have any liposuction during your procedure be sure to drink water.  If your urine is bright yellow, then it is concentrated, and you need to drink more water.  As a general rule after surgery, you should have 8 ounces of water every hour while awake.  If you find you are persistently nauseated or unable to take in liquids let us know.  NO TOBACCO USE or EXPOSURE.  This will slow your healing process and increase the risk of a wound.  WOUND CARE You can shower the day after surgery if you don't have a drain.  Use fragrance free soap.  Dial, [place] and [place] are usually mild on the skin. If you have a drain clean with baby wipes until the drain is removed.  If you have steri-strips / tape directly attached to your skin leave them in place. It is OK to get these wet.  No baths, pools or hot tubs for two weeks. We close your incision to leave the smallest and best-looking scar. No ointment or creams on your incisions until given the go ahead.  Especially not Neosporin (Too many skin reactions with this one).  A few weeks after surgery you can use Mederma and start massaging the scar. We ask you to wear your binder or sports bra for the first 6 weeks around the clock, including while  sleeping. This provides added comfort and helps reduce the fluid accumulation at the surgery site.  ACTIVITY No heavy lifting until cleared by the doctor.  It is OK to walk and climb stairs. In fact, moving your legs is very important to decrease your risk of a blood clot.  It will also help keep you from getting deconditioned.  Every 1 to 2 hours get up and walk for 5 minutes. This will help with a quicker recovery back to normal.  Let pain be your guide so you don't do too much.  NO, you cannot do the spring cleaning and don't plan on taking care of anyone else.  This is your time for TLC.  You will be more comfortable if you sleep and rest with your head elevated either with a few pillows under you or in a recliner.  No stomach sleeping for a few months.  WORK Everyone returns to work at different times. As a rough guide, most people take at least 1 - 2 weeks off prior to returning to work. If you need documentation for your job, bring the forms to your postoperative follow up visit.  DRIVING Arrange for someone to bring you home from the hospital.  You may be able to drive a few days after surgery but not while taking any narcotics or valium.  BOWEL MOVEMENTS Constipation can occur after anesthesia and while taking pain medication.  It is important to stay ahead for your comfort.  We recommend taking Milk of Magnesia (2 tablespoons; twice a day) while taking the pain pills.  SEROMA This is fluid your body tried to put in the surgical site.  This is normal but if it creates tight skinny skin let us know.  It usually decreases in a few weeks.  WHEN TO CALL Call your surgeon's office if any of the following occur:  Fever 101 degrees F or greater  Excessive bleeding or fluid from the incision site.  Pain that increases over time without aid from the medications  Redness, warmth, or pus draining from incision sites  Persistent nausea or inability to take in liquids  Severe misshapen  area that underwent the operation.

## 2018-12-25 NOTE — Progress Notes (Signed)
Ancef x 3 doses post-op per Dr. Arliss Journey, PharmD, BCIDP, AAHIVP, CPP Infectious Disease Pharmacist 12/25/2018 3:15 PM

## 2018-12-25 NOTE — Anesthesia Procedure Notes (Signed)
Procedure Name: Intubation Date/Time: 12/25/2018 7:36 AM Performed by: Candis Shine, CRNA Pre-anesthesia Checklist: Patient identified, Emergency Drugs available, Suction available and Patient being monitored Patient Re-evaluated:Patient Re-evaluated prior to induction Oxygen Delivery Method: Circle System Utilized Preoxygenation: Pre-oxygenation with 100% oxygen Induction Type: IV induction Ventilation: Mask ventilation without difficulty Laryngoscope Size: Mac and 3 Grade View: Grade I Tube type: Oral Tube size: 7.0 mm Number of attempts: 1 Airway Equipment and Method: Stylet Placement Confirmation: ETT inserted through vocal cords under direct vision,  positive ETCO2 and breath sounds checked- equal and bilateral Secured at: 22 cm Tube secured with: Tape Dental Injury: Teeth and Oropharynx as per pre-operative assessment

## 2018-12-26 ENCOUNTER — Encounter (HOSPITAL_COMMUNITY): Payer: Self-pay | Admitting: Plastic Surgery

## 2018-12-26 DIAGNOSIS — N62 Hypertrophy of breast: Secondary | ICD-10-CM | POA: Diagnosis not present

## 2018-12-26 LAB — GLUCOSE, CAPILLARY
GLUCOSE-CAPILLARY: 189 mg/dL — AB (ref 70–99)
GLUCOSE-CAPILLARY: 249 mg/dL — AB (ref 70–99)

## 2018-12-26 MED ORDER — KCL IN DEXTROSE-NACL 20-5-0.45 MEQ/L-%-% IV SOLN
INTRAVENOUS | Status: DC
  Administered 2018-12-26 (×2): via INTRAVENOUS
  Filled 2018-12-26 (×2): qty 1000

## 2018-12-26 NOTE — Progress Notes (Signed)
Pt's blood pressure this am is 80/60; checked manually and with the dinamap. Pulse 94-102. Pt asymptomatic. Dr. Migdalia Dk informed. Orders in chart.

## 2018-12-26 NOTE — Anesthesia Postprocedure Evaluation (Signed)
Anesthesia Post Note  Patient: Ellen Lane  Procedure(s) Performed: MAMMARY REDUCTION  (BREAST) (Bilateral Breast)     Patient location during evaluation: PACU Anesthesia Type: General Level of consciousness: awake and alert Pain management: pain level controlled Vital Signs Assessment: post-procedure vital signs reviewed and stable Respiratory status: spontaneous breathing, nonlabored ventilation, respiratory function stable and patient connected to nasal cannula oxygen Cardiovascular status: blood pressure returned to baseline and stable Postop Assessment: no apparent nausea or vomiting Anesthetic complications: no    Last Vitals:  Vitals:   12/26/18 0806 12/26/18 0955  BP: 108/62 91/61  Pulse: 93 92  Resp: 14 16  Temp: 37 C   SpO2: 99%     Last Pain:  Vitals:   12/26/18 1000  TempSrc:   PainSc: 6                  Jackquelyn Sundberg

## 2018-12-26 NOTE — Progress Notes (Signed)
Discharge instructions reviewed with pt.  Pt also instructed on how to empty and record JP drains.  Pt verbalized understanding and had no questions.  Pt discharged in stable condition via wheelchair.  Ellen Lane

## 2018-12-26 NOTE — Discharge Summary (Signed)
Physician Discharge Summary  Patient ID: Ellen Lane MRN: [id number] DOB/AGE: [birth year] 51 y.o.  Admit date: 12/25/2018 Discharge date: 12/26/2018  Admission Diagnoses:  Discharge Diagnoses:  Active Problems:   Symptomatic mammary hypertrophy   Discharged Condition: good  Hospital Course: The patient was taken to the operating room and underwent bilateral breast reduction.  Due to her overall status and lack of a ride she was kept overnight.  She had several episodes of nausea in the morning.  This was likely related to being volume depleted.  She responded very well to hydration with fluids.  At the time of discharge she felt much better and was ready to go home.  The Foley catheter was removed.  She does in and out caths at home manually.  Consults: None  Significant Diagnostic Studies: none  Treatments: surgery  Discharge Exam: Blood pressure (!) 106/48, pulse 94, temperature 97.7 F (36.5 C), temperature source Oral, resp. rate 20, height 5\' 2"  (1.575 m), weight 95.3 kg, last menstrual period 01/08/2015, SpO2 100 %. General appearance: alert, cooperative and no distress Incision/Wound: Incisions intact and drains in place  Disposition:   Discharge Instructions    Call MD for:  difficulty breathing, headache or visual disturbances   Complete by:  As directed    Call MD for:  hives   Complete by:  As directed    Call MD for:  persistant dizziness or light-headedness   Complete by:  As directed    Call MD for:  persistant nausea and vomiting   Complete by:  As directed    Call MD for:  redness, tenderness, or signs of infection (pain, swelling, redness, odor or green/yellow discharge around incision site)   Complete by:  As directed    Call MD for:  severe uncontrolled pain   Complete by:  As directed    Call MD for:  temperature >100.4   Complete by:  As directed    Diet general   Complete by:  As directed    Discharge instructions   Complete by:  As directed    [place]   Discharge wound care:   Complete by:  As directed    Sink bath or baby wipes   Driving Restrictions   Complete by:  As directed    Not while on any pain meds.   Increase activity slowly   Complete by:  As directed    Lifting restrictions   Complete by:  As directed    No heavy lifting.     Allergies as of 12/26/2018      Reactions   Ace Inhibitors Cough   Sulfa Antibiotics Hives      Medication List    TAKE these medications   Accu-Chek Softclix Lancets lancets CHECK BLOOD SUGAR THREE TIMES DAILY E11.9   aspirin EC 81 MG tablet Take 81 mg by mouth daily.   aspirin-acetaminophen-caffeine 250-250-65 MG tablet Commonly known as:  EXCEDRIN MIGRAINE Take 2 tablets by mouth every 8 (eight) hours as needed for headache (pain).   baclofen 10 MG tablet Commonly known as:  LIORESAL Take 10 mg by mouth 3 (three) times daily.   BD Pen Needle Micro U/F 32G X 6 MM Misc Generic drug:  Insulin Pen Needle USE TO INJECT  SUBCUTANEOUSLY ONE TIME  DAILY   beta carotene 25000 UNIT capsule Take 25,000 Units by mouth daily.   bictegravir-emtricitabine-tenofovir AF 50-200-25 MG Tabs tablet Commonly known as:  Biktarvy Take 1 tablet by mouth daily.  BIOTIN PO Take 500 mcg by mouth daily.   CALCIUM CITRATE + PO Take 1 tablet by mouth 2 (two) times daily.   Chromium Picolinate 1000 MCG Tabs Take 1,000 mcg by mouth daily.   Flaxseed Oil 1000 MG Caps Take 1,000 mg by mouth daily.   folic acid 854 MCG tablet Commonly known as:  FOLVITE Take 400 mcg by mouth daily.   glucose blood test strip CHECK BLOOD SUGAR FOUR TIMES DAILY AS DIRECTED DX: E11.9   IRON PO Take 27 mg by mouth daily.   metFORMIN 500 MG tablet Commonly known as:  GLUCOPHAGE Take 500 mg by mouth 2 (two) times daily with a meal.   Misc. Devices Misc Needs 16" urinary catheters for in/out every 5 hours.    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   omeprazole 20 MG capsule Commonly known as:  PRILOSEC Take 1 capsule (20 mg total) by mouth 2 (two) times daily.   ondansetron 4 MG disintegrating tablet Commonly known as:  ZOFRAN-ODT Take 4 mg by mouth every 6 (six) hours as needed for nausea or vomiting.   oxybutynin 5 MG tablet Commonly known as:  DITROPAN Take 5 mg by mouth 3 (three) times daily.   Vitamin D (Cholecalciferol) 50 MCG (2000 UT) Caps Take 2,000 Units by mouth daily.            Discharge Care Instructions  (From admission, onward)         Start     Ordered   12/26/18 0000  Discharge wound care:    Comments:  Sink bath or baby wipes   12/26/18 1125         Follow-up Information    Tereka Thorley, Loel Lofty, DO In 1 week.   Specialty:  Plastic Surgery Contact information: [address] [place] [address] [phone]           Signed: Wallace Going 12/26/2018, 5:31 PM

## 2018-12-26 NOTE — Progress Notes (Signed)
Pt's BP is now 108/62. MD notified.  No new orders received.  Will continue to monitor.  Eliezer [place]

## 2018-12-27 ENCOUNTER — Telehealth: Payer: Self-pay | Admitting: *Deleted

## 2018-12-27 NOTE — Telephone Encounter (Addendum)
Received a call from the patient stating that she had surgery on Monday.  She said the area is draining in the bulb, but she has noticed a leakage around the tubing area. The leakage is red, and she has noticed her gown is wet.  Patient wanted to know if that was normal.  Informed the patient that it's not normal.  But I will speak with Dr. Marla Roe to see what we will need to do and give her a call back.  Patient verbalized understanding and agreed.//AB/CMA

## 2018-12-28 NOTE — Telephone Encounter (Signed)
Called the patient back on (12/27/18) to follow-up, and she stated that the fluid was going into the bulb, but she noticed that the tubing was kinked off.  So she removed the kink in the tubing and it stopped the leakage.  Patient stated that she has not noticed any more leakage.//AB/CMA

## 2018-12-29 ENCOUNTER — Telehealth: Payer: Self-pay | Admitting: Plastic Surgery

## 2018-12-29 NOTE — Telephone Encounter (Signed)
Patient called stating she is still having issues with her drain. Patient believed that there was a kink in the line, but now is having recurrent issues. Please contact and advise

## 2018-12-29 NOTE — Telephone Encounter (Signed)
Called and followed up with the patient regarding the message below.  Patient stated that she just wanted to let Dr. Marla Roe know that she is still noticing a little fluid coming out of the tubing.  She notices it when she's lying down on her (L) side.  Asked the patient if she has noticed any clotting in the tubing and did they teach her how to milk the tubing?  She stated that the fluid is draining into the bulb.  And yes she is milking the tubing.  She stated that it's not bad she just wanted Dr. Marla Roe to know.//AB/CMA

## 2019-01-02 ENCOUNTER — Encounter: Payer: Self-pay | Admitting: Plastic Surgery

## 2019-01-02 ENCOUNTER — Other Ambulatory Visit: Payer: Self-pay

## 2019-01-02 ENCOUNTER — Ambulatory Visit (INDEPENDENT_AMBULATORY_CARE_PROVIDER_SITE_OTHER): Payer: Medicare Other | Admitting: Plastic Surgery

## 2019-01-02 VITALS — BP 89/61 | HR 89 | Temp 98.0°F

## 2019-01-02 DIAGNOSIS — N62 Hypertrophy of breast: Secondary | ICD-10-CM

## 2019-01-02 NOTE — Progress Notes (Addendum)
   Subjective:    Patient ID: Ellen Lane, female    DOB: May 03, 1966, 52 y.o.   MRN: [id number]  The patient is a 53 year old female here for follow-up after undergoing bilateral breast reduction.  Her drains are in place and doing well.  Output has been minimal and as expected.  There is no sign of seroma or hematoma.  There is no sign of infection.  The incisions are healing well.  The nipple areola on both sides is alive.  She is pleased with the results.   Review of Systems  Constitutional: Negative for activity change and appetite change.  Cardiovascular: Negative.   Gastrointestinal: Negative.   Musculoskeletal: Negative.   Psychiatric/Behavioral: Negative.        Objective:   Physical Exam Vitals signs and nursing note reviewed.  Constitutional:      Appearance: Normal appearance.  HENT:     Head: Normocephalic and atraumatic.  Cardiovascular:     Rate and Rhythm: Normal rate.  Pulmonary:     Effort: Pulmonary effort is normal.  Neurological:     Mental Status: She is alert.  Psychiatric:        Mood and Affect: Mood normal.        Thought Content: Thought content normal.        Assessment & Plan:  Symptomatic mammary hypertrophy Follow-up in 1 month but call if any questions or concerns.  May shower tomorrow.  No heavy lifting for 1 month.   01/23/19 - patient called with redness.  Antibiotic called in and [facility] ordered.

## 2019-01-12 MED FILL — BIKTARVY 50-200-25 MG TABS: 50-200-25 | 90 days supply | Qty: 90 | Fill #3

## 2019-01-24 ENCOUNTER — Telehealth: Payer: Self-pay | Admitting: Plastic Surgery

## 2019-01-24 MED ORDER — CEPHALEXIN 500 MG PO CAPS: 500.0000 mg | ORAL_CAPSULE | Freq: Four times a day (QID) | ORAL | 0 refills | Status: AC

## 2019-01-24 MED FILL — CEPHALEXIN 500 MG CAPSULE: 500 | 5 days supply | Qty: 20 | Fill #0

## 2019-01-24 NOTE — Addendum Note (Signed)
Addended by: Wallace Going on: 01/24/2019 12:22 PM   Modules accepted: Orders

## 2019-01-24 NOTE — Telephone Encounter (Signed)
Patient called stating that the incision from her reduction has re-opened. Patient is wheel chair bound and states that she believes the pulling she has to do has caused it to re-open, but there is some drainage coming from the area. Patient is worried that the area could be infected. Patient denied any warmth to the area, but stated that it is red and "irritated". Patient requesting possible antibiotics.

## 2019-01-24 NOTE — Telephone Encounter (Addendum)
Received a message back from [facility] stating that Dr. Marla Roe would like for [facility] to be called and see if they could come and perform wet to dry dressing change 2x a week.  [place] and spoke with Raquel Sarna and informed her that we would like to referral a patient for wound dressing changes.  Raquel Sarna agreed and stood the patient's information.  Asked Raquel Sarna if they had a referral order form and she stated yes and she would fax the form to me.   Received the referral order form.  Called and spoke with the patient and informed her that I called [facility] and I will be faxing orders to them to come and do wet to dry dressing changes 2x a week.  Patient verbalized understanding and agreed.  Asked the patient which breast was re-opened and she stated that it was (B) the (L) breast is worse than the (R).  She said that the area where the (R) drain was removed is still doing a little draining as well.  She also mentioned about the nipple area that it looks like a piece of suture was sticking up.  Informed the patient that if it is a suture is will dissolve on its own.  Patient verbalized understanding.//AB/CMA

## 2019-01-24 NOTE — Telephone Encounter (Signed)
Called and spoke with the patient and informed her that I spoke with Bonita,RN who spoke with Dr. Marla Roe and she does want to start a antibiotic.  Asked the patient what pharmacy would she like for the prescription to be called to, and she stated [facility].  Also informed her that Dr.Dillingham also wanted to know if she has used Home Health before.  Patient stated if she has it has been a long time ago about (30) years.  But she will use them.  She stated that her son uses [facility] and we can contact them for her.  Sent pharmacy information back to Bonita,RN.//AB/CMA

## 2019-01-25 NOTE — Telephone Encounter (Signed)
[place] referral order to [facility] with patients demographics,copy of insurance [place] office visit, and medication list.  Confirmation received.  Also spoke with Meagan and she verified receiving all the information.//AB/CMA

## 2019-01-29 MED FILL — OMEPRAZOLE 40 MG CPDR: 40 | 30 days supply | Qty: 60 | Fill #0

## 2019-01-30 ENCOUNTER — Telehealth: Payer: Self-pay | Admitting: Plastic Surgery

## 2019-01-30 NOTE — Telephone Encounter (Signed)
Called patient to confirm appointment scheduled for tomorrow. Patient answered the following questions: °1.Has the patient traveled outside of the state of [place] at all within the past 6 weeks? No °2.Does the patient have a fever or cough at all? No °3.Has the patient been tested for COVID? Had a positive COVID test? No °4. Has the patient been in contact with anyone who has tested positive? No ° °

## 2019-01-30 NOTE — Telephone Encounter (Signed)
Received call from [place], Therapist, sports at [place]. She saw the patient today for a dressing change and states the left breast wound appears to be infected. She requested a call back on [phone] to provide wound measurements and to discuss further.

## 2019-01-30 NOTE — Telephone Encounter (Addendum)
Received call from [facility] with [facility] regarding the message below.  Bing Ree stated that the patient's left breast do not look good and it has gotten worse.  It's warm around the edge,bad odor, and has yellowish drainage.  She feels the left breast wound is infected.  She said the patient took the last dose of antibiotic last night.  Shrada,RN said the right breast wound may be infected as well but the left is worse than the right.  She feels the patient needs to see Dr. Marla Roe.  Informed Milinda Cave that we will give the patient a call and get her in today or tomorrow.  She asked if we have new orders after the office visit if we would please give her a call back with those orders.  Informed her that we will let her know.  Patient was called and she is scheduled for Wednesday (01/31/19 @ 2:00pm).//AB/CMA

## 2019-01-31 ENCOUNTER — Ambulatory Visit (INDEPENDENT_AMBULATORY_CARE_PROVIDER_SITE_OTHER): Payer: Medicare Other | Admitting: Plastic Surgery

## 2019-01-31 ENCOUNTER — Encounter: Payer: Self-pay | Admitting: Plastic Surgery

## 2019-01-31 ENCOUNTER — Other Ambulatory Visit: Payer: Self-pay

## 2019-01-31 VITALS — BP 100/67 | HR 79 | Temp 98.3°F

## 2019-01-31 DIAGNOSIS — N62 Hypertrophy of breast: Secondary | ICD-10-CM

## 2019-01-31 NOTE — Progress Notes (Signed)
   Subjective:    Patient ID: Ellen Lane, female    DOB: 1966/02/08, 53 y.o.   MRN: [id number]  The patient is a 53 year old female here for follow-up on her bilateral breast reduction.  She is thrilled with her size.  She had some opening of the skin in the left inframammary fold.  She has been doing wet-to-dry's at home.  The area does not appear infected in any way.  There is no sign of seroma or hematoma.  The superficial opening is approximately 2 x 6 cm in size with one area that is 3 cm.  The right side has 3 areas of superficial opening that are 5 mm in size.   Review of Systems  Skin: Positive for wound.       Objective:   Physical Exam Constitutional:      Appearance: Normal appearance.  HENT:     Head: Normocephalic and atraumatic.  Cardiovascular:     Rate and Rhythm: Normal rate.  Pulmonary:     Effort: Pulmonary effort is normal.  Neurological:     Mental Status: She is alert and oriented to person, place, and time.  Psychiatric:        Mood and Affect: Mood normal.        Behavior: Behavior normal.        Assessment & Plan:  Symptomatic mammary hypertrophy  Donated a cell was applied.  She should use KY gel to the left breast wound daily for 1 week and then switch to collagen if able.  Vaseline to the right side.

## 2019-02-01 ENCOUNTER — Telehealth: Payer: Self-pay | Admitting: *Deleted

## 2019-02-01 NOTE — Telephone Encounter (Signed)
Called and spoke with Shrada,RN with [facility] and informed her that the patient was seen in the office on yesterday.  And I was calling with orders from Dr. Marla Roe for the patient.  Per Dr. Dillingham-Apply KY gel to adaptic to the left breast wound daily for 1 week, then switch to collagen.  Informed her to leave the adaptic.  Then apply vaseline to the right breast.  Milinda Cave stated that she does not have KY gel, but she has Hydrogel and would that be okay to use.  Informed her that I spoke with Bonita,RN here and she stated that would be fine to use.  Shrada,RN verbalized understanding and agreed.//AB/CMA

## 2019-02-07 ENCOUNTER — Telehealth: Payer: Self-pay | Admitting: Plastic Surgery

## 2019-02-07 NOTE — Telephone Encounter (Addendum)
Called Shrada,RN with [facility] back regarding the message below.  Informed her that per Dr. Marla Roe yes the collagen should be used on the (L) breast and used daily, and it is fine to use xeroform gauze on the the (R) breast instead of vaseline.  She verbalized understanding and agreed.  She also stated that the patient's wound is doing better, and there is no more yellowish and green drainage.//AB/CMA

## 2019-02-07 NOTE — Telephone Encounter (Signed)
Received call from [place], Nash from [place] who has questions regarding wound care. She would like to know if collagen can be used on the left breast, and if so, how often? She would also like to know if xeroform gauze can be used on the right breast instead of vaseline.  Contact number: [phone]

## 2019-02-13 ENCOUNTER — Other Ambulatory Visit: Payer: Self-pay | Admitting: Nurse Practitioner

## 2019-02-13 DIAGNOSIS — Z1231 Encounter for screening mammogram for malignant neoplasm of breast: Secondary | ICD-10-CM

## 2019-02-15 ENCOUNTER — Encounter: Payer: Self-pay | Admitting: Plastic Surgery

## 2019-02-15 ENCOUNTER — Ambulatory Visit (INDEPENDENT_AMBULATORY_CARE_PROVIDER_SITE_OTHER): Payer: Medicare Other | Admitting: Plastic Surgery

## 2019-02-15 ENCOUNTER — Other Ambulatory Visit: Payer: Self-pay

## 2019-02-15 DIAGNOSIS — N62 Hypertrophy of breast: Secondary | ICD-10-CM

## 2019-02-15 NOTE — Progress Notes (Signed)
   Subjective:    Patient ID: Ellen Lane, female    DOB: 1966/07/06, 53 y.o.   MRN: [id number]  The patient is a 53 year old female here for follow-up on her bilateral breast reduction.  She is overall doing extremely well.  She states that home health is been helping her with the collagen dressing changes.  She has been trying to increase her protein as well.  The wound is much smaller.  There does not appear to be any infection.  It is superficial.    Review of Systems  Constitutional: Negative.   HENT: Negative.   Respiratory: Negative.  Negative for shortness of breath.   Gastrointestinal: Negative.   Skin: Positive for wound.  Psychiatric/Behavioral: Negative.        Objective:   Physical Exam Vitals signs and nursing note reviewed.  Constitutional:      Appearance: Normal appearance.  HENT:     Head: Normocephalic.  Cardiovascular:     Rate and Rhythm: Normal rate.  Neurological:     Mental Status: She is alert. Mental status is at baseline.  Psychiatric:        Mood and Affect: Mood normal.        Behavior: Behavior normal.        Assessment & Plan:  Symptomatic mammary hypertrophy  Continue with collagen dressing changes daily at home. A picture taken of the left breast and placed in the chart with the patient's permission. I would like to see her back in 1 month.

## 2019-02-16 ENCOUNTER — Ambulatory Visit: Payer: Medicare Other | Admitting: Plastic Surgery

## 2019-02-22 ENCOUNTER — Telehealth: Payer: Self-pay | Admitting: Plastic Surgery

## 2019-02-22 ENCOUNTER — Telehealth: Payer: Self-pay

## 2019-02-22 NOTE — Telephone Encounter (Signed)
Call back from [place], [place]- gave verbal orders per Dr. Marla Roe to extend home health/dressing changes 2x/week for 4 weeks RN indicated that this is for the left wound only- the other has healed completely  She is aware that pt has f/u with Dr.Dillingham on 03/19/19  [facility]

## 2019-02-22 NOTE — Telephone Encounter (Signed)
Received call from [place], [place] with [facility]. She would like to extend nursing visits to twice weekly for 4 weeks. She requested a call back at [phone].

## 2019-02-22 NOTE — Telephone Encounter (Signed)
Call back to [place], RN with [facility] health- no answer-left v/m requesting c/b  Doroteo Bradford

## 2019-03-13 ENCOUNTER — Telehealth: Payer: Self-pay | Admitting: *Deleted

## 2019-03-13 NOTE — Telephone Encounter (Signed)
Received call from [place] with [facility].  She wanted to let Dr. Marla Roe know that the wound on the patient's (L) breast is measuring (.1x.1) and it should be healed by Friday.  If healed she will discontinue Home Health care, if not she will see the patient next week for a visit.//AB/CMA

## 2019-03-14 NOTE — Telephone Encounter (Signed)
Informed Dr. Marla Roe of the message from Home Health.//AB/CMA

## 2019-03-16 ENCOUNTER — Telehealth: Payer: Self-pay | Admitting: Plastic Surgery

## 2019-03-16 NOTE — Telephone Encounter (Signed)
Called patient to confirm appointment scheduled for Monday. Patient answered the following questions: 1. To the best of your knowledge, have you been in close contact with any one with a confirmed diagnosis of COVID-19? No 2. Have you had any one or more of the following; fever, chills, cough, shortness of breath, or any flu-like symptoms? No 3. Have you been diagnosed with or have a previous diagnosis of COVID 19? No 4. I am going to go over a few other symptoms with you. Please let me know if you are experiencing any of the following: None of the below a. Ear, nose, or throat discomfort b. A sore throat c. Headache d. Muscle pain e. Diarrhea f. Loss of taste or smell

## 2019-03-19 ENCOUNTER — Ambulatory Visit: Payer: Medicare Other | Admitting: Plastic Surgery

## 2019-03-26 ENCOUNTER — Telehealth: Payer: Self-pay | Admitting: Plastic Surgery

## 2019-03-26 NOTE — Telephone Encounter (Signed)

## 2019-03-27 ENCOUNTER — Encounter: Payer: Self-pay | Admitting: Nurse Practitioner

## 2019-03-27 ENCOUNTER — Ambulatory Visit (INDEPENDENT_AMBULATORY_CARE_PROVIDER_SITE_OTHER): Payer: Medicare Other | Admitting: Nurse Practitioner

## 2019-03-27 ENCOUNTER — Other Ambulatory Visit: Payer: Self-pay

## 2019-03-27 VITALS — BP 122/84 | HR 59 | Temp 98.2°F

## 2019-03-27 DIAGNOSIS — N62 Hypertrophy of breast: Secondary | ICD-10-CM
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
## 2019-04-09 ENCOUNTER — Other Ambulatory Visit: Payer: Self-pay | Admitting: Infectious Disease

## 2019-04-16 MED FILL — BIKTARVY 50-200-25 MG TABS: 50-200-25 | 90 days supply | Qty: 90 | Fill #0

## 2019-04-25 ENCOUNTER — Ambulatory Visit
Admission: RE | Admit: 2019-04-25 | Discharge: 2019-04-25 | Disposition: A | Discharge status: Home / Self Care | Payer: Medicare Other | Source: Ambulatory Visit | Attending: Nurse Practitioner | Admitting: Nurse Practitioner

## 2019-04-25 DIAGNOSIS — Z1231 Encounter for screening mammogram for malignant neoplasm of breast: Secondary | ICD-10-CM

## 2019-05-09 MED FILL — BACLOFEN 10 MG TABS: 10 | 90 days supply | Qty: 270 | Fill #0

## 2019-05-24 MED FILL — OMEPRAZOLE 40 MG CPDR: 40 | 30 days supply | Qty: 60 | Fill #1

## 2019-07-06 MED FILL — OMEPRAZOLE 40 MG CPDR: 40 | 30 days supply | Qty: 60 | Fill #2

## 2019-07-09 MED FILL — BIKTARVY 50-200-25 MG TABS: 50-200-25 | 90 days supply | Qty: 90 | Fill #1

## 2019-07-18 MED FILL — NITROFURANTOIN MONO-MCR 100: 100 | 7 days supply | Qty: 14 | Fill #0

## 2019-07-20 ENCOUNTER — Other Ambulatory Visit: Payer: Self-pay | Admitting: Nurse Practitioner

## 2019-07-20 DIAGNOSIS — G8220 Paraplegia, unspecified: Secondary | ICD-10-CM

## 2019-08-09 MED FILL — CONTRAVE ER 8-90 MG TABLET: 8-90 | 21 days supply | Qty: 42 | Fill #0

## 2019-08-14 MED FILL — BACLOFEN 10 MG TABS: 10 | 90 days supply | Qty: 270 | Fill #0

## 2019-08-22 ENCOUNTER — Emergency Department (HOSPITAL_COMMUNITY)
Admission: EM | Admit: 2019-08-22 | Discharge: 2019-08-23 | Disposition: A | Discharge status: Other Acute Inpatient Hospital | Payer: Medicare Other | Attending: Emergency Medicine | Admitting: Emergency Medicine

## 2019-08-22 ENCOUNTER — Other Ambulatory Visit: Payer: Self-pay

## 2019-08-22 ENCOUNTER — Ambulatory Visit (INDEPENDENT_AMBULATORY_CARE_PROVIDER_SITE_OTHER): Payer: Medicare Other | Admitting: Obstetrics & Gynecology

## 2019-08-22 ENCOUNTER — Encounter: Payer: Self-pay | Admitting: Obstetrics & Gynecology

## 2019-08-22 ENCOUNTER — Emergency Department (HOSPITAL_COMMUNITY): Payer: Medicare Other

## 2019-08-22 ENCOUNTER — Encounter (HOSPITAL_COMMUNITY): Payer: Self-pay | Admitting: Emergency Medicine

## 2019-08-22 VITALS — BP 113/65 | HR 83

## 2019-08-22 DIAGNOSIS — Z01419 Encounter for gynecological examination (general) (routine) without abnormal findings: Secondary | ICD-10-CM

## 2019-08-22 DIAGNOSIS — R198 Other specified symptoms and signs involving the digestive system and abdomen: Secondary | ICD-10-CM | POA: Diagnosis not present

## 2019-08-22 DIAGNOSIS — E119 Type 2 diabetes mellitus without complications: Secondary | ICD-10-CM | POA: Insufficient documentation

## 2019-08-22 DIAGNOSIS — M25512 Pain in left shoulder: Secondary | ICD-10-CM | POA: Insufficient documentation

## 2019-08-22 DIAGNOSIS — R079 Chest pain, unspecified: Secondary | ICD-10-CM

## 2019-08-22 DIAGNOSIS — R0789 Other chest pain: Secondary | ICD-10-CM | POA: Diagnosis present

## 2019-08-22 DIAGNOSIS — Z79899 Other long term (current) drug therapy: Secondary | ICD-10-CM | POA: Diagnosis not present

## 2019-08-22 DIAGNOSIS — B20 Human immunodeficiency virus [HIV] disease: Secondary | ICD-10-CM | POA: Insufficient documentation

## 2019-08-22 DIAGNOSIS — G8220 Paraplegia, unspecified: Secondary | ICD-10-CM | POA: Insufficient documentation

## 2019-08-22 DIAGNOSIS — N951 Menopausal and female climacteric states: Secondary | ICD-10-CM

## 2019-08-22 DIAGNOSIS — Z7982 Long term (current) use of aspirin: Secondary | ICD-10-CM | POA: Diagnosis not present

## 2019-08-22 DIAGNOSIS — Z794 Long term (current) use of insulin: Secondary | ICD-10-CM | POA: Insufficient documentation

## 2019-08-22 LAB — BASIC METABOLIC PANEL
Anion gap: 12 (ref 5–15)
BUN: 7 mg/dL (ref 6–20)
CO2: 22 mmol/L (ref 22–32)
Calcium: 9.2 mg/dL (ref 8.9–10.3)
Chloride: 108 mmol/L (ref 98–111)
Creatinine, Ser: 0.75 mg/dL (ref 0.44–1.00)
GFR calc Af Amer: >60 mL/min (ref >60)
GFR calc non Af Amer: >60 mL/min (ref >60)
Glucose, Bld: 184 mg/dL — ABNORMAL HIGH (ref 70–99)
Potassium: 3.3 mmol/L — ABNORMAL LOW (ref 3.5–5.1)
Sodium: 142 mmol/L (ref 135–145)

## 2019-08-22 LAB — TROPONIN I (HIGH SENSITIVITY)
Troponin I (High Sensitivity): 2 ng/L (ref <18)
Troponin I (High Sensitivity): 2 ng/L (ref <18)

## 2019-08-22 LAB — CBC
HCT: 45.2 % (ref 36.0–46.0)
Hemoglobin: 14.7 g/dL (ref 12.0–15.0)
MCH: 27.8 pg (ref 26.0–34.0)
MCHC: 32.5 g/dL (ref 30.0–36.0)
MCV: 85.6 fL (ref 80.0–100.0)
Platelets: 176 10*3/uL (ref 150–400)
RBC: 5.28 MIL/uL — ABNORMAL HIGH (ref 3.87–5.11)
RDW: 14.5 % (ref 11.5–15.5)
WBC: 5.7 10*3/uL (ref 4.0–10.5)
nRBC: 0 % (ref 0.0–0.2)

## 2019-08-22 LAB — I-STAT BETA HCG BLOOD, ED (MC, WL, AP ONLY): I-stat hCG, quantitative: <5 m[IU]/mL (ref <5)

## 2019-08-22 MED ORDER — SODIUM CHLORIDE 0.9% FLUSH
3.0000 mL | Freq: Once | INTRAVENOUS | Status: AC
  Administered 2019-08-22: 3 mL via INTRAVENOUS

## 2019-08-22 MED ORDER — GABAPENTIN 600 MG PO TABS: 600.0000 mg | ORAL_TABLET | Freq: Every day | ORAL | 3 refills | Status: DC

## 2019-08-22 MED ORDER — HYDROMORPHONE HCL 1 MG/ML IJ SOLN
0.5000 mg | Freq: Once | INTRAMUSCULAR | Status: AC
  Administered 2019-08-22: 0.5 mg via INTRAVENOUS
  Filled 2019-08-22: qty 1

## 2019-08-22 MED FILL — GABAPENTIN 600 MG TABLET: 600 | 30 days supply | Qty: 30 | Fill #0

## 2019-08-22 NOTE — ED Notes (Signed)
Called to update vital signs no answer. 

## 2019-08-22 NOTE — ED Triage Notes (Addendum)
Patient presents to the ED by EMS with c/o sharp left chest pain, left shoulder pain with radiation to her jaw. She was attempting to use her wheelchair up the ramp at home with the pain began. She is unable to sit higher than 30 degrees with out severe pain.  EMS: bp reading on right 120/70 left arm 150/90. No hx of AAA or dissection. 100 MCG of fentanyl PTA A/o x4. Paraplegic.

## 2019-08-22 NOTE — Patient Instructions (Signed)
Preventive Care 19-51 Years Old, Female Preventive care refers to visits with your health care provider and lifestyle choices that can promote health and wellness. This includes:  A yearly physical exam. This may also be called an annual well check.  Regular dental visits and eye exams.  Immunizations.  Screening for certain conditions.  Healthy lifestyle choices, such as eating a healthy diet, getting regular exercise, not using drugs or products that contain nicotine and tobacco, and limiting alcohol use. What can I expect for my preventive care visit? Physical exam Your health care provider will check your:  Height and weight. This may be used to calculate body mass index (BMI), which tells if you are at a healthy weight.  Heart rate and blood pressure.  Skin for abnormal spots. Counseling Your health care provider may ask you questions about your:  Alcohol, tobacco, and drug use.  Emotional well-being.  Home and relationship well-being.  Sexual activity.  Eating habits.  Work and work Statistician.  Method of birth control.  Menstrual cycle.  Pregnancy history. What immunizations do I need?  Influenza (flu) vaccine  This is recommended every year. Tetanus, diphtheria, and pertussis (Tdap) vaccine  You may need a Td booster every 10 years. Varicella (chickenpox) vaccine  You may need this if you have not been vaccinated. Zoster (shingles) vaccine  You may need this after age 75. Measles, mumps, and rubella (MMR) vaccine  You may need at least one dose of MMR if you were born in 1957 or later. You may also need a second dose. Pneumococcal conjugate (PCV13) vaccine  You may need this if you have certain conditions and were not previously vaccinated. Pneumococcal polysaccharide (PPSV23) vaccine  You may need one or two doses if you smoke cigarettes or if you have certain conditions. Meningococcal conjugate (MenACWY) vaccine  You may need this if  you have certain conditions. Hepatitis A vaccine  You may need this if you have certain conditions or if you travel or work in places where you may be exposed to hepatitis A. Hepatitis B vaccine  You may need this if you have certain conditions or if you travel or work in places where you may be exposed to hepatitis B. Haemophilus influenzae type b (Hib) vaccine  You may need this if you have certain conditions. Human papillomavirus (HPV) vaccine  If recommended by your health care provider, you may need three doses over 6 months. You may receive vaccines as individual doses or as more than one vaccine together in one shot (combination vaccines). Talk with your health care provider about the risks and benefits of combination vaccines. What tests do I need? Blood tests  Lipid and cholesterol levels. These may be checked every 5 years, or more frequently if you are over 30 years old.  Hepatitis C test.  Hepatitis B test. Screening  Lung cancer screening. You may have this screening every year starting at age 44 if you have a 30-pack-year history of smoking and currently smoke or have quit within the past 15 years.  Colorectal cancer screening. All adults should have this screening starting at age 52 and continuing until age 36. Your health care provider may recommend screening at age 70 if you are at increased risk. You will have tests every 1-10 years, depending on your results and the type of screening test.  Diabetes screening. This is done by checking your blood sugar (glucose) after you have not eaten for a while (fasting). You may  done every 1-3 years.  Mammogram. This may be done every 1-2 years. Talk with your health care provider about when you should start having regular mammograms. This may depend on whether you have a family history of breast cancer.  BRCA-related cancer screening. This may be done if you have a family history of breast, ovarian, tubal, or peritoneal  cancers.  Pelvic exam and Pap test. This may be done every 3 years starting at age 21. Starting at age 30, this may be done every 5 years if you have a Pap test in combination with an HPV test. Other tests  Sexually transmitted disease (STD) testing.  Bone density scan. This is done to screen for osteoporosis. You may have this scan if you are at high risk for osteoporosis. Follow these instructions at home: Eating and drinking  Eat a diet that includes fresh fruits and vegetables, whole grains, lean protein, and low-fat dairy.  Take vitamin and mineral supplements as recommended by your health care provider.  Do not drink alcohol if: ? Your health care provider tells you not to drink. ? You are pregnant, may be pregnant, or are planning to become pregnant.  If you drink alcohol: ? Limit how much you have to 0-1 drink a day. ? Be aware of how much alcohol is in your drink. In the [place] one drink equals one 12 oz bottle of beer (355 mL), one 5 oz glass of wine (148 mL), or one 1 oz glass of hard liquor (44 mL). Lifestyle  Take daily care of your teeth and gums.  Stay active. Exercise for at least 30 minutes on 5 or more days each week.  Do not use any products that contain nicotine or tobacco, such as cigarettes, e-cigarettes, and chewing tobacco. If you need help quitting, ask your health care provider.  If you are sexually active, practice safe sex. Use a condom or other form of birth control (contraception) in order to prevent pregnancy and STIs (sexually transmitted infections).  If told by your health care provider, take low-dose aspirin daily starting at age 50. What's next?  Visit your health care provider once a year for a well check visit.  Ask your health care provider how often you should have your eyes and teeth checked.  Stay up to date on all vaccines. This information is not intended to replace advice given to you by your health care provider. Make sure you  discuss any questions you have with your health care provider. Document Released: 10/31/2015 Document Revised: 06/15/2018 Document Reviewed: 06/15/2018 Elsevier Patient Education  2020 Elsevier Inc.  
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
## 2019-08-23 ENCOUNTER — Emergency Department (HOSPITAL_COMMUNITY): Payer: Medicare Other

## 2019-08-23 DIAGNOSIS — R198 Other specified symptoms and signs involving the digestive system and abdomen: Secondary | ICD-10-CM | POA: Diagnosis not present

## 2019-08-23 MED ORDER — ONDANSETRON HCL 4 MG/2ML IJ SOLN
4.0000 mg | Freq: Once | INTRAMUSCULAR | Status: AC
  Administered 2019-08-23: 09:00:00 4 mg via INTRAVENOUS
  Filled 2019-08-23: qty 2

## 2019-08-23 MED ORDER — PIPERACILLIN-TAZOBACTAM 3.375 G IVPB
3.3750 g | Freq: Three times a day (TID) | INTRAVENOUS | Status: DC
  Administered 2019-08-23: 05:00:00 3.375 g via INTRAVENOUS
  Filled 2019-08-23: qty 50

## 2019-08-23 MED ORDER — IOHEXOL 350 MG/ML SOLN
100.0000 mL | Freq: Once | INTRAVENOUS | Status: AC | PRN
  Administered 2019-08-23: 100 mL via INTRAVENOUS

## 2019-08-23 MED ORDER — SUCRALFATE 1 GM/10ML PO SUSP
1.0000 g | Freq: Four times a day (QID) | ORAL | Status: DC
  Administered 2019-08-23: 06:00:00 1 g via ORAL
  Filled 2019-08-23 (×2): qty 10

## 2019-08-23 MED ORDER — SODIUM CHLORIDE 0.9 % IV BOLUS
1000.0000 mL | Freq: Once | INTRAVENOUS | Status: AC
  Administered 2019-08-23: 02:00:00 1000 mL via INTRAVENOUS

## 2019-08-23 MED ORDER — PANTOPRAZOLE SODIUM 40 MG IV SOLR: 40.0000 mg | Freq: Two times a day (BID) | INTRAVENOUS | Status: DC

## 2019-08-23 MED ORDER — HYDROMORPHONE HCL 1 MG/ML IJ SOLN
0.5000 mg | Freq: Once | INTRAMUSCULAR | Status: AC
  Administered 2019-08-23: 09:00:00 0.5 mg via INTRAVENOUS
  Filled 2019-08-23: qty 1
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
## 2019-08-23 NOTE — ED Notes (Signed)
Unable to waste 0.5mg  dilaudid at the time administered, wasted at this time under temporary pt profile with Jenny Reichmann, Therapist, sports.

## 2019-08-23 NOTE — ED Notes (Signed)
Report given to [place] Report given to [place]

## 2019-08-23 NOTE — Progress Notes (Signed)
Patient ID: Ellen Lane, female   DOB: 30-Jul-1966, 53 y.o.   MRN: [id number] Pending transfer per patient request to [facility] for care by bariatric surgeon.  She is comfortable with some nausea, nontender on exam.  Vitals normal.  Do not think she needs surgery right now and ok for transfer per her request

## 2019-08-23 NOTE — ED Notes (Signed)
Pt cleaned, peri care performed. All bedding replaced.
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
## 2019-08-29 MED FILL — CIPROFLOXACIN HCL 500 MG TA: 500 | 7 days supply | Qty: 14 | Fill #0

## 2019-09-07 ENCOUNTER — Telehealth: Payer: Self-pay | Admitting: Pharmacist

## 2019-09-07 NOTE — Telephone Encounter (Signed)
Ellen Lane, pharmacist from [facility], called to alert me that patient has started on sucralfate by her bariatric doctor.  Holly reached out to their clinic to alert of drug-drug interaction with Biktarvy but they would like to continue medication. Ellen Lane will advise patient to take her Biktarvy first thing in the morning under fasting conditions at least 2 hours before her first dose of sucralfate. I told her to tell the patient to call with any questions.

## 2019-09-11 MED FILL — SUCRALFATE 1 GM/10ML SUSP: 1 | 30 days supply | Qty: 1200 | Fill #0

## 2019-09-25 ENCOUNTER — Other Ambulatory Visit: Payer: Self-pay | Admitting: Infectious Disease

## 2019-09-27 MED FILL — GABAPENTIN 600 MG TABLET: 600 | 30 days supply | Qty: 30 | Fill #1

## 2019-09-27 MED FILL — BIKTARVY 50-200-25 MG TABS: 50-200-25 | 90 days supply | Qty: 90 | Fill #0

## 2019-10-01 ENCOUNTER — Ambulatory Visit: Payer: Medicare Other | Admitting: Obstetrics & Gynecology

## 2019-11-06 MED FILL — PREDNISOLONE AC 1% EYE DROP: 1 | 30 days supply | Qty: 10 | Fill #0

## 2019-11-06 MED FILL — KETOROLAC 0.4% OPHTH SOLN: 0.4 | 25 days supply | Qty: 5 | Fill #0

## 2019-11-06 MED FILL — OFLOXACIN 0.3% EYE DROPS: 0.3 | 25 days supply | Qty: 5 | Fill #0

## 2019-11-07 MED FILL — BACLOFEN 10 MG TABS: 10 | 90 days supply | Qty: 270 | Fill #1

## 2019-11-14 ENCOUNTER — Other Ambulatory Visit: Payer: Self-pay

## 2019-11-14 ENCOUNTER — Ambulatory Visit
Admission: RE | Admit: 2019-11-14 | Discharge: 2019-11-14 | Disposition: A | Discharge status: Home / Self Care | Payer: Medicare Other | Source: Ambulatory Visit | Attending: Nurse Practitioner | Admitting: Nurse Practitioner

## 2019-11-14 DIAGNOSIS — G8220 Paraplegia, unspecified: Secondary | ICD-10-CM

## 2019-11-27 MED FILL — OFLOXACIN 0.3% EYE DROPS: 0.3 | 25 days supply | Qty: 5 | Fill #1

## 2019-11-27 MED FILL — KETOROLAC 0.4% OPHTH SOLN: 0.4 | 25 days supply | Qty: 5 | Fill #1

## 2019-11-27 MED FILL — PREDNISOLONE AC 1% EYE DROP: 1 | 50 days supply | Qty: 10 | Fill #1

## 2019-12-20 MED FILL — BIKTARVY 50-200-25 MG TABS: 50-200-25 | 30 days supply | Qty: 30 | Fill #1

## 2019-12-26 MED FILL — CONTRAVE ER 8-90 MG TABLET: 8-90 | 30 days supply | Qty: 120 | Fill #0

## 2020-01-16 MED FILL — BIKTARVY 50-200-25 MG TABS: 50-200-25 | 30 days supply | Qty: 30 | Fill #2

## 2020-01-17 MED FILL — BACLOFEN 10 MG TABS: 10 | 90 days supply | Qty: 270 | Fill #0

## 2020-01-17 MED FILL — PANTOPRAZOLE SOD DR 40 MG T: 40 | 90 days supply | Qty: 90 | Fill #0

## 2020-02-13 MED FILL — BIKTARVY 50-200-25 MG TABS: 50-200-25 | 30 days supply | Qty: 30 | Fill #3

## 2020-02-19 ENCOUNTER — Other Ambulatory Visit: Payer: Self-pay | Admitting: Nurse Practitioner

## 2020-02-19 DIAGNOSIS — Z1231 Encounter for screening mammogram for malignant neoplasm of breast: Secondary | ICD-10-CM

## 2020-02-20 ENCOUNTER — Other Ambulatory Visit: Payer: Medicare Other

## 2020-02-21 MED FILL — SUCRALFATE 1 GM TABLET: 1 | 30 days supply | Qty: 120 | Fill #0

## 2020-02-21 MED FILL — PANTOPRAZOLE SOD DR 40 MG T: 40 | 30 days supply | Qty: 60 | Fill #0

## 2020-02-22 NOTE — Addendum Note (Signed)
Addended by: Dolan Amen D on: 02/22/2020 10:38 AM   Modules accepted: Orders

## 2020-02-25 ENCOUNTER — Other Ambulatory Visit: Payer: Medicare Other

## 2020-02-25 ENCOUNTER — Other Ambulatory Visit: Payer: Self-pay

## 2020-02-25 DIAGNOSIS — B20 Human immunodeficiency virus [HIV] disease: Secondary | ICD-10-CM

## 2020-02-25 DIAGNOSIS — Z79899 Other long term (current) drug therapy: Secondary | ICD-10-CM

## 2020-02-25 DIAGNOSIS — Z113 Encounter for screening for infections with a predominantly sexual mode of transmission: Secondary | ICD-10-CM

## 2020-02-25 DIAGNOSIS — N62 Hypertrophy of breast: Secondary | ICD-10-CM

## 2020-02-26 LAB — T-HELPER CELL (CD4) - (RCID CLINIC ONLY)
CD4 % Helper T Cell: 41 % (ref 33–65)
CD4 T Cell Abs: 972 /uL (ref 400–1790)

## 2020-02-27 LAB — COMPLETE METABOLIC PANEL WITH GFR
AG Ratio: 1.6 (calc) (ref 1.0–2.5)
ALT: 16 U/L (ref 6–29)
AST: 11 U/L (ref 10–35)
Albumin: 3.8 g/dL (ref 3.6–5.1)
Alkaline phosphatase (APISO): 78 U/L (ref 37–153)
BUN: 13 mg/dL (ref 7–25)
CO2: 24 mmol/L (ref 20–32)
Calcium: 9.4 mg/dL (ref 8.6–10.4)
Chloride: 107 mmol/L (ref 98–110)
Creat: 0.71 mg/dL (ref 0.50–1.05)
GFR, Est African American: 113 mL/min/{1.73_m2} (ref >=60)
GFR, Est Non African American: 98 mL/min/{1.73_m2} (ref >=60)
Globulin: 2.4 g/dL (calc) (ref 1.9–3.7)
Glucose, Bld: 84 mg/dL (ref 65–99)
Potassium: 3.8 mmol/L (ref 3.5–5.3)
Sodium: 141 mmol/L (ref 135–146)
Total Bilirubin: 0.4 mg/dL (ref 0.2–1.2)
Total Protein: 6.2 g/dL (ref 6.1–8.1)

## 2020-02-27 LAB — CBC WITH DIFFERENTIAL/PLATELET
Absolute Monocytes: 345 cells/uL (ref 200–950)
Basophils Absolute: 21 cells/uL (ref 0–200)
Basophils Relative: 0.4 %
Eosinophils Absolute: 111 cells/uL (ref 15–500)
Eosinophils Relative: 2.1 %
HCT: 38.7 % (ref 35.0–45.0)
Hemoglobin: 12.6 g/dL (ref 11.7–15.5)
Lymphs Abs: 2443 cells/uL (ref 850–3900)
MCH: 28.4 pg (ref 27.0–33.0)
MCHC: 32.6 g/dL (ref 32.0–36.0)
MCV: 87.4 fL (ref 80.0–100.0)
MPV: 12.3 fL (ref 7.5–12.5)
Monocytes Relative: 6.5 %
Neutro Abs: 2380 cells/uL (ref 1500–7800)
Neutrophils Relative %: 44.9 %
Platelets: 190 10*3/uL (ref 140–400)
RBC: 4.43 10*6/uL (ref 3.80–5.10)
RDW: 14 % (ref 11.0–15.0)
Total Lymphocyte: 46.1 %
WBC: 5.3 10*3/uL (ref 3.8–10.8)

## 2020-02-27 LAB — RPR: RPR Ser Ql: NONREACTIVE

## 2020-02-27 LAB — LIPID PANEL
Cholesterol: 137 mg/dL (ref <200)
HDL: 50 mg/dL (ref >=50)
LDL Cholesterol (Calc): 72 mg/dL (calc)
Non-HDL Cholesterol (Calc): 87 mg/dL (calc) (ref <130)
Total CHOL/HDL Ratio: 2.7 (calc) (ref <5.0)
Triglycerides: 70 mg/dL (ref <150)

## 2020-02-27 LAB — HIV-1 RNA QUANT-NO REFLEX-BLD
HIV 1 RNA Quant: <20 copies/mL — AB
HIV-1 RNA Quant, Log: <1.3 Log copies/mL — AB

## 2020-03-06 ENCOUNTER — Other Ambulatory Visit: Payer: Self-pay | Admitting: Infectious Disease

## 2020-03-12 ENCOUNTER — Other Ambulatory Visit: Payer: Self-pay

## 2020-03-12 ENCOUNTER — Other Ambulatory Visit: Payer: Self-pay | Admitting: Infectious Disease

## 2020-03-12 ENCOUNTER — Encounter: Payer: Self-pay | Admitting: Infectious Disease

## 2020-03-12 ENCOUNTER — Ambulatory Visit (INDEPENDENT_AMBULATORY_CARE_PROVIDER_SITE_OTHER): Payer: Medicare Other | Admitting: Infectious Disease

## 2020-03-12 VITALS — BP 128/83 | HR 94 | Temp 100.3°F

## 2020-03-12 DIAGNOSIS — Z113 Encounter for screening for infections with a predominantly sexual mode of transmission: Secondary | ICD-10-CM

## 2020-03-12 DIAGNOSIS — E0801 Diabetes mellitus due to underlying condition with hyperosmolarity with coma: Secondary | ICD-10-CM

## 2020-03-12 DIAGNOSIS — R109 Unspecified abdominal pain: Secondary | ICD-10-CM

## 2020-03-12 DIAGNOSIS — B20 Human immunodeficiency virus [HIV] disease: Secondary | ICD-10-CM | POA: Diagnosis not present

## 2020-03-12 DIAGNOSIS — R10A0 Flank pain, unspecified side: Secondary | ICD-10-CM

## 2020-03-12 DIAGNOSIS — Z794 Long term (current) use of insulin: Secondary | ICD-10-CM

## 2020-03-12 MED ORDER — CEPHALEXIN 500 MG PO CAPS: 500.0000 mg | ORAL_CAPSULE | Freq: Four times a day (QID) | ORAL | 0 refills | Status: DC

## 2020-03-12 MED ORDER — CEPHALEXIN 500 MG PO CAPS: 500.0000 mg | ORAL_CAPSULE | Freq: Four times a day (QID) | ORAL | 0 refills | Status: AC

## 2020-03-12 MED ORDER — BIKTARVY 50-200-25 MG PO TABS: 1.0000 | ORAL_TABLET | Freq: Every day | ORAL | 11 refills | Status: DC

## 2020-03-12 MED FILL — CEPHALEXIN 500 MG CAPSULE: 500 | 5 days supply | Qty: 20 | Fill #0
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
## 2020-03-13 ENCOUNTER — Other Ambulatory Visit: Payer: Medicare Other

## 2020-03-13 MED FILL — BIKTARVY 50-200-25 MG TABS: 50-200-25 | 30 days supply | Qty: 30 | Fill #0

## 2020-03-13 NOTE — Addendum Note (Signed)
Addended by: Dolan Amen D on: 03/13/2020 02:14 PM   Modules accepted: Orders

## 2020-03-15 LAB — URINE CULTURE
MICRO NUMBER:: 10529542
SPECIMEN QUALITY:: ADEQUATE

## 2020-03-15 LAB — URINALYSIS, ROUTINE W REFLEX MICROSCOPIC
Bilirubin Urine: NEGATIVE
Glucose, UA: NEGATIVE
Hgb urine dipstick: NEGATIVE
Hyaline Cast: NONE SEEN /LPF
Ketones, ur: NEGATIVE
Nitrite: POSITIVE — AB
Protein, ur: NEGATIVE
RBC / HPF: NONE SEEN /HPF (ref 0–2)
Specific Gravity, Urine: 1.006 (ref 1.001–1.03)
pH: 6 (ref 5.0–8.0)

## 2020-04-07 ENCOUNTER — Ambulatory Visit (INDEPENDENT_AMBULATORY_CARE_PROVIDER_SITE_OTHER): Payer: Medicare Other | Admitting: Obstetrics & Gynecology

## 2020-04-07 ENCOUNTER — Encounter: Payer: Self-pay | Admitting: Obstetrics & Gynecology

## 2020-04-07 ENCOUNTER — Other Ambulatory Visit: Payer: Self-pay

## 2020-04-07 VITALS — BP 132/74 | HR 46

## 2020-04-07 DIAGNOSIS — R61 Generalized hyperhidrosis: Secondary | ICD-10-CM | POA: Diagnosis not present

## 2020-04-07 DIAGNOSIS — N951 Menopausal and female climacteric states: Secondary | ICD-10-CM | POA: Diagnosis not present

## 2020-04-07 DIAGNOSIS — I10 Essential (primary) hypertension: Secondary | ICD-10-CM | POA: Diagnosis not present

## 2020-04-07 MED ORDER — GABAPENTIN 600 MG PO TABS: 600.0000 mg | ORAL_TABLET | Freq: Every day | ORAL | 3 refills | Status: DC

## 2020-04-07 MED FILL — GABAPENTIN 600 MG TABLET: 600 | 30 days supply | Qty: 30 | Fill #0
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
## 2020-04-14 MED FILL — BIKTARVY 50-200-25 MG TABS: 50-200-25 | 30 days supply | Qty: 30 | Fill #0

## 2020-04-16 MED FILL — BACLOFEN 10 MG TABS: 10 | 90 days supply | Qty: 270 | Fill #1

## 2020-04-25 ENCOUNTER — Ambulatory Visit
Admission: RE | Admit: 2020-04-25 | Discharge: 2020-04-25 | Disposition: A | Discharge status: Home / Self Care | Payer: Medicare Other | Source: Ambulatory Visit | Attending: Nurse Practitioner | Admitting: Nurse Practitioner

## 2020-04-25 ENCOUNTER — Other Ambulatory Visit: Payer: Self-pay

## 2020-04-25 DIAGNOSIS — Z1231 Encounter for screening mammogram for malignant neoplasm of breast: Secondary | ICD-10-CM

## 2020-04-30 ENCOUNTER — Other Ambulatory Visit: Payer: Self-pay | Admitting: Nurse Practitioner

## 2020-04-30 DIAGNOSIS — R928 Other abnormal and inconclusive findings on diagnostic imaging of breast: Secondary | ICD-10-CM

## 2020-05-08 ENCOUNTER — Ambulatory Visit
Admission: RE | Admit: 2020-05-08 | Discharge: 2020-05-08 | Disposition: A | Discharge status: Home / Self Care | Payer: Medicare Other | Source: Ambulatory Visit | Attending: Nurse Practitioner | Admitting: Nurse Practitioner

## 2020-05-08 ENCOUNTER — Other Ambulatory Visit: Payer: Self-pay | Admitting: Diagnostic Radiology

## 2020-05-08 ENCOUNTER — Other Ambulatory Visit: Payer: Self-pay | Admitting: Nurse Practitioner

## 2020-05-08 ENCOUNTER — Other Ambulatory Visit: Payer: Self-pay

## 2020-05-08 DIAGNOSIS — R928 Other abnormal and inconclusive findings on diagnostic imaging of breast: Secondary | ICD-10-CM

## 2020-05-08 DIAGNOSIS — N6320 Unspecified lump in the left breast, unspecified quadrant: Secondary | ICD-10-CM

## 2020-05-08 DIAGNOSIS — N6310 Unspecified lump in the right breast, unspecified quadrant: Secondary | ICD-10-CM

## 2020-05-12 MED FILL — BIKTARVY 50-200-25 MG TABS: 50-200-25 | 30 days supply | Qty: 30 | Fill #1

## 2020-06-05 MED FILL — BIKTARVY 50-200-25 MG TABS: 50-200-25 | 30 days supply | Qty: 30 | Fill #2

## 2020-06-06 ENCOUNTER — Other Ambulatory Visit (HOSPITAL_COMMUNITY): Payer: Self-pay | Admitting: Nurse Practitioner

## 2020-06-08 MED FILL — METFORMIN HCL 500 MG TABS: 500 | 90 days supply | Qty: 180 | Fill #0

## 2020-06-08 MED FILL — OXYBUTYNIN CHLORIDE 5 MG TA: 5 | 90 days supply | Qty: 270 | Fill #0

## 2020-07-03 MED FILL — BIKTARVY 50-200-25 MG TABS: 50-200-25 | 30 days supply | Qty: 30 | Fill #3

## 2020-07-15 ENCOUNTER — Other Ambulatory Visit (HOSPITAL_COMMUNITY): Payer: Self-pay | Admitting: Nurse Practitioner

## 2020-07-15 MED FILL — BACLOFEN 10 MG TABS: 10 | 90 days supply | Qty: 270 | Fill #0

## 2020-07-31 MED FILL — BIKTARVY 50-200-25 MG TABS: 50-200-25 | 30 days supply | Qty: 30 | Fill #4

## 2020-08-26 MED FILL — BIKTARVY 50-200-25 MG TABS: 50-200-25 | 30 days supply | Qty: 30 | Fill #5

## 2020-09-18 MED FILL — BIKTARVY 50-200-25 MG TABS: 50-200-25 | 30 days supply | Qty: 30 | Fill #6

## 2020-10-17 MED FILL — BIKTARVY 50-200-25 MG TABS: 50-200-25 | 30 days supply | Qty: 30 | Fill #7

## 2020-10-17 MED FILL — METFORMIN HCL 500 MG TABS: 500 | 90 days supply | Qty: 180 | Fill #1

## 2020-10-17 MED FILL — OXYBUTYNIN CHLORIDE 5 MG TA: 5 | 90 days supply | Qty: 270 | Fill #1

## 2020-10-17 MED FILL — BACLOFEN 10 MG TABS: 10 | 90 days supply | Qty: 270 | Fill #1

## 2020-11-10 ENCOUNTER — Inpatient Hospital Stay: Admission: RE | Admit: 2020-11-10 | Payer: Medicare Other | Source: Ambulatory Visit

## 2020-11-10 ENCOUNTER — Other Ambulatory Visit: Payer: Medicare Other

## 2020-11-12 ENCOUNTER — Other Ambulatory Visit: Payer: Self-pay | Admitting: Infectious Disease

## 2020-11-12 MED FILL — BIKTARVY 50-200-25 MG TABS: 50-200-25 | 30 days supply | Qty: 30 | Fill #8

## 2020-11-13 ENCOUNTER — Other Ambulatory Visit: Payer: Self-pay | Admitting: Nurse Practitioner

## 2020-11-13 ENCOUNTER — Other Ambulatory Visit: Payer: Self-pay

## 2020-11-13 ENCOUNTER — Ambulatory Visit
Admission: RE | Admit: 2020-11-13 | Discharge: 2020-11-13 | Disposition: A | Discharge status: Home / Self Care | Payer: Medicare Other | Source: Ambulatory Visit | Attending: Nurse Practitioner | Admitting: Nurse Practitioner

## 2020-11-13 DIAGNOSIS — N6320 Unspecified lump in the left breast, unspecified quadrant: Secondary | ICD-10-CM

## 2020-11-13 DIAGNOSIS — N6310 Unspecified lump in the right breast, unspecified quadrant: Secondary | ICD-10-CM

## 2020-12-15 MED FILL — BIKTARVY 50-200-25 MG TABS: 50-200-25 | 30 days supply | Qty: 30 | Fill #9

## 2020-12-16 ENCOUNTER — Other Ambulatory Visit (HOSPITAL_COMMUNITY): Payer: Self-pay | Admitting: Nurse Practitioner

## 2020-12-17 MED FILL — ONETOUCH VERIO FLEX SYSTEM: W/DEVICE | 30 days supply | Qty: 1 | Fill #0

## 2020-12-17 MED FILL — ONETOUCH VERIO TEST STRIP: 50 days supply | Qty: 100 | Fill #0

## 2020-12-17 MED FILL — OZEMPIC 0.25 OR 0.5 MG/DOSE: 2 | 28 days supply | Qty: 2 | Fill #0

## 2020-12-17 MED FILL — ONETOUCH DELICA PLUS LANCET: 50 days supply | Qty: 100 | Fill #0

## 2020-12-25 ENCOUNTER — Encounter (HOSPITAL_COMMUNITY): Payer: Self-pay

## 2020-12-25 ENCOUNTER — Other Ambulatory Visit: Payer: Self-pay

## 2020-12-25 ENCOUNTER — Other Ambulatory Visit (HOSPITAL_COMMUNITY): Payer: Self-pay | Admitting: Emergency Medicine

## 2020-12-25 ENCOUNTER — Ambulatory Visit (HOSPITAL_COMMUNITY)
Admission: EM | Admit: 2020-12-25 | Discharge: 2020-12-25 | Disposition: A | Discharge status: Home / Self Care | Payer: Medicare Other | Attending: Emergency Medicine | Admitting: Emergency Medicine

## 2020-12-25 DIAGNOSIS — L0231 Cutaneous abscess of buttock: Secondary | ICD-10-CM | POA: Diagnosis not present

## 2020-12-25 MED ORDER — DOXYCYCLINE HYCLATE 100 MG PO CAPS
100.0000 mg | ORAL_CAPSULE | Freq: Two times a day (BID) | ORAL | 0 refills | Status: DC
Start: 2020-12-25 — End: 2020-12-25

## 2020-12-25 MED FILL — DOXYCYCLINE HYCLATE 100 MG: 100 | 5 days supply | Qty: 10 | Fill #0

## 2020-12-25 NOTE — ED Notes (Signed)
Transferred patient from room 7 to room 10.  The exam table/bed is more easily accessible by patient.  Patient will be transferred from wheelchair to exam table and then back to wheelchair after examination.

## 2020-12-25 NOTE — ED Triage Notes (Signed)
Pt in with c/o abscess on left buttock that she notice on Tuesday. Also c/o chills  Pt has been taking tylenol for relief

## 2020-12-25 NOTE — Discharge Instructions (Addendum)
Take one pill twice a day for 5 days  Warm compresses 10 minute intervals four times a day  Ensure buttocks is cushioned while in sitting and lying positions, rotate pressure off of buttocks throughout day  Follow up if increased discomfort, swelling, fever, chills,
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
## 2020-12-31 ENCOUNTER — Other Ambulatory Visit (HOSPITAL_COMMUNITY): Payer: Self-pay | Admitting: Nurse Practitioner

## 2021-01-07 MED FILL — BACLOFEN 10 MG TABS: 10 | 90 days supply | Qty: 270 | Fill #0

## 2021-01-07 MED FILL — BIKTARVY 50-200-25 MG TABS: 50-200-25 | 30 days supply | Qty: 30 | Fill #10

## 2021-01-13 ENCOUNTER — Other Ambulatory Visit (HOSPITAL_COMMUNITY): Payer: Self-pay

## 2021-01-15 ENCOUNTER — Other Ambulatory Visit (HOSPITAL_COMMUNITY): Payer: Self-pay | Admitting: Nurse Practitioner

## 2021-01-15 MED FILL — OZEMPIC (1 MG/DOSE) 4 MG/3M: 4 | 84 days supply | Qty: 3 | Fill #0

## 2021-01-15 MED FILL — ONDANSETRON ODT 4 MG TABLET: 4 | 7 days supply | Qty: 20 | Fill #0

## 2021-01-19 ENCOUNTER — Other Ambulatory Visit (HOSPITAL_COMMUNITY): Payer: Self-pay

## 2021-02-03 ENCOUNTER — Other Ambulatory Visit (HOSPITAL_COMMUNITY): Payer: Self-pay

## 2021-02-03 MED FILL — Semaglutide Soln Pen-inj 1 MG/DOSE (4 MG/3ML): SUBCUTANEOUS | 28 days supply | Qty: 3 | Fill #0 | Status: AC

## 2021-02-03 MED FILL — Bictegravir-Emtricitabine-Tenofovir AF Tab 50-200-25 MG: ORAL | 30 days supply | Qty: 30 | Fill #0 | Status: AC

## 2021-02-05 ENCOUNTER — Other Ambulatory Visit (HOSPITAL_COMMUNITY): Payer: Self-pay

## 2021-02-26 ENCOUNTER — Other Ambulatory Visit: Payer: Medicare Other

## 2021-02-26 ENCOUNTER — Other Ambulatory Visit: Payer: Self-pay

## 2021-02-26 DIAGNOSIS — Z113 Encounter for screening for infections with a predominantly sexual mode of transmission: Secondary | ICD-10-CM

## 2021-02-26 DIAGNOSIS — E0801 Diabetes mellitus due to underlying condition with hyperosmolarity with coma: Secondary | ICD-10-CM

## 2021-02-26 DIAGNOSIS — B20 Human immunodeficiency virus [HIV] disease: Secondary | ICD-10-CM

## 2021-02-26 DIAGNOSIS — Z794 Long term (current) use of insulin: Secondary | ICD-10-CM

## 2021-02-26 DIAGNOSIS — R109 Unspecified abdominal pain: Secondary | ICD-10-CM

## 2021-02-27 LAB — T-HELPER CELL (CD4) - (RCID CLINIC ONLY)
CD4 % Helper T Cell: 42 % (ref 33–65)
CD4 T Cell Abs: 900 /uL (ref 400–1790)

## 2021-03-01 LAB — COMPLETE METABOLIC PANEL WITH GFR
AG Ratio: 1.6 (calc) (ref 1.0–2.5)
ALT: 18 U/L (ref 6–29)
AST: 14 U/L (ref 10–35)
Albumin: 3.9 g/dL (ref 3.6–5.1)
Alkaline phosphatase (APISO): 79 U/L (ref 37–153)
BUN: 10 mg/dL (ref 7–25)
CO2: 27 mmol/L (ref 20–32)
Calcium: 9.5 mg/dL (ref 8.6–10.4)
Chloride: 104 mmol/L (ref 98–110)
Creat: 0.54 mg/dL (ref 0.50–1.05)
GFR, Est African American: 125 mL/min/{1.73_m2} (ref >=60)
GFR, Est Non African American: 108 mL/min/{1.73_m2} (ref >=60)
Globulin: 2.5 g/dL (calc) (ref 1.9–3.7)
Glucose, Bld: 78 mg/dL (ref 65–99)
Potassium: 3.6 mmol/L (ref 3.5–5.3)
Sodium: 139 mmol/L (ref 135–146)
Total Bilirubin: 0.4 mg/dL (ref 0.2–1.2)
Total Protein: 6.4 g/dL (ref 6.1–8.1)

## 2021-03-01 LAB — CBC WITH DIFFERENTIAL/PLATELET
Absolute Monocytes: 202 cells/uL (ref 200–950)
Basophils Absolute: 29 cells/uL (ref 0–200)
Basophils Relative: 0.7 %
Eosinophils Absolute: 218 cells/uL (ref 15–500)
Eosinophils Relative: 5.2 %
HCT: 38.4 % (ref 35.0–45.0)
Hemoglobin: 12.3 g/dL (ref 11.7–15.5)
Lymphs Abs: 2184 cells/uL (ref 850–3900)
MCH: 28.1 pg (ref 27.0–33.0)
MCHC: 32 g/dL (ref 32.0–36.0)
MCV: 87.7 fL (ref 80.0–100.0)
MPV: 12.1 fL (ref 7.5–12.5)
Monocytes Relative: 4.8 %
Neutro Abs: 1567 cells/uL (ref 1500–7800)
Neutrophils Relative %: 37.3 %
Platelets: 183 10*3/uL (ref 140–400)
RBC: 4.38 10*6/uL (ref 3.80–5.10)
RDW: 14.2 % (ref 11.0–15.0)
Total Lymphocyte: 52 %
WBC: 4.2 10*3/uL (ref 3.8–10.8)

## 2021-03-01 LAB — LIPID PANEL
Cholesterol: 138 mg/dL (ref <200)
HDL: 44 mg/dL — ABNORMAL LOW (ref >=50)
LDL Cholesterol (Calc): 77 mg/dL (calc)
Non-HDL Cholesterol (Calc): 94 mg/dL (calc) (ref <130)
Total CHOL/HDL Ratio: 3.1 (calc) (ref <5.0)
Triglycerides: 90 mg/dL (ref <150)

## 2021-03-01 LAB — RPR: RPR Ser Ql: NONREACTIVE

## 2021-03-01 LAB — HIV-1 RNA QUANT-NO REFLEX-BLD
HIV 1 RNA Quant: NOT DETECTED Copies/mL
HIV-1 RNA Quant, Log: NOT DETECTED Log cps/mL

## 2021-03-03 ENCOUNTER — Other Ambulatory Visit (HOSPITAL_COMMUNITY): Payer: Self-pay

## 2021-03-03 ENCOUNTER — Other Ambulatory Visit: Payer: Self-pay | Admitting: Infectious Disease

## 2021-03-03 MED ORDER — BIKTARVY 50-200-25 MG PO TABS
1.0000 | ORAL_TABLET | Freq: Every day | ORAL | 0 refills | Status: DC
  Filled 2021-03-03: qty 30, 30d supply, fill #0

## 2021-03-03 MED FILL — Semaglutide Soln Pen-inj 1 MG/DOSE (4 MG/3ML): SUBCUTANEOUS | 28 days supply | Qty: 3 | Fill #1 | Status: AC

## 2021-03-04 ENCOUNTER — Other Ambulatory Visit (HOSPITAL_COMMUNITY): Payer: Self-pay

## 2021-03-04 MED ORDER — CIPROFLOXACIN HCL 500 MG PO TABS
500.0000 mg | ORAL_TABLET | Freq: Two times a day (BID) | ORAL | 0 refills | Status: DC
  Filled 2021-03-04: qty 10, 5d supply, fill #0

## 2021-03-05 ENCOUNTER — Other Ambulatory Visit (HOSPITAL_COMMUNITY): Payer: Self-pay

## 2021-03-13 ENCOUNTER — Encounter: Payer: Medicare Other | Admitting: Infectious Disease

## 2021-03-24 ENCOUNTER — Encounter: Payer: Medicare Other | Admitting: Infectious Disease

## 2021-03-27 ENCOUNTER — Other Ambulatory Visit (HOSPITAL_COMMUNITY): Payer: Self-pay

## 2021-03-27 MED FILL — Baclofen Tab 10 MG: ORAL | 90 days supply | Qty: 270 | Fill #0 | Status: AC

## 2021-03-30 ENCOUNTER — Other Ambulatory Visit (HOSPITAL_COMMUNITY): Payer: Self-pay

## 2021-03-31 ENCOUNTER — Other Ambulatory Visit (HOSPITAL_COMMUNITY): Payer: Self-pay

## 2021-04-02 ENCOUNTER — Other Ambulatory Visit (HOSPITAL_COMMUNITY): Payer: Self-pay

## 2021-04-02 ENCOUNTER — Other Ambulatory Visit: Payer: Self-pay | Admitting: Infectious Disease

## 2021-04-02 MED ORDER — BIKTARVY 50-200-25 MG PO TABS
1.0000 | ORAL_TABLET | Freq: Every day | ORAL | 0 refills | Status: DC
  Filled 2021-04-02: qty 30, 30d supply, fill #0

## 2021-04-06 ENCOUNTER — Other Ambulatory Visit: Payer: Self-pay

## 2021-04-06 ENCOUNTER — Other Ambulatory Visit (HOSPITAL_COMMUNITY): Payer: Self-pay

## 2021-04-06 ENCOUNTER — Encounter (HOSPITAL_COMMUNITY): Payer: Self-pay

## 2021-04-06 ENCOUNTER — Ambulatory Visit (HOSPITAL_COMMUNITY)
Admission: EM | Admit: 2021-04-06 | Discharge: 2021-04-06 | Disposition: A | Discharge status: Home / Self Care | Payer: Medicare Other | Attending: Emergency Medicine | Admitting: Emergency Medicine

## 2021-04-06 DIAGNOSIS — L89302 Pressure ulcer of unspecified buttock, stage 2: Secondary | ICD-10-CM

## 2021-04-06 MED ORDER — DOXYCYCLINE HYCLATE 100 MG PO CAPS
100.0000 mg | ORAL_CAPSULE | Freq: Two times a day (BID) | ORAL | 0 refills | Status: DC
  Filled 2021-04-06: qty 14, 7d supply, fill #0

## 2021-04-06 NOTE — Discharge Instructions (Addendum)
Wound care referral placed, they should call you to schedule an appointment if you do not hear anything after 1 week please reach out to the practice, information listed below  Take antibiotic twice a day for the 7 days, this is to prevent infection, area does not look infected today but you mentioned yellow drainage  Reach out to Primary doctor for management of getting new wheelchair seat ordered.  Please change positions or shift weight in seat to remove pressure off of butt

## 2021-04-06 NOTE — ED Triage Notes (Signed)
Pt presents with abscess area around tailbone area X 1 week with some yellow & bloody drainage.
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
## 2021-04-08 ENCOUNTER — Other Ambulatory Visit (HOSPITAL_COMMUNITY): Payer: Self-pay

## 2021-04-08 MED ORDER — OXYCODONE-ACETAMINOPHEN 5-325 MG PO TABS
ORAL_TABLET | ORAL | 0 refills | Status: DC
  Filled 2021-04-08: qty 21, 7d supply, fill #0

## 2021-04-16 ENCOUNTER — Other Ambulatory Visit (HOSPITAL_COMMUNITY): Payer: Self-pay

## 2021-04-16 MED FILL — Semaglutide Soln Pen-inj 1 MG/DOSE (4 MG/3ML): SUBCUTANEOUS | 28 days supply | Qty: 3 | Fill #2 | Status: AC

## 2021-04-17 ENCOUNTER — Ambulatory Visit: Payer: Medicare Other | Admitting: Obstetrics & Gynecology

## 2021-04-27 ENCOUNTER — Encounter (HOSPITAL_BASED_OUTPATIENT_CLINIC_OR_DEPARTMENT_OTHER): Payer: Medicare Other | Attending: Internal Medicine | Admitting: Internal Medicine

## 2021-04-27 ENCOUNTER — Other Ambulatory Visit: Payer: Self-pay | Admitting: Infectious Disease

## 2021-04-27 ENCOUNTER — Other Ambulatory Visit (HOSPITAL_COMMUNITY): Payer: Self-pay

## 2021-04-27 ENCOUNTER — Other Ambulatory Visit: Payer: Self-pay

## 2021-04-27 DIAGNOSIS — Z794 Long term (current) use of insulin: Secondary | ICD-10-CM | POA: Insufficient documentation

## 2021-04-27 DIAGNOSIS — B20 Human immunodeficiency virus [HIV] disease: Secondary | ICD-10-CM | POA: Diagnosis not present

## 2021-04-27 DIAGNOSIS — E119 Type 2 diabetes mellitus without complications: Secondary | ICD-10-CM

## 2021-04-27 DIAGNOSIS — L89153 Pressure ulcer of sacral region, stage 3: Secondary | ICD-10-CM

## 2021-04-27 DIAGNOSIS — G8220 Paraplegia, unspecified: Secondary | ICD-10-CM
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
## 2021-04-27 NOTE — Telephone Encounter (Signed)
Has appt on 7/14

## 2021-04-27 NOTE — Telephone Encounter (Signed)
Appt 7/14.

## 2021-04-29 ENCOUNTER — Other Ambulatory Visit (HOSPITAL_COMMUNITY): Payer: Self-pay

## 2021-04-29 ENCOUNTER — Other Ambulatory Visit: Payer: Self-pay | Admitting: Infectious Disease

## 2021-04-29 MED ORDER — PANTOPRAZOLE SODIUM 40 MG PO TBEC
DELAYED_RELEASE_TABLET | ORAL | 2 refills | Status: DC
  Filled 2021-04-29: qty 60, 30d supply, fill #0

## 2021-04-30 ENCOUNTER — Ambulatory Visit (INDEPENDENT_AMBULATORY_CARE_PROVIDER_SITE_OTHER): Payer: Medicare Other

## 2021-04-30 ENCOUNTER — Encounter: Payer: Self-pay | Admitting: Infectious Disease

## 2021-04-30 ENCOUNTER — Ambulatory Visit (INDEPENDENT_AMBULATORY_CARE_PROVIDER_SITE_OTHER): Payer: Medicare Other | Admitting: Infectious Disease

## 2021-04-30 ENCOUNTER — Other Ambulatory Visit: Payer: Self-pay

## 2021-04-30 VITALS — BP 108/73 | HR 82 | Temp 98.2°F

## 2021-04-30 DIAGNOSIS — N319 Neuromuscular dysfunction of bladder, unspecified: Secondary | ICD-10-CM | POA: Diagnosis not present

## 2021-04-30 DIAGNOSIS — I10 Essential (primary) hypertension: Secondary | ICD-10-CM

## 2021-04-30 DIAGNOSIS — Z23 Encounter for immunization: Secondary | ICD-10-CM | POA: Diagnosis not present

## 2021-04-30 DIAGNOSIS — L89152 Pressure ulcer of sacral region, stage 2: Secondary | ICD-10-CM

## 2021-04-30 DIAGNOSIS — M62838 Other muscle spasm: Secondary | ICD-10-CM

## 2021-04-30 DIAGNOSIS — E782 Mixed hyperlipidemia: Secondary | ICD-10-CM

## 2021-04-30 DIAGNOSIS — B20 Human immunodeficiency virus [HIV] disease: Secondary | ICD-10-CM | POA: Diagnosis not present

## 2021-04-30 DIAGNOSIS — F4321 Adjustment disorder with depressed mood: Secondary | ICD-10-CM

## 2021-04-30 DIAGNOSIS — L8990 Pressure ulcer of unspecified site, unspecified stage: Secondary | ICD-10-CM

## 2021-04-30 HISTORY — DX: Pressure ulcer of unspecified site, unspecified stage: L89.90

## 2021-04-30 HISTORY — DX: Adjustment disorder with depressed mood: F43.21

## 2021-04-30 NOTE — Progress Notes (Signed)
   Covid-19 Vaccination Clinic  Name:  Jerusha Reising    MRN: [id number] DOB: Feb 13, 1966  04/30/2021  Ms. Jenison was observed post Covid-19 immunization for 15 minutes without incident. She was provided with Vaccine Information Sheet and instruction to access the V-Safe system.   Ms. Panozzo was instructed to call 911 with any severe reactions post vaccine: Difficulty breathing  Swelling of face and throat  A fast heartbeat  A bad rash all over body  Dizziness and weakness   Immunizations Administered     Name Date Dose VIS Date Route   PFIZER Comrnaty(Gray TOP) Covid-19 Vaccine 04/30/2021  2:45 PM 0.3 mL 09/25/2020 Intramuscular   Manufacturer: [employer]   Lot: [id number]   NDC: [place] Tobias Avitabile, CMA
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
## 2021-05-01 ENCOUNTER — Other Ambulatory Visit (HOSPITAL_COMMUNITY): Payer: Self-pay

## 2021-05-01 ENCOUNTER — Other Ambulatory Visit: Payer: Self-pay | Admitting: Infectious Disease

## 2021-05-01 MED ORDER — BIKTARVY 50-200-25 MG PO TABS
1.0000 | ORAL_TABLET | Freq: Every day | ORAL | 0 refills | Status: DC
  Filled 2021-05-01: qty 30, 30d supply, fill #0

## 2021-05-04 ENCOUNTER — Encounter (HOSPITAL_BASED_OUTPATIENT_CLINIC_OR_DEPARTMENT_OTHER): Payer: Medicare Other | Admitting: Internal Medicine

## 2021-05-04 ENCOUNTER — Other Ambulatory Visit (HOSPITAL_COMMUNITY): Payer: Self-pay

## 2021-05-04 ENCOUNTER — Other Ambulatory Visit: Payer: Self-pay

## 2021-05-04 DIAGNOSIS — L89153 Pressure ulcer of sacral region, stage 3: Secondary | ICD-10-CM | POA: Diagnosis not present

## 2021-05-04 DIAGNOSIS — G8220 Paraplegia, unspecified: Secondary | ICD-10-CM

## 2021-05-04 DIAGNOSIS — E119 Type 2 diabetes mellitus without complications: Secondary | ICD-10-CM

## 2021-05-04 DIAGNOSIS — B20 Human immunodeficiency virus [HIV] disease: Secondary | ICD-10-CM

## 2021-05-04 NOTE — Progress Notes (Signed)
ASMARA, BACKS ([id number]) Visit Report for 05/04/2021 Arrival Information Details Patient Name: Date of Service: ALLANNAH, KEMPEN 05/04/2021 9:45 A M Medical Record Number: [id number] Patient Account Number: [id number] Date of Birth/Sex: Treating RN: [birth year] (55 y.o. [name] Primary Care Tyshauna Finkbiner: Vicenta Aly Other Clinician: Referring Ariyon Mittleman: Treating Asjia Berrios/Extender: Clydene Pugh in Treatment: 1 Visit Information History Since Last Visit Added or deleted any medications: No Patient Arrived: Wheel Chair Any new allergies or adverse reactions: No Arrival Time: 09:30 Had a fall or experienced change in No Accompanied By: alone activities of daily living that may affect Transfer Assistance: Transfer Board risk of falls: Patient Identification Verified: Yes Signs or symptoms of abuse/neglect since last visito No Secondary Verification Process Completed: Yes Hospitalized since last visit: No Patient Requires Transmission-Based Precautions: No Implantable device outside of the clinic excluding No Patient Has Alerts: No cellular tissue based products placed in the center since last visit: Has Dressing in Place as Prescribed: Yes Pain Present Now: No Electronic Signature(s) Signed: 05/04/2021 5:04:52 PM By: Levan Hurst RN, BSN Entered By: Levan Hurst on 05/04/2021 09:58:20 -------------------------------------------------------------------------------- Encounter Discharge Information Details Patient Name: Date of Service: [name] 05/04/2021 9:45 A M Medical Record Number: [id number] Patient Account Number: [id number] Date of Birth/Sex: Treating RN: [birth year] (55 y.o. Elam Dutch Primary Care Clarisa Danser: Vicenta Aly Other Clinician: Referring Donnelle Rubey: Treating Macon Lesesne/Extender: Clydene Pugh in Treatment: 1 Encounter Discharge Information Items Post Procedure Vitals Discharge  Condition: Stable Temperature (F): 98.4 Ambulatory Status: Wheelchair Pulse (bpm): 85 Discharge Destination: Home Respiratory Rate (breaths/min): 18 Transportation: Other Blood Pressure (mmHg): 91/65 Accompanied By: self Schedule Follow-up Appointment: Yes Clinical Summary of Care: Patient Declined Notes SCAT Electronic Signature(s) Signed: 05/04/2021 5:28:31 PM By: Baruch Gouty RN, BSN Entered By: Baruch Gouty on 05/04/2021 10:32:11 -------------------------------------------------------------------------------- Multi Wound Chart Details Patient Name: Date of Service: [name] 05/04/2021 9:45 A M Medical Record Number: [id number] Patient Account Number: [id number] Date of Birth/Sex: Treating RN: 11/11/1965 (55 y.o. [name] Primary Care Khadijah Mastrianni: Vicenta Aly Other Clinician: Referring Permelia Bamba: Treating Fredrico Beedle/Extender: Clydene Pugh in Treatment: 1 Vital Signs Height(in): 62 Pulse(bpm): 85 Weight(lbs): 170 Blood Pressure(mmHg): 91/65 Body Mass Index(BMI): 31 Temperature(F): 98.4 Respiratory Rate(breaths/min): 17 Photos: [1:No Photos Sacrum] [N/A:N/A N/A] Wound Location: [1:Pressure Injury] [N/A:N/A] Wounding Event: [1:Pressure Ulcer] [N/A:N/A] Primary Etiology: [1:Cataracts, Anemia, Sleep Apnea,] [N/A:N/A] Comorbid History: [1:Hypertension, Type II Diabetes, Paraplegia 04/17/2021] [N/A:N/A] Date Acquired: [1:1] [N/A:N/A] Weeks of Treatment: [1:Open] [N/A:N/A] Wound Status: [1:0.8x0.3x0.1] [N/A:N/A] Measurements L x W x D (cm) [1:0.188] [N/A:N/A] A (cm) : rea [1:0.019] [N/A:N/A] Volume (cm) : [1:46.70%] [N/A:N/A] % Reduction in A [1:rea: 73.20%] [N/A:N/A] % Reduction in Volume: [1:Category/Stage III] [N/A:N/A] Classification: [1:Medium] [N/A:N/A] Exudate A mount: [1:Serosanguineous] [N/A:N/A] Exudate Type: [1:red, brown] [N/A:N/A] Exudate Color: [1:Distinct, outline attached] [N/A:N/A] Wound Margin:  [1:Large (67-100%)] [N/A:N/A] Granulation A mount: [1:Red, Pink] [N/A:N/A] Granulation Quality: [1:None Present (0%)] [N/A:N/A] Necrotic A mount: [1:Fat Layer (Subcutaneous Tissue): Yes N/A] Exposed Structures: [1:Fascia: No Tendon: No Muscle: No Joint: No Bone: No Small (1-33%)] [N/A:N/A] Epithelialization: [1:Chemical/Enzymatic/Mechanical] [N/A:N/A] Debridement: Pre-procedure Verification/Time Out 10:20 [N/A:N/A] Taken: [1:N/A] [N/A:N/A] Instrument: [1:None] [N/A:N/A] Bleeding: [1:Procedure was tolerated well] [N/A:N/A] Debridement Treatment Response: [1:0.8x0.3x0.1] [N/A:N/A] Post Debridement Measurements L x W x D (cm) [1:0.019] [N/A:N/A] Post Debridement Volume: (cm) [1:Category/Stage III] [N/A:N/A] Post Debridement Stage: [1:Debridement] [N/A:N/A] Treatment Notes Wound #1 (Sacrum) Cleanser Soap and Water Discharge Instruction: May shower and wash wound with dial antibacterial soap and water prior  to dressing change. Peri-Wound Care Skin Prep Discharge Instruction: Use skin prep as directed Topical Primary Dressing Hydrofera Blue Ready Foam, 2.5 x2.5 in Discharge Instruction: Apply to wound bed as instructed Santyl Ointment Discharge Instruction: Apply nickel thick amount to wound bed under Hydrofera, apply only in clinic. Secondary Dressing Zetuvit Plus Silicone Border Dressing 4x4 (in/in) Discharge Instruction: Apply silicone border over primary dressing as directed. Secured With Compression Wrap Compression Stockings Environmental education officer) Signed: 05/04/2021 5:04:52 PM By: Levan Hurst RN, BSN Signed: 05/04/2021 5:31:06 PM By: Kalman Shan DO Entered By: Kalman Shan on 05/04/2021 12:05:12 -------------------------------------------------------------------------------- Multi-Disciplinary Care Plan Details Patient Name: Date of Service: [name] 05/04/2021 9:45 A M Medical Record Number: [id number] Patient Account Number:  [id number] Date of Birth/Sex: Treating RN: March 24, 1966 (55 y.o. [name] Primary Care Bron Snellings: Vicenta Aly Other Clinician: Referring Charlisha Market: Treating Nitara Szczerba/Extender: Clydene Pugh in Treatment: 1 Active Inactive Pressure Nursing Diagnoses: Potential for impaired tissue integrity related to pressure, friction, moisture, and shear Goals: Patient/caregiver will verbalize understanding of pressure ulcer management Date Initiated: 04/27/2021 Target Resolution Date: 05/25/2021 Goal Status: Active Interventions: Assess: immobility, friction, shearing, incontinence upon admission and as needed Assess offloading mechanisms upon admission and as needed Assess potential for pressure ulcer upon admission and as needed Notes: Wound/Skin Impairment Nursing Diagnoses: Impaired tissue integrity Goals: Patient/caregiver will verbalize understanding of skin care regimen Date Initiated: 04/27/2021 Target Resolution Date: 05/25/2021 Goal Status: Active Ulcer/skin breakdown will have a volume reduction of 30% by week 4 Date Initiated: 04/27/2021 Target Resolution Date: 05/25/2021 Goal Status: Active Interventions: Assess patient/caregiver ability to obtain necessary supplies Assess patient/caregiver ability to perform ulcer/skin care regimen upon admission and as needed Assess ulceration(s) every visit Provide education on ulcer and skin care Treatment Activities: Skin care regimen initiated : 04/27/2021 Topical wound management initiated : 04/27/2021 Notes: Electronic Signature(s) Signed: 05/04/2021 5:04:52 PM By: Levan Hurst RN, BSN Entered By: Levan Hurst on 05/04/2021 13:35:54 -------------------------------------------------------------------------------- Pain Assessment Details Patient Name: Date of Service: [name] 05/04/2021 9:45 A M Medical Record Number: [id number] Patient Account Number: [id number] Date of Birth/Sex: Treating  RN: 1966/01/26 (55 y.o. [name] Primary Care Charnele Semple: Vicenta Aly Other Clinician: Referring Lakya Schrupp: Treating Londan Coplen/Extender: Clydene Pugh in Treatment: 1 Active Problems Location of Pain Severity and Description of Pain Patient Has Paino No Site Locations Pain Management and Medication Current Pain Management: Electronic Signature(s) Signed: 05/04/2021 5:04:52 PM By: Levan Hurst RN, BSN Entered By: Levan Hurst on 05/04/2021 09:58:45 -------------------------------------------------------------------------------- Patient/Caregiver Education Details Patient Name: Date of Service: Marko Stai 7/18/2022andnbsp9:45 [place] Record Number: [id number] Patient Account Number: [id number] Date of Birth/Gender: Treating RN: [birth year] (55 y.o. [name] Primary Care Physician: Vicenta Aly Other Clinician: Referring Physician: Treating Physician/Extender: Clydene Pugh in Treatment: 1 Education Assessment Education Provided To: Patient Education Topics Provided Wound/Skin Impairment: Methods: Explain/Verbal Responses: State content correctly Electronic Signature(s) Signed: 05/04/2021 5:04:52 PM By: Levan Hurst RN, BSN Entered By: Levan Hurst on 05/04/2021 13:36:04 -------------------------------------------------------------------------------- Wound Assessment Details Patient Name: Date of Service: [name] 05/04/2021 9:45 A M Medical Record Number: [id number] Patient Account Number: [id number] Date of Birth/Sex: Treating RN: 1966-04-20 (55 y.o. [name] Primary Care Kejuan Bekker: Vicenta Aly Other Clinician: Referring Maegan Buller: Treating Dontrail Blackwell/Extender: Clydene Pugh in Treatment: 1 Wound Status Wound Number: 1 Primary Pressure Ulcer Etiology: Wound Location: Sacrum Wound Status: Open Wounding Event: Pressure  Injury Comorbid Cataracts, Anemia, Sleep Apnea, Hypertension,  Type II Date Acquired: 04/17/2021 History: Diabetes, Paraplegia Weeks Of Treatment: 1 Clustered Wound: No Wound Measurements Length: (cm) 0.8 Width: (cm) 0.3 Depth: (cm) 0.1 Area: (cm) 0.188 Volume: (cm) 0.019 % Reduction in Area: 46.7% % Reduction in Volume: 73.2% Epithelialization: Small (1-33%) Tunneling: No Undermining: No Wound Description Classification: Category/Stage III Wound Margin: Distinct, outline attached Exudate Amount: Medium Exudate Type: Serosanguineous Exudate Color: red, brown Foul Odor After Cleansing: No Slough/Fibrino No Wound Bed Granulation Amount: Large (67-100%) Exposed Structure Granulation Quality: Red, Pink Fascia Exposed: No Necrotic Amount: None Present (0%) Fat Layer (Subcutaneous Tissue) Exposed: Yes Tendon Exposed: No Muscle Exposed: No Joint Exposed: No Bone Exposed: No Treatment Notes Wound #1 (Sacrum) Cleanser Soap and Water Discharge Instruction: May shower and wash wound with dial antibacterial soap and water prior to dressing change. Peri-Wound Care Skin Prep Discharge Instruction: Use skin prep as directed Topical Primary Dressing Hydrofera Blue Ready Foam, 2.5 x2.5 in Discharge Instruction: Apply to wound bed as instructed Santyl Ointment Discharge Instruction: Apply nickel thick amount to wound bed under Hydrofera, apply only in clinic. Secondary Dressing Zetuvit Plus Silicone Border Dressing 4x4 (in/in) Discharge Instruction: Apply silicone border over primary dressing as directed. Secured With Compression Wrap Compression Stockings Environmental education officer) Signed: 05/04/2021 5:04:52 PM By: Levan Hurst RN, BSN Entered By: Levan Hurst on 05/04/2021 10:00:50 -------------------------------------------------------------------------------- Vitals Details Patient Name: Date of Service: [name] 05/04/2021 9:45 A M Medical Record  Number: [id number] Patient Account Number: [id number] Date of Birth/Sex: Treating RN: [birth year] (55 y.o. [name] Primary Care Deb Loudin: Vicenta Aly Other Clinician: Referring Twisha Vanpelt: Treating Kyndell Zeiser/Extender: Clydene Pugh in Treatment: 1 Vital Signs Time Taken: 09:30 Temperature (F): 98.4 Height (in): 62 Pulse (bpm): 85 Weight (lbs): 170 Respiratory Rate (breaths/min): 17 Body Mass Index (BMI): 31.1 Blood Pressure (mmHg): 91/65 Reference Range: 80 - 120 mg / dl Electronic Signature(s) Signed: 05/04/2021 5:04:52 PM By: Levan Hurst RN, BSN Entered By: Levan Hurst on 05/04/2021 09:58:40
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
## 2021-05-11 ENCOUNTER — Encounter (HOSPITAL_BASED_OUTPATIENT_CLINIC_OR_DEPARTMENT_OTHER): Payer: Medicare Other | Admitting: Internal Medicine

## 2021-05-11 ENCOUNTER — Other Ambulatory Visit: Payer: Self-pay

## 2021-05-11 DIAGNOSIS — E119 Type 2 diabetes mellitus without complications: Secondary | ICD-10-CM | POA: Diagnosis not present

## 2021-05-11 DIAGNOSIS — L89153 Pressure ulcer of sacral region, stage 3: Secondary | ICD-10-CM

## 2021-05-11 DIAGNOSIS — G8220 Paraplegia, unspecified: Secondary | ICD-10-CM | POA: Diagnosis not present

## 2021-05-11 DIAGNOSIS — B20 Human immunodeficiency virus [HIV] disease: Secondary | ICD-10-CM | POA: Diagnosis not present

## 2021-05-11 NOTE — Progress Notes (Signed)
CAM, MOSCHETTO ([id number]) Visit Report for 05/11/2021 Arrival Information Details Patient Name: Date of Service: Ellen Lane, Ellen Lane 05/11/2021 9:15 A M Medical Record Number: [id number] Patient Account Number: [id number] Date of Birth/Sex: Treating RN: 1966/02/18 (55 y.o. Tonita Phoenix, Lauren Primary Care Silver Achey: Vicenta Aly Other Clinician: Referring Kallie Depolo: Treating Tavon Corriher/Extender: Clydene Pugh in Treatment: 2 Visit Information History Since Last Visit Added or deleted any medications: No Patient Arrived: Wheel Chair Any new allergies or adverse reactions: No Arrival Time: 09:30 Had a fall or experienced change in No Accompanied By: Self activities of daily living that may affect Transfer Assistance: Transfer Board risk of falls: Patient Identification Verified: Yes Signs or symptoms of abuse/neglect since last visito No Secondary Verification Process Completed: Yes Hospitalized since last visit: No Patient Requires Transmission-Based Precautions: No Implantable device outside of the clinic excluding No Patient Has Alerts: No cellular tissue based products placed in the center since last visit: Has Dressing in Place as Prescribed: Yes Pain Present Now: No Electronic Signature(s) Signed: 05/11/2021 3:23:29 PM By: Rhae Hammock RN Entered By: Rhae Hammock on 05/11/2021 09:30:27 -------------------------------------------------------------------------------- Clinic Level of Care Assessment Details Patient Name: Date of Service: Ellen Lane, Ellen Lane 05/11/2021 9:15 A M Medical Record Number: [id number] Patient Account Number: [id number] Date of Birth/Sex: Treating RN: 17-Jan-1966 (55 y.o. [name] Primary Care Margorie Renner: Vicenta Aly Other Clinician: Referring Khing Belcher: Treating Eleen Litz/Extender: Clydene Pugh in Treatment: 2 Clinic Level of Care Assessment Items TOOL 4 Quantity Score X- 1  0 Use when only an EandM is performed on FOLLOW-UP visit ASSESSMENTS - Nursing Assessment / Reassessment X- 1 10 Reassessment of Co-morbidities (includes updates in patient status) X- 1 5 Reassessment of Adherence to Treatment Plan ASSESSMENTS - Wound and Skin A ssessment / Reassessment X - Simple Wound Assessment / Reassessment - one wound 1 5 '[]'$  - 0 Complex Wound Assessment / Reassessment - multiple wounds '[]'$  - 0 Dermatologic / Skin Assessment (not related to wound area) ASSESSMENTS - Focused Assessment '[]'$  - 0 Circumferential Edema Measurements - multi extremities '[]'$  - 0 Nutritional Assessment / Counseling / Intervention '[]'$  - 0 Lower Extremity Assessment (monofilament, tuning fork, pulses) '[]'$  - 0 Peripheral Arterial Disease Assessment (using hand held doppler) ASSESSMENTS - Ostomy and/or Continence Assessment and Care '[]'$  - 0 Incontinence Assessment and Management '[]'$  - 0 Ostomy Care Assessment and Management (repouching, etc.) PROCESS - Coordination of Care X - Simple Patient / Family Education for ongoing care 1 15 '[]'$  - 0 Complex (extensive) Patient / Family Education for ongoing care '[]'$  - 0 Staff obtains Programmer, systems, Records, T Results / Process Orders est '[]'$  - 0 Staff telephones HHA, Nursing Homes / Clarify orders / etc '[]'$  - 0 Routine Transfer to another Facility (non-emergent condition) '[]'$  - 0 Routine Hospital Admission (non-emergent condition) '[]'$  - 0 New Admissions / Biomedical engineer / Ordering NPWT Apligraf, etc. , '[]'$  - 0 Emergency Hospital Admission (emergent condition) '[]'$  - 0 Simple Discharge Coordination '[]'$  - 0 Complex (extensive) Discharge Coordination PROCESS - Special Needs '[]'$  - 0 Pediatric / Minor Patient Management '[]'$  - 0 Isolation Patient Management '[]'$  - 0 Hearing / Language / Visual special needs '[]'$  - 0 Assessment of Community assistance (transportation, D/C planning, etc.) '[]'$  - 0 Additional assistance / Altered mentation '[]'$  -  0 Support Surface(s) Assessment (bed, cushion, seat, etc.) INTERVENTIONS - Wound Cleansing / Measurement X - Simple Wound Cleansing - one wound 1 5 '[]'$  - 0 Complex Wound Cleansing - multiple  wounds X- 1 5 Wound Imaging (photographs - any number of wounds) '[]'$  - 0 Wound Tracing (instead of photographs) X- 1 5 Simple Wound Measurement - one wound '[]'$  - 0 Complex Wound Measurement - multiple wounds INTERVENTIONS - Wound Dressings '[]'$  - 0 Small Wound Dressing one or multiple wounds X- 1 15 Medium Wound Dressing one or multiple wounds '[]'$  - 0 Large Wound Dressing one or multiple wounds '[]'$  - 0 Application of Medications - topical '[]'$  - 0 Application of Medications - injection INTERVENTIONS - Miscellaneous '[]'$  - 0 External ear exam '[]'$  - 0 Specimen Collection (cultures, biopsies, blood, body fluids, etc.) '[]'$  - 0 Specimen(s) / Culture(s) sent or taken to Lab for analysis '[]'$  - 0 Patient Transfer (multiple staff / Civil Service fast streamer / Similar devices) '[]'$  - 0 Simple Staple / Suture removal (25 or less) '[]'$  - 0 Complex Staple / Suture removal (26 or more) '[]'$  - 0 Hypo / Hyperglycemic Management (close monitor of Blood Glucose) '[]'$  - 0 Ankle / Brachial Index (ABI) - do not check if billed separately X- 1 5 Vital Signs Has the patient been seen at the hospital within the last three years: Yes Total Score: 70 Level Of Care: New/Established - Level 2 Electronic Signature(s) Signed: 05/11/2021 3:40:56 PM By: Lorrin Jackson Entered By: Lorrin Jackson on 05/11/2021 10:31:19 -------------------------------------------------------------------------------- Encounter Discharge Information Details Patient Name: Date of Service: Ellen Stai. 05/11/2021 9:15 A M Medical Record Number: [id number] Patient Account Number: [id number] Date of Birth/Sex: Treating RN: [birth year] (55 y.o. [name] Primary Care Antonin Meininger: Vicenta Aly Other Clinician: Referring Chayne Baumgart: Treating Lucelia Lacey/Extender:  Clydene Pugh in Treatment: 2 Encounter Discharge Information Items Discharge Condition: Stable Ambulatory Status: Wheelchair Discharge Destination: Home Transportation: Private Auto Schedule Follow-up Appointment: Yes Clinical Summary of Care: Provided on 05/11/2021 Form Type Recipient Paper Patient Patient Electronic Signature(s) Signed: 05/11/2021 3:40:56 PM By: Lorrin Jackson Entered By: Lorrin Jackson on 05/11/2021 10:32:15 -------------------------------------------------------------------------------- Lower Extremity Assessment Details Patient Name: Date of Service: Ellen Lane, Ellen Lane 05/11/2021 9:15 A M Medical Record Number: [id number] Patient Account Number: [id number] Date of Birth/Sex: Treating RN: Aug 13, 1966 (55 y.o. Tonita Phoenix, Lauren Primary Care Anaiya Wisinski: Vicenta Aly Other Clinician: Referring Caroly Purewal: Treating Areliz Rothman/Extender: Clydene Pugh in Treatment: 2 Electronic Signature(s) Signed: 05/11/2021 3:23:29 PM By: Rhae Hammock RN Entered By: Rhae Hammock on 05/11/2021 09:31:54 -------------------------------------------------------------------------------- Multi Wound Chart Details Patient Name: Date of Service: Ellen Stai. 05/11/2021 9:15 A M Medical Record Number: [id number] Patient Account Number: [id number] Date of Birth/Sex: Treating RN: Feb 25, 1966 (55 y.o. [name] Primary Care Nithila Sumners: Vicenta Aly Other Clinician: Referring Janequa Kipnis: Treating Makya Phillis/Extender: Clydene Pugh in Treatment: 2 Vital Signs Height(in): 62 Pulse(bpm): 23 Weight(lbs): 170 Blood Pressure(mmHg): 106/58 Body Mass Index(BMI): 31 Temperature(F): 98.4 Respiratory Rate(breaths/min): 17 Photos: [N/A:N/A] Sacrum N/A N/A Wound Location: Pressure Injury N/A N/A Wounding Event: Pressure Ulcer N/A N/A Primary Etiology: Cataracts, Anemia, Sleep Apnea, N/A  N/A Comorbid History: Hypertension, Type II Diabetes, Paraplegia 04/17/2021 N/A N/A Date Acquired: 2 N/A N/A Weeks of Treatment: Open N/A N/A Wound Status: 0.1x0.1x0.1 N/A N/A Measurements L x W x D (cm) 0.008 N/A N/A A (cm) : rea 0.001 N/A N/A Volume (cm) : 97.70% N/A N/A % Reduction in A rea: 98.60% N/A N/A % Reduction in Volume: Category/Stage III N/A N/A Classification: Medium N/A N/A Exudate A mount: Serosanguineous N/A N/A Exudate Type: red, brown N/A N/A Exudate Color: Distinct, outline attached N/A N/A Wound Margin: Large (67-100%) N/A N/A  Granulation A mount: Red, Pink N/A N/A Granulation Quality: None Present (0%) N/A N/A Necrotic A mount: Fat Layer (Subcutaneous Tissue): Yes N/A N/A Exposed Structures: Fascia: No Tendon: No Muscle: No Joint: No Bone: No Medium (34-66%) N/A N/A Epithelialization: Treatment Notes Wound #1 (Sacrum) Cleanser Soap and Water Discharge Instruction: May shower and wash wound with dial antibacterial soap and water prior to dressing change. Peri-Wound Care Skin Prep Discharge Instruction: Use skin prep as directed Topical Primary Dressing Hydrofera Blue Ready Foam, 2.5 x2.5 in Discharge Instruction: Apply to wound bed as instructed Santyl Ointment Discharge Instruction: Apply nickel thick amount to wound bed under Hydrofera, apply only in clinic. Secondary Dressing Zetuvit Plus Silicone Border Dressing 4x4 (in/in) Discharge Instruction: Apply silicone border over primary dressing as directed. Secured With Compression Wrap Compression Stockings Add-Ons Electronic Signature(s) Signed: 05/11/2021 12:21:54 PM By: Kalman Shan DO Signed: 05/11/2021 3:40:56 PM By: Lorrin Jackson Entered By: Kalman Shan on 05/11/2021 12:17:50 -------------------------------------------------------------------------------- Multi-Disciplinary Care Plan Details Patient Name: Date of Service: Ellen Stai. 05/11/2021 9:15 A  M Medical Record Number: [id number] Patient Account Number: [id number] Date of Birth/Sex: Treating RN: 1966/05/05 (55 y.o. [name] Primary Care Izzy Doubek: Vicenta Aly Other Clinician: Referring Almadelia Looman: Treating Lessly Stigler/Extender: Clydene Pugh in Treatment: 2 Active Inactive Pressure Nursing Diagnoses: Potential for impaired tissue integrity related to pressure, friction, moisture, and shear Goals: Patient/caregiver will verbalize understanding of pressure ulcer management Date Initiated: 04/27/2021 Target Resolution Date: 05/25/2021 Goal Status: Active Interventions: Assess: immobility, friction, shearing, incontinence upon admission and as needed Assess offloading mechanisms upon admission and as needed Assess potential for pressure ulcer upon admission and as needed Notes: Wound/Skin Impairment Nursing Diagnoses: Impaired tissue integrity Goals: Patient/caregiver will verbalize understanding of skin care regimen Date Initiated: 04/27/2021 Target Resolution Date: 05/25/2021 Goal Status: Active Ulcer/skin breakdown will have a volume reduction of 30% by week 4 Date Initiated: 04/27/2021 Target Resolution Date: 05/25/2021 Goal Status: Active Interventions: Assess patient/caregiver ability to obtain necessary supplies Assess patient/caregiver ability to perform ulcer/skin care regimen upon admission and as needed Assess ulceration(s) every visit Provide education on ulcer and skin care Treatment Activities: Skin care regimen initiated : 04/27/2021 Topical wound management initiated : 04/27/2021 Notes: Electronic Signature(s) Signed: 05/11/2021 3:40:56 PM By: Lorrin Jackson Entered By: Lorrin Jackson on 05/11/2021 10:13:15 -------------------------------------------------------------------------------- Pain Assessment Details Patient Name: Date of Service: Ellen Lane, Ellen Lane 05/11/2021 9:15 A M Medical Record Number: [id number] Patient  Account Number: [id number] Date of Birth/Sex: Treating RN: June 07, 1966 (55 y.o. Tonita Phoenix, Lauren Primary Care Omer Monter: Vicenta Aly Other Clinician: Referring Braedon Sjogren: Treating Aliyana Dlugosz/Extender: Clydene Pugh in Treatment: 2 Active Problems Location of Pain Severity and Description of Pain Patient Has Paino No Site Locations Pain Management and Medication Current Pain Management: Electronic Signature(s) Signed: 05/11/2021 3:23:29 PM By: Rhae Hammock RN Entered By: Rhae Hammock on 05/11/2021 09:31:44 -------------------------------------------------------------------------------- Patient/Caregiver Education Details Patient Name: Date of Service: Ellen Stai 7/25/2022andnbsp9:15 [place] Record Number: [id number] Patient Account Number: [id number] Date of Birth/Gender: Treating RN: 09-20-1966 (55 y.o. [name] Primary Care Physician: Vicenta Aly Other Clinician: Referring Physician: Treating Physician/Extender: Clydene Pugh in Treatment: 2 Education Assessment Education Provided To: Patient Education Topics Provided Pressure: Methods: Explain/Verbal, Printed Responses: State content correctly Wound/Skin Impairment: Methods: Explain/Verbal, Printed Responses: State content correctly Electronic Signature(s) Signed: 05/11/2021 3:40:56 PM By: Lorrin Jackson Entered By: Lorrin Jackson on 05/11/2021 10:13:42 -------------------------------------------------------------------------------- Wound Assessment Details Patient Name: Date of Service: Ellen Stai. 05/11/2021 9:15  A M Medical Record Number: [id number] Patient Account Number: [id number] Date of Birth/Sex: Treating RN: June 01, 1966 (55 y.o. Tonita Phoenix, Lauren Primary Care Loreen Bankson: Vicenta Aly Other Clinician: Referring Marye Eagen: Treating Tazia Illescas/Extender: Clydene Pugh in Treatment:  2 Wound Status Wound Number: 1 Primary Pressure Ulcer Etiology: Wound Location: Sacrum Wound Status: Open Wounding Event: Pressure Injury Comorbid Cataracts, Anemia, Sleep Apnea, Hypertension, Type II Date Acquired: 04/17/2021 History: Diabetes, Paraplegia Weeks Of Treatment: 2 Clustered Wound: No Photos Wound Measurements Length: (cm) 0.1 Width: (cm) 0.1 Depth: (cm) 0.1 Area: (cm) 0.008 Volume: (cm) 0.001 % Reduction in Area: 97.7% % Reduction in Volume: 98.6% Epithelialization: Medium (34-66%) Tunneling: No Undermining: No Wound Description Classification: Category/Stage III Wound Margin: Distinct, outline attached Exudate Amount: Medium Exudate Type: Serosanguineous Exudate Color: red, brown Foul Odor After Cleansing: No Slough/Fibrino No Wound Bed Granulation Amount: Large (67-100%) Exposed Structure Granulation Quality: Red, Pink Fascia Exposed: No Necrotic Amount: None Present (0%) Fat Layer (Subcutaneous Tissue) Exposed: Yes Tendon Exposed: No Muscle Exposed: No Joint Exposed: No Bone Exposed: No Treatment Notes Wound #1 (Sacrum) Cleanser Soap and Water Discharge Instruction: May shower and wash wound with dial antibacterial soap and water prior to dressing change. Peri-Wound Care Skin Prep Discharge Instruction: Use skin prep as directed Topical Primary Dressing Hydrofera Blue Ready Foam, 2.5 x2.5 in Discharge Instruction: Apply to wound bed as instructed Santyl Ointment Discharge Instruction: Apply nickel thick amount to wound bed under Hydrofera, apply only in clinic. Secondary Dressing Zetuvit Plus Silicone Border Dressing 4x4 (in/in) Discharge Instruction: Apply silicone border over primary dressing as directed. Secured With Compression Wrap Compression Stockings Environmental education officer) Signed: 05/11/2021 3:23:29 PM By: Rhae Hammock RN Entered By: Rhae Hammock on 05/11/2021  09:35:33 -------------------------------------------------------------------------------- Vitals Details Patient Name: Date of Service: Ellen Stai. 05/11/2021 9:15 A M Medical Record Number: [id number] Patient Account Number: [id number] Date of Birth/Sex: Treating RN: [birth year] (55 y.o. Tonita Phoenix, Lauren Primary Care Maniah Nading: Vicenta Aly Other Clinician: Referring Isam Unrein: Treating Kyung Muto/Extender: Clydene Pugh in Treatment: 2 Vital Signs Time Taken: 09:30 Temperature (F): 98.4 Height (in): 62 Pulse (bpm): 66 Weight (lbs): 170 Respiratory Rate (breaths/min): 17 Body Mass Index (BMI): 31.1 Blood Pressure (mmHg): 106/58 Reference Range: 80 - 120 mg / dl Electronic Signature(s) Signed: 05/11/2021 3:23:29 PM By: Rhae Hammock RN Entered By: Rhae Hammock on 05/11/2021 09:31:09
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
## 2021-05-13 ENCOUNTER — Other Ambulatory Visit (HOSPITAL_COMMUNITY): Payer: Self-pay

## 2021-05-15 ENCOUNTER — Ambulatory Visit
Admission: RE | Admit: 2021-05-15 | Discharge: 2021-05-15 | Disposition: A | Discharge status: Home / Self Care | Payer: Medicare Other | Source: Ambulatory Visit | Attending: Nurse Practitioner | Admitting: Nurse Practitioner

## 2021-05-15 ENCOUNTER — Other Ambulatory Visit (HOSPITAL_COMMUNITY): Payer: Self-pay

## 2021-05-15 ENCOUNTER — Other Ambulatory Visit: Payer: Self-pay

## 2021-05-15 DIAGNOSIS — N6310 Unspecified lump in the right breast, unspecified quadrant: Secondary | ICD-10-CM

## 2021-05-18 ENCOUNTER — Encounter (HOSPITAL_BASED_OUTPATIENT_CLINIC_OR_DEPARTMENT_OTHER): Payer: Medicare Other | Attending: Internal Medicine | Admitting: Internal Medicine

## 2021-05-18 ENCOUNTER — Other Ambulatory Visit (HOSPITAL_COMMUNITY): Payer: Self-pay

## 2021-05-18 ENCOUNTER — Other Ambulatory Visit: Payer: Self-pay

## 2021-05-18 DIAGNOSIS — G4730 Sleep apnea, unspecified: Secondary | ICD-10-CM | POA: Insufficient documentation

## 2021-05-18 DIAGNOSIS — Z21 Asymptomatic human immunodeficiency virus [HIV] infection status: Secondary | ICD-10-CM | POA: Insufficient documentation

## 2021-05-18 DIAGNOSIS — Z98891 History of uterine scar from previous surgery: Secondary | ICD-10-CM | POA: Diagnosis not present

## 2021-05-18 DIAGNOSIS — Z9884 Bariatric surgery status: Secondary | ICD-10-CM | POA: Diagnosis not present

## 2021-05-18 DIAGNOSIS — L89153 Pressure ulcer of sacral region, stage 3: Secondary | ICD-10-CM | POA: Diagnosis not present

## 2021-05-18 DIAGNOSIS — I10 Essential (primary) hypertension: Secondary | ICD-10-CM | POA: Diagnosis not present

## 2021-05-18 DIAGNOSIS — E785 Hyperlipidemia, unspecified: Secondary | ICD-10-CM | POA: Insufficient documentation

## 2021-05-18 DIAGNOSIS — G8220 Paraplegia, unspecified: Secondary | ICD-10-CM | POA: Diagnosis not present

## 2021-05-18 DIAGNOSIS — Z9049 Acquired absence of other specified parts of digestive tract: Secondary | ICD-10-CM | POA: Diagnosis not present

## 2021-05-18 DIAGNOSIS — E11622 Type 2 diabetes mellitus with other skin ulcer: Secondary | ICD-10-CM | POA: Insufficient documentation

## 2021-05-18 DIAGNOSIS — E1151 Type 2 diabetes mellitus with diabetic peripheral angiopathy without gangrene: Secondary | ICD-10-CM | POA: Diagnosis not present
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
## 2021-05-18 NOTE — Progress Notes (Signed)
KEMA, REDING (PK:Lane) Visit Report for 05/18/2021 Arrival Information Details Patient Name: Date of Service: Ellen Lane, Ellen Lane 05/18/2021 9:45 A M Medical Record Number: [id number] Patient Account Number: [id number] Date of Birth/Sex: Treating RN: 1965/12/18 (55 y.o. Ellen Lane, Ellen Lane Primary Care Ellen Lane: Ellen Lane Other Clinician: Referring Ellen Lane: Treating Ellen Lane/Extender: Ellen Lane in Treatment: 3 Visit Information History Since Last Visit Added or deleted any medications: No Patient Arrived: Wheel Chair Any new allergies or adverse reactions: No Arrival Time: 10:30 Had a fall or experienced change in No Accompanied By: alone activities of daily living that may affect Transfer Assistance: Transfer Board risk of falls: Patient Requires Transmission-Based Precautions: No Signs or symptoms of abuse/neglect since last visito No Patient Has Alerts: No Hospitalized since last visit: No Implantable device outside of the clinic excluding No cellular tissue based products placed in the center since last visit: Has Dressing in Place as Prescribed: Yes Pain Present Now: No Electronic Signature(s) Signed: 05/18/2021 5:51:46 PM By: Ellen Hurst RN, BSN Entered By: Ellen Lane on 05/18/2021 10:30:39 -------------------------------------------------------------------------------- Multi Wound Chart Details Patient Name: Date of Service: Ellen Stai. 05/18/2021 9:45 A M Medical Record Number: [id number] Patient Account Number: [id number] Date of Birth/Sex: Treating RN: 15-Apr-1966 (55 y.o. Ellen Lane Primary Care Ellen Lane: Ellen Lane Other Clinician: Referring Ellen Lane: Treating Ellen Lane/Extender: Ellen Lane in Treatment: 3 Vital Signs Height(in): 62 Pulse(bpm): 69 Weight(lbs): 170 Blood Pressure(mmHg): 128/82 Body Mass Index(BMI): 31 Temperature(F): 98.4 Respiratory Rate(breaths/min):  16 Photos: [1:Sacrum] [N/A:N/A N/A] Wound Location: [1:Pressure Injury] [N/A:N/A] Wounding Event: [1:Pressure Ulcer] [N/A:N/A] Primary Etiology: [1:Cataracts, Anemia, Sleep Apnea,] [N/A:N/A] Comorbid History: [1:Hypertension, Type II Diabetes, Paraplegia 04/17/2021] [N/A:N/A] Date Acquired: [1:3] [N/A:N/A] Weeks of Treatment: [1:Open] [N/A:N/A] Wound Status: [1:0.1x0.1x0.1] [N/A:N/A] Measurements L x W x D (cm) [1:0.008] [N/A:N/A] A (cm) : rea [1:0.001] [N/A:N/A] Volume (cm) : [1:97.70%] [N/A:N/A] % Reduction in A [1:rea: 98.60%] [N/A:N/A] % Reduction in Volume: [1:Category/Stage III] [N/A:N/A] Classification: [1:Medium] [N/A:N/A] Exudate A mount: [1:Serosanguineous] [N/A:N/A] Exudate Type: [1:red, brown] [N/A:N/A] Exudate Color: [1:Distinct, outline attached] [N/A:N/A] Wound Margin: [1:Large (67-100%)] [N/A:N/A] Granulation A mount: [1:Pink, Pale] [N/A:N/A] Granulation Quality: [1:None Present (0%)] [N/A:N/A] Necrotic A mount: [1:Fat Layer (Subcutaneous Tissue): Yes N/A] Exposed Structures: [1:Fascia: No Tendon: No Muscle: No Joint: No Bone: No Large (67-100%)] [N/A:N/A] Epithelialization: [1:Chemical/Enzymatic/Mechanical] [N/A:N/A] Debridement: Pre-procedure Verification/Time Out 10:35 [N/A:N/A] Taken: [1:N/A] [N/A:N/A] Instrument: [1:None] [N/A:N/A] Bleeding: [1:Procedure was tolerated well] [N/A:N/A] Debridement Treatment Response: [1:0.1x0.1x0.1] [N/A:N/A] Post Debridement Measurements L x W x D (cm) [1:0.001] [N/A:N/A] Post Debridement Volume: (cm) [1:Category/Stage III] [N/A:N/A] Post Debridement Stage: [1:Debridement] [N/A:N/A] Treatment Notes Electronic Signature(s) Signed: 05/18/2021 11:10:50 AM By: Ellen Shan DO Signed: 05/18/2021 5:51:46 PM By: Ellen Hurst RN, BSN Entered By: Ellen Lane on 05/18/2021 11:08:14 -------------------------------------------------------------------------------- Multi-Disciplinary Care Plan Details Patient Name: Date of  Service: Ellen Stai. 05/18/2021 9:45 A M Medical Record Number: [id number] Patient Account Number: [id number] Date of Birth/Sex: Treating RN: 17-Apr-1966 (55 y.o. Ellen Lane Primary Care Ellen Lane: Ellen Lane Other Clinician: Referring Ellen Lane: Treating Ellen Lane: Ellen Lane in Treatment: 3 Active Inactive Pressure Nursing Diagnoses: Potential for impaired tissue integrity related to pressure, friction, moisture, and shear Goals: Patient/caregiver will verbalize understanding of pressure ulcer management Date Initiated: 04/27/2021 Target Resolution Date: 05/25/2021 Goal Status: Active Interventions: Assess: immobility, friction, shearing, incontinence upon admission and as needed Assess offloading mechanisms upon admission and as needed Assess potential for pressure ulcer upon admission and as needed Notes: Wound/Skin Impairment Nursing Diagnoses: Impaired  tissue integrity Goals: Patient/caregiver will verbalize understanding of skin care regimen Date Initiated: 04/27/2021 Target Resolution Date: 05/25/2021 Goal Status: Active Ulcer/skin breakdown will have a volume reduction of 30% by week 4 Date Initiated: 04/27/2021 Target Resolution Date: 05/25/2021 Goal Status: Active Interventions: Assess patient/caregiver ability to obtain necessary supplies Assess patient/caregiver ability to perform ulcer/skin care regimen upon admission and as needed Assess ulceration(s) every visit Provide education on ulcer and skin care Treatment Activities: Skin care regimen initiated : 04/27/2021 Topical wound management initiated : 04/27/2021 Notes: Electronic Signature(s) Signed: 05/18/2021 5:51:46 PM By: Ellen Hurst RN, BSN Entered By: Ellen Lane on 05/18/2021 12:03:21 -------------------------------------------------------------------------------- Pain Assessment Details Patient Name: Date of Service: Ellen Stai. 05/18/2021 9:45  A M Medical Record Number: [id number] Patient Account Number: [id number] Date of Birth/Sex: Treating RN: 25-Nov-1965 (55 y.o. Ellen Lane Primary Care Ellen Lane: Ellen Lane Other Clinician: Referring Ellen Lane: Treating Ellen Lane: Ellen Lane in Treatment: 3 Active Problems Location of Pain Severity and Description of Pain Patient Has Paino No Site Locations Pain Management and Medication Current Pain Management: Electronic Signature(s) Signed: 05/18/2021 5:51:46 PM By: Ellen Hurst RN, BSN Entered By: Ellen Lane on 05/18/2021 10:31:01 -------------------------------------------------------------------------------- Patient/Caregiver Education Details Patient Name: Date of Service: Ellen Stai 8/1/2022andnbsp9:45 A M Medical Record Number: [id number] Patient Account Number: [id number] Date of Birth/Gender: Treating RN: [birth year] (55 y.o. Ellen Lane Primary Care Physician: Ellen Lane Other Clinician: Referring Physician: Treating Physician/Extender: Ellen Lane in Treatment: 3 Education Assessment Education Provided To: Patient Education Topics Provided Electronic Signature(s) Signed: 05/18/2021 5:51:46 PM By: Ellen Hurst RN, BSN Entered By: Ellen Lane on 05/18/2021 12:03:31 -------------------------------------------------------------------------------- Wound Assessment Details Patient Name: Date of Service: Ellen Stai. 05/18/2021 9:45 A M Medical Record Number: [id number] Patient Account Number: [id number] Date of Birth/Sex: Treating RN: [birth year] (55 y.o. Ellen Lane Primary Care Hadlie Gipson: Ellen Lane Other Clinician: Referring Pacey Altizer: Treating Cass Vandermeulen/Extender: Ellen Lane in Treatment: 3 Wound Status Wound Number: 1 Primary Pressure Ulcer Etiology: Wound Location: Sacrum Wound Status: Open Wounding Event:  Pressure Injury Comorbid Cataracts, Anemia, Sleep Apnea, Hypertension, Type II Date Acquired: 04/17/2021 History: Diabetes, Paraplegia Weeks Of Treatment: 3 Clustered Wound: No Photos Wound Measurements Length: (cm) 0.1 Width: (cm) 0.1 Depth: (cm) 0.1 Area: (cm) 0.008 Volume: (cm) 0.001 % Reduction in Area: 97.7% % Reduction in Volume: 98.6% Epithelialization: Large (67-100%) Tunneling: No Undermining: No Wound Description Classification: Category/Stage III Wound Margin: Distinct, outline attached Exudate Amount: Medium Exudate Type: Serosanguineous Exudate Color: red, brown Foul Odor After Cleansing: No Slough/Fibrino No Wound Bed Granulation Amount: Large (67-100%) Exposed Structure Granulation Quality: Pink, Pale Fascia Exposed: No Necrotic Amount: None Present (0%) Fat Layer (Subcutaneous Tissue) Exposed: Yes Tendon Exposed: No Muscle Exposed: No Joint Exposed: No Bone Exposed: No Treatment Notes Wound #1 (Sacrum) Cleanser Normal Saline Discharge Instruction: Cleanse the wound with Normal Saline prior to applying a clean dressing using gauze sponges, not tissue or cotton balls. Soap and Water Discharge Instruction: May shower and wash wound with dial antibacterial soap and water prior to dressing change. Peri-Wound Care Skin Prep Discharge Instruction: Use skin prep as directed Topical Primary Dressing Hydrofera Blue Ready Foam, 2.5 x2.5 in Discharge Instruction: Apply to wound bed as instructed Santyl Ointment Discharge Instruction: Apply nickel thick amount to wound bed under Hydrofera, apply only in clinic. Secondary Dressing Zetuvit Plus Silicone Border Dressing 4x4 (in/in) Discharge Instruction: Apply silicone border over primary dressing as directed. Secured With Compression Wrap Compression Stockings  Add-Ons Electronic Signature(s) Signed: 05/18/2021 5:51:46 PM By: Ellen Hurst RN, BSN Entered By: Ellen Lane on 05/18/2021  10:27:08 -------------------------------------------------------------------------------- [place] Details Patient Name: Date of Service: Ellen Stai. 05/18/2021 9:45 A M Medical Record Number: [id number] Patient Account Number: [id number] Date of Birth/Sex: Treating RN: Oct 12, 1966 (55 y.o. Ellen Lane Primary Care Charolette Bultman: Ellen Lane Other Clinician: Referring Huckleberry Martinson: Treating Mingo Siegert/Extender: Ellen Lane in Treatment: 3 Vital Signs Time Taken: 10:30 Temperature (F): 98.4 Height (in): 62 Pulse (bpm): 69 Weight (lbs): 170 Respiratory Rate (breaths/min): 16 Body Mass Index (BMI): 31.1 Blood Pressure (mmHg): 128/82 Reference Range: 80 - 120 mg / dl Electronic Signature(s) Signed: 05/18/2021 5:51:46 PM By: Ellen Hurst RN, BSN Entered By: Ellen Lane on 05/18/2021 10:30:56

## 2021-05-19 ENCOUNTER — Other Ambulatory Visit (HOSPITAL_COMMUNITY): Payer: Self-pay

## 2021-05-20 ENCOUNTER — Other Ambulatory Visit (HOSPITAL_COMMUNITY): Payer: Self-pay

## 2021-05-20 MED ORDER — OZEMPIC (1 MG/DOSE) 4 MG/3ML ~~LOC~~ SOPN
PEN_INJECTOR | SUBCUTANEOUS | 5 refills | Status: DC
  Filled 2021-05-20: qty 3, 28d supply, fill #0
  Filled 2021-06-12: qty 3, 28d supply, fill #1
  Filled 2021-07-21: qty 3, 28d supply, fill #2
  Filled 2021-08-17: qty 3, 28d supply, fill #3

## 2021-05-25 ENCOUNTER — Other Ambulatory Visit: Payer: Self-pay | Admitting: Infectious Disease

## 2021-05-25 ENCOUNTER — Other Ambulatory Visit (HOSPITAL_COMMUNITY): Payer: Self-pay

## 2021-05-25 MED ORDER — BIKTARVY 50-200-25 MG PO TABS
1.0000 | ORAL_TABLET | Freq: Every day | ORAL | 5 refills | Status: DC
  Filled 2021-05-25: qty 30, 30d supply, fill #0
  Filled 2021-06-19: qty 30, 30d supply, fill #1
  Filled 2021-07-17: qty 30, 30d supply, fill #2
  Filled 2021-08-17: qty 30, 30d supply, fill #3
  Filled 2021-09-07: qty 30, 30d supply, fill #4
  Filled 2021-11-20: qty 30, 30d supply, fill #5

## 2021-05-26 ENCOUNTER — Other Ambulatory Visit (HOSPITAL_COMMUNITY): Payer: Self-pay

## 2021-05-28 ENCOUNTER — Other Ambulatory Visit (HOSPITAL_COMMUNITY): Payer: Self-pay

## 2021-05-28 MED ORDER — OXYCODONE-ACETAMINOPHEN 5-325 MG PO TABS
ORAL_TABLET | ORAL | 0 refills | Status: DC
  Filled 2021-05-28: qty 30, 10d supply, fill #0

## 2021-05-29 ENCOUNTER — Other Ambulatory Visit (HOSPITAL_COMMUNITY): Payer: Self-pay

## 2021-06-01 ENCOUNTER — Other Ambulatory Visit: Payer: Self-pay

## 2021-06-01 ENCOUNTER — Encounter (HOSPITAL_BASED_OUTPATIENT_CLINIC_OR_DEPARTMENT_OTHER): Payer: Medicare Other | Admitting: Internal Medicine

## 2021-06-01 DIAGNOSIS — G8220 Paraplegia, unspecified: Secondary | ICD-10-CM | POA: Diagnosis not present

## 2021-06-01 DIAGNOSIS — B20 Human immunodeficiency virus [HIV] disease: Secondary | ICD-10-CM

## 2021-06-01 DIAGNOSIS — E11622 Type 2 diabetes mellitus with other skin ulcer: Secondary | ICD-10-CM | POA: Diagnosis not present

## 2021-06-01 DIAGNOSIS — E119 Type 2 diabetes mellitus without complications: Secondary | ICD-10-CM

## 2021-06-01 DIAGNOSIS — L89153 Pressure ulcer of sacral region, stage 3: Secondary | ICD-10-CM
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
## 2021-06-02 NOTE — Progress Notes (Signed)
Ellen Lane, Ellen Lane ([id number]) Visit Report for 06/01/2021 Arrival Information Details Patient Name: Date of Service: Ellen Lane, Ellen Lane 06/01/2021 9:45 A M Medical Record Number: [id number] Patient Account Number: [id number] Date of Birth/Sex: Treating RN: [birth year] (55 y.o. Tonita Phoenix, Lauren Primary Care Danna Casella: Vicenta Aly Other Clinician: Referring Chael Urenda: Treating Zyheir Daft/Extender: Clydene Pugh in Treatment: 5 Visit Information History Since Last Visit Added or deleted any medications: No Patient Arrived: Wheel Chair Any new allergies or adverse reactions: No Arrival Time: 09:41 Had a fall or experienced change in No Accompanied By: self activities of daily living that may affect Transfer Assistance: Transfer Board risk of falls: Patient Identification Verified: Yes Signs or symptoms of abuse/neglect since last visito No Secondary Verification Process Completed: Yes Hospitalized since last visit: No Patient Requires Transmission-Based Precautions: No Implantable device outside of the clinic excluding No Patient Has Alerts: No cellular tissue based products placed in the center since last visit: Has Dressing in Place as Prescribed: Yes Pain Present Now: Yes Electronic Signature(s) Signed: 06/02/2021 5:16:57 PM By: Rhae Hammock RN Entered By: Rhae Hammock on 06/01/2021 09:41:33 -------------------------------------------------------------------------------- Clinic Level of Care Assessment Details Patient Name: Date of Service: Ellen Lane 06/01/2021 9:45 A M Medical Record Number: [id number] Patient Account Number: [id number] Date of Birth/Sex: Treating RN: 06-23-1966 (55 y.o. [name] Primary Care Terrilee Dudzik: Vicenta Aly Other Clinician: Referring Christinea Brizuela: Treating Shanna Un/Extender: Clydene Pugh in Treatment: 5 Clinic Level of Care Assessment Items TOOL 4 Quantity Score X- 1  0 Use when only an EandM is performed on FOLLOW-UP visit ASSESSMENTS - Nursing Assessment / Reassessment X- 1 10 Reassessment of Co-morbidities (includes updates in patient status) X- 1 5 Reassessment of Adherence to Treatment Plan ASSESSMENTS - Wound and Skin A ssessment / Reassessment X - Simple Wound Assessment / Reassessment - one wound 1 5 '[]'$  - 0 Complex Wound Assessment / Reassessment - multiple wounds '[]'$  - 0 Dermatologic / Skin Assessment (not related to wound area) ASSESSMENTS - Focused Assessment '[]'$  - 0 Circumferential Edema Measurements - multi extremities '[]'$  - 0 Nutritional Assessment / Counseling / Intervention '[]'$  - 0 Lower Extremity Assessment (monofilament, tuning fork, pulses) '[]'$  - 0 Peripheral Arterial Disease Assessment (using hand held doppler) ASSESSMENTS - Ostomy and/or Continence Assessment and Care '[]'$  - 0 Incontinence Assessment and Management '[]'$  - 0 Ostomy Care Assessment and Management (repouching, etc.) PROCESS - Coordination of Care '[]'$  - 0 Simple Patient / Family Education for ongoing care X- 1 20 Complex (extensive) Patient / Family Education for ongoing care '[]'$  - 0 Staff obtains Programmer, systems, Records, T Results / Process Orders est '[]'$  - 0 Staff telephones HHA, Nursing Homes / Clarify orders / etc '[]'$  - 0 Routine Transfer to another Facility (non-emergent condition) '[]'$  - 0 Routine Hospital Admission (non-emergent condition) '[]'$  - 0 New Admissions / Biomedical engineer / Ordering NPWT Apligraf, etc. , '[]'$  - 0 Emergency Hospital Admission (emergent condition) X- 1 10 Simple Discharge Coordination '[]'$  - 0 Complex (extensive) Discharge Coordination PROCESS - Special Needs '[]'$  - 0 Pediatric / Minor Patient Management '[]'$  - 0 Isolation Patient Management '[]'$  - 0 Hearing / Language / Visual special needs '[]'$  - 0 Assessment of Community assistance (transportation, D/C planning, etc.) '[]'$  - 0 Additional assistance / Altered mentation '[]'$  -  0 Support Surface(s) Assessment (bed, cushion, seat, etc.) INTERVENTIONS - Wound Cleansing / Measurement X - Simple Wound Cleansing - one wound 1 5 '[]'$  - 0 Complex Wound Cleansing - multiple wounds  X- 1 5 Wound Imaging (photographs - any number of wounds) '[]'$  - 0 Wound Tracing (instead of photographs) '[]'$  - 0 Simple Wound Measurement - one wound '[]'$  - 0 Complex Wound Measurement - multiple wounds INTERVENTIONS - Wound Dressings X - Small Wound Dressing one or multiple wounds 1 10 '[]'$  - 0 Medium Wound Dressing one or multiple wounds '[]'$  - 0 Large Wound Dressing one or multiple wounds '[]'$  - 0 Application of Medications - topical '[]'$  - 0 Application of Medications - injection INTERVENTIONS - Miscellaneous '[]'$  - 0 External ear exam '[]'$  - 0 Specimen Collection (cultures, biopsies, blood, body fluids, etc.) '[]'$  - 0 Specimen(s) / Culture(s) sent or taken to Lab for analysis '[]'$  - 0 Patient Transfer (multiple staff / Civil Service fast streamer / Similar devices) '[]'$  - 0 Simple Staple / Suture removal (25 or less) '[]'$  - 0 Complex Staple / Suture removal (26 or more) '[]'$  - 0 Hypo / Hyperglycemic Management (close monitor of Blood Glucose) '[]'$  - 0 Ankle / Brachial Index (ABI) - do not check if billed separately X- 1 5 Vital Signs Has the patient been seen at the hospital within the last three years: Yes Total Score: 75 Level Of Care: New/Established - Level 2 Electronic Signature(s) Signed: 06/01/2021 5:36:52 PM By: Lorrin Jackson Entered By: Lorrin Jackson on 06/01/2021 10:06:10 -------------------------------------------------------------------------------- Encounter Discharge Information Details Patient Name: Date of Service: Ellen Lane. 06/01/2021 9:45 A M Medical Record Number: [id number] Patient Account Number: [id number] Date of Birth/Sex: Treating RN: 1966-06-17 (55 y.o. Helene Shoe, Tammi Klippel Primary Care Natashia Roseman: Vicenta Aly Other Clinician: Referring Jamall Strohmeier: Treating Garyson Stelly/Extender:  Clydene Pugh in Treatment: 5 Encounter Discharge Information Items Discharge Condition: Stable Ambulatory Status: Wheelchair Discharge Destination: Home Transportation: Private Auto Accompanied By: self Schedule Follow-up Appointment: No Clinical Summary of Care: Electronic Signature(s) Signed: 06/01/2021 5:38:02 PM By: Deon Pilling Entered By: Deon Pilling on 06/01/2021 13:14:53 -------------------------------------------------------------------------------- Lower Extremity Assessment Details Patient Name: Date of Service: ARLONE, WEHRER 06/01/2021 9:45 A M Medical Record Number: [id number] Patient Account Number: [id number] Date of Birth/Sex: Treating RN: 1966-05-23 (55 y.o. Tonita Phoenix, Lauren Primary Care Adelae Yodice: Vicenta Aly Other Clinician: Referring Jostin Rue: Treating Liyah Higham/Extender: Clydene Pugh in Treatment: 5 Electronic Signature(s) Signed: 06/02/2021 5:16:57 PM By: Rhae Hammock RN Entered By: Rhae Hammock on 06/01/2021 09:42:07 -------------------------------------------------------------------------------- Multi Wound Chart Details Patient Name: Date of Service: Ellen Lane. 06/01/2021 9:45 A M Medical Record Number: [id number] Patient Account Number: [id number] Date of Birth/Sex: Treating RN: [birth year] (55 y.o. [name] Primary Care Flo Berroa: Vicenta Aly Other Clinician: Referring Jethro Radke: Treating Mohsen Odenthal/Extender: Clydene Pugh in Treatment: 5 Vital Signs Height(in): 62 Pulse(bpm): 93 Weight(lbs): 170 Blood Pressure(mmHg): 82/61 Body Mass Index(BMI): 31 Temperature(F): 98.7 Respiratory Rate(breaths/min): 17 Photos: [N/A:N/A] Sacrum N/A N/A Wound Location: Pressure Injury N/A N/A Wounding Event: Pressure Ulcer N/A N/A Primary Etiology: Cataracts, Anemia, Sleep Apnea, N/A N/A Comorbid History: Hypertension, Type II  Diabetes, Paraplegia 04/17/2021 N/A N/A Date Acquired: 5 N/A N/A Weeks of Treatment: Healed - Epithelialized N/A N/A Wound Status: 0x0x0 N/A N/A Measurements L x W x D (cm) 0 N/A N/A A (cm) : rea 0 N/A N/A Volume (cm) : 100.00% N/A N/A % Reduction in A rea: 100.00% N/A N/A % Reduction in Volume: Category/Stage III N/A N/A Classification: Medium N/A N/A Exudate A mount: Serosanguineous N/A N/A Exudate Type: red, brown N/A N/A Exudate Color: Distinct, outline attached N/A N/A Wound Margin: Large (67-100%) N/A N/A Granulation A mount: Pink,  Pale N/A N/A Granulation Quality: None Present (0%) N/A N/A Necrotic A mount: Fat Layer (Subcutaneous Tissue): Yes N/A N/A Exposed Structures: Fascia: No Tendon: No Muscle: No Joint: No Bone: No Large (67-100%) N/A N/A Epithelialization: Treatment Notes Electronic Signature(s) Signed: 06/01/2021 10:42:27 AM By: Kalman Shan DO Signed: 06/01/2021 5:36:52 PM By: Lorrin Jackson Entered By: Kalman Shan on 06/01/2021 10:39:41 -------------------------------------------------------------------------------- Multi-Disciplinary Care Plan Details Patient Name: Date of Service: Ellen Lane. 06/01/2021 9:45 A M Medical Record Number: [id number] Patient Account Number: [id number] Date of Birth/Sex: Treating RN: [birth year] (55 y.o. [name] Primary Care Mathan Darroch: Vicenta Aly Other Clinician: Referring Azie Mcconahy: Treating Arye Weyenberg/Extender: Clydene Pugh in Treatment: 5 Active Inactive Electronic Signature(s) Signed: 06/01/2021 5:36:52 PM By: Lorrin Jackson Previous Signature: 06/01/2021 9:42:08 AM Version By: Lorrin Jackson Entered By: Lorrin Jackson on 06/01/2021 10:05:05 -------------------------------------------------------------------------------- Pain Assessment Details Patient Name: Date of Service: Ellen Lane. 06/01/2021 9:45 A M Medical Record Number:  [id number] Patient Account Number: [id number] Date of Birth/Sex: Treating RN: 23-Jul-1966 (55 y.o. Tonita Phoenix, Lauren Primary Care Khamille Beynon: Vicenta Aly Other Clinician: Referring Kaitlin Alcindor: Treating Litzy Dicker/Extender: Clydene Pugh in Treatment: 5 Active Problems Location of Pain Severity and Description of Pain Patient Has Paino Yes Site Locations Pain Location: Generalized Pain, Pain in Ulcers With Dressing Change: Yes Duration of the Pain. Constant / Intermittento Constant Rate the pain. Current Pain Level: 8 Worst Pain Level: 10 Least Pain Level: 0 Tolerable Pain Level: 8 Character of Pain Describe the Pain: Aching Pain Management and Medication Current Pain Management: Medication: No Cold Application: No Rest: No Massage: No Activity: No T.E.N.S.: No Heat Application: No Leg drop or elevation: No Is the Current Pain Management Adequate: Adequate How does your wound impact your activities of daily livingo Sleep: No Bathing: No Appetite: No Relationship With Others: No Bladder Continence: No Emotions: No Bowel Continence: No Work: No Toileting: No Drive: No Dressing: No Hobbies: No Electronic Signature(s) Signed: 06/02/2021 5:16:57 PM By: Rhae Hammock RN Entered By: Rhae Hammock on 06/01/2021 09:42:01 -------------------------------------------------------------------------------- Patient/Caregiver Education Details Patient Name: Date of Service: Ellen Lane 8/15/2022andnbsp9:45 A M Medical Record Number: [id number] Patient Account Number: [id number] Date of Birth/Gender: Treating RN: 12-Oct-1966 (55 y.o. [name] Primary Care Physician: Vicenta Aly Other Clinician: Referring Physician: Treating Physician/Extender: Clydene Pugh in Treatment: 5 Education Assessment Education Provided To: Patient Education Topics Provided Pressure: Methods: Explain/Verbal,  Printed Responses: State content correctly Wound/Skin Impairment: Methods: Explain/Verbal, Printed Responses: State content correctly Electronic Signature(s) Signed: 06/01/2021 5:36:52 PM By: Lorrin Jackson Entered By: Lorrin Jackson on 06/01/2021 09:42:27 -------------------------------------------------------------------------------- Wound Assessment Details Patient Name: Date of Service: Ellen Lane. 06/01/2021 9:45 A M Medical Record Number: [id number] Patient Account Number: [id number] Date of Birth/Sex: Treating RN: [birth year] (55 y.o. Tonita Phoenix, Lauren Primary Care Verlisa [name]: Vicenta Aly Other Clinician: Referring Macalister Arnaud: Treating Laney Bagshaw/Extender: Clydene Pugh in Treatment: 5 Wound Status Wound Number: 1 Primary Pressure Ulcer Etiology: Wound Location: Sacrum Wound Status: Healed - Epithelialized Wounding Event: Pressure Injury Comorbid Cataracts, Anemia, Sleep Apnea, Hypertension, Type II Date Acquired: 04/17/2021 History: Diabetes, Paraplegia Weeks Of Treatment: 5 Clustered Wound: No Photos Wound Measurements Length: (cm) Width: (cm) Depth: (cm) Area: (cm) Volume: (cm) 0 % Reduction in Area: 100% 0 % Reduction in Volume: 100% 0 Epithelialization: Large (67-100%) 0 0 Wound Description Classification: Category/Stage III Wound Margin: Distinct, outline attached Exudate Amount: Medium Exudate Type: Serosanguineous Exudate Color: red, brown Foul Odor After Cleansing: No Slough/Fibrino  No Wound Bed Granulation Amount: Large (67-100%) Exposed Structure Granulation Quality: Pink, Pale Fascia Exposed: No Necrotic Amount: None Present (0%) Fat Layer (Subcutaneous Tissue) Exposed: Yes Tendon Exposed: No Muscle Exposed: No Joint Exposed: No Bone Exposed: No Treatment Notes Wound #1 (Sacrum) Cleanser Peri-Wound Care Topical Ketoconazole Cream 2% Discharge Instruction: Apply Ketoconazole as directed zinc  oxide Primary Dressing Secondary Dressing ABD Pad, 8x10 Discharge Instruction: Apply over primary dressing as directed. Secured With Compression Wrap Compression Stockings Environmental education officer) Signed: 06/02/2021 5:16:57 PM By: Rhae Hammock RN Entered By: Rhae Hammock on 06/01/2021 09:47:13 -------------------------------------------------------------------------------- Vitals Details Patient Name: Date of Service: Ellen Lane. 06/01/2021 9:45 A M Medical Record Number: [id number] Patient Account Number: [id number] Date of Birth/Sex: Treating RN: 12/16/1965 (55 y.o. Tonita Phoenix, Lauren Primary Care Kennedie Pardoe: Vicenta Aly Other Clinician: Referring Abegail Kloeppel: Treating Idelle Reimann/Extender: Clydene Pugh in Treatment: 5 Vital Signs Time Taken: 09:45 Temperature (F): 98.7 Height (in): 62 Pulse (bpm): 93 Weight (lbs): 170 Respiratory Rate (breaths/min): 17 Body Mass Index (BMI): 31.1 Blood Pressure (mmHg): 82/61 Reference Range: 80 - 120 mg / dl Electronic Signature(s) Signed: 06/02/2021 5:16:57 PM By: Rhae Hammock RN Entered By: Rhae Hammock on 06/01/2021 09:45:21

## 2021-06-12 ENCOUNTER — Other Ambulatory Visit (HOSPITAL_COMMUNITY): Payer: Self-pay

## 2021-06-15 ENCOUNTER — Other Ambulatory Visit (HOSPITAL_COMMUNITY): Payer: Self-pay

## 2021-06-16 ENCOUNTER — Other Ambulatory Visit (HOSPITAL_COMMUNITY): Payer: Self-pay

## 2021-06-16 MED ORDER — BACLOFEN 10 MG PO TABS
10.0000 mg | ORAL_TABLET | Freq: Three times a day (TID) | ORAL | 1 refills | Status: DC
  Filled 2021-06-16: qty 270, 90d supply, fill #0
  Filled 2021-09-07: qty 270, 90d supply, fill #1

## 2021-06-19 ENCOUNTER — Other Ambulatory Visit (HOSPITAL_COMMUNITY): Payer: Self-pay

## 2021-06-24 ENCOUNTER — Other Ambulatory Visit (HOSPITAL_COMMUNITY): Payer: Self-pay

## 2021-07-01 ENCOUNTER — Other Ambulatory Visit (HOSPITAL_COMMUNITY): Payer: Self-pay

## 2021-07-01 MED ORDER — ONDANSETRON 4 MG PO TBDP
ORAL_TABLET | ORAL | 0 refills | Status: DC
  Filled 2021-07-01: qty 20, 6d supply, fill #0

## 2021-07-17 ENCOUNTER — Other Ambulatory Visit (HOSPITAL_COMMUNITY): Payer: Self-pay

## 2021-07-20 ENCOUNTER — Other Ambulatory Visit (HOSPITAL_COMMUNITY): Payer: Self-pay

## 2021-07-21 ENCOUNTER — Other Ambulatory Visit (HOSPITAL_COMMUNITY): Payer: Self-pay

## 2021-07-29 ENCOUNTER — Other Ambulatory Visit (HOSPITAL_COMMUNITY): Payer: Self-pay

## 2021-07-30 ENCOUNTER — Other Ambulatory Visit (HOSPITAL_COMMUNITY): Payer: Self-pay

## 2021-08-03 ENCOUNTER — Ambulatory Visit (INDEPENDENT_AMBULATORY_CARE_PROVIDER_SITE_OTHER): Payer: Medicare Other | Admitting: Obstetrics & Gynecology

## 2021-08-03 ENCOUNTER — Other Ambulatory Visit: Payer: Self-pay

## 2021-08-03 ENCOUNTER — Encounter: Payer: Self-pay | Admitting: Obstetrics & Gynecology

## 2021-08-03 ENCOUNTER — Other Ambulatory Visit (HOSPITAL_COMMUNITY)
Admission: RE | Admit: 2021-08-03 | Discharge: 2021-08-03 | Disposition: A | Discharge status: Home / Self Care | Payer: Medicare Other | Source: Ambulatory Visit | Attending: Obstetrics & Gynecology | Admitting: Obstetrics & Gynecology

## 2021-08-03 DIAGNOSIS — Z01419 Encounter for gynecological examination (general) (routine) without abnormal findings: Secondary | ICD-10-CM | POA: Insufficient documentation

## 2021-08-03 DIAGNOSIS — Z1151 Encounter for screening for human papillomavirus (HPV): Secondary | ICD-10-CM | POA: Insufficient documentation

## 2021-08-03 DIAGNOSIS — R87610 Atypical squamous cells of undetermined significance on cytologic smear of cervix (ASC-US): Secondary | ICD-10-CM | POA: Diagnosis not present

## 2021-08-03 NOTE — Patient Instructions (Signed)
Preventive Care 40-53 Years Old, Female Preventive care refers to lifestyle choices and visits with your health care provider that can promote health and wellness. This includes: A yearly physical exam. This is also called an annual wellness visit. Regular dental and eye exams. Immunizations. Screening for certain conditions. Healthy lifestyle choices, such as: Eating a healthy diet. Getting regular exercise. Not using drugs or products that contain nicotine and tobacco. Limiting alcohol use. What can I expect for my preventive care visit? Physical exam Your health care provider will check your: Height and weight. These may be used to calculate your BMI (body mass index). BMI is a measurement that tells if you are at a healthy weight. Heart rate and blood pressure. Body temperature. Skin for abnormal spots. Counseling Your health care provider may ask you questions about your: Past medical problems. Family's medical history. Alcohol, tobacco, and drug use. Emotional well-being. Home life and relationship well-being. Sexual activity. Diet, exercise, and sleep habits. Work and work environment. Access to firearms. Method of birth control. Menstrual cycle. Pregnancy history. What immunizations do I need? Vaccines are usually given at various ages, according to a schedule. Your health care provider will recommend vaccines for you based on your age, medical history, and lifestyle or other factors, such as travel or where you work. What tests do I need? Blood tests Lipid and cholesterol levels. These may be checked every 5 years, or more often if you are over 50 years old. Hepatitis C test. Hepatitis B test. Screening Lung cancer screening. You may have this screening every year starting at age 55 if you have a 30-pack-year history of smoking and currently smoke or have quit within the past 15 years. Colorectal cancer screening. All adults should have this screening starting at  age 50 and continuing until age 75. Your health care provider may recommend screening at age 45 if you are at increased risk. You will have tests every 1-10 years, depending on your results and the type of screening test. Diabetes screening. This is done by checking your blood sugar (glucose) after you have not eaten for a while (fasting). You may have this done every 1-3 years. Mammogram. This may be done every 1-2 years. Talk with your health care provider about when you should start having regular mammograms. This may depend on whether you have a family history of breast cancer. BRCA-related cancer screening. This may be done if you have a family history of breast, ovarian, tubal, or peritoneal cancers. Pelvic exam and Pap test. This may be done every 3 years starting at age 21. Starting at age 30, this may be done every 5 years if you have a Pap test in combination with an HPV test. Other tests STD (sexually transmitted disease) testing, if you are at risk. Bone density scan. This is done to screen for osteoporosis. You may have this scan if you are at high risk for osteoporosis. Talk with your health care provider about your test results, treatment options, and if necessary, the need for more tests. Follow these instructions at home: Eating and drinking  Eat a diet that includes fresh fruits and vegetables, whole grains, lean protein, and low-fat dairy products. Take vitamin and mineral supplements as recommended by your health care provider. Do not drink alcohol if: Your health care provider tells you not to drink. You are pregnant, may be pregnant, or are planning to become pregnant. If you drink alcohol: Limit how much you have to 0-1 drink a day. Be   aware of how much alcohol is in your drink. In the [place] one drink equals one 12 oz bottle of beer (355 mL), one 5 oz glass of wine (148 mL), or one 1 oz glass of hard liquor (44 mL). Lifestyle Take daily care of your teeth and  gums. Brush your teeth every morning and night with fluoride toothpaste. Floss one time each day. Stay active. Exercise for at least 30 minutes 5 or more days each week. Do not use any products that contain nicotine or tobacco, such as cigarettes, e-cigarettes, and chewing tobacco. If you need help quitting, ask your health care provider. Do not use drugs. If you are sexually active, practice safe sex. Use a condom or other form of protection to prevent STIs (sexually transmitted infections). If you do not wish to become pregnant, use a form of birth control. If you plan to become pregnant, see your health care provider for a prepregnancy visit. If told by your health care provider, take low-dose aspirin daily starting at age 63. Find healthy ways to cope with stress, such as: Meditation, yoga, or listening to music. Journaling. Talking to a trusted person. Spending time with friends and family. Safety Always wear your seat belt while driving or riding in a vehicle. Do not drive: If you have been drinking alcohol. Do not ride with someone who has been drinking. When you are tired or distracted. While texting. Wear a helmet and other protective equipment during sports activities. If you have firearms in your house, make sure you follow all gun safety procedures. What's next? Visit your health care provider once a year for an annual wellness visit. Ask your health care provider how often you should have your eyes and teeth checked. Stay up to date on all vaccines. This information is not intended to replace advice given to you by your health care provider. Make sure you discuss any questions you have with your health care provider. Document Revised: 12/12/2020 Document Reviewed: 06/15/2018 Elsevier Patient Education  2022 [employer].
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
## 2021-08-05 LAB — CYTOLOGY - PAP
Comment: NEGATIVE
Diagnosis: UNDETERMINED — AB
High risk HPV: NEGATIVE

## 2021-08-14 ENCOUNTER — Other Ambulatory Visit (HOSPITAL_COMMUNITY): Payer: Self-pay

## 2021-08-17 ENCOUNTER — Other Ambulatory Visit (HOSPITAL_COMMUNITY): Payer: Self-pay

## 2021-08-18 ENCOUNTER — Other Ambulatory Visit (HOSPITAL_COMMUNITY): Payer: Self-pay

## 2021-08-18 MED ORDER — METFORMIN HCL 500 MG PO TABS
500.0000 mg | ORAL_TABLET | Freq: Two times a day (BID) | ORAL | 3 refills | Status: DC
  Filled 2021-08-18: qty 180, 90d supply, fill #0

## 2021-08-31 ENCOUNTER — Ambulatory Visit: Payer: Medicare Other | Admitting: Infectious Disease

## 2021-09-01 ENCOUNTER — Telehealth: Payer: Self-pay

## 2021-09-01 NOTE — Telephone Encounter (Signed)
Called patient regarding pap smear results. Informed patient of results and follow up plan. Patient confirmed understanding and did not have any questions.  Paulina Fusi, RN

## 2021-09-07 ENCOUNTER — Other Ambulatory Visit (HOSPITAL_COMMUNITY): Payer: Self-pay

## 2021-09-07 MED ORDER — OZEMPIC (2 MG/DOSE) 8 MG/3ML ~~LOC~~ SOPN
PEN_INJECTOR | SUBCUTANEOUS | 5 refills | Status: DC
  Filled 2021-09-07: qty 3, 28d supply, fill #0

## 2021-09-08 ENCOUNTER — Other Ambulatory Visit (HOSPITAL_COMMUNITY): Payer: Self-pay

## 2021-09-08 MED ORDER — OXYBUTYNIN CHLORIDE 5 MG PO TABS
ORAL_TABLET | ORAL | 3 refills | Status: DC
  Filled 2021-09-08: qty 270, 90d supply, fill #0

## 2021-09-14 ENCOUNTER — Other Ambulatory Visit (HOSPITAL_COMMUNITY): Payer: Self-pay

## 2021-09-15 ENCOUNTER — Other Ambulatory Visit (HOSPITAL_COMMUNITY): Payer: Self-pay

## 2021-09-17 ENCOUNTER — Encounter (HOSPITAL_COMMUNITY): Payer: Self-pay | Admitting: Emergency Medicine

## 2021-09-17 ENCOUNTER — Emergency Department (HOSPITAL_COMMUNITY)
Admission: EM | Admit: 2021-09-17 | Discharge: 2021-09-17 | Disposition: A | Discharge status: Home / Self Care | Payer: Medicare Other | Attending: Emergency Medicine | Admitting: Emergency Medicine

## 2021-09-17 ENCOUNTER — Other Ambulatory Visit (HOSPITAL_COMMUNITY): Payer: Self-pay

## 2021-09-17 ENCOUNTER — Emergency Department (HOSPITAL_COMMUNITY): Payer: Medicare Other

## 2021-09-17 ENCOUNTER — Other Ambulatory Visit: Payer: Self-pay

## 2021-09-17 DIAGNOSIS — E119 Type 2 diabetes mellitus without complications: Secondary | ICD-10-CM | POA: Insufficient documentation

## 2021-09-17 DIAGNOSIS — Z7982 Long term (current) use of aspirin: Secondary | ICD-10-CM | POA: Insufficient documentation

## 2021-09-17 DIAGNOSIS — Z21 Asymptomatic human immunodeficiency virus [HIV] infection status: Secondary | ICD-10-CM | POA: Insufficient documentation

## 2021-09-17 DIAGNOSIS — I10 Essential (primary) hypertension: Secondary | ICD-10-CM | POA: Diagnosis not present

## 2021-09-17 DIAGNOSIS — Z7984 Long term (current) use of oral hypoglycemic drugs: Secondary | ICD-10-CM | POA: Diagnosis not present

## 2021-09-17 DIAGNOSIS — Z79899 Other long term (current) drug therapy: Secondary | ICD-10-CM | POA: Diagnosis not present

## 2021-09-17 DIAGNOSIS — M549 Dorsalgia, unspecified: Secondary | ICD-10-CM

## 2021-09-17 DIAGNOSIS — M546 Pain in thoracic spine: Secondary | ICD-10-CM | POA: Insufficient documentation

## 2021-09-17 MED ORDER — KETOROLAC TROMETHAMINE 15 MG/ML IJ SOLN
15.0000 mg | Freq: Once | INTRAMUSCULAR | Status: AC
  Administered 2021-09-17: 15 mg via INTRAMUSCULAR

## 2021-09-17 MED ORDER — KETOROLAC TROMETHAMINE 15 MG/ML IJ SOLN
15.0000 mg | Freq: Once | INTRAMUSCULAR | Status: DC
  Filled 2021-09-17: qty 1

## 2021-09-17 MED ORDER — OXYCODONE-ACETAMINOPHEN 5-325 MG PO TABS
1.0000 | ORAL_TABLET | Freq: Once | ORAL | Status: AC
  Administered 2021-09-17: 1 via ORAL
  Filled 2021-09-17: qty 1
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
## 2021-09-17 NOTE — ED Triage Notes (Signed)
Pt c/o new upper back pain x3 days. Pt requesting xray. Pt nonambulatory, in her own motorized chair.

## 2021-09-18 ENCOUNTER — Other Ambulatory Visit (HOSPITAL_COMMUNITY): Payer: Self-pay

## 2021-09-18 MED ORDER — OXYCODONE-ACETAMINOPHEN 5-325 MG PO TABS
ORAL_TABLET | ORAL | 0 refills | Status: DC
  Filled 2021-09-18: qty 30, 10d supply, fill #0

## 2021-09-23 ENCOUNTER — Other Ambulatory Visit (HOSPITAL_COMMUNITY): Payer: Self-pay

## 2021-09-23 MED ORDER — GABAPENTIN 400 MG PO CAPS
ORAL_CAPSULE | ORAL | 2 refills | Status: DC
  Filled 2021-09-23: qty 90, 30d supply, fill #0

## 2021-09-24 ENCOUNTER — Emergency Department (HOSPITAL_COMMUNITY): Payer: Medicare Other

## 2021-09-24 ENCOUNTER — Inpatient Hospital Stay (HOSPITAL_COMMUNITY)
Admission: EM | Admit: 2021-09-24 | Discharge: 2021-10-10 | DRG: 028 | Disposition: A | Discharge status: Rehabilitation Facility | Payer: Medicare Other | Attending: Internal Medicine | Admitting: Internal Medicine

## 2021-09-24 ENCOUNTER — Ambulatory Visit: Payer: Medicare Other | Admitting: Infectious Disease

## 2021-09-24 ENCOUNTER — Inpatient Hospital Stay (HOSPITAL_COMMUNITY): Payer: Medicare Other

## 2021-09-24 ENCOUNTER — Encounter (HOSPITAL_COMMUNITY): Payer: Self-pay

## 2021-09-24 DIAGNOSIS — G062 Extradural and subdural abscess, unspecified: Secondary | ICD-10-CM | POA: Diagnosis not present

## 2021-09-24 DIAGNOSIS — G894 Chronic pain syndrome: Secondary | ICD-10-CM | POA: Diagnosis present

## 2021-09-24 DIAGNOSIS — Z888 Allergy status to other drugs, medicaments and biological substances status: Secondary | ICD-10-CM

## 2021-09-24 DIAGNOSIS — Z20822 Contact with and (suspected) exposure to covid-19: Secondary | ICD-10-CM | POA: Diagnosis present

## 2021-09-24 DIAGNOSIS — B20 Human immunodeficiency virus [HIV] disease: Secondary | ICD-10-CM | POA: Diagnosis present

## 2021-09-24 DIAGNOSIS — J90 Pleural effusion, not elsewhere classified: Secondary | ICD-10-CM | POA: Diagnosis present

## 2021-09-24 DIAGNOSIS — Z419 Encounter for procedure for purposes other than remedying health state, unspecified: Secondary | ICD-10-CM

## 2021-09-24 DIAGNOSIS — Z9884 Bariatric surgery status: Secondary | ICD-10-CM

## 2021-09-24 DIAGNOSIS — K5900 Constipation, unspecified: Secondary | ICD-10-CM | POA: Diagnosis present

## 2021-09-24 DIAGNOSIS — D649 Anemia, unspecified: Secondary | ICD-10-CM | POA: Diagnosis present

## 2021-09-24 DIAGNOSIS — G8221 Paraplegia, complete: Secondary | ICD-10-CM | POA: Diagnosis present

## 2021-09-24 DIAGNOSIS — N319 Neuromuscular dysfunction of bladder, unspecified: Secondary | ICD-10-CM | POA: Diagnosis present

## 2021-09-24 DIAGNOSIS — Z23 Encounter for immunization: Secondary | ICD-10-CM

## 2021-09-24 DIAGNOSIS — R222 Localized swelling, mass and lump, trunk: Secondary | ICD-10-CM

## 2021-09-24 DIAGNOSIS — F32A Depression, unspecified: Secondary | ICD-10-CM | POA: Diagnosis present

## 2021-09-24 DIAGNOSIS — E669 Obesity, unspecified: Secondary | ICD-10-CM | POA: Diagnosis present

## 2021-09-24 DIAGNOSIS — Z7984 Long term (current) use of oral hypoglycemic drugs: Secondary | ICD-10-CM | POA: Diagnosis not present

## 2021-09-24 DIAGNOSIS — R252 Cramp and spasm: Secondary | ICD-10-CM | POA: Diagnosis not present

## 2021-09-24 DIAGNOSIS — M4644 Discitis, unspecified, thoracic region: Secondary | ICD-10-CM | POA: Diagnosis present

## 2021-09-24 DIAGNOSIS — G4733 Obstructive sleep apnea (adult) (pediatric): Secondary | ICD-10-CM | POA: Diagnosis present

## 2021-09-24 DIAGNOSIS — M546 Pain in thoracic spine: Secondary | ICD-10-CM

## 2021-09-24 DIAGNOSIS — L89312 Pressure ulcer of right buttock, stage 2: Secondary | ICD-10-CM | POA: Diagnosis present

## 2021-09-24 DIAGNOSIS — S24109S Unspecified injury at unspecified level of thoracic spinal cord, sequela: Secondary | ICD-10-CM | POA: Diagnosis not present

## 2021-09-24 DIAGNOSIS — Z79899 Other long term (current) drug therapy: Secondary | ICD-10-CM

## 2021-09-24 DIAGNOSIS — L89153 Pressure ulcer of sacral region, stage 3: Secondary | ICD-10-CM | POA: Diagnosis present

## 2021-09-24 DIAGNOSIS — G061 Intraspinal abscess and granuloma: Principal | ICD-10-CM | POA: Diagnosis present

## 2021-09-24 DIAGNOSIS — D259 Leiomyoma of uterus, unspecified: Secondary | ICD-10-CM | POA: Diagnosis present

## 2021-09-24 DIAGNOSIS — E119 Type 2 diabetes mellitus without complications: Secondary | ICD-10-CM

## 2021-09-24 DIAGNOSIS — L89152 Pressure ulcer of sacral region, stage 2: Secondary | ICD-10-CM | POA: Diagnosis present

## 2021-09-24 DIAGNOSIS — F5104 Psychophysiologic insomnia: Secondary | ICD-10-CM | POA: Diagnosis present

## 2021-09-24 DIAGNOSIS — N3289 Other specified disorders of bladder: Secondary | ICD-10-CM | POA: Diagnosis present

## 2021-09-24 DIAGNOSIS — E11649 Type 2 diabetes mellitus with hypoglycemia without coma: Secondary | ICD-10-CM | POA: Diagnosis not present

## 2021-09-24 DIAGNOSIS — M4620 Osteomyelitis of vertebra, site unspecified: Secondary | ICD-10-CM | POA: Diagnosis not present

## 2021-09-24 DIAGNOSIS — Y838 Other surgical procedures as the cause of abnormal reaction of the patient, or of later complication, without mention of misadventure at the time of the procedure: Secondary | ICD-10-CM | POA: Diagnosis not present

## 2021-09-24 DIAGNOSIS — Z6838 Body mass index (BMI) 38.0-38.9, adult: Secondary | ICD-10-CM

## 2021-09-24 DIAGNOSIS — E1169 Type 2 diabetes mellitus with other specified complication: Secondary | ICD-10-CM | POA: Diagnosis present

## 2021-09-24 DIAGNOSIS — G8220 Paraplegia, unspecified: Secondary | ICD-10-CM | POA: Diagnosis present

## 2021-09-24 DIAGNOSIS — M4624 Osteomyelitis of vertebra, thoracic region: Secondary | ICD-10-CM | POA: Diagnosis present

## 2021-09-24 DIAGNOSIS — D62 Acute posthemorrhagic anemia: Secondary | ICD-10-CM | POA: Diagnosis present

## 2021-09-24 DIAGNOSIS — R5381 Other malaise: Secondary | ICD-10-CM | POA: Diagnosis present

## 2021-09-24 DIAGNOSIS — R0609 Other forms of dyspnea: Secondary | ICD-10-CM | POA: Diagnosis not present

## 2021-09-24 DIAGNOSIS — Z882 Allergy status to sulfonamides status: Secondary | ICD-10-CM

## 2021-09-24 DIAGNOSIS — K592 Neurogenic bowel, not elsewhere classified: Secondary | ICD-10-CM | POA: Diagnosis present

## 2021-09-24 DIAGNOSIS — M6008 Infective myositis, other site: Secondary | ICD-10-CM | POA: Diagnosis present

## 2021-09-24 DIAGNOSIS — Z9049 Acquired absence of other specified parts of digestive tract: Secondary | ICD-10-CM | POA: Diagnosis not present

## 2021-09-24 DIAGNOSIS — T8131XA Disruption of external operation (surgical) wound, not elsewhere classified, initial encounter: Secondary | ICD-10-CM | POA: Diagnosis not present

## 2021-09-24 DIAGNOSIS — Z794 Long term (current) use of insulin: Secondary | ICD-10-CM

## 2021-09-24 LAB — COMPREHENSIVE METABOLIC PANEL
ALT: 14 U/L (ref 0–44)
AST: 19 U/L (ref 15–41)
Albumin: 2.7 g/dL — ABNORMAL LOW (ref 3.5–5.0)
Alkaline Phosphatase: 183 U/L — ABNORMAL HIGH (ref 38–126)
Anion gap: 9 (ref 5–15)
BUN: 10 mg/dL (ref 6–20)
CO2: 23 mmol/L (ref 22–32)
Calcium: 8.8 mg/dL — ABNORMAL LOW (ref 8.9–10.3)
Chloride: 102 mmol/L (ref 98–111)
Creatinine, Ser: 0.53 mg/dL (ref 0.44–1.00)
GFR, Estimated: >60 mL/min (ref >60)
Glucose, Bld: 115 mg/dL — ABNORMAL HIGH (ref 70–99)
Potassium: 3.7 mmol/L (ref 3.5–5.1)
Sodium: 134 mmol/L — ABNORMAL LOW (ref 135–145)
Total Bilirubin: 0.5 mg/dL (ref 0.3–1.2)
Total Protein: 5.8 g/dL — ABNORMAL LOW (ref 6.5–8.1)

## 2021-09-24 LAB — LIPASE, BLOOD: Lipase: 19 U/L (ref 11–51)

## 2021-09-24 LAB — CBC WITH DIFFERENTIAL/PLATELET
Abs Immature Granulocytes: 0.03 10*3/uL (ref 0.00–0.07)
Basophils Absolute: 0 10*3/uL (ref 0.0–0.1)
Basophils Relative: 0 %
Eosinophils Absolute: 0.1 10*3/uL (ref 0.0–0.5)
Eosinophils Relative: 2 %
HCT: 31.3 % — ABNORMAL LOW (ref 36.0–46.0)
Hemoglobin: 10.2 g/dL — ABNORMAL LOW (ref 12.0–15.0)
Immature Granulocytes: 1 %
Lymphocytes Relative: 20 %
Lymphs Abs: 1.3 10*3/uL (ref 0.7–4.0)
MCH: 26.7 pg (ref 26.0–34.0)
MCHC: 32.6 g/dL (ref 30.0–36.0)
MCV: 81.9 fL (ref 80.0–100.0)
Monocytes Absolute: 0.5 10*3/uL (ref 0.1–1.0)
Monocytes Relative: 7 %
Neutro Abs: 4.5 10*3/uL (ref 1.7–7.7)
Neutrophils Relative %: 70 %
Platelets: 328 10*3/uL (ref 150–400)
RBC: 3.82 MIL/uL — ABNORMAL LOW (ref 3.87–5.11)
RDW: 15 % (ref 11.5–15.5)
WBC: 6.5 10*3/uL (ref 4.0–10.5)
nRBC: 0 % (ref 0.0–0.2)

## 2021-09-24 LAB — TROPONIN I (HIGH SENSITIVITY): Troponin I (High Sensitivity): 5 ng/L (ref <18)

## 2021-09-24 LAB — URINALYSIS, ROUTINE W REFLEX MICROSCOPIC
Bilirubin Urine: NEGATIVE
Glucose, UA: NEGATIVE mg/dL
Ketones, ur: 40 mg/dL — AB
Leukocytes,Ua: NEGATIVE
Nitrite: POSITIVE — AB
Protein, ur: 100 mg/dL — AB
Specific Gravity, Urine: >1.03 — ABNORMAL HIGH (ref 1.005–1.030)
pH: 6 (ref 5.0–8.0)

## 2021-09-24 LAB — RESP PANEL BY RT-PCR (FLU A&B, COVID) ARPGX2
Influenza A by PCR: NEGATIVE
Influenza B by PCR: NEGATIVE
SARS Coronavirus 2 by RT PCR: NEGATIVE

## 2021-09-24 LAB — URINALYSIS, MICROSCOPIC (REFLEX): RBC / HPF: >50 RBC/hpf (ref 0–5)

## 2021-09-24 LAB — SEDIMENTATION RATE: Sed Rate: 20 mm/hr (ref 0–22)

## 2021-09-24 LAB — C-REACTIVE PROTEIN: CRP: 3.8 mg/dL — ABNORMAL HIGH (ref <1.0)

## 2021-09-24 LAB — GLUCOSE, CAPILLARY
Glucose-Capillary: 54 mg/dL — ABNORMAL LOW (ref 70–99)
Glucose-Capillary: 126 mg/dL — ABNORMAL HIGH (ref 70–99)

## 2021-09-24 MED ORDER — FENTANYL CITRATE PF 50 MCG/ML IJ SOSY
50.0000 ug | PREFILLED_SYRINGE | Freq: Once | INTRAMUSCULAR | Status: AC
  Administered 2021-09-24: 50 ug via INTRAVENOUS
  Filled 2021-09-24: qty 1

## 2021-09-24 MED ORDER — ONDANSETRON HCL 4 MG/2ML IJ SOLN
4.0000 mg | Freq: Once | INTRAMUSCULAR | Status: AC
  Administered 2021-09-24: 4 mg via INTRAVENOUS
  Filled 2021-09-24: qty 2

## 2021-09-24 MED ORDER — GADOBUTROL 1 MMOL/ML IV SOLN
9.5000 mL | Freq: Once | INTRAVENOUS | Status: AC | PRN
  Administered 2021-09-24: 9.5 mL via INTRAVENOUS

## 2021-09-24 MED ORDER — ONDANSETRON HCL 4 MG PO TABS
4.0000 mg | ORAL_TABLET | Freq: Four times a day (QID) | ORAL | Status: DC | PRN
  Administered 2021-09-30: 11:00:00 4 mg via ORAL
  Filled 2021-09-24: qty 1

## 2021-09-24 MED ORDER — ONDANSETRON HCL 4 MG/2ML IJ SOLN
4.0000 mg | Freq: Four times a day (QID) | INTRAMUSCULAR | Status: DC | PRN
  Administered 2021-09-26 – 2021-10-08 (×6): 4 mg via INTRAVENOUS
  Filled 2021-09-24 (×6): qty 2

## 2021-09-24 MED ORDER — DOCUSATE SODIUM 100 MG PO CAPS
100.0000 mg | ORAL_CAPSULE | Freq: Two times a day (BID) | ORAL | Status: DC
  Administered 2021-09-24 – 2021-09-25 (×3): 100 mg via ORAL
  Filled 2021-09-24 (×4): qty 1

## 2021-09-24 MED ORDER — ACETAMINOPHEN 325 MG PO TABS: 650.0000 mg | ORAL_TABLET | Freq: Four times a day (QID) | ORAL | Status: DC | PRN

## 2021-09-24 MED ORDER — BACLOFEN 10 MG PO TABS
10.0000 mg | ORAL_TABLET | Freq: Three times a day (TID) | ORAL | Status: DC
  Administered 2021-09-24 – 2021-10-10 (×49): 10 mg via ORAL
  Filled 2021-09-24 (×52): qty 1

## 2021-09-24 MED ORDER — ENOXAPARIN SODIUM 40 MG/0.4ML IJ SOSY: 40.0000 mg | PREFILLED_SYRINGE | INTRAMUSCULAR | Status: DC

## 2021-09-24 MED ORDER — OXYCODONE-ACETAMINOPHEN 5-325 MG PO TABS
1.0000 | ORAL_TABLET | ORAL | Status: DC | PRN
  Administered 2021-09-24 – 2021-10-09 (×18): 1 via ORAL
  Filled 2021-09-24 (×23): qty 1

## 2021-09-24 MED ORDER — OXYBUTYNIN CHLORIDE 5 MG PO TABS
5.0000 mg | ORAL_TABLET | Freq: Three times a day (TID) | ORAL | Status: DC
  Administered 2021-09-24 – 2021-10-10 (×49): 5 mg via ORAL
  Filled 2021-09-24 (×52): qty 1

## 2021-09-24 MED ORDER — BICTEGRAVIR-EMTRICITAB-TENOFOV 50-200-25 MG PO TABS
1.0000 | ORAL_TABLET | Freq: Every day | ORAL | Status: DC
  Administered 2021-09-24 – 2021-10-10 (×17): 1 via ORAL
  Filled 2021-09-24 (×17): qty 1

## 2021-09-24 MED ORDER — MORPHINE SULFATE (PF) 2 MG/ML IV SOLN
2.0000 mg | INTRAVENOUS | Status: DC | PRN
  Administered 2021-09-24 – 2021-09-25 (×4): 2 mg via INTRAVENOUS
  Filled 2021-09-24 (×4): qty 1

## 2021-09-24 MED ORDER — INSULIN ASPART 100 UNIT/ML IJ SOLN: 0.0000 [IU] | Freq: Every day | INTRAMUSCULAR | Status: DC

## 2021-09-24 MED ORDER — BISACODYL 5 MG PO TBEC
5.0000 mg | DELAYED_RELEASE_TABLET | Freq: Every day | ORAL | Status: DC | PRN
  Administered 2021-09-26 – 2021-10-10 (×2): 5 mg via ORAL
  Filled 2021-09-24 (×2): qty 1

## 2021-09-24 MED ORDER — INSULIN ASPART 100 UNIT/ML IJ SOLN
0.0000 [IU] | Freq: Three times a day (TID) | INTRAMUSCULAR | Status: DC
  Administered 2021-09-30 – 2021-10-01 (×3): 3 [IU] via SUBCUTANEOUS
  Administered 2021-10-02: 2 [IU] via SUBCUTANEOUS
  Administered 2021-10-03: 3 [IU] via SUBCUTANEOUS
  Administered 2021-10-03: 2 [IU] via SUBCUTANEOUS
  Administered 2021-10-04: 09:00:00 3 [IU] via SUBCUTANEOUS
  Administered 2021-10-05 (×2): 2 [IU] via SUBCUTANEOUS
  Administered 2021-10-06: 17:00:00 3 [IU] via SUBCUTANEOUS
  Administered 2021-10-07: 13:00:00 2 [IU] via SUBCUTANEOUS
  Administered 2021-10-07: 17:00:00 3 [IU] via SUBCUTANEOUS
  Administered 2021-10-08 – 2021-10-09 (×2): 2 [IU] via SUBCUTANEOUS

## 2021-09-24 MED ORDER — SODIUM CHLORIDE 0.9% FLUSH
3.0000 mL | Freq: Two times a day (BID) | INTRAVENOUS | Status: DC
  Administered 2021-09-24 – 2021-10-10 (×30): 3 mL via INTRAVENOUS

## 2021-09-24 MED ORDER — POLYETHYLENE GLYCOL 3350 17 G PO PACK
17.0000 g | PACK | Freq: Every day | ORAL | Status: DC | PRN
  Administered 2021-09-25: 17 g via ORAL
  Filled 2021-09-24: qty 1

## 2021-09-24 MED ORDER — ACETAMINOPHEN 650 MG RE SUPP: 650.0000 mg | Freq: Four times a day (QID) | RECTAL | Status: DC | PRN

## 2021-09-24 MED ORDER — LACTATED RINGERS IV SOLN: INTRAVENOUS | Status: DC

## 2021-09-24 MED ORDER — HYDRALAZINE HCL 20 MG/ML IJ SOLN: 5.0000 mg | INTRAMUSCULAR | Status: DC | PRN

## 2021-09-24 MED ORDER — IOHEXOL 350 MG/ML SOLN
75.0000 mL | Freq: Once | INTRAVENOUS | Status: AC | PRN
  Administered 2021-09-24: 75 mL via INTRAVENOUS

## 2021-09-24 MED ORDER — DEXTROSE 50 % IV SOLN
INTRAVENOUS | Status: AC
  Filled 2021-09-24: qty 50

## 2021-09-24 NOTE — ED Triage Notes (Signed)
From home for nausea/vomiting. Was seen last week for pain left neck with numbness tingking arms and hands. Still with numbness/tingling bilateral arms and hands. Is paraplegic

## 2021-09-24 NOTE — ED Notes (Signed)
Observed bed sore on pt's backside when performing pericare. Positioned pt onto pt's left side, and notified RN of positioning and bed sore.
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
## 2021-09-24 NOTE — ED Notes (Addendum)
2 open sacral wounds noted, one superior to the other. Patient states she has an appointment with wound MD in January and that they are looking for an earlier appointment.
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
## 2021-09-25 ENCOUNTER — Other Ambulatory Visit: Payer: Self-pay | Admitting: Neurosurgery

## 2021-09-25 ENCOUNTER — Inpatient Hospital Stay (HOSPITAL_COMMUNITY): Payer: Medicare Other

## 2021-09-25 DIAGNOSIS — M546 Pain in thoracic spine: Secondary | ICD-10-CM

## 2021-09-25 DIAGNOSIS — B20 Human immunodeficiency virus [HIV] disease: Secondary | ICD-10-CM

## 2021-09-25 DIAGNOSIS — E119 Type 2 diabetes mellitus without complications: Secondary | ICD-10-CM

## 2021-09-25 HISTORY — PX: IR US GUIDE BX ASP/DRAIN: IMG2392

## 2021-09-25 LAB — CBC
HCT: 31.4 % — ABNORMAL LOW (ref 36.0–46.0)
Hemoglobin: 10.2 g/dL — ABNORMAL LOW (ref 12.0–15.0)
MCH: 26.4 pg (ref 26.0–34.0)
MCHC: 32.5 g/dL (ref 30.0–36.0)
MCV: 81.1 fL (ref 80.0–100.0)
Platelets: 320 10*3/uL (ref 150–400)
RBC: 3.87 MIL/uL (ref 3.87–5.11)
RDW: 15.1 % (ref 11.5–15.5)
WBC: 5.9 10*3/uL (ref 4.0–10.5)
nRBC: 0 % (ref 0.0–0.2)

## 2021-09-25 LAB — GLUCOSE, CAPILLARY
Glucose-Capillary: 116 mg/dL — ABNORMAL HIGH (ref 70–99)
Glucose-Capillary: 120 mg/dL — ABNORMAL HIGH (ref 70–99)
Glucose-Capillary: 164 mg/dL — ABNORMAL HIGH (ref 70–99)
Glucose-Capillary: 53 mg/dL — ABNORMAL LOW (ref 70–99)
Glucose-Capillary: 83 mg/dL (ref 70–99)
Glucose-Capillary: 88 mg/dL (ref 70–99)
Glucose-Capillary: 89 mg/dL (ref 70–99)

## 2021-09-25 LAB — HEMOGLOBIN A1C
Hgb A1c MFr Bld: 6 % — ABNORMAL HIGH (ref 4.8–5.6)
Mean Plasma Glucose: 125.5 mg/dL

## 2021-09-25 LAB — BASIC METABOLIC PANEL
Anion gap: 7 (ref 5–15)
BUN: 6 mg/dL (ref 6–20)
CO2: 25 mmol/L (ref 22–32)
Calcium: 8.6 mg/dL — ABNORMAL LOW (ref 8.9–10.3)
Chloride: 104 mmol/L (ref 98–111)
Creatinine, Ser: 0.43 mg/dL — ABNORMAL LOW (ref 0.44–1.00)
GFR, Estimated: >60 mL/min (ref >60)
Glucose, Bld: 83 mg/dL (ref 70–99)
Potassium: 3.7 mmol/L (ref 3.5–5.1)
Sodium: 136 mmol/L (ref 135–145)

## 2021-09-25 MED ORDER — DEXTROSE 50 % IV SOLN: 25.0000 mL | Freq: Once | INTRAVENOUS | Status: AC

## 2021-09-25 MED ORDER — VANCOMYCIN HCL IN DEXTROSE 1-5 GM/200ML-% IV SOLN
1000.0000 mg | Freq: Two times a day (BID) | INTRAVENOUS | Status: DC
  Administered 2021-09-26 – 2021-09-27 (×3): 1000 mg via INTRAVENOUS
  Filled 2021-09-25 (×5): qty 200

## 2021-09-25 MED ORDER — SODIUM CHLORIDE 0.9% FLUSH
10.0000 mL | Freq: Two times a day (BID) | INTRAVENOUS | Status: DC
  Administered 2021-09-26 – 2021-09-29 (×6): 10 mL
  Administered 2021-09-29: 20 mL
  Administered 2021-09-29 – 2021-10-04 (×10): 10 mL
  Administered 2021-10-05: 08:00:00 20 mL
  Administered 2021-10-07: 19:00:00 40 mL
  Administered 2021-10-07: 06:00:00 10 mL

## 2021-09-25 MED ORDER — LIDOCAINE HCL 1 % IJ SOLN
INTRAMUSCULAR | Status: AC
  Administered 2021-09-25: 10 mL
  Filled 2021-09-25: qty 20

## 2021-09-25 MED ORDER — MIDAZOLAM HCL 2 MG/2ML IJ SOLN
INTRAMUSCULAR | Status: AC | PRN
  Administered 2021-09-25: .5 mg via INTRAVENOUS

## 2021-09-25 MED ORDER — DEXTROSE 50 % IV SOLN
INTRAVENOUS | Status: AC
  Filled 2021-09-25: qty 50

## 2021-09-25 MED ORDER — MORPHINE SULFATE (PF) 4 MG/ML IV SOLN
4.0000 mg | INTRAVENOUS | Status: DC | PRN
  Administered 2021-09-25 – 2021-09-26 (×5): 4 mg via INTRAVENOUS
  Filled 2021-09-25 (×6): qty 1

## 2021-09-25 MED ORDER — SODIUM CHLORIDE 0.9% FLUSH: 10.0000 mL | INTRAVENOUS | Status: DC | PRN

## 2021-09-25 MED ORDER — DEXTROSE-NACL 5-0.45 % IV SOLN
INTRAVENOUS | Status: DC
Start: 2021-09-25 — End: 2021-09-25

## 2021-09-25 MED ORDER — VANCOMYCIN HCL 1500 MG/300ML IV SOLN
1500.0000 mg | Freq: Once | INTRAVENOUS | Status: AC
  Administered 2021-09-25: 1500 mg via INTRAVENOUS
  Filled 2021-09-25: qty 300

## 2021-09-25 MED ORDER — PIPERACILLIN-TAZOBACTAM 3.375 G IVPB
3.3750 g | Freq: Three times a day (TID) | INTRAVENOUS | Status: DC
  Administered 2021-09-25 – 2021-09-30 (×14): 3.375 g via INTRAVENOUS
  Filled 2021-09-25 (×17): qty 50

## 2021-09-25 MED ORDER — FENTANYL CITRATE (PF) 100 MCG/2ML IJ SOLN
INTRAMUSCULAR | Status: AC
  Filled 2021-09-25: qty 2

## 2021-09-25 MED ORDER — FENTANYL CITRATE (PF) 100 MCG/2ML IJ SOLN
INTRAMUSCULAR | Status: AC | PRN
  Administered 2021-09-25: 25 ug via INTRAVENOUS

## 2021-09-25 MED ORDER — MIDAZOLAM HCL 2 MG/2ML IJ SOLN
INTRAMUSCULAR | Status: AC
  Filled 2021-09-25: qty 2

## 2021-09-25 MED ORDER — SODIUM CHLORIDE 0.9 % IV SOLN: INTRAVENOUS | Status: AC

## 2021-09-25 NOTE — Progress Notes (Signed)
Orthopedic Tech Progress Note Patient Details:  [facility] Lane 1966-09-14 [id number]  Patient ID: [name], female   DOB: 02/04/1966, 55 y.o.   MRN: [id number]  [name] [name] 09/25/2021, 8:13 AM Called [employer] to order brace.

## 2021-09-25 NOTE — Procedures (Signed)
Interventional Radiology Procedure Note  Procedure: 10.2 fr drain placed in left paraspinal abscess using US guidance  Indication: Left paraspinal abscess  Findings: Please refer to procedural dictation for full description.  Complications: None  EBL: < 10 mL  Miachel Roux, MD [phone]

## 2021-09-25 NOTE — Plan of Care (Signed)

## 2021-09-25 NOTE — Plan of Care (Signed)
Pt is alert oriented x 4. Pt is paraplegic, Q2 turn. NPO for IR procedure tomorrow. Pt c/o pain to back, prn morphine given with effective results.    Problem: Education: Goal: Knowledge of General Education information will improve Description: Including pain rating scale, medication(s)/side effects and non-pharmacologic comfort measures Outcome: Progressing   Problem: Health Behavior/Discharge Planning: Goal: Ability to manage health-related needs will improve Outcome: Progressing   Problem: Clinical Measurements: Goal: Ability to maintain clinical measurements within normal limits will improve Outcome: Progressing Goal: Will remain free from infection Outcome: Progressing Goal: Diagnostic test results will improve Outcome: Progressing Goal: Respiratory complications will improve Outcome: Progressing Goal: Cardiovascular complication will be avoided Outcome: Progressing   Problem: Activity: Goal: Risk for activity intolerance will decrease Outcome: Progressing   Problem: Nutrition: Goal: Adequate nutrition will be maintained Outcome: Progressing   Problem: Coping: Goal: Level of anxiety will decrease Outcome: Progressing   Problem: Elimination: Goal: Will not experience complications related to bowel motility Outcome: Progressing Goal: Will not experience complications related to urinary retention Outcome: Progressing   Problem: Pain Managment: Goal: General experience of comfort will improve Outcome: Progressing   Problem: Safety: Goal: Ability to remain free from injury will improve Outcome: Progressing   Problem: Skin Integrity: Goal: Risk for impaired skin integrity will decrease Outcome: Progressing

## 2021-09-25 NOTE — Plan of Care (Signed)
Pt is alert oriented x 4. Pt c/o pain to back, 9/10 stabbing and throbbing pain. I&) cath completed every 5 hours per order. Pt bas been turned and dried Q2. Pt has purewick in place. Neurosurgeon in to see pt this morning to explain procedure in IR and also to discuss surgery.   Problem: Education: Goal: Knowledge of General Education information will improve Description: Including pain rating scale, medication(s)/side effects and non-pharmacologic comfort measures 09/25/2021 0814 by Allayne Stack, RN Outcome: Progressing 09/25/2021 0213 by Allayne Stack, RN Outcome: Progressing   Problem: Health Behavior/Discharge Planning: Goal: Ability to manage health-related needs will improve 09/25/2021 0814 by Allayne Stack, RN Outcome: Progressing 09/25/2021 0213 by Allayne Stack, RN Outcome: Progressing   Problem: Clinical Measurements: Goal: Ability to maintain clinical measurements within normal limits will improve 09/25/2021 0814 by Allayne Stack, RN Outcome: Progressing 09/25/2021 0213 by Allayne Stack, RN Outcome: Progressing Goal: Will remain free from infection 09/25/2021 0814 by Allayne Stack, RN Outcome: Progressing 09/25/2021 0213 by Allayne Stack, RN Outcome: Progressing Goal: Diagnostic test results will improve 09/25/2021 0814 by Allayne Stack, RN Outcome: Progressing 09/25/2021 0213 by Allayne Stack, RN Outcome: Progressing Goal: Respiratory complications will improve 09/25/2021 0814 by Allayne Stack, RN Outcome: Progressing 09/25/2021 0213 by Allayne Stack, RN Outcome: Progressing Goal: Cardiovascular complication will be avoided 09/25/2021 0814 by Allayne Stack, RN Outcome: Progressing 09/25/2021 0213 by Allayne Stack, RN Outcome: Progressing   Problem: Activity: Goal: Risk for activity intolerance will decrease 09/25/2021 0814 by Allayne Stack, RN Outcome: Progressing 09/25/2021 0213 by Allayne Stack, RN Outcome: Progressing   Problem: Nutrition: Goal: Adequate nutrition will be maintained 09/25/2021 0814 by Allayne Stack, RN Outcome: Progressing 09/25/2021 0213 by Allayne Stack, RN Outcome: Progressing   Problem: Coping: Goal: Level of anxiety will decrease 09/25/2021 0814 by Allayne Stack, RN Outcome: Progressing 09/25/2021 0213 by Allayne Stack, RN Outcome: Progressing   Problem: Elimination: Goal: Will not experience complications related to bowel motility 09/25/2021 0814 by Allayne Stack, RN Outcome: Progressing 09/25/2021 0213 by Allayne Stack, RN Outcome: Progressing Goal: Will not experience complications related to urinary retention 09/25/2021 0814 by Allayne Stack, RN Outcome: Progressing 09/25/2021 0213 by Allayne Stack, RN Outcome: Progressing   Problem: Pain Managment: Goal: General experience of comfort will improve 09/25/2021 0814 by Allayne Stack, RN Outcome: Progressing 09/25/2021 0213 by Allayne Stack, RN Outcome: Progressing   Problem: Safety: Goal: Ability to remain free from injury will improve 09/25/2021 0814 by Allayne Stack, RN Outcome: Progressing 09/25/2021 0213 by Allayne Stack, RN Outcome: Progressing   Problem: Skin Integrity: Goal: Risk for impaired skin integrity will decrease 09/25/2021 0814 by Allayne Stack, RN Outcome: Progressing 09/25/2021 0213 by Allayne Stack, RN Outcome: Progressing

## 2021-09-25 NOTE — Progress Notes (Signed)
Pt blood sugar 53, gave 25 mg dextrose per protocol, 15 min. Later blood sugar is 164, MD [place] Pink informed, and MD directed this RN not to treat. Will continue to monitor.
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
## 2021-09-25 NOTE — Progress Notes (Signed)
A midline has been ordered for this patient. The [facility] RN has reviewed the patient's medical record including any arm restrictions, current creatinine clearance, length IV therapy is needed, and infusions needed/ordered to determine if a midline is the appropriate line for this individual patient. If there are contraindications, the physician and primary RN has been contacted by [facility] RN for further discussion. Midline Education: a midline is a long peripheral IV placed in the upper arm with the tip located at or near the axilla and distal to the shoulder should not be used as a CLABSI preventative measure   it has one lumen only it can remain in place for up to 29 days  Safe for Vancomycin infusion LESS THAN 6 days Is safe for power injection if good blood return can be obtained and line flushes easily it CANNOT be used for continuous infusion of vesicants. Including TPN and chemotherapy Should NOT be placed for sole intent of obtaining labs as there is no guarantee blood can be successfully drawn from line  contraindicated in patients with thrombosis, hypercoagulability, decreased venous flow to the extremities, ESRD (without a nephrologist's approval), small vessels allergy to polyurethane, or known/suspected presence of a device-related infection, bacteremia, or septicemia

## 2021-09-25 NOTE — Progress Notes (Signed)
Pt returned from IR, vitals WNL, blood sugar 88, In & Out Cath performed per order, 300 mL output, patient resting comfortably.
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
## 2021-09-26 DIAGNOSIS — J90 Pleural effusion, not elsewhere classified: Secondary | ICD-10-CM

## 2021-09-26 LAB — CBC
HCT: 27.6 % — ABNORMAL LOW (ref 36.0–46.0)
Hemoglobin: 9.2 g/dL — ABNORMAL LOW (ref 12.0–15.0)
MCH: 26.7 pg (ref 26.0–34.0)
MCHC: 33.3 g/dL (ref 30.0–36.0)
MCV: 80.2 fL (ref 80.0–100.0)
Platelets: 267 10*3/uL (ref 150–400)
RBC: 3.44 MIL/uL — ABNORMAL LOW (ref 3.87–5.11)
RDW: 14.9 % (ref 11.5–15.5)
WBC: 5.4 10*3/uL (ref 4.0–10.5)
nRBC: 0 % (ref 0.0–0.2)

## 2021-09-26 LAB — GLUCOSE, CAPILLARY
Glucose-Capillary: 96 mg/dL (ref 70–99)
Glucose-Capillary: 95 mg/dL (ref 70–99)
Glucose-Capillary: 105 mg/dL — ABNORMAL HIGH (ref 70–99)
Glucose-Capillary: 101 mg/dL — ABNORMAL HIGH (ref 70–99)
Glucose-Capillary: 149 mg/dL — ABNORMAL HIGH (ref 70–99)

## 2021-09-26 LAB — BASIC METABOLIC PANEL
Anion gap: 3 — ABNORMAL LOW (ref 5–15)
BUN: 5 mg/dL — ABNORMAL LOW (ref 6–20)
CO2: 26 mmol/L (ref 22–32)
Calcium: 8.4 mg/dL — ABNORMAL LOW (ref 8.9–10.3)
Chloride: 105 mmol/L (ref 98–111)
Creatinine, Ser: 0.55 mg/dL (ref 0.44–1.00)
GFR, Estimated: >60 mL/min (ref >60)
Glucose, Bld: 100 mg/dL — ABNORMAL HIGH (ref 70–99)
Potassium: 3.9 mmol/L (ref 3.5–5.1)
Sodium: 134 mmol/L — ABNORMAL LOW (ref 135–145)

## 2021-09-26 LAB — VITAMIN B12: Vitamin B-12: 730 pg/mL (ref 180–914)

## 2021-09-26 LAB — RETICULOCYTES
Immature Retic Fract: 12.9 % (ref 2.3–15.9)
RBC.: 3.5 MIL/uL — ABNORMAL LOW (ref 3.87–5.11)
Retic Count, Absolute: 68.3 10*3/uL (ref 19.0–186.0)
Retic Ct Pct: 2 % (ref 0.4–3.1)

## 2021-09-26 LAB — IRON AND TIBC
Iron: 45 ug/dL (ref 28–170)
Saturation Ratios: 20 % (ref 10.4–31.8)
TIBC: 225 ug/dL — ABNORMAL LOW (ref 250–450)
UIBC: 180 ug/dL

## 2021-09-26 LAB — FERRITIN: Ferritin: 83 ng/mL (ref 11–307)

## 2021-09-26 LAB — FOLATE: Folate: 16.9 ng/mL (ref >5.9)

## 2021-09-26 LAB — MAGNESIUM: Magnesium: 1.2 mg/dL — ABNORMAL LOW (ref 1.7–2.4)

## 2021-09-26 MED ORDER — SODIUM CHLORIDE 0.9% FLUSH
5.0000 mL | Freq: Three times a day (TID) | INTRAVENOUS | Status: DC
  Administered 2021-09-26 – 2021-10-07 (×28): 5 mL

## 2021-09-26 MED ORDER — HEPARIN SODIUM (PORCINE) 5000 UNIT/ML IJ SOLN
5000.0000 [IU] | Freq: Three times a day (TID) | INTRAMUSCULAR | Status: DC
  Administered 2021-09-26 – 2021-10-10 (×44): 5000 [IU] via SUBCUTANEOUS
  Filled 2021-09-26 (×44): qty 1

## 2021-09-26 MED ORDER — ENOXAPARIN SODIUM 40 MG/0.4ML IJ SOSY: 40.0000 mg | PREFILLED_SYRINGE | INTRAMUSCULAR | Status: DC

## 2021-09-26 MED ORDER — SENNOSIDES-DOCUSATE SODIUM 8.6-50 MG PO TABS
2.0000 | ORAL_TABLET | Freq: Two times a day (BID) | ORAL | Status: DC
  Administered 2021-09-26 – 2021-10-10 (×28): 2 via ORAL
  Filled 2021-09-26 (×29): qty 2

## 2021-09-26 MED ORDER — SENNOSIDES-DOCUSATE SODIUM 8.6-50 MG PO TABS
1.0000 | ORAL_TABLET | Freq: Once | ORAL | Status: AC
  Administered 2021-09-26: 1 via ORAL
  Filled 2021-09-26: qty 1

## 2021-09-26 MED ORDER — POLYETHYLENE GLYCOL 3350 17 G PO PACK
17.0000 g | PACK | Freq: Every day | ORAL | Status: DC
  Administered 2021-09-26 – 2021-10-10 (×13): 17 g via ORAL
  Filled 2021-09-26 (×14): qty 1

## 2021-09-26 MED ORDER — HYDROMORPHONE HCL 1 MG/ML IJ SOLN
1.0000 mg | INTRAMUSCULAR | Status: DC | PRN
  Administered 2021-09-26 – 2021-10-10 (×41): 1 mg via INTRAVENOUS
  Filled 2021-09-26 (×42): qty 1
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
## 2021-09-27 ENCOUNTER — Other Ambulatory Visit (HOSPITAL_COMMUNITY): Payer: Medicare Other

## 2021-09-27 ENCOUNTER — Inpatient Hospital Stay (HOSPITAL_COMMUNITY): Payer: Medicare Other

## 2021-09-27 LAB — GLUCOSE, CAPILLARY
Glucose-Capillary: 95 mg/dL (ref 70–99)
Glucose-Capillary: 109 mg/dL — ABNORMAL HIGH (ref 70–99)
Glucose-Capillary: 112 mg/dL — ABNORMAL HIGH (ref 70–99)

## 2021-09-27 LAB — BASIC METABOLIC PANEL
Anion gap: 4 — ABNORMAL LOW (ref 5–15)
BUN: 6 mg/dL (ref 6–20)
CO2: 27 mmol/L (ref 22–32)
Calcium: 8.5 mg/dL — ABNORMAL LOW (ref 8.9–10.3)
Chloride: 105 mmol/L (ref 98–111)
Creatinine, Ser: 0.57 mg/dL (ref 0.44–1.00)
GFR, Estimated: >60 mL/min (ref >60)
Glucose, Bld: 109 mg/dL — ABNORMAL HIGH (ref 70–99)
Potassium: 3.9 mmol/L (ref 3.5–5.1)
Sodium: 136 mmol/L (ref 135–145)

## 2021-09-27 LAB — CBC
HCT: 27.2 % — ABNORMAL LOW (ref 36.0–46.0)
Hemoglobin: 9.1 g/dL — ABNORMAL LOW (ref 12.0–15.0)
MCH: 27 pg (ref 26.0–34.0)
MCHC: 33.5 g/dL (ref 30.0–36.0)
MCV: 80.7 fL (ref 80.0–100.0)
Platelets: 289 10*3/uL (ref 150–400)
RBC: 3.37 MIL/uL — ABNORMAL LOW (ref 3.87–5.11)
RDW: 15.3 % (ref 11.5–15.5)
WBC: 5.5 10*3/uL (ref 4.0–10.5)
nRBC: 0 % (ref 0.0–0.2)

## 2021-09-27 LAB — URINE CULTURE: Culture: >=100000 — AB

## 2021-09-27 LAB — VANCOMYCIN, TROUGH: Vancomycin Tr: 11 ug/mL — ABNORMAL LOW (ref 15–20)

## 2021-09-27 MED ORDER — FLEET ENEMA 7-19 GM/118ML RE ENEM: 1.0000 | ENEMA | Freq: Every day | RECTAL | Status: DC | PRN

## 2021-09-27 MED ORDER — BISACODYL 10 MG RE SUPP
10.0000 mg | Freq: Once | RECTAL | Status: AC
  Administered 2021-09-27: 10 mg via RECTAL
  Filled 2021-09-27: qty 1

## 2021-09-27 MED ORDER — VANCOMYCIN HCL 1250 MG/250ML IV SOLN
1250.0000 mg | Freq: Two times a day (BID) | INTRAVENOUS | Status: DC
Start: 2021-09-27 — End: 2021-09-30
  Administered 2021-09-27: 1250 mg via INTRAVENOUS
  Administered 2021-09-28: 1000 mg via INTRAVENOUS
  Administered 2021-09-28 – 2021-09-30 (×5): 1250 mg via INTRAVENOUS
  Filled 2021-09-27 (×8): qty 250
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
## 2021-09-27 NOTE — Plan of Care (Signed)
Pt is alert oriented x 4. Pt is bedrest. Pt is paraplegic, Q2 turn. Pt c/o pain to bilateral arms numbness and tingling and also pain to upper back. Dilaudid given per order with effective results. Pt states dilaudid brings her pain down to a 5. I& caths completed. Dressing to sacrum changed to areas  Dressing to JP drain site changed per order and has also been flushed, serosanguinous. Pt has not had a bowel movement since 12/8. Pt has had miralax, sennakot. Pt has been complaining of nausea when attempting to eat. PRN zofran given this morning.   Problem: Education: Goal: Knowledge of General Education information will improve Description: Including pain rating scale, medication(s)/side effects and non-pharmacologic comfort measures Outcome: Progressing   Problem: Health Behavior/Discharge Planning: Goal: Ability to manage health-related needs will improve Outcome: Progressing   Problem: Clinical Measurements: Goal: Ability to maintain clinical measurements within normal limits will improve Outcome: Progressing Goal: Will remain free from infection Outcome: Progressing Goal: Diagnostic test results will improve Outcome: Progressing Goal: Respiratory complications will improve Outcome: Progressing Goal: Cardiovascular complication will be avoided Outcome: Progressing   Problem: Activity: Goal: Risk for activity intolerance will decrease Outcome: Progressing   Problem: Nutrition: Goal: Adequate nutrition will be maintained Outcome: Progressing   Problem: Coping: Goal: Level of anxiety will decrease Outcome: Progressing   Problem: Elimination: Goal: Will not experience complications related to bowel motility Outcome: Progressing Goal: Will not experience complications related to urinary retention Outcome: Progressing   Problem: Pain Managment: Goal: General experience of comfort will improve Outcome: Progressing   Problem: Safety: Goal: Ability to remain free from injury  will improve Outcome: Progressing   Problem: Skin Integrity: Goal: Risk for impaired skin integrity will decrease Outcome: Progressing
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
## 2021-09-28 ENCOUNTER — Encounter (HOSPITAL_COMMUNITY)
Admission: EM | Disposition: A | Discharge status: Rehabilitation Facility | Payer: Self-pay | Source: Home / Self Care | Attending: Internal Medicine

## 2021-09-28 ENCOUNTER — Inpatient Hospital Stay (HOSPITAL_COMMUNITY): Payer: Medicare Other

## 2021-09-28 ENCOUNTER — Inpatient Hospital Stay (HOSPITAL_COMMUNITY): Payer: Medicare Other | Admitting: Anesthesiology

## 2021-09-28 ENCOUNTER — Encounter (HOSPITAL_COMMUNITY): Payer: Self-pay | Admitting: Internal Medicine

## 2021-09-28 DIAGNOSIS — R0609 Other forms of dyspnea: Secondary | ICD-10-CM

## 2021-09-28 HISTORY — PX: THORACIC LAMINECTOMY FOR EPIDURAL ABSCESS: SHX6115

## 2021-09-28 LAB — BASIC METABOLIC PANEL
Anion gap: 6 (ref 5–15)
BUN: <5 mg/dL — ABNORMAL LOW (ref 6–20)
CO2: 27 mmol/L (ref 22–32)
Calcium: 8.5 mg/dL — ABNORMAL LOW (ref 8.9–10.3)
Chloride: 105 mmol/L (ref 98–111)
Creatinine, Ser: 0.56 mg/dL (ref 0.44–1.00)
GFR, Estimated: >60 mL/min (ref >60)
Glucose, Bld: 92 mg/dL (ref 70–99)
Potassium: 3.7 mmol/L (ref 3.5–5.1)
Sodium: 138 mmol/L (ref 135–145)

## 2021-09-28 LAB — CBC
HCT: 26.9 % — ABNORMAL LOW (ref 36.0–46.0)
Hemoglobin: 8.9 g/dL — ABNORMAL LOW (ref 12.0–15.0)
MCH: 26.6 pg (ref 26.0–34.0)
MCHC: 33.1 g/dL (ref 30.0–36.0)
MCV: 80.3 fL (ref 80.0–100.0)
Platelets: 273 10*3/uL (ref 150–400)
RBC: 3.35 MIL/uL — ABNORMAL LOW (ref 3.87–5.11)
RDW: 15.4 % (ref 11.5–15.5)
WBC: 5.5 10*3/uL (ref 4.0–10.5)
nRBC: 0 % (ref 0.0–0.2)

## 2021-09-28 LAB — ECHOCARDIOGRAM COMPLETE
Area-P 1/2: 3.63 cm2
Height: 62 in
S' Lateral: 1.9 cm
Weight: 1954.16 oz

## 2021-09-28 LAB — GLUCOSE, CAPILLARY
Glucose-Capillary: 93 mg/dL (ref 70–99)
Glucose-Capillary: 104 mg/dL — ABNORMAL HIGH (ref 70–99)

## 2021-09-28 SURGERY — THORACIC LAMINECTOMY FOR EPIDURAL ABSCESS
Anesthesia: General | Site: Spine Thoracic | Laterality: Left

## 2021-09-28 MED ORDER — FENTANYL CITRATE (PF) 100 MCG/2ML IJ SOLN
INTRAMUSCULAR | Status: DC | PRN
  Administered 2021-09-28: 100 ug via INTRAVENOUS
  Administered 2021-09-28: 50 ug via INTRAVENOUS

## 2021-09-28 MED ORDER — SCOPOLAMINE 1 MG/3DAYS TD PT72
1.0000 | MEDICATED_PATCH | TRANSDERMAL | Status: AC
  Administered 2021-09-28: 1.5 mg via TRANSDERMAL
  Filled 2021-09-28 (×2): qty 1

## 2021-09-28 MED ORDER — BACITRACIN ZINC 500 UNIT/GM EX OINT
TOPICAL_OINTMENT | CUTANEOUS | Status: AC
  Filled 2021-09-28: qty 28.35

## 2021-09-28 MED ORDER — EPHEDRINE 5 MG/ML INJ
INTRAVENOUS | Status: AC
  Filled 2021-09-28: qty 5

## 2021-09-28 MED ORDER — THROMBIN 5000 UNITS EX SOLR
CUTANEOUS | Status: AC
  Filled 2021-09-28: qty 5000

## 2021-09-28 MED ORDER — MEPERIDINE HCL 25 MG/ML IJ SOLN: 6.2500 mg | INTRAMUSCULAR | Status: DC | PRN

## 2021-09-28 MED ORDER — PROMETHAZINE HCL 25 MG/ML IJ SOLN: 6.2500 mg | INTRAMUSCULAR | Status: DC | PRN

## 2021-09-28 MED ORDER — THROMBIN 20000 UNITS EX SOLR
CUTANEOUS | Status: AC
  Filled 2021-09-28: qty 20000

## 2021-09-28 MED ORDER — 0.9 % SODIUM CHLORIDE (POUR BTL) OPTIME
TOPICAL | Status: DC | PRN
  Administered 2021-09-28: 1000 mL

## 2021-09-28 MED ORDER — BACITRACIN ZINC 500 UNIT/GM EX OINT
TOPICAL_OINTMENT | CUTANEOUS | Status: DC | PRN
  Administered 2021-09-28: 1 via TOPICAL

## 2021-09-28 MED ORDER — THROMBIN 5000 UNITS EX SOLR: OROMUCOSAL | Status: DC | PRN

## 2021-09-28 MED ORDER — MIDAZOLAM HCL 2 MG/2ML IJ SOLN
INTRAMUSCULAR | Status: DC | PRN
  Administered 2021-09-28: 1 mg via INTRAVENOUS

## 2021-09-28 MED ORDER — PROPOFOL 10 MG/ML IV BOLUS
INTRAVENOUS | Status: DC | PRN
  Administered 2021-09-28: 60 mg via INTRAVENOUS

## 2021-09-28 MED ORDER — PHENYLEPHRINE 40 MCG/ML (10ML) SYRINGE FOR IV PUSH (FOR BLOOD PRESSURE SUPPORT)
PREFILLED_SYRINGE | INTRAVENOUS | Status: DC | PRN
  Administered 2021-09-28 (×3): 80 ug via INTRAVENOUS
  Administered 2021-09-28: 40 ug via INTRAVENOUS
  Administered 2021-09-28: 120 ug via INTRAVENOUS

## 2021-09-28 MED ORDER — DEXAMETHASONE SODIUM PHOSPHATE 10 MG/ML IJ SOLN
INTRAMUSCULAR | Status: DC | PRN
  Administered 2021-09-28: 4 mg via INTRAVENOUS

## 2021-09-28 MED ORDER — BUPIVACAINE-EPINEPHRINE 0.5% -1:200000 IJ SOLN
INTRAMUSCULAR | Status: DC | PRN
  Administered 2021-09-28: 10 mL

## 2021-09-28 MED ORDER — HYDROMORPHONE HCL 1 MG/ML IJ SOLN
INTRAMUSCULAR | Status: AC
  Filled 2021-09-28: qty 1

## 2021-09-28 MED ORDER — ONDANSETRON HCL 4 MG/2ML IJ SOLN
INTRAMUSCULAR | Status: DC | PRN
  Administered 2021-09-28: 4 mg via INTRAVENOUS

## 2021-09-28 MED ORDER — ROCURONIUM BROMIDE 10 MG/ML (PF) SYRINGE
PREFILLED_SYRINGE | INTRAVENOUS | Status: DC | PRN
  Administered 2021-09-28: 60 mg via INTRAVENOUS

## 2021-09-28 MED ORDER — PROPOFOL 10 MG/ML IV BOLUS
INTRAVENOUS | Status: AC
  Filled 2021-09-28: qty 20

## 2021-09-28 MED ORDER — EPHEDRINE SULFATE-NACL 50-0.9 MG/10ML-% IV SOSY
PREFILLED_SYRINGE | INTRAVENOUS | Status: DC | PRN
  Administered 2021-09-28: 10 mg via INTRAVENOUS

## 2021-09-28 MED ORDER — LIDOCAINE-EPINEPHRINE 1 %-1:100000 IJ SOLN
INTRAMUSCULAR | Status: AC
  Filled 2021-09-28: qty 1

## 2021-09-28 MED ORDER — THROMBIN 20000 UNITS EX SOLR
CUTANEOUS | Status: DC | PRN
  Administered 2021-09-28: 20000 [IU] via TOPICAL

## 2021-09-28 MED ORDER — HYDROMORPHONE HCL 1 MG/ML IJ SOLN
0.2500 mg | INTRAMUSCULAR | Status: DC | PRN
  Administered 2021-09-28 (×4): 0.5 mg via INTRAVENOUS

## 2021-09-28 MED ORDER — ROCURONIUM BROMIDE 10 MG/ML (PF) SYRINGE
PREFILLED_SYRINGE | INTRAVENOUS | Status: AC
  Filled 2021-09-28: qty 10

## 2021-09-28 MED ORDER — ACETAMINOPHEN 500 MG PO TABS
1000.0000 mg | ORAL_TABLET | Freq: Once | ORAL | Status: AC
  Administered 2021-09-28: 1000 mg via ORAL
  Filled 2021-09-28: qty 2

## 2021-09-28 MED ORDER — MIDAZOLAM HCL 2 MG/2ML IJ SOLN: 0.5000 mg | Freq: Once | INTRAMUSCULAR | Status: DC | PRN

## 2021-09-28 MED ORDER — OXYCODONE HCL 5 MG PO TABS: 5.0000 mg | ORAL_TABLET | Freq: Once | ORAL | Status: DC | PRN

## 2021-09-28 MED ORDER — PHENYLEPHRINE 40 MCG/ML (10ML) SYRINGE FOR IV PUSH (FOR BLOOD PRESSURE SUPPORT)
PREFILLED_SYRINGE | INTRAVENOUS | Status: AC
  Filled 2021-09-28: qty 10

## 2021-09-28 MED ORDER — MICROFIBRILLAR COLL HEMOSTAT EX POWD
CUTANEOUS | Status: AC
  Filled 2021-09-28: qty 5

## 2021-09-28 MED ORDER — MIDAZOLAM HCL 2 MG/2ML IJ SOLN
INTRAMUSCULAR | Status: AC
  Filled 2021-09-28: qty 2

## 2021-09-28 MED ORDER — LACTATED RINGERS IV SOLN: INTRAVENOUS | Status: DC | PRN

## 2021-09-28 MED ORDER — FENTANYL CITRATE (PF) 250 MCG/5ML IJ SOLN
INTRAMUSCULAR | Status: AC
  Filled 2021-09-28: qty 5

## 2021-09-28 MED ORDER — BUPIVACAINE-EPINEPHRINE 0.5% -1:200000 IJ SOLN
INTRAMUSCULAR | Status: AC
  Filled 2021-09-28: qty 1

## 2021-09-28 MED ORDER — LIDOCAINE 2% (20 MG/ML) 5 ML SYRINGE
INTRAMUSCULAR | Status: AC
  Filled 2021-09-28: qty 5

## 2021-09-28 MED ORDER — OXYCODONE HCL 5 MG/5ML PO SOLN: 5.0000 mg | Freq: Once | ORAL | Status: DC | PRN

## 2021-09-28 SURGICAL SUPPLY — 75 items
APL SKNCLS STERI-STRIP NONHPOA (GAUZE/BANDAGES/DRESSINGS) ×1
BAG COUNTER SPONGE SURGICOUNT (BAG) ×3 IMPLANT
BAG SPNG CNTER NS LX DISP (BAG) ×1
BAG SURGICOUNT SPONGE COUNTING (BAG) ×1
BALL CTTN LRG ABS STRL LF (GAUZE/BANDAGES/DRESSINGS)
BAND INSRT 18 STRL LF DISP RB (MISCELLANEOUS) ×1
BAND RUBBER #18 3X1/16 STRL (MISCELLANEOUS) ×2 IMPLANT
BENZOIN TINCTURE PRP APPL 2/3 (GAUZE/BANDAGES/DRESSINGS) ×4 IMPLANT
BIT DRILL NEURO 2X3.1 SFT TUCH (MISCELLANEOUS) ×2 IMPLANT
BLADE CLIPPER SURG (BLADE) IMPLANT
BLADE SURG 11 STRL SS (BLADE) IMPLANT
BLADE ULTRA TIP 2M (BLADE) IMPLANT
BUR MATCHSTICK NEURO 3.0 LAGG (BURR) ×4 IMPLANT
BUR PRECISION FLUTE 6.0 (BURR) ×2 IMPLANT
CANISTER SUCT 3000ML PPV (MISCELLANEOUS) ×4 IMPLANT
CARTRIDGE OIL MAESTRO DRILL (MISCELLANEOUS) ×2 IMPLANT
CLIP VESOCCLUDE MED 6/CT (CLIP) IMPLANT
CLOSURE WOUND 1/2 X4 (GAUZE/BANDAGES/DRESSINGS) ×1
COTTONBALL LRG STERILE PKG (GAUZE/BANDAGES/DRESSINGS) IMPLANT
COVER MAYO STAND STRL (DRAPES) ×4 IMPLANT
DIFFUSER DRILL AIR PNEUMATIC (MISCELLANEOUS) ×4 IMPLANT
DRAPE CAMERA VIDEO/LASER (DRAPES) IMPLANT
DRAPE LAPAROTOMY 100X72 PEDS (DRAPES) IMPLANT
DRAPE LAPAROTOMY 100X72X124 (DRAPES) ×2 IMPLANT
DRAPE MICROSCOPE LEICA (MISCELLANEOUS) IMPLANT
DRAPE SURG 17X23 STRL (DRAPES) ×16 IMPLANT
DRILL NEURO 2X3.1 SOFT TOUCH (MISCELLANEOUS) ×3
DRSG OPSITE POSTOP 4X8 (GAUZE/BANDAGES/DRESSINGS) ×2 IMPLANT
ELECT REM PT RETURN 9FT ADLT (ELECTROSURGICAL) ×3
ELECTRODE REM PT RTRN 9FT ADLT (ELECTROSURGICAL) ×2 IMPLANT
EVACUATOR SILICONE 100CC (DRAIN) IMPLANT
GAUZE 4X4 16PLY ~~LOC~~+RFID DBL (SPONGE) IMPLANT
GAUZE SPONGE 4X4 12PLY STRL (GAUZE/BANDAGES/DRESSINGS) ×4 IMPLANT
GLOVE EXAM NITRILE XL STR (GLOVE) IMPLANT
GLOVE SURG ENC MOIS LTX SZ8 (GLOVE) ×4 IMPLANT
GLOVE SURG ENC MOIS LTX SZ8.5 (GLOVE) ×4 IMPLANT
GOWN STRL REUS W/ TWL LRG LVL3 (GOWN DISPOSABLE) IMPLANT
GOWN STRL REUS W/ TWL XL LVL3 (GOWN DISPOSABLE) IMPLANT
GOWN STRL REUS W/TWL LRG LVL3 (GOWN DISPOSABLE) ×3
GOWN STRL REUS W/TWL XL LVL3 (GOWN DISPOSABLE) ×9
KIT BASIN OR (CUSTOM PROCEDURE TRAY) ×4 IMPLANT
KIT TURNOVER KIT B (KITS) ×4 IMPLANT
NDL HYPO 21X1.5 SAFETY (NEEDLE) IMPLANT
NDL SPNL 18GX3.5 QUINCKE PK (NEEDLE) IMPLANT
NEEDLE HYPO 21X1.5 SAFETY (NEEDLE) IMPLANT
NEEDLE HYPO 22GX1.5 SAFETY (NEEDLE) ×4 IMPLANT
NEEDLE SPNL 18GX3.5 QUINCKE PK (NEEDLE) IMPLANT
NS IRRIG 1000ML POUR BTL (IV SOLUTION) ×4 IMPLANT
OIL CARTRIDGE MAESTRO DRILL (MISCELLANEOUS) ×3
PACK LAMINECTOMY NEURO (CUSTOM PROCEDURE TRAY) ×4 IMPLANT
PAD ARMBOARD 7.5X6 YLW CONV (MISCELLANEOUS) ×16 IMPLANT
PATTIES SURGICAL .25X.25 (GAUZE/BANDAGES/DRESSINGS) IMPLANT
PATTIES SURGICAL .5 X.5 (GAUZE/BANDAGES/DRESSINGS) ×2 IMPLANT
PATTIES SURGICAL .5 X3 (DISPOSABLE) IMPLANT
PATTIES SURGICAL 1/4 X 3 (GAUZE/BANDAGES/DRESSINGS) IMPLANT
PATTIES SURGICAL 1X1 (DISPOSABLE) ×2 IMPLANT
SEALANT ADHERUS EXTEND TIP (MISCELLANEOUS) ×2 IMPLANT
SPONGE NEURO XRAY DETECT 1X3 (DISPOSABLE) IMPLANT
SPONGE T-LAP 4X18 ~~LOC~~+RFID (SPONGE) IMPLANT
STAPLER SKIN PROX WIDE 3.9 (STAPLE) ×4 IMPLANT
STRIP CLOSURE SKIN 1/2X4 (GAUZE/BANDAGES/DRESSINGS) ×3 IMPLANT
SUT ETHILON 2 0 PSLX (SUTURE) ×2 IMPLANT
SUT ETHILON 3 0 FSL (SUTURE) ×4 IMPLANT
SUT NURALON 4 0 TR CR/8 (SUTURE) ×2 IMPLANT
SUT PROLENE 6 0 BV (SUTURE) IMPLANT
SUT SILK 3 0 TIES 17X18 (SUTURE)
SUT SILK 3-0 18XBRD TIE BLK (SUTURE) IMPLANT
SUT VIC AB 1 CT1 18XBRD ANBCTR (SUTURE) ×2 IMPLANT
SUT VIC AB 1 CT1 8-18 (SUTURE) ×6
SUT VIC AB 2-0 CP2 18 (SUTURE) ×8 IMPLANT
TAPE STRIPS DRAPE STRL (GAUZE/BANDAGES/DRESSINGS) ×2 IMPLANT
TOWEL GREEN STERILE (TOWEL DISPOSABLE) ×4 IMPLANT
TOWEL GREEN STERILE FF (TOWEL DISPOSABLE) ×4 IMPLANT
TRAY FOLEY MTR SLVR 16FR STAT (SET/KITS/TRAYS/PACK) IMPLANT
WATER STERILE IRR 1000ML POUR (IV SOLUTION) ×4 IMPLANT
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
## 2021-09-28 NOTE — Anesthesia Procedure Notes (Signed)
Procedure Name: Intubation Date/Time: 09/28/2021 7:45 AM Performed by: Moshe Salisbury, CRNA Pre-anesthesia Checklist: Patient identified, Emergency Drugs available, Suction available and Patient being monitored Patient Re-evaluated:Patient Re-evaluated prior to induction Oxygen Delivery Method: Circle System Utilized Preoxygenation: Pre-oxygenation with 100% oxygen Induction Type: IV induction Ventilation: Mask ventilation without difficulty Laryngoscope Size: Mac and 3 Grade View: Grade II Tube type: Oral Tube size: 7.5 mm Number of attempts: 1 Airway Equipment and Method: Stylet Placement Confirmation: ETT inserted through vocal cords under direct vision, positive ETCO2 and breath sounds checked- equal and bilateral Secured at: 20 cm Tube secured with: Tape Dental Injury: Teeth and Oropharynx as per pre-operative assessment

## 2021-09-28 NOTE — Transfer of Care (Signed)
Immediate Anesthesia Transfer of Care Note  Patient: Pinkey White Cuppett  Procedure(s) Performed: THORACIC TWELVE - LUMBAR THREE LAMINOTOMY FOR EPIDURAL ABSCESS (Left: Spine Thoracic)  Patient Location: PACU  Anesthesia Type:General  Level of Consciousness: drowsy and patient cooperative  Airway & Oxygen Therapy: Patient Spontanous Breathing and Patient connected to nasal cannula oxygen  Post-op Assessment: Report given to RN, Post -op Vital signs reviewed and stable and Patient moving all extremities  Post vital signs: Reviewed and stable  Last Vitals:  Vitals Value Taken Time  BP 160/88 09/28/21 1053  Temp    Pulse 69 09/28/21 1055  Resp 16 09/28/21 1058  SpO2 100 % 09/28/21 1055  Vitals shown include unvalidated device data.  Last Pain:  Vitals:   09/28/21 0658  TempSrc:   PainSc: Asleep      Patients Stated Pain Goal: 5 (57/90/38 3338)  Complications: No notable events documented.

## 2021-09-28 NOTE — Progress Notes (Signed)
Echocardiogram 2D Echocardiogram has been performed.  Oneal Deputy Averlee Swartz RDCS 09/28/2021, 3:28 PM
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
## 2021-09-28 NOTE — Plan of Care (Signed)

## 2021-09-28 NOTE — Anesthesia Postprocedure Evaluation (Signed)
Anesthesia Post Note  Patient: Ellen Lane  Procedure(s) Performed: THORACIC TWELVE - LUMBAR THREE LAMINOTOMY FOR EPIDURAL ABSCESS (Left: Spine Thoracic)     Patient location during evaluation: PACU Anesthesia Type: General Level of consciousness: awake and alert, patient cooperative and oriented Pain management: pain level controlled Vital Signs Assessment: post-procedure vital signs reviewed and stable Respiratory status: spontaneous breathing, nonlabored ventilation and respiratory function stable Cardiovascular status: blood pressure returned to baseline and stable Postop Assessment: no apparent nausea or vomiting Anesthetic complications: no   No notable events documented.  Last Vitals:  Vitals:   09/28/21 1138 09/28/21 1217  BP: 130/85 122/81  Pulse: 84 80  Resp: 12 18  Temp: 36.4 C 36.7 C  SpO2: 97% 99%    Last Pain:  Vitals:   09/28/21 1217  TempSrc: Oral  PainSc:                  Lavona Norsworthy,E. Aharon Carriere
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
## 2021-09-29 ENCOUNTER — Encounter (HOSPITAL_COMMUNITY): Payer: Self-pay | Admitting: Neurosurgery

## 2021-09-29 LAB — BASIC METABOLIC PANEL
Anion gap: 5 (ref 5–15)
BUN: 6 mg/dL (ref 6–20)
CO2: 26 mmol/L (ref 22–32)
Calcium: 8.4 mg/dL — ABNORMAL LOW (ref 8.9–10.3)
Chloride: 104 mmol/L (ref 98–111)
Creatinine, Ser: 0.57 mg/dL (ref 0.44–1.00)
GFR, Estimated: >60 mL/min (ref >60)
Glucose, Bld: 117 mg/dL — ABNORMAL HIGH (ref 70–99)
Potassium: 3.7 mmol/L (ref 3.5–5.1)
Sodium: 135 mmol/L (ref 135–145)

## 2021-09-29 LAB — CBC
HCT: 26.3 % — ABNORMAL LOW (ref 36.0–46.0)
Hemoglobin: 9 g/dL — ABNORMAL LOW (ref 12.0–15.0)
MCH: 27.5 pg (ref 26.0–34.0)
MCHC: 34.2 g/dL (ref 30.0–36.0)
MCV: 80.4 fL (ref 80.0–100.0)
Platelets: 237 10*3/uL (ref 150–400)
RBC: 3.27 MIL/uL — ABNORMAL LOW (ref 3.87–5.11)
RDW: 15.5 % (ref 11.5–15.5)
WBC: 6.8 10*3/uL (ref 4.0–10.5)
nRBC: 0 % (ref 0.0–0.2)

## 2021-09-29 LAB — GLUCOSE, CAPILLARY
Glucose-Capillary: 107 mg/dL — ABNORMAL HIGH (ref 70–99)
Glucose-Capillary: 108 mg/dL — ABNORMAL HIGH (ref 70–99)
Glucose-Capillary: 110 mg/dL — ABNORMAL HIGH (ref 70–99)
Glucose-Capillary: 160 mg/dL — ABNORMAL HIGH (ref 70–99)

## 2021-09-29 MED ORDER — BISACODYL 10 MG RE SUPP
10.0000 mg | Freq: Every day | RECTAL | Status: DC | PRN
  Administered 2021-09-29 – 2021-10-02 (×2): 10 mg via RECTAL
  Filled 2021-09-29 (×2): qty 1
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
## 2021-09-29 NOTE — Plan of Care (Signed)
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
## 2021-09-29 NOTE — Care Management Important Message (Signed)
Important Message  Patient Details  Name: Ellen Lane MRN: [id number] Date of Birth: September 03, 1966   Medicare Important Message Given:        Orbie Pyo 09/29/2021, 3:13 PM

## 2021-09-29 NOTE — Progress Notes (Signed)
Subjective: The patient is alert and pleasant.  She complains of some left arm pain and numbness last night.  Objective: Vital signs in last 24 hours: Temp:  [97.3 F (36.3 C)-99 F (37.2 C)] 98.6 F (37 C) (12/13 0738) Pulse Rate:  [66-114] 90 (12/13 0738) Resp:  [12-20] 19 (12/13 0738) BP: (90-170)/(60-94) 115/72 (12/13 0738) SpO2:  [97 %-100 %] 99 % (12/13 0738) Estimated body mass index is 22.34 kg/m as calculated from the following:   Height as of this encounter: 5\' 2"  (1.575 m).   Weight as of this encounter: 55.4 kg.   Intake/Output from previous day: 12/12 0701 - 12/13 0700 In: 1370 [P.O.:120; I.V.:1000; IV Piggyback:250] Out: 7322 [Urine:1450; Drains:60; Blood:100] Intake/Output this shift: No intake/output data recorded.  Physical exam the patient is alert and pleasant.  She is paraplegic.  She moves her upper extremities well.  Lab Results: Recent Labs    09/27/21 0204 09/28/21 0430  WBC 5.5 5.5  HGB 9.1* 8.9*  HCT 27.2* 26.9*  PLT 289 273   BMET Recent Labs    09/27/21 0204 09/28/21 0430  NA 136 138  K 3.9 3.7  CL 105 105  CO2 27 27  GLUCOSE 109* 92  BUN 6 <5*  CREATININE 0.57 0.56  CALCIUM 8.5* 8.5*    Studies/Results: DG Lumbar Spine 1 View  Result Date: 09/28/2021 CLINICAL DATA:  T10-L3 laminectomy 4 epidural abscess EXAM: LUMBAR SPINE - 1 VIEW COMPARISON:  09/24/2021 FINDINGS: Cross-table lateral view of the lumbar spine was obtained during the procedure for localization purposes. Surgical probe is seen posterior to the L2 vertebral body. Surgical drain is seen coiled within the known left paraspinal abscess. IMPRESSION: 1. Localization exam, with surgical probe posterior to the L2 vertebral body. 2. Pigtail catheter within the known left paraspinal abscess. Electronically Signed   By: Randa Ngo M.D.   On: 09/28/2021 16:57   ECHOCARDIOGRAM COMPLETE  Result Date: 09/28/2021    ECHOCARDIOGRAM REPORT   Patient Name:   [facility]  Date of Exam: 09/28/2021 Medical Rec #:  [id number]           Height:       62.0 in Accession #:    [id number]          Weight:       122.1 lb Date of Birth:  1965-10-23           BSA:          1.550 m Patient Age:    55 years            BP:           122/81 mmHg Patient Gender: F                   HR:           80 bpm. Exam Location:  Inpatient Procedure: 2D Echo, Color Doppler and Cardiac Doppler Indications:    R06.9 DOE  History:        Patient has no prior history of Echocardiogram examinations.                 Risk Factors:Diabetes, Dyslipidemia and Hypertension.  Sonographer:    Raquel Sarna Senior RDCS Referring Phys: [place]  1. Left ventricular ejection fraction, by estimation, is 65 to 70%. The left ventricle has normal function. The left ventricle has no regional wall motion abnormalities. Indeterminate diastolic filling due to E-A fusion.  2. Right ventricular  systolic function is normal. The right ventricular size is normal. Tricuspid regurgitation signal is inadequate for assessing PA pressure.  3. A small pericardial effusion is present. The pericardial effusion is posterior to the left ventricle. There is no evidence of cardiac tamponade.  4. The mitral valve is grossly normal. No evidence of mitral valve regurgitation. No evidence of mitral stenosis.  5. The aortic valve is grossly normal. Aortic valve regurgitation is not visualized. No aortic stenosis is present.  6. The inferior vena cava is normal in size with greater than 50% respiratory variability, suggesting right atrial pressure of 3 mmHg. FINDINGS  Left Ventricle: Left ventricular ejection fraction, by estimation, is 65 to 70%. The left ventricle has normal function. The left ventricle has no regional wall motion abnormalities. The left ventricular internal cavity size was normal in size. There is  no left ventricular hypertrophy. Indeterminate diastolic filling due to E-A fusion. Right Ventricle: The right ventricular  size is normal. No increase in right ventricular wall thickness. Right ventricular systolic function is normal. Tricuspid regurgitation signal is inadequate for assessing PA pressure. Left Atrium: Left atrial size was normal in size. Right Atrium: Right atrial size was normal in size. Pericardium: A small pericardial effusion is present. The pericardial effusion is posterior to the left ventricle. There is no evidence of cardiac tamponade. Mitral Valve: The mitral valve is grossly normal. No evidence of mitral valve regurgitation. No evidence of mitral valve stenosis. Tricuspid Valve: The tricuspid valve is grossly normal. Tricuspid valve regurgitation is not demonstrated. No evidence of tricuspid stenosis. Aortic Valve: The aortic valve is grossly normal. Aortic valve regurgitation is not visualized. No aortic stenosis is present. Pulmonic Valve: The pulmonic valve was grossly normal. Pulmonic valve regurgitation is not visualized. No evidence of pulmonic stenosis. Aorta: The aortic root is normal in size and structure. Venous: The inferior vena cava is normal in size with greater than 50% respiratory variability, suggesting right atrial pressure of 3 mmHg. IAS/Shunts: The atrial septum is grossly normal. Additional Comments: There is a small pleural effusion in the left lateral region.  LEFT VENTRICLE PLAX 2D LVIDd:         3.20 cm   Diastology LVIDs:         1.90 cm   LV e' medial:    6.42 cm/s LV PW:         1.00 cm   LV E/e' medial:  10.5 LV IVS:        0.80 cm   LV e' lateral:   6.42 cm/s LVOT diam:     2.00 cm   LV E/e' lateral: 10.5 LV SV:         63 LV SV Index:   41 LVOT Area:     3.14 cm  RIGHT VENTRICLE RV S prime:     18.60 cm/s TAPSE (M-mode): 2.7 cm LEFT ATRIUM             Index        RIGHT ATRIUM           Index LA diam:        2.40 cm 1.55 cm/m   RA Area:     13.60 cm LA Vol (A2C):   28.6 ml 18.45 ml/m  RA Volume:   32.40 ml  20.90 ml/m LA Vol (A4C):   24.6 ml 15.87 ml/m LA Biplane Vol: 29.1  ml 18.77 ml/m  AORTIC VALVE LVOT Vmax:   86.20 cm/s LVOT Vmean:  66.000 cm/s  LVOT VTI:    0.202 m  AORTA Ao Root diam: 2.80 cm MITRAL VALVE MV Area (PHT): 3.63 cm    SHUNTS MV Decel Time: 209 msec    Systemic VTI:  0.20 m MV E velocity: 67.70 cm/s  Systemic Diam: 2.00 cm MV A velocity: 66.00 cm/s MV E/A ratio:  1.03 Eleonore Chiquito MD Electronically signed by Eleonore Chiquito MD Signature Date/Time: 09/28/2021/5:49:11 PM    Final     Assessment/Plan: Postop day #1: The cultures are so far negative.  I did not see any obvious infection at surgery, but something destroyed her thoracic vertebral bodies.  I would suggest a infectious disease consult to help [place] sort this out.  Ultimately she is going to need a thoracolumbar instrumentation and fusion to stabilize her spine.  Left arm pain/numbness: Her cervical MRI was unremarkable.  Perhaps she has ulnar neuropathy, but further work-up of this can wait.  LOS: 5 days     [name] 09/29/2021, 8:09 AM     Patient ID: [name], female   DOB: 01/20/1966, 55 y.o.   MRN: [id number]

## 2021-09-30 LAB — CBC
HCT: 31.1 % — ABNORMAL LOW (ref 36.0–46.0)
Hemoglobin: 10 g/dL — ABNORMAL LOW (ref 12.0–15.0)
MCH: 26.7 pg (ref 26.0–34.0)
MCHC: 32.2 g/dL (ref 30.0–36.0)
MCV: 82.9 fL (ref 80.0–100.0)
Platelets: 267 10*3/uL (ref 150–400)
RBC: 3.75 MIL/uL — ABNORMAL LOW (ref 3.87–5.11)
RDW: 17.4 % — ABNORMAL HIGH (ref 11.5–15.5)
WBC: 8.7 10*3/uL (ref 4.0–10.5)
nRBC: 0 % (ref 0.0–0.2)

## 2021-09-30 LAB — GLUCOSE, CAPILLARY
Glucose-Capillary: 119 mg/dL — ABNORMAL HIGH (ref 70–99)
Glucose-Capillary: 163 mg/dL — ABNORMAL HIGH (ref 70–99)
Glucose-Capillary: 161 mg/dL — ABNORMAL HIGH (ref 70–99)
Glucose-Capillary: 87 mg/dL (ref 70–99)

## 2021-09-30 LAB — CULTURE, BLOOD (ROUTINE X 2)
Culture: NO GROWTH
Culture: NO GROWTH
Special Requests: ADEQUATE
Special Requests: ADEQUATE

## 2021-09-30 LAB — AEROBIC/ANAEROBIC CULTURE W GRAM STAIN (SURGICAL/DEEP WOUND): Culture: NO GROWTH

## 2021-09-30 LAB — BASIC METABOLIC PANEL
Anion gap: 8 (ref 5–15)
BUN: 6 mg/dL (ref 6–20)
CO2: 23 mmol/L (ref 22–32)
Calcium: 8.3 mg/dL — ABNORMAL LOW (ref 8.9–10.3)
Chloride: 104 mmol/L (ref 98–111)
Creatinine, Ser: 0.43 mg/dL — ABNORMAL LOW (ref 0.44–1.00)
GFR, Estimated: >60 mL/min (ref >60)
Glucose, Bld: 108 mg/dL — ABNORMAL HIGH (ref 70–99)
Potassium: 3.8 mmol/L (ref 3.5–5.1)
Sodium: 135 mmol/L (ref 135–145)

## 2021-09-30 LAB — VANCOMYCIN, TROUGH: Vancomycin Tr: 23 ug/mL (ref 15–20)

## 2021-09-30 MED ORDER — SODIUM CHLORIDE 0.9 % IV SOLN
2.0000 g | Freq: Three times a day (TID) | INTRAVENOUS | Status: DC
  Administered 2021-09-30 – 2021-10-10 (×32): 2 g via INTRAVENOUS
  Filled 2021-09-30 (×34): qty 2

## 2021-09-30 MED ORDER — VANCOMYCIN VARIABLE DOSE PER UNSTABLE RENAL FUNCTION (PHARMACIST DOSING)
Status: DC
Start: 2021-09-30 — End: 2021-10-01
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
## 2021-09-30 NOTE — Care Management Important Message (Signed)
Important Message  Patient Details  Name: Ellen Lane MRN: [id number] Date of Birth: 19-Jun-1966   Medicare Important Message Given:  Yes     Meziah Blasingame Montine Circle 09/30/2021, 3:23 PM

## 2021-09-30 NOTE — Plan of Care (Signed)

## 2021-09-30 NOTE — Progress Notes (Signed)
Subjective: The patient is alert and pleasant.  She is in no apparent distress.  Objective: Vital signs in last 24 hours: Temp:  [98.2 F (36.8 C)-99.9 F (37.7 C)] 98.6 F (37 C) (12/14 0724) Pulse Rate:  [88-96] 88 (12/14 0724) Resp:  [17-19] 17 (12/14 0724) BP: (110-141)/(57-86) 128/83 (12/14 0724) SpO2:  [99 %-100 %] 100 % (12/14 0724) Estimated body mass index is 22.34 kg/m as calculated from the following:   Height as of this encounter: 5\' 2"  (1.575 m).   Weight as of this encounter: 55.4 kg.   Intake/Output from previous day: 12/13 0701 - 12/14 0700 In: 10  Out: 2030 [Urine:2000; Drains:30] Intake/Output this shift: No intake/output data recorded.  Physical exam the patient is alert and pleasant.  She is moving her upper extremities well.  She is paraplegic.  Lab Results: Recent Labs    09/29/21 0915 09/30/21 0606  WBC 6.8 8.7  HGB 9.0* 10.0*  HCT 26.3* 31.1*  PLT 237 267   BMET Recent Labs    09/29/21 0915 09/30/21 0641  NA 135 135  K 3.7 3.8  CL 104 104  CO2 26 23  GLUCOSE 117* 108*  BUN 6 6  CREATININE 0.57 0.43*  CALCIUM 8.4* 8.3*    Studies/Results: ECHOCARDIOGRAM COMPLETE  Result Date: 09/28/2021    ECHOCARDIOGRAM REPORT   Patient Name:   Tashina WHITE Maestre Date of Exam: 09/28/2021 Medical Rec #:  [id number]           Height:       62.0 in Accession #:    [id number]          Weight:       122.1 lb Date of Birth:  04/04/1966           BSA:          1.550 m Patient Age:    87 years            BP:           122/81 mmHg Patient Gender: F                   HR:           80 bpm. Exam Location:  Inpatient Procedure: 2D Echo, Color Doppler and Cardiac Doppler Indications:    R06.9 DOE  History:        Patient has no prior history of Echocardiogram examinations.                 Risk Factors:Diabetes, Dyslipidemia and Hypertension.  Sonographer:    Raquel Sarna Senior RDCS Referring Phys: [place]  1. Left ventricular ejection fraction, by  estimation, is 65 to 70%. The left ventricle has normal function. The left ventricle has no regional wall motion abnormalities. Indeterminate diastolic filling due to E-A fusion.  2. Right ventricular systolic function is normal. The right ventricular size is normal. Tricuspid regurgitation signal is inadequate for assessing PA pressure.  3. A small pericardial effusion is present. The pericardial effusion is posterior to the left ventricle. There is no evidence of cardiac tamponade.  4. The mitral valve is grossly normal. No evidence of mitral valve regurgitation. No evidence of mitral stenosis.  5. The aortic valve is grossly normal. Aortic valve regurgitation is not visualized. No aortic stenosis is present.  6. The inferior vena cava is normal in size with greater than 50% respiratory variability, suggesting right atrial pressure of 3 mmHg. FINDINGS  Left Ventricle: Left  ventricular ejection fraction, by estimation, is 65 to 70%. The left ventricle has normal function. The left ventricle has no regional wall motion abnormalities. The left ventricular internal cavity size was normal in size. There is  no left ventricular hypertrophy. Indeterminate diastolic filling due to E-A fusion. Right Ventricle: The right ventricular size is normal. No increase in right ventricular wall thickness. Right ventricular systolic function is normal. Tricuspid regurgitation signal is inadequate for assessing PA pressure. Left Atrium: Left atrial size was normal in size. Right Atrium: Right atrial size was normal in size. Pericardium: A small pericardial effusion is present. The pericardial effusion is posterior to the left ventricle. There is no evidence of cardiac tamponade. Mitral Valve: The mitral valve is grossly normal. No evidence of mitral valve regurgitation. No evidence of mitral valve stenosis. Tricuspid Valve: The tricuspid valve is grossly normal. Tricuspid valve regurgitation is not demonstrated. No evidence of  tricuspid stenosis. Aortic Valve: The aortic valve is grossly normal. Aortic valve regurgitation is not visualized. No aortic stenosis is present. Pulmonic Valve: The pulmonic valve was grossly normal. Pulmonic valve regurgitation is not visualized. No evidence of pulmonic stenosis. Aorta: The aortic root is normal in size and structure. Venous: The inferior vena cava is normal in size with greater than 50% respiratory variability, suggesting right atrial pressure of 3 mmHg. IAS/Shunts: The atrial septum is grossly normal. Additional Comments: There is a small pleural effusion in the left lateral region.  LEFT VENTRICLE PLAX 2D LVIDd:         3.20 cm   Diastology LVIDs:         1.90 cm   LV e' medial:    6.42 cm/s LV PW:         1.00 cm   LV E/e' medial:  10.5 LV IVS:        0.80 cm   LV e' lateral:   6.42 cm/s LVOT diam:     2.00 cm   LV E/e' lateral: 10.5 LV SV:         63 LV SV Index:   41 LVOT Area:     3.14 cm  RIGHT VENTRICLE RV S prime:     18.60 cm/s TAPSE (M-mode): 2.7 cm LEFT ATRIUM             Index        RIGHT ATRIUM           Index LA diam:        2.40 cm 1.55 cm/m   RA Area:     13.60 cm LA Vol (A2C):   28.6 ml 18.45 ml/m  RA Volume:   32.40 ml  20.90 ml/m LA Vol (A4C):   24.6 ml 15.87 ml/m LA Biplane Vol: 29.1 ml 18.77 ml/m  AORTIC VALVE LVOT Vmax:   86.20 cm/s LVOT Vmean:  66.000 cm/s LVOT VTI:    0.202 m  AORTA Ao Root diam: 2.80 cm MITRAL VALVE MV Area (PHT): 3.63 cm    SHUNTS MV Decel Time: 209 msec    Systemic VTI:  0.20 m MV E velocity: 67.70 cm/s  Systemic Diam: 2.00 cm MV A velocity: 66.00 cm/s MV E/A ratio:  1.03 Eleonore Chiquito MD Electronically signed by Eleonore Chiquito MD Signature Date/Time: 09/28/2021/5:49:11 PM    Final     Assessment/Plan: Postop day #2: So far the cultures are negative.  I appreciate the hospitalist and ID's help with this patient.  I agree with the plan to treat her empirically  for an infection as this is the most likely cause of the destruction of her  thoracic vertebrae, paraspinous abscess, epidural process, etc.  She will need a PICC line.  At some point she is going to need a thoracolumbar instrumentation and fusion to stabilize her spine.  I would prefer to at least have the presumed infection partially treated prior to placing instrumentation.  Perhaps we can do the surgery in a few weeks.  I have discussed all this with the patient and answered all her questions.  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  We will asked the social workers to help [place] determine whether home IV antibiotics are an option.  LOS: 6 days     [name] 09/30/2021, 8:45 AM     Patient ID: Ellen Lane, female   DOB: 1966/08/26, 55 y.o.   MRN: [id number]
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
## 2021-09-30 NOTE — Progress Notes (Signed)
Orthopedic Tech Progress Note Patient Details:  Ellen Lane 03/15/1966 [id number]  Ortho Devices Type of Ortho Device: Thoracolumbar corset (TLSO) Ortho Device/Splint Interventions: Ordered   Post Interventions Patient Tolerated: Other (comment) Instructions Provided: Other (comment)  [name] 09/30/2021, 11:39 PM

## 2021-09-30 NOTE — Progress Notes (Signed)
Inpatient Rehab Admissions Coordinator Note:   Per therapy recommendations patient was screened for CIR candidacy by Michel Santee, PT. At this time, pt appears to be a potential candidate for CIR. I will place an order for rehab consult for full assessment, per our protocol.  Please contact me any with questions.Shann Medal, PT, DPT [phone] 09/30/21 5:40 PM

## 2021-09-30 NOTE — Progress Notes (Signed)
Pharmacy Antibiotic Note  Ellen Lane is a 55 y.o. female admitted on 09/24/2021 and now found to have epidural abscess.  Pharmacy was consulted for vancomycin and to transition Zosyn >> cefepime.   Vancomycin trough level drawn this evening prior to the 5th dose of vancomycin 1250 mg IV Q 12 hr regimen was 23 mg/L (trough level was ~11.6 hrs after last dose). RN hung tonight's dose prior to high trough level resulting (pt had rec'd ~10-15 mins of 90-minute infusion).   WBC 8.7, temp 99.3 F (oral); Scr 0.43, CrCl 64.3 ml/min (renal function stable)  Plan: Given high vancomycin trough level, advised RN to discontinue vancomycin dose; will check vancomycin random level, SCr with AM labs to evaluate vancomycin clearance and determine subsequent dosing strategy Continue cefepime 2 gm IV Q 8 hrs Continue to follow renal function, culture results, LOT, and antibiotic de-escalation plans   Height: 5\' 2"  (157.5 cm) Weight: 55.4 kg (122 lb 2.2 oz) (Bed weight) IBW/kg (Calculated) : 50.1  Temp (24hrs), Avg:98.8 F (37.1 C), Min:98.2 F (36.8 C), Max:99.3 F (37.4 C)  Recent Labs  Lab 09/26/21 0733 09/27/21 0204 09/27/21 1638 09/28/21 0430 09/29/21 0915 09/30/21 0606 09/30/21 0641 09/30/21 2037  WBC 5.4 5.5  --  5.5 6.8 8.7  --   --   CREATININE 0.55 0.57  --  0.56 0.57  --  0.43*  --   VANCOTROUGH  --   --  11*  --   --   --   --  23*     Estimated Creatinine Clearance: 64.3 mL/min (A) (by C-G formula based on SCr of 0.43 mg/dL (L)).    Allergies  Allergen Reactions   Ace Inhibitors Cough   Sulfa Antibiotics Hives    Antimicrobials this admission: Zosyn 12/9 >> 12/14 Vancomycin 12/9 >> Cefepime 12/14 >>  Dose adjustments this admission: N/a  Microbiology results: 12/8 UCx >> 100K E coli (R-Cipro/Bactrim) + 70K Kleb pneumoniae (R-amp/Unasyn, I-nitro) 12/9 BCx X 2 >> NG/Final 12/9 Abscess cx >> NG/Final 12/12 Tissue cx (epidural abscess) >> NGTD  Thank you for  allowing pharmacy to be a part of this patients care.  Gillermina Hu, PharmD, BCPS, [facility] Clinical Pharmacist 09/30/2021 9:28 PM

## 2021-09-30 NOTE — Progress Notes (Signed)
Patient's Vancomycin trough resulted at 23. Hildred Alamin, pharmacist and Dr. Cyd Silence notified.
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
## 2021-09-30 NOTE — Progress Notes (Signed)
Nurse reported POC for patient to go home on 8 wks of ATB. Instructed to get PICC line order for PICC placement. VU. Fran Lowes, RN VAST
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
## 2021-09-30 NOTE — Progress Notes (Signed)
Pharmacy Antibiotic Note  Ellen Lane is a 55 y.o. female admitted on 09/24/2021 and now found to have epidural abscess.  Pharmacy has been consulted for Vancomycin and to transition Zosyn >> Cefepime.   Plan: - Start Cefepime 2g IV every 8 hours - Continue Vancomycin 1250 mg IV every 12 hours - Will continue to follow renal function, culture results, LOT, and antibiotic de-escalation plans   Height: 5\' 2"  (157.5 cm) Weight: 55.4 kg (122 lb 2.2 oz) (Bed weight) IBW/kg (Calculated) : 50.1  Temp (24hrs), Avg:98.8 F (37.1 C), Min:98.2 F (36.8 C), Max:99.9 F (37.7 C)  Recent Labs  Lab 09/26/21 0733 09/27/21 0204 09/27/21 1638 09/28/21 0430 09/29/21 0915 09/30/21 0606 09/30/21 0641  WBC 5.4 5.5  --  5.5 6.8 8.7  --   CREATININE 0.55 0.57  --  0.56 0.57  --  0.43*  VANCOTROUGH  --   --  11*  --   --   --   --     Estimated Creatinine Clearance: 64.3 mL/min (A) (by C-G formula based on SCr of 0.43 mg/dL (L)).    Allergies  Allergen Reactions   Ace Inhibitors Cough   Sulfa Antibiotics Hives    Antimicrobials this admission: Zosyn 12/9 >> 12/14 Vancomycin 12/9 >> Cefepime 12/14 >>  Dose adjustments this admission: N/a  Microbiology results: 12/8 UCx >> 100k E coli (R-Cipro/Bactrim) + 70k K PNA (R-amp/Unasyn, I-nitro) 12/9 BCx >> NGf 12/9 Abscess cx >> Ntd 12/12 Tissue cx >> ngx1d  Thank you for allowing pharmacy to be a part of this patients care.  Alycia Rossetti, PharmD, BCPS Clinical Pharmacist 09/30/2021 9:33 AM   **Pharmacist phone directory can now be found on [email or web address] ([email or web address]).  Listed under [place].

## 2021-10-01 LAB — GLUCOSE, CAPILLARY
Glucose-Capillary: 111 mg/dL — ABNORMAL HIGH (ref 70–99)
Glucose-Capillary: 163 mg/dL — ABNORMAL HIGH (ref 70–99)
Glucose-Capillary: 112 mg/dL — ABNORMAL HIGH (ref 70–99)
Glucose-Capillary: 104 mg/dL — ABNORMAL HIGH (ref 70–99)

## 2021-10-01 LAB — CBC WITH DIFFERENTIAL/PLATELET
Abs Immature Granulocytes: 0.05 10*3/uL (ref 0.00–0.07)
Basophils Absolute: 0 10*3/uL (ref 0.0–0.1)
Basophils Relative: 0 %
Eosinophils Absolute: 0.2 10*3/uL (ref 0.0–0.5)
Eosinophils Relative: 3 %
HCT: 25.4 % — ABNORMAL LOW (ref 36.0–46.0)
Hemoglobin: 8.8 g/dL — ABNORMAL LOW (ref 12.0–15.0)
Immature Granulocytes: 1 %
Lymphocytes Relative: 33 %
Lymphs Abs: 2.3 10*3/uL (ref 0.7–4.0)
MCH: 27.6 pg (ref 26.0–34.0)
MCHC: 34.6 g/dL (ref 30.0–36.0)
MCV: 79.6 fL — ABNORMAL LOW (ref 80.0–100.0)
Monocytes Absolute: 0.7 10*3/uL (ref 0.1–1.0)
Monocytes Relative: 10 %
Neutro Abs: 3.8 10*3/uL (ref 1.7–7.7)
Neutrophils Relative %: 53 %
Platelets: 237 10*3/uL (ref 150–400)
RBC: 3.19 MIL/uL — ABNORMAL LOW (ref 3.87–5.11)
RDW: 15.9 % — ABNORMAL HIGH (ref 11.5–15.5)
WBC: 7.1 10*3/uL (ref 4.0–10.5)
nRBC: 0 % (ref 0.0–0.2)

## 2021-10-01 LAB — VANCOMYCIN, RANDOM: Vancomycin Rm: 18

## 2021-10-01 LAB — BASIC METABOLIC PANEL
Anion gap: 4 — ABNORMAL LOW (ref 5–15)
BUN: 6 mg/dL (ref 6–20)
CO2: 25 mmol/L (ref 22–32)
Calcium: 8.5 mg/dL — ABNORMAL LOW (ref 8.9–10.3)
Chloride: 106 mmol/L (ref 98–111)
Creatinine, Ser: 0.47 mg/dL (ref 0.44–1.00)
GFR, Estimated: >60 mL/min (ref >60)
Glucose, Bld: 102 mg/dL — ABNORMAL HIGH (ref 70–99)
Potassium: 4.1 mmol/L (ref 3.5–5.1)
Sodium: 135 mmol/L (ref 135–145)

## 2021-10-01 MED ORDER — SODIUM CHLORIDE 0.9 % IV SOLN
500.0000 mg | Freq: Every day | INTRAVENOUS | Status: DC
  Administered 2021-10-01: 500 mg via INTRAVENOUS
  Filled 2021-10-01 (×2): qty 10

## 2021-10-01 MED ORDER — VANCOMYCIN HCL IN DEXTROSE 1-5 GM/200ML-% IV SOLN
1000.0000 mg | Freq: Two times a day (BID) | INTRAVENOUS | Status: DC
  Administered 2021-10-01: 1000 mg via INTRAVENOUS
  Filled 2021-10-01: qty 200

## 2021-10-01 NOTE — Progress Notes (Addendum)
Referring Physician(s): Gean Birchwood  Supervising Physician: Corrie Mckusick  Patient Status:  [facility] - In-pt  Chief Complaint: Follow up left paraspinal fluid collection drain placement 09/25/21 in IR  Subjective:  Patient sitting up in bed, on phone with insurance company - no concerns voiced.  Allergies: Ace inhibitors and Sulfa antibiotics  Medications: Prior to Admission medications   Medication Sig Start Date End Date Taking? Authorizing Provider  baclofen (LIORESAL) 10 MG tablet Take 1 tablet by mouth 3 times a day 06/16/21  Yes   bictegravir-emtricitabine-tenofovir AF (BIKTARVY) 50-200-25 MG TABS tablet TAKE 1 TABLET BY MOUTH DAILY. 05/25/21 05/25/22 Yes Truman Hayward, MD  metFORMIN (GLUCOPHAGE) 500 MG tablet TAKE 1 TABLET BY MOUTH TWICE DAILY WITH A MEAL. Patient taking differently: Take 500 mg by mouth 2 (two) times daily as needed (high blood sugar). 08/18/21  Yes   Multiple Vitamin (MULTIVITAMIN WITH MINERALS) TABS tablet Take 1 tablet by mouth daily.   Yes [provider]  oxybutynin (DITROPAN) 5 MG tablet TAKE 1 TABLET BY MOUTH 3  TIMES DAILY 09/08/21  Yes   oxyCODONE-acetaminophen (PERCOCET/ROXICET) 5-325 MG tablet Take one tablet by mouth every 8 (eight) hours as needed for pain 09/17/21  Yes   Semaglutide, 2 MG/DOSE, (OZEMPIC, 2 MG/DOSE,) 8 MG/3ML SOPN Inject 2 mg into the skin once a week. 09/07/21  Yes   Blood Glucose Monitoring Suppl ([place]) w/Device KIT USE AS DIRECTED TWICE DAILY. 12/16/20 12/16/21  Vicenta Aly, FNP  gabapentin (NEURONTIN) 400 MG capsule Take one capsule (400 mg dose) by mouth 3 (three) times a day. 09/23/21     glucose blood test strip CHECK BLOOD SUGAR FOUR TIMES DAILY AS DIRECTED DX: E11.9 08/24/17   [provider]  glucose blood test strip USE 1 STRIP TO CHECK BLOOD SUGAR TWICE DAILY 12/16/20 12/16/21  Vicenta Aly, FNP  Insulin Pen Needle 32G X 6 MM MISC USE TO INJECT  SUBCUTANEOUSLY ONE TIME  DAILY  09/20/18   [provider]  Lancets (ONETOUCH DELICA PLUS [id number]) [place] USE AS DIRECTED TWICE DAILY TO TEST BLOOD SUGAR 12/16/20 12/16/21  Vicenta Aly, FNP  metFORMIN (GLUCOPHAGE) 500 MG tablet TAKE 1 TABLET BY MOUTH TWICE DAILY WITH A MEAL 06/06/20 06/06/21  Vicenta Aly, FNP     Vital Signs: BP 131/79 (BP Location: Left Arm)    Pulse 86    Temp 98 F (36.7 C) (Oral)    Resp 20    Ht _0  (1.575 m)    Wt 122 lb 2.2 oz (55.4 kg) Comment: Bed weight   LMP 01/08/2015    SpO2 100%    BMI 22.34 kg/m   Physical Exam Vitals and nursing note reviewed.  Constitutional:      General: She is not in acute distress. Cardiovascular:     Rate and Rhythm: Normal rate.  Pulmonary:     Effort: Pulmonary effort is normal.  Musculoskeletal:     Comments: Left paraspinal drain to suction bulb with ~20 cc clear, serosanguineous output present. Drain not flushed/site not examined per patient request due to being on the phone. No concerns noted by staff.  Skin:    General: Skin is warm and dry.  Neurological:     Mental Status: She is alert. Mental status is at baseline.    Imaging: DG Lumbar Spine 1 View  Result Date: 09/28/2021 CLINICAL DATA:  T10-L3 laminectomy 4 epidural abscess EXAM: LUMBAR SPINE - 1 VIEW COMPARISON:  09/24/2021 FINDINGS: Cross-table  lateral view of the lumbar spine was obtained during the procedure for localization purposes. Surgical probe is seen posterior to the L2 vertebral body. Surgical drain is seen coiled within the known left paraspinal abscess. IMPRESSION: 1. Localization exam, with surgical probe posterior to the L2 vertebral body. 2. Pigtail catheter within the known left paraspinal abscess. Electronically Signed   By: Randa Ngo M.D.   On: 09/28/2021 16:57   ECHOCARDIOGRAM COMPLETE  Result Date: 09/28/2021    ECHOCARDIOGRAM REPORT   Patient Name:   [facility] Date of Exam: 09/28/2021 Medical Rec #:  [id number]           Height:       62.0 in  Accession #:    [id number]          Weight:       122.1 lb Date of Birth:  09-07-1966           BSA:          1.550 m Patient Age:    55 years            BP:           122/81 mmHg Patient Gender: F                   HR:           80 bpm. Exam Location:  Inpatient Procedure: 2D Echo, Color Doppler and Cardiac Doppler Indications:    R06.9 DOE  History:        Patient has no prior history of Echocardiogram examinations.                 Risk Factors:Diabetes, Dyslipidemia and Hypertension.  Sonographer:    Raquel Sarna Senior RDCS Referring Phys: Brimhall Nizhoni  1. Left ventricular ejection fraction, by estimation, is 65 to 70%. The left ventricle has normal function. The left ventricle has no regional wall motion abnormalities. Indeterminate diastolic filling due to E-A fusion.  2. Right ventricular systolic function is normal. The right ventricular size is normal. Tricuspid regurgitation signal is inadequate for assessing PA pressure.  3. A small pericardial effusion is present. The pericardial effusion is posterior to the left ventricle. There is no evidence of cardiac tamponade.  4. The mitral valve is grossly normal. No evidence of mitral valve regurgitation. No evidence of mitral stenosis.  5. The aortic valve is grossly normal. Aortic valve regurgitation is not visualized. No aortic stenosis is present.  6. The inferior vena cava is normal in size with greater than 50% respiratory variability, suggesting right atrial pressure of 3 mmHg. FINDINGS  Left Ventricle: Left ventricular ejection fraction, by estimation, is 65 to 70%. The left ventricle has normal function. The left ventricle has no regional wall motion abnormalities. The left ventricular internal cavity size was normal in size. There is  no left ventricular hypertrophy. Indeterminate diastolic filling due to E-A fusion. Right Ventricle: The right ventricular size is normal. No increase in right ventricular wall thickness. Right ventricular  systolic function is normal. Tricuspid regurgitation signal is inadequate for assessing PA pressure. Left Atrium: Left atrial size was normal in size. Right Atrium: Right atrial size was normal in size. Pericardium: A small pericardial effusion is present. The pericardial effusion is posterior to the left ventricle. There is no evidence of cardiac tamponade. Mitral Valve: The mitral valve is grossly normal. No evidence of mitral valve regurgitation. No evidence of mitral valve stenosis. Tricuspid Valve: The tricuspid valve  is grossly normal. Tricuspid valve regurgitation is not demonstrated. No evidence of tricuspid stenosis. Aortic Valve: The aortic valve is grossly normal. Aortic valve regurgitation is not visualized. No aortic stenosis is present. Pulmonic Valve: The pulmonic valve was grossly normal. Pulmonic valve regurgitation is not visualized. No evidence of pulmonic stenosis. Aorta: The aortic root is normal in size and structure. Venous: The inferior vena cava is normal in size with greater than 50% respiratory variability, suggesting right atrial pressure of 3 mmHg. IAS/Shunts: The atrial septum is grossly normal. Additional Comments: There is a small pleural effusion in the left lateral region.  LEFT VENTRICLE PLAX 2D LVIDd:         3.20 cm   Diastology LVIDs:         1.90 cm   LV e' medial:    6.42 cm/s LV PW:         1.00 cm   LV E/e' medial:  10.5 LV IVS:        0.80 cm   LV e' lateral:   6.42 cm/s LVOT diam:     2.00 cm   LV E/e' lateral: 10.5 LV SV:         63 LV SV Index:   41 LVOT Area:     3.14 cm  RIGHT VENTRICLE RV S prime:     18.60 cm/s TAPSE (M-mode): 2.7 cm LEFT ATRIUM             Index        RIGHT ATRIUM           Index LA diam:        2.40 cm 1.55 cm/m   RA Area:     13.60 cm LA Vol (A2C):   28.6 ml 18.45 ml/m  RA Volume:   32.40 ml  20.90 ml/m LA Vol (A4C):   24.6 ml 15.87 ml/m LA Biplane Vol: 29.1 ml 18.77 ml/m  AORTIC VALVE LVOT Vmax:   86.20 cm/s LVOT Vmean:  66.000 cm/s LVOT  VTI:    0.202 m  AORTA Ao Root diam: 2.80 cm MITRAL VALVE MV Area (PHT): 3.63 cm    SHUNTS MV Decel Time: 209 msec    Systemic VTI:  0.20 m MV E velocity: 67.70 cm/s  Systemic Diam: 2.00 cm MV A velocity: 66.00 cm/s MV E/A ratio:  1.03 Eleonore Chiquito MD Electronically signed by Eleonore Chiquito MD Signature Date/Time: 09/28/2021/5:49:11 PM    Final     Labs:  CBC: Recent Labs    09/28/21 0430 09/29/21 0915 09/30/21 0606 10/01/21 0533  WBC 5.5 6.8 8.7 7.1  HGB 8.9* 9.0* 10.0* 8.8*  HCT 26.9* 26.3* 31.1* 25.4*  PLT 273 237 267 237    COAGS: No results for input(s): INR, APTT in the last 8760 hours.  BMP: Recent Labs    02/26/21 1037 09/24/21 0835 09/28/21 0430 09/29/21 0915 09/30/21 0641 10/01/21 0533  NA 139   < > 138 135 135 135  K 3.6   < > 3.7 3.7 3.8 4.1  CL 104   < > 105 104 104 106  CO2 27   < > _0 GLUCOSE 78   < > 92 117* 108* 102*  BUN 10   < > <5* _1 CALCIUM 9.5   < > 8.5* 8.4* 8.3* 8.5*  CREATININE 0.54   < > 0.56 0.57 0.43* 0.47  GFRNONAA 108   < > >60 >60 >60 >60  GFRAA  125  --   --   --   --   --    < > = values in this interval not displayed.    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  Culture of aspirate with no growth, WBC 7.1, hgb 8.8.  Drain Location: left paraspinal Size: Fr size: 10 Fr Date of placement: 12/9  Currently to: Drain collection device: suction bulb 24 hour output:  Output by Drain (mL) 09/29/21 0701 - 09/29/21 1900 09/29/21 1901 - 09/30/21 0700 09/30/21 0701 - 09/30/21 1900 09/30/21 1901 - 10/01/21 0700 10/01/21 0701 - 10/01/21 1535  Closed System Drain 1 Inferior;Left Back 10.2  Fr.  30  15     Interval imaging/drain manipulation:  None  Current examination: Patient requested no site check/flush today due to being on an important phone call -- no concerns noted by staff. ~20 cc clear, serosanguineous output in bulb on exam today  Plan: Continue TID flushes with 5 cc NS. Record output Q shift. Dressing changes QD or PRN if soiled.  Call IR APP or on call IR MD if difficulty flushing or sudden change in drain output.  Repeat imaging/possible drain injection once output < 10 mL/QD (excluding flush material.)  Discharge planning: Please contact IR APP or on call IR MD prior to patient d/c to ensure appropriate follow up plans are in place. Typically patient will follow up with IR clinic 10-14 days post d/c for repeat imaging/possible drain injection. IR scheduler will contact patient with date/time of appointment. Patient will need to flush drain QD with 5 cc NS, record output QD, dressing changes every 2-3 days or earlier if soiled.   IR will continue to follow - please call with questions or concerns.  Electronically Signed: Joaquim Nam, PA-C 10/01/2021, 3:24 PM   I spent a total of 15 Minutes at the the patient's bedside AND on the patient's hospital floor or unit, greater than 50% of which was counseling/coordinating care for left paraspinal fluid collection drain.
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
## 2021-10-01 NOTE — Progress Notes (Signed)
Pharmacy Antibiotic Note  Ellen Lane is a 55 y.o. female admitted on 09/24/2021 and now found to have epidural abscess.  Asked by ID to change vancomycin to daptomycin and continue cefepime.   Plan:  Change vancomycin to daptomycin 500mg  IV q24h (9mg /kg/day).   Continue cefepime 2g IV q8h CK in AM   Height: 5\' 2"  (157.5 cm) Weight: 55.4 kg (122 lb 2.2 oz) (Bed weight) IBW/kg (Calculated) : 50.1  Temp (24hrs), Avg:99.1 F (37.3 C), Min:98.4 F (36.9 C), Max:99.5 F (37.5 C)  Recent Labs  Lab 09/27/21 0204 09/27/21 1638 09/28/21 0430 09/29/21 0915 09/30/21 0606 09/30/21 0641 09/30/21 2037 10/01/21 0533  WBC 5.5  --  5.5 6.8 8.7  --   --  7.1  CREATININE 0.57  --  0.56 0.57  --  0.43*  --  0.47  VANCOTROUGH  --  11*  --   --   --   --  23*  --   VANCORANDOM  --   --   --   --   --   --   --  18     Estimated Creatinine Clearance: 64.3 mL/min (by C-G formula based on SCr of 0.47 mg/dL).    Allergies  Allergen Reactions   Ace Inhibitors Cough   Sulfa Antibiotics Hives    Antimicrobials this admission: Zosyn 12/9 >> 12/14 Vancomycin 12/9 >>12/15 Cefepime 12/14 >> Daptomycin 12/15>>  Dose adjustments this admission:  Microbiology results: 12/8 UCx >> 100K E coli (R-Cipro/Bactrim) + 70K Kleb pneumoniae (R-amp/Unasyn, I-nitro) 12/9 BCx X 2 >> NG/Final 12/9 Abscess cx >> NG/Final 12/12 Tissue cx (epidural abscess) >> NGTD   Heide Guile, PharmD, BCPS-AQ ID Clinical Pharmacist Phone [phone]
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
## 2021-10-01 NOTE — Progress Notes (Signed)
Pharmacy Antibiotic Note  Ellen Lane is a 55 y.o. female admitted on 09/24/2021 and now found to have epidural abscess.  Pharmacy was consulted for vancomycin and cefepime.  -Vancomycin level= 18  -SCr= 0.47   Plan: -Change vancomycin to 1000mg  IV q12h -Recheck levels at steady state    Height: 5\' 2"  (157.5 cm) Weight: 55.4 kg (122 lb 2.2 oz) (Bed weight) IBW/kg (Calculated) : 50.1  Temp (24hrs), Avg:99 F (37.2 C), Min:98.4 F (36.9 C), Max:99.5 F (37.5 C)  Recent Labs  Lab 09/27/21 0204 09/27/21 1638 09/28/21 0430 09/29/21 0915 09/30/21 0606 09/30/21 0641 09/30/21 2037 10/01/21 0533  WBC 5.5  --  5.5 6.8 8.7  --   --  7.1  CREATININE 0.57  --  0.56 0.57  --  0.43*  --  0.47  VANCOTROUGH  --  11*  --   --   --   --  23*  --   VANCORANDOM  --   --   --   --   --   --   --  18     Estimated Creatinine Clearance: 64.3 mL/min (by C-G formula based on SCr of 0.47 mg/dL).    Allergies  Allergen Reactions   Ace Inhibitors Cough   Sulfa Antibiotics Hives    Antimicrobials this admission: Zosyn 12/9 >> 12/14 Vancomycin 12/9 >> Cefepime 12/14 >>  Dose adjustments this admission:  Microbiology results: 12/8 UCx >> 100K E coli (R-Cipro/Bactrim) + 70K Kleb pneumoniae (R-amp/Unasyn, I-nitro) 12/9 BCx X 2 >> NG/Final 12/9 Abscess cx >> NG/Final 12/12 Tissue cx (epidural abscess) >> NGTD   Hildred Laser, PharmD Clinical Pharmacist **Pharmacist phone directory can now be found on [email or web address] ([email or web address]).  Listed under [place].
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
## 2021-10-01 NOTE — Progress Notes (Signed)
Inpatient Rehabilitation Admissions Coordinator   I met at bedside with patient for rehab assessment.. Patient lives at home alone for 53 year old son is at [facility]  for psych care for the past month. She has local family and a brother arriving from [place]. I discuses goals and expectations of a possible CIR admit if she had caregiver supports at home to assist with antibiotics, wound care , Etc. She will discuss with family and I will follow up tomorrow.  Danne Baxter, RN, MSN Rehab Admissions Coordinator [phone] 10/01/2021 1:24 PM

## 2021-10-01 NOTE — Progress Notes (Signed)
Subjective: The patient is alert and pleasant.  She feels better today.  Objective: Vital signs in last 24 hours: Temp:  [98.4 F (36.9 C)-99.5 F (37.5 C)] 98.4 F (36.9 C) (12/15 0342) Pulse Rate:  [83-103] 83 (12/15 0342) Resp:  [17] 17 (12/15 0342) BP: (101-126)/(60-73) 113/66 (12/15 0342) SpO2:  [99 %-100 %] 100 % (12/15 0342) Estimated body mass index is 22.34 kg/m as calculated from the following:   Height as of this encounter: 5\' 2"  (1.575 m).   Weight as of this encounter: 55.4 kg.   Intake/Output from previous day: 12/14 0701 - 12/15 0700 In: 353 [P.O.:120; I.V.:18; IV Piggyback:200] Out: 1915 [Urine:1900; Drains:15] Intake/Output this shift: No intake/output data recorded.  Physical exam the patient is alert and pleasant.  She is paraplegic.  Lab Results: Recent Labs    09/30/21 0606 10/01/21 0533  WBC 8.7 7.1  HGB 10.0* 8.8*  HCT 31.1* 25.4*  PLT 267 237   BMET Recent Labs    09/30/21 0641 10/01/21 0533  NA 135 135  K 3.8 4.1  CL 104 106  CO2 23 25  GLUCOSE 108* 102*  BUN 6 6  CREATININE 0.43* 0.47  CALCIUM 8.3* 8.5*    Studies/Results: No results found.  Assessment/Plan: Postop day #4: The patient is doing well.  The plan is for skilled nursing facility/rehab for her IV antibiotics.  We will likely do the second stage surgery in a few weeks.  LOS: 7 days     [name] 10/01/2021, 8:14 AM     Patient ID: Ellen Lane, female   DOB: 1965-12-27, 55 y.o.   MRN: [id number]

## 2021-10-02 LAB — CBC WITH DIFFERENTIAL/PLATELET
Abs Immature Granulocytes: 0.05 10*3/uL (ref 0.00–0.07)
Basophils Absolute: 0 10*3/uL (ref 0.0–0.1)
Basophils Relative: 1 %
Eosinophils Absolute: 0.3 10*3/uL (ref 0.0–0.5)
Eosinophils Relative: 4 %
HCT: 30 % — ABNORMAL LOW (ref 36.0–46.0)
Hemoglobin: 10.1 g/dL — ABNORMAL LOW (ref 12.0–15.0)
Immature Granulocytes: 1 %
Lymphocytes Relative: 33 %
Lymphs Abs: 2.2 10*3/uL (ref 0.7–4.0)
MCH: 27 pg (ref 26.0–34.0)
MCHC: 33.7 g/dL (ref 30.0–36.0)
MCV: 80.2 fL (ref 80.0–100.0)
Monocytes Absolute: 0.5 10*3/uL (ref 0.1–1.0)
Monocytes Relative: 7 %
Neutro Abs: 3.6 10*3/uL (ref 1.7–7.7)
Neutrophils Relative %: 54 %
Platelets: 286 10*3/uL (ref 150–400)
RBC: 3.74 MIL/uL — ABNORMAL LOW (ref 3.87–5.11)
RDW: 15.9 % — ABNORMAL HIGH (ref 11.5–15.5)
WBC: 6.6 10*3/uL (ref 4.0–10.5)
nRBC: 0 % (ref 0.0–0.2)

## 2021-10-02 LAB — BASIC METABOLIC PANEL
Anion gap: 6 (ref 5–15)
BUN: 7 mg/dL (ref 6–20)
CO2: 25 mmol/L (ref 22–32)
Calcium: 9.1 mg/dL (ref 8.9–10.3)
Chloride: 103 mmol/L (ref 98–111)
Creatinine, Ser: 0.47 mg/dL (ref 0.44–1.00)
GFR, Estimated: >60 mL/min (ref >60)
Glucose, Bld: 102 mg/dL — ABNORMAL HIGH (ref 70–99)
Potassium: 4.1 mmol/L (ref 3.5–5.1)
Sodium: 134 mmol/L — ABNORMAL LOW (ref 135–145)

## 2021-10-02 LAB — GLUCOSE, CAPILLARY
Glucose-Capillary: 98 mg/dL (ref 70–99)
Glucose-Capillary: 109 mg/dL — ABNORMAL HIGH (ref 70–99)
Glucose-Capillary: 119 mg/dL — ABNORMAL HIGH (ref 70–99)
Glucose-Capillary: 150 mg/dL — ABNORMAL HIGH (ref 70–99)
Glucose-Capillary: 105 mg/dL — ABNORMAL HIGH (ref 70–99)

## 2021-10-02 LAB — CK: Total CK: 28 U/L — ABNORMAL LOW (ref 38–234)

## 2021-10-02 MED ORDER — SODIUM CHLORIDE 0.9 % IV SOLN
600.0000 mg | Freq: Every day | INTRAVENOUS | Status: DC
  Administered 2021-10-02 – 2021-10-10 (×9): 600 mg via INTRAVENOUS
  Filled 2021-10-02 (×9): qty 12

## 2021-10-02 NOTE — Progress Notes (Signed)
Pharmacy Antibiotic Note  Ellen Lane is a 55 y.o. female admitted on 09/24/2021 and now found to have epidural abscess.  She continues on daptomycin and cefepime.  Weight updated to 74kg today.  Baseline CK is 23.   Plan:  Change daptomycin 600mg  IV q24h Continue cefepime 2g IV q8h CK weekly while on daptomycin   Height: 5\' 2"  (157.5 cm) Weight: 74 kg (163 lb 2.3 oz) IBW/kg (Calculated) : 50.1  Temp (24hrs), Avg:98.5 F (36.9 C), Min:98 F (36.7 C), Max:99 F (37.2 C)  Recent Labs  Lab 09/27/21 1638 09/28/21 0430 09/29/21 0915 09/30/21 0606 09/30/21 0641 09/30/21 2037 10/01/21 0533 10/02/21 0307  WBC  --  5.5 6.8 8.7  --   --  7.1 6.6  CREATININE  --  0.56 0.57  --  0.43*  --  0.47 0.47  VANCOTROUGH 11*  --   --   --   --  23*  --   --   VANCORANDOM  --   --   --   --   --   --  18  --      Estimated Creatinine Clearance: 76.6 mL/min (by C-G formula based on SCr of 0.47 mg/dL).    Allergies  Allergen Reactions   Ace Inhibitors Cough   Sulfa Antibiotics Hives    Antimicrobials this admission: Zosyn 12/9 >> 12/14 Vancomycin 12/9 >>12/15 Cefepime 12/14 >> Daptomycin 12/15>>  Dose adjustments this admission:  Microbiology results: 12/8 UCx >> 100K E coli (R-Cipro/Bactrim) + 70K Kleb pneumoniae (R-amp/Unasyn, I-nitro) 12/9 BCx X 2 >> NG/Final 12/9 Abscess cx >> NG/Final 12/12 Tissue cx (epidural abscess) >> NGTD   Ellen Lane, PharmD, BCPS-AQ ID Clinical Pharmacist Phone [phone]

## 2021-10-02 NOTE — Care Management Important Message (Signed)
Important Message  Patient Details  Name: Ellen Lane MRN: [id number] Date of Birth: 09-16-1966   Medicare Important Message Given:  Yes     Shelda Altes 10/02/2021, 12:11 PM

## 2021-10-02 NOTE — Progress Notes (Signed)
PHARMACY CONSULT NOTE FOR: daptomycin and cefepime  OUTPATIENT  PARENTERAL ANTIBIOTIC THERAPY (OPAT)  Indication: Vertebral osteomyelitis Regimen: daptomycin 600mg  IV q24h and cefepime 2g IV q8h End date: 11/22/21  IV antibiotic discharge orders are pended. To discharging provider:  please sign these orders via discharge navigator,  Select New Orders & click on the button choice - Manage This Unsigned Work.     Thank you for allowing pharmacy to be a part of this patient's care.  Candie Mile 10/02/2021, 11:45 AM

## 2021-10-02 NOTE — Progress Notes (Signed)
° °  Providing Compassionate, Quality Care - Together   Subjective: Patient reports she feels good this morning. No new issues.  Objective: Vital signs in last 24 hours: Temp:  [98.1 F (36.7 C)-99 F (37.2 C)] 98.7 F (37.1 C) (12/16 1230) Pulse Rate:  [79-92] 91 (12/16 1230) Resp:  [14-20] 16 (12/16 1230) BP: (92-124)/(59-84) 92/61 (12/16 1230) SpO2:  [97 %-100 %] 100 % (12/16 1230) Weight:  [74 kg] 74 kg (12/16 1138)  Intake/Output from previous day: 12/15 0701 - 12/16 0700 In: 110 [I.V.:10; IV Piggyback:100] Out: 2240 [Urine:2225] Intake/Output this shift: No intake/output data recorded.  Patient is alert and pleasant BUE with generalized weakness No movement BLE (T3 paraplegia) Thoracic surgical wound is covered with a Honeycomb dressing; dressing is clean, dry, and intact Left paraspinal drain placed by IR is in place. Appears to only have put out 30 mL over the last 24 hours.  Lab Results: Recent Labs    10/01/21 0533 10/02/21 0307  WBC 7.1 6.6  HGB 8.8* 10.1*  HCT 25.4* 30.0*  PLT 237 286   BMET Recent Labs    10/01/21 0533 10/02/21 0307  NA 135 134*  K 4.1 4.1  CL 106 103  CO2 25 25  GLUCOSE 102* 102*  BUN 6 7  CREATININE 0.47 0.47  CALCIUM 8.5* 9.1    Studies/Results: No results found.  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 She underwent thoracolumbar laminotomy with evacuation of granulation/ hematoma material by Dr. Arnoldo Morale on 09/28/2021. She is being treated with IV antibiotics per ID.    LOS: 8 days   -Plan is for thoracic stabilization surgery by Dr. Arnoldo Morale sometime in the next few weeks. -Paraspinal drain per IR. -Plan is for AIR at discharge.   [name], DNP, AGNP-C Nurse Practitioner  [facility] [place] [address], [place], [place], [place] [address] P: [phone]     F: [phone]  10/02/2021, 10:30 AM
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
## 2021-10-02 NOTE — Progress Notes (Signed)
Inpatient Rehabilitation Admissions Coordinator   I met at bedside with patient . Her brother, [name] has arrived from [place] to assist as her caregiver. I will begin insurance Auth with [place] for a possible Cir admit.  Danne Baxter, RN, MSN Rehab Admissions Coordinator [phone] 10/02/2021 1:18 PM
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
## 2021-10-02 NOTE — Progress Notes (Signed)
I&O catheter done at 2030. 520 mls of clear, yellow urine obtained.  Patient stated that she normally gets 500-600 mls out at home when she performs self-catheterization.

## 2021-10-03 LAB — CBC WITH DIFFERENTIAL/PLATELET
Abs Immature Granulocytes: 0.02 10*3/uL (ref 0.00–0.07)
Basophils Absolute: 0 10*3/uL (ref 0.0–0.1)
Basophils Relative: 1 %
Eosinophils Absolute: 0.2 10*3/uL (ref 0.0–0.5)
Eosinophils Relative: 4 %
HCT: 24.6 % — ABNORMAL LOW (ref 36.0–46.0)
Hemoglobin: 8.4 g/dL — ABNORMAL LOW (ref 12.0–15.0)
Immature Granulocytes: 0 %
Lymphocytes Relative: 34 %
Lymphs Abs: 2.1 10*3/uL (ref 0.7–4.0)
MCH: 27.1 pg (ref 26.0–34.0)
MCHC: 34.1 g/dL (ref 30.0–36.0)
MCV: 79.4 fL — ABNORMAL LOW (ref 80.0–100.0)
Monocytes Absolute: 0.5 10*3/uL (ref 0.1–1.0)
Monocytes Relative: 8 %
Neutro Abs: 3.2 10*3/uL (ref 1.7–7.7)
Neutrophils Relative %: 53 %
Platelets: 287 10*3/uL (ref 150–400)
RBC: 3.1 MIL/uL — ABNORMAL LOW (ref 3.87–5.11)
RDW: 15.7 % — ABNORMAL HIGH (ref 11.5–15.5)
WBC: 6.1 10*3/uL (ref 4.0–10.5)
nRBC: 0 % (ref 0.0–0.2)

## 2021-10-03 LAB — BASIC METABOLIC PANEL
Anion gap: 4 — ABNORMAL LOW (ref 5–15)
BUN: 7 mg/dL (ref 6–20)
CO2: 26 mmol/L (ref 22–32)
Calcium: 8.7 mg/dL — ABNORMAL LOW (ref 8.9–10.3)
Chloride: 104 mmol/L (ref 98–111)
Creatinine, Ser: 0.5 mg/dL (ref 0.44–1.00)
GFR, Estimated: >60 mL/min (ref >60)
Glucose, Bld: 100 mg/dL — ABNORMAL HIGH (ref 70–99)
Potassium: 4.1 mmol/L (ref 3.5–5.1)
Sodium: 134 mmol/L — ABNORMAL LOW (ref 135–145)

## 2021-10-03 LAB — AEROBIC/ANAEROBIC CULTURE W GRAM STAIN (SURGICAL/DEEP WOUND)
Culture: NO GROWTH
Culture: NO GROWTH
Gram Stain: NONE SEEN
Gram Stain: NONE SEEN

## 2021-10-03 LAB — GLUCOSE, CAPILLARY
Glucose-Capillary: 114 mg/dL — ABNORMAL HIGH (ref 70–99)
Glucose-Capillary: 151 mg/dL — ABNORMAL HIGH (ref 70–99)
Glucose-Capillary: 136 mg/dL — ABNORMAL HIGH (ref 70–99)
Glucose-Capillary: 111 mg/dL — ABNORMAL HIGH (ref 70–99)

## 2021-10-03 MED ORDER — COVID-19MRNA BIVAL VAC MODERNA 50 MCG/0.5ML IM SUSP
0.5000 mL | Freq: Once | INTRAMUSCULAR | Status: AC
  Administered 2021-10-05: 22:00:00 0.5 mL via INTRAMUSCULAR
  Filled 2021-10-03 (×3): qty 0.5

## 2021-10-03 MED ORDER — INFLUENZA VAC A&B SA ADJ QUAD 0.5 ML IM PRSY
0.5000 mL | PREFILLED_SYRINGE | INTRAMUSCULAR | Status: AC
  Administered 2021-10-04: 22:00:00 0.5 mL via INTRAMUSCULAR
  Filled 2021-10-03 (×2): qty 0.5

## 2021-10-03 NOTE — Plan of Care (Signed)

## 2021-10-03 NOTE — Progress Notes (Signed)
She has no complaints today.  Denies pain.  No change in exam.  Continue current management
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
## 2021-10-04 LAB — GLUCOSE, CAPILLARY
Glucose-Capillary: 153 mg/dL — ABNORMAL HIGH (ref 70–99)
Glucose-Capillary: 114 mg/dL — ABNORMAL HIGH (ref 70–99)
Glucose-Capillary: 150 mg/dL — ABNORMAL HIGH (ref 70–99)
Glucose-Capillary: 139 mg/dL — ABNORMAL HIGH (ref 70–99)

## 2021-10-04 NOTE — Progress Notes (Signed)
Patient ID: Ellen Lane, female   DOB: 11-26-1965, 55 y.o.   MRN: [id number] Patient stable improved pain baseline paraplegia incision clean and dry drain output minimal.  Continue IV antibiotics drain management per interventional radiology
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
## 2021-10-04 NOTE — Plan of Care (Signed)

## 2021-10-05 ENCOUNTER — Inpatient Hospital Stay (HOSPITAL_COMMUNITY): Payer: Medicare Other

## 2021-10-05 ENCOUNTER — Other Ambulatory Visit: Payer: Self-pay | Admitting: Neurosurgery

## 2021-10-05 ENCOUNTER — Other Ambulatory Visit: Payer: Self-pay

## 2021-10-05 ENCOUNTER — Other Ambulatory Visit (HOSPITAL_COMMUNITY): Payer: Self-pay

## 2021-10-05 LAB — CBC WITH DIFFERENTIAL/PLATELET
Abs Immature Granulocytes: 0.04 10*3/uL (ref 0.00–0.07)
Basophils Absolute: 0 10*3/uL (ref 0.0–0.1)
Basophils Relative: 1 %
Eosinophils Absolute: 0.2 10*3/uL (ref 0.0–0.5)
Eosinophils Relative: 4 %
HCT: 27.8 % — ABNORMAL LOW (ref 36.0–46.0)
Hemoglobin: 9.3 g/dL — ABNORMAL LOW (ref 12.0–15.0)
Immature Granulocytes: 1 %
Lymphocytes Relative: 30 %
Lymphs Abs: 1.7 10*3/uL (ref 0.7–4.0)
MCH: 27.6 pg (ref 26.0–34.0)
MCHC: 33.5 g/dL (ref 30.0–36.0)
MCV: 82.5 fL (ref 80.0–100.0)
Monocytes Absolute: 0.4 10*3/uL (ref 0.1–1.0)
Monocytes Relative: 7 %
Neutro Abs: 3.2 10*3/uL (ref 1.7–7.7)
Neutrophils Relative %: 57 %
Platelets: 319 10*3/uL (ref 150–400)
RBC: 3.37 MIL/uL — ABNORMAL LOW (ref 3.87–5.11)
RDW: 16 % — ABNORMAL HIGH (ref 11.5–15.5)
WBC: 5.7 10*3/uL (ref 4.0–10.5)
nRBC: 0 % (ref 0.0–0.2)

## 2021-10-05 LAB — GLUCOSE, CAPILLARY
Glucose-Capillary: 125 mg/dL — ABNORMAL HIGH (ref 70–99)
Glucose-Capillary: 119 mg/dL — ABNORMAL HIGH (ref 70–99)
Glucose-Capillary: 200 mg/dL — ABNORMAL HIGH (ref 70–99)

## 2021-10-05 MED ORDER — ZINC OXIDE 20 % EX OINT
TOPICAL_OINTMENT | CUTANEOUS | Status: DC | PRN
  Filled 2021-10-05: qty 28.35
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
## 2021-10-05 NOTE — Progress Notes (Signed)
Inpatient Rehabilitation Admissions Coordinator   I met at bedside with patient . I await [facility] medicare approval for a possible CIR admit.  Danne Baxter, RN, MSN Rehab Admissions Coordinator [phone] 10/05/2021 12:36 PM

## 2021-10-05 NOTE — Progress Notes (Signed)
° ° °  Drain Location: paraspinal Size: Fr size: 10 Fr Date of placement: 09/25/21  Currently to: Drain collection device: suction bulb 24 hour output:  Output by Drain (mL) 10/03/21 0700 - 10/03/21 1459 10/03/21 1500 - 10/03/21 2259 10/03/21 2300 - 10/04/21 0659 10/04/21 0700 - 10/04/21 1459 10/04/21 1500 - 10/04/21 2259 10/04/21 2300 - 10/05/21 0659 10/05/21 0700 - 10/05/21 1459 10/05/21 1500 - 10/05/21 1604  Closed System Drain 1 Inferior;Left Back 10.2 Fr.  0          Interval imaging/drain manipulation:  CT today reveals resolution of paraspinal collection  Current examination: Insertion site unremarkable. Suture and stat lock in place. Dressed appropriately.  Scant OP  Plan: Drain removal today without incident.  Site bandaged.    Electronically Signed: Pasty Spillers, PA 10/05/2021, 4:06 PM   I spent a total of 15 Minutes at the the patient's bedside AND on the patient's hospital floor or unit, greater than 50% of which was counseling/coordinating care for paraspinal drain removal
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
## 2021-10-05 NOTE — Progress Notes (Signed)
Subjective: The patient is alert and pleasant.  Her back feels "better".  Objective: Vital signs in last 24 hours: Temp:  [98.4 F (36.9 C)-98.5 F (36.9 C)] 98.4 F (36.9 C) (12/19 0256) Pulse Rate:  [66-98] 78 (12/19 0256) Resp:  [16-20] 16 (12/19 0256) BP: (102-123)/(68-83) 121/83 (12/19 0256) SpO2:  [100 %] 100 % (12/19 0256) Estimated body mass index is 29.84 kg/m as calculated from the following:   Height as of this encounter: 5\' 2"  (1.575 m).   Weight as of this encounter: 74 kg.   Intake/Output from previous day: 12/18 0701 - 12/19 0700 In: 496.6 [P.O.:234; IV Piggyback:262.6] Out: 2400 [Urine:2400] Intake/Output this shift: No intake/output data recorded.  Physical exam the patient is alert and pleasant.  She remains a complete T3 paraplegic chronically.  Lab Results: Recent Labs    10/03/21 0214  WBC 6.1  HGB 8.4*  HCT 24.6*  PLT 287   BMET Recent Labs    10/03/21 0214  NA 134*  K 4.1  CL 104  CO2 26  GLUCOSE 100*  BUN 7  CREATININE 0.50  CALCIUM 8.7*    Studies/Results: No results found.  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  Another possibility would be a Charcot joint, but I think this is less likely since she had a paraspinous fluid collection/abscess and an epidural process, and feels better after antibiotics, which is more consistent with infection.  LOS: 11 days     Ellen Lane 10/05/2021, 8:03 AM     Patient ID: Ellen Lane, female   DOB: [birth year], 55 y.o.   MRN: [id number]

## 2021-10-05 NOTE — Progress Notes (Signed)
Pharmacy Antibiotic Note  Ellen Lane is a 55 y.o. female admitted on 09/24/2021 and now found to have epidural abscess s/p evacuation.  She continues on daptomycin and cefepime.  Weight updated to 74kg today.  Baseline CK is 23.  Plan: Continue daptomycin 600 mg IV q24h Continue cefepime 2g IV q8h CK weekly while on daptomycin Stop date: 11/22/21   Height: 5\' 2"  (157.5 cm) Weight: 74 kg (163 lb 2.3 oz) IBW/kg (Calculated) : 50.1  Temp (24hrs), Avg:98.5 F (36.9 C), Min:98.4 F (36.9 C), Max:98.8 F (37.1 C)  Recent Labs  Lab 09/29/21 0915 09/30/21 0606 09/30/21 0641 09/30/21 2037 10/01/21 0533 10/02/21 0307 10/03/21 0214 10/05/21 0830  WBC 6.8 8.7  --   --  7.1 6.6 6.1 5.7  CREATININE 0.57  --  0.43*  --  0.47 0.47 0.50  --   VANCOTROUGH  --   --   --  23*  --   --   --   --   VANCORANDOM  --   --   --   --  18  --   --   --      Estimated Creatinine Clearance: 76.6 mL/min (by C-G formula based on SCr of 0.5 mg/dL).    Allergies  Allergen Reactions   Ace Inhibitors Cough   Sulfa Antibiotics Hives    Antimicrobials this admission: Zosyn 12/9 >> 12/14 Vancomycin 12/9 >>12/15 Cefepime 12/14 >> Daptomycin 12/15>>  Dose adjustments this admission:  Microbiology results: 12/8 UCx >> 100K E coli (R-Cipro/Bactrim) + 70K Kleb pneumoniae (R-amp/Unasyn, I-nitro) 12/9 BCx X 2 >> NG/Final 12/9 Abscess cx >> NG/Final 12/12 Tissue cx (epidural abscess) >> NEG/Final   Thank you for involving pharmacy in this patient's care.  Renold Genta, PharmD, BCPS Clinical Pharmacist Clinical phone for 10/05/2021 until 3p is [phone] 10/05/2021 12:42 PM  **Pharmacist phone directory can be found on [email or web address] listed under [place]**
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
## 2021-10-06 LAB — GLUCOSE, CAPILLARY
Glucose-Capillary: 104 mg/dL — ABNORMAL HIGH (ref 70–99)
Glucose-Capillary: 111 mg/dL — ABNORMAL HIGH (ref 70–99)
Glucose-Capillary: 155 mg/dL — ABNORMAL HIGH (ref 70–99)
Glucose-Capillary: 159 mg/dL — ABNORMAL HIGH (ref 70–99)

## 2021-10-06 NOTE — Progress Notes (Signed)
Inpatient Rehabilitation Admissions Coordinator   I await insurance determination for a possible [facility] admit. I contacted Dr Arnoldo Morale as well as discussed with Dr Naaman Plummer therapy progress and intensity prior to planned fusion in [facility] January.   Danne Baxter, RN, MSN Rehab Admissions Coordinator [phone] 10/06/2021 3:40 PM
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
## 2021-10-06 NOTE — Plan of Care (Signed)
Pt has been turned q2h and cathed in & out q5hrs. Pt has had 2 BM's.   Problem: Education: Goal: Knowledge of General Education information will improve Description: Including pain rating scale, medication(s)/side effects and non-pharmacologic comfort measures Outcome: Progressing   Problem: Health Behavior/Discharge Planning: Goal: Ability to manage health-related needs will improve Outcome: Progressing   Problem: Clinical Measurements: Goal: Ability to maintain clinical measurements within normal limits will improve Outcome: Progressing Goal: Will remain free from infection Outcome: Progressing Goal: Diagnostic test results will improve Outcome: Progressing Goal: Respiratory complications will improve Outcome: Progressing Goal: Cardiovascular complication will be avoided Outcome: Progressing   Problem: Activity: Goal: Risk for activity intolerance will decrease Outcome: Progressing   Problem: Nutrition: Goal: Adequate nutrition will be maintained Outcome: Progressing   Problem: Coping: Goal: Level of anxiety will decrease Outcome: Progressing   Problem: Elimination: Goal: Will not experience complications related to bowel motility Outcome: Progressing Goal: Will not experience complications related to urinary retention Outcome: Progressing   Problem: Pain Managment: Goal: General experience of comfort will improve Outcome: Progressing   Problem: Safety: Goal: Ability to remain free from injury will improve Outcome: Progressing   Problem: Skin Integrity: Goal: Risk for impaired skin integrity will decrease Outcome: Progressing

## 2021-10-07 LAB — GLUCOSE, CAPILLARY
Glucose-Capillary: 111 mg/dL — ABNORMAL HIGH (ref 70–99)
Glucose-Capillary: 140 mg/dL — ABNORMAL HIGH (ref 70–99)
Glucose-Capillary: 174 mg/dL — ABNORMAL HIGH (ref 70–99)
Glucose-Capillary: 133 mg/dL — ABNORMAL HIGH (ref 70–99)
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
## 2021-10-07 NOTE — Progress Notes (Addendum)
Inpatient Rehabilitation Admissions Coordinator   I have received a denial from [facility] for [place] admit. I met with patient at bedside and she is aware. She requests I pursue appeal which I will begin today once I get updated therapy treatment notes from today.  Danne Baxter, RN, MSN Rehab Admissions Coordinator [phone] 10/07/2021 11:34 AM

## 2021-10-07 NOTE — Progress Notes (Signed)
° °  Providing Compassionate, Quality Care - Together   Subjective: Patient reports she feels better each day. She tells me the plan is for her to be admitted to [facility] and discharge home from there. She will return to the hospital for stabilization surgery.  Objective: Vital signs in last 24 hours: Temp:  [98.2 F (36.8 C)-99 F (37.2 C)] 98.7 F (37.1 C) (12/21 0908) Pulse Rate:  [71-109] 77 (12/21 0908) Resp:  [17-19] 18 (12/21 0908) BP: (108-127)/(66-74) 120/69 (12/21 0908) SpO2:  [96 %-100 %] 100 % (12/21 0908)  Intake/Output from previous day: 12/20 0701 - 12/21 0700 In: 630 [P.O.:630] Out: 4100 [Urine:4100] Intake/Output this shift: No intake/output data recorded.  Patient is alert and pleasant BUE with generalized weakness No movement BLE (T3 paraplegia) Thoracic surgical wound is covered with an ABD dressing; dressing is clean, dry, and intact  Lab Results: Recent Labs    10/05/21 0830  WBC 5.7  HGB 9.3*  HCT 27.8*  PLT 319   BMET No results for input(s): NA, K, CL, CO2, GLUCOSE, BUN, CREATININE, CALCIUM in the last 72 hours.  Studies/Results: CT LUMBAR SPINE WO CONTRAST  Result Date: 10/05/2021 CLINICAL DATA:  Paraspinal fluid collection with drain, now with no output. EXAM: CT LUMBAR SPINE WITHOUT CONTRAST TECHNIQUE: Multidetector CT imaging of the lumbar spine was performed without intravenous contrast administration. Multiplanar CT image reconstructions were also generated. COMPARISON:  Radiography 09/28/2021.  CT and MRI studies 09/24/2021. FINDINGS: Segmentation: 5 lumbar type vertebral bodies as numbered previously. Alignment: No malalignment. Vertebrae: Destructive process in the lower thoracic region, not primarily or completely evaluated on this examination. In the lumbar region, there are no destructive bone findings. Paraspinal and other soft tissues: Soft tissue drain within the posterior left para spinous musculature at the L2-3 level shows drainage of  the vast majority of the fluid previously seen in that location. Calcification within the para spinous musculature from T11 through L3 as seen previously. There is a newly manifest fluid collection within the posterior soft tissues in the midline extending from the level of T11 to L3-4, posterior to the spinous processes. This was not present previously. IMPRESSION: Apparent complete or near complete drainage of the fluid within the left posterior para spinous musculature. Dystrophic calcification within the musculature from the lower thoracic region to the level of L3. Newly seen fluid collection in the superficial soft tissues of the back posterior to the spinous processes extending from T11 to L3-4, presumably a new manifestation of the previous similar process in the left paraspinous musculature. This extends over a length of 13 cm and has a transverse diameter of 3.5 cm. Redemonstration of the destructive process at T10 and T11, not primarily or completely evaluated. Electronically Signed   By: Nelson Chimes M.D.   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 She underwent thoracolumbar laminotomy with evacuation of granulation/ hematoma material by Dr. Arnoldo Morale on 09/28/2021. She is being treated with IV antibiotics per ID.     LOS: 13 days   -Awaiting insurance approval for [facility] AIR -Thoracic stabilization planned for a couple weeks from now.   [name], DNP, AGNP-C Nurse Practitioner  [facility] [place] [address], [address], [place], [place] [address] P: [phone]     F: [phone]  10/07/2021, 11:10 AM
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
## 2021-10-08 LAB — GLUCOSE, CAPILLARY
Glucose-Capillary: 131 mg/dL — ABNORMAL HIGH (ref 70–99)
Glucose-Capillary: 102 mg/dL — ABNORMAL HIGH (ref 70–99)
Glucose-Capillary: 109 mg/dL — ABNORMAL HIGH (ref 70–99)
Glucose-Capillary: 123 mg/dL — ABNORMAL HIGH (ref 70–99)
Glucose-Capillary: 128 mg/dL — ABNORMAL HIGH (ref 70–99)
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
## 2021-10-08 NOTE — Plan of Care (Signed)

## 2021-10-09 ENCOUNTER — Encounter (HOSPITAL_COMMUNITY): Payer: Self-pay | Admitting: Internal Medicine

## 2021-10-09 LAB — GLUCOSE, CAPILLARY
Glucose-Capillary: 98 mg/dL (ref 70–99)
Glucose-Capillary: 101 mg/dL — ABNORMAL HIGH (ref 70–99)
Glucose-Capillary: 122 mg/dL — ABNORMAL HIGH (ref 70–99)
Glucose-Capillary: 99 mg/dL (ref 70–99)
Glucose-Capillary: 104 mg/dL — ABNORMAL HIGH (ref 70–99)

## 2021-10-09 LAB — CK: Total CK: 31 U/L — ABNORMAL LOW (ref 38–234)

## 2021-10-09 MED ORDER — POLYETHYLENE GLYCOL 3350 17 G PO PACK: 17.0000 g | PACK | Freq: Every day | ORAL | 0 refills | Status: DC

## 2021-10-09 MED ORDER — OXYCODONE-ACETAMINOPHEN 5-325 MG PO TABS
1.0000 | ORAL_TABLET | ORAL | Status: DC | PRN
  Administered 2021-10-09: 1 via ORAL
  Filled 2021-10-09: qty 1

## 2021-10-09 MED ORDER — SODIUM CHLORIDE 0.9 % IV SOLN
600.0000 mg | Freq: Every day | INTRAVENOUS | Status: DC
Start: 2021-10-09 — End: 2021-11-18

## 2021-10-09 MED ORDER — SODIUM CHLORIDE 0.9 % IV SOLN: 2.0000 g | Freq: Three times a day (TID) | INTRAVENOUS | Status: DC

## 2021-10-09 MED ORDER — ZINC OXIDE 20 % EX OINT
TOPICAL_OINTMENT | CUTANEOUS | 0 refills | Status: DC | PRN
Start: 2021-10-09 — End: 2023-03-04

## 2021-10-09 MED ORDER — BISACODYL 5 MG PO TBEC: 5.0000 mg | DELAYED_RELEASE_TABLET | Freq: Every day | ORAL | 0 refills | Status: AC | PRN

## 2021-10-09 MED ORDER — SENNOSIDES-DOCUSATE SODIUM 8.6-50 MG PO TABS: 2.0000 | ORAL_TABLET | Freq: Two times a day (BID) | ORAL | Status: DC
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
## 2021-10-09 NOTE — Progress Notes (Addendum)
Inpatient Rehabilitation Admissions Coordinator   I continue to await expedited appeal determination by [facility] [employer]. They have until 72 hrs to make their determination which will be 12/24. I spoke with them again this morning by phone as well as spoke with patient at bedside to update her on the status of the appeal.  Danne Baxter, RN, MSN Rehab Admissions Coordinator [phone] 10/09/2021 8:50 AM  I have received approval on the appeal to admit her to CIR. Bed is available on Saturday to admit. Patient is aware and in agreement, as well as acute team and TOC. Dr Naaman Plummer will be rehab MD admitting Saturday and Victor can call rehab Charge nurse at 12 noon on Saturday at [phone] to provide report and to verify room number and when bed is available.  Danne Baxter, RN, MSN Rehab Admissions Coordinator [phone] 10/09/2021 11:59 AM
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
## 2021-10-09 NOTE — Progress Notes (Signed)
° °  Providing Compassionate, Quality Care - Together   Subjective: Patient just finished therapy session. She is very excited to be going to [facility] tomorrow. She would like to move away from the Dilaudid, but feels like the Percocet 5/325 mg doesn't manage her pain as well.  Objective: Vital signs in last 24 hours: Temp:  [98.4 F (36.9 C)-98.9 F (37.2 C)] 98.7 F (37.1 C) (12/23 1121) Pulse Rate:  [72-87] 87 (12/23 1121) Resp:  [16-20] 20 (12/23 1121) BP: (98-115)/(61-76) 98/70 (12/23 1121) SpO2:  [95 %-100 %] 100 % (12/23 1121)  Intake/Output from previous day: 12/22 0701 - 12/23 0700 In: 683 [P.O.:680; I.V.:3] Out: 2125 [Urine:2125] Intake/Output this shift: Total I/O In: 120 [P.O.:120] Out: 500 [Urine:500]  Patient is alert and pleasant BUE with generalized weakness No movement BLE (T3 paraplegia) Thoracic surgical wound is covered with an ABD dressing; dressing is clean, dry, and intact  Lab Results: No results for input(s): WBC, HGB, HCT, PLT in the last 72 hours. BMET No results for input(s): NA, K, CL, CO2, GLUCOSE, BUN, CREATININE, CALCIUM in the last 72 hours.  Studies/Results: No results found.  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 She underwent thoracolumbar laminotomy with evacuation of granulation/ hematoma material by Dr. Arnoldo Morale on 09/28/2021. She is being treated with IV antibiotics per ID.   LOS: 15 days   -Plan is to discharge to [place]. -Stabilization surgery planned for 11/04/2021 with Dr. Arnoldo Morale. -Continue abx per ID.   [name], DNP, AGNP-C Nurse Practitioner  [facility] [place] [address], [address], [place], [place] [address] P: [phone]     F: [phone]  10/09/2021, 12:28 PM

## 2021-10-09 NOTE — Progress Notes (Deleted)
PHARMACY CONSULT NOTE FOR:  OUTPATIENT  PARENTERAL ANTIBIOTIC THERAPY (OPAT)  Indication: vertebral osteomyelitis Regimen: cefepime 2g IV every 8 hours and daptomycin 600 mg every 24 hours End date: 11/22/2021  IV antibiotic discharge orders are pended. To discharging provider:  please sign these orders via discharge navigator,  Select New Orders & click on the button choice - Manage This Unsigned Work.     Thank you for allowing pharmacy to be a part of this patient's care.  Antonietta Jewel, PharmD, [facility] Clinical Pharmacist  Phone: [phone] 10/09/2021 2:52 PM  Please check AMION for all [place] phone numbers After 10:00 PM, call [place] [phone]

## 2021-10-10 ENCOUNTER — Inpatient Hospital Stay (HOSPITAL_COMMUNITY)
Admission: RE | Admit: 2021-10-10 | Discharge: 2021-11-04 | DRG: 095 | Disposition: A | Discharge status: Other Acute Inpatient Hospital | Payer: Medicare Other | Source: Intra-hospital | Attending: Physical Medicine and Rehabilitation | Admitting: Physical Medicine and Rehabilitation

## 2021-10-10 ENCOUNTER — Encounter (HOSPITAL_COMMUNITY): Payer: Self-pay | Admitting: Physical Medicine and Rehabilitation

## 2021-10-10 DIAGNOSIS — G8221 Paraplegia, complete: Secondary | ICD-10-CM | POA: Diagnosis present

## 2021-10-10 DIAGNOSIS — B20 Human immunodeficiency virus [HIV] disease: Secondary | ICD-10-CM | POA: Diagnosis present

## 2021-10-10 DIAGNOSIS — E119 Type 2 diabetes mellitus without complications: Secondary | ICD-10-CM | POA: Diagnosis not present

## 2021-10-10 DIAGNOSIS — S24109S Unspecified injury at unspecified level of thoracic spinal cord, sequela: Secondary | ICD-10-CM

## 2021-10-10 DIAGNOSIS — E1169 Type 2 diabetes mellitus with other specified complication: Secondary | ICD-10-CM | POA: Diagnosis present

## 2021-10-10 DIAGNOSIS — Z888 Allergy status to other drugs, medicaments and biological substances status: Secondary | ICD-10-CM

## 2021-10-10 DIAGNOSIS — F5104 Psychophysiologic insomnia: Secondary | ICD-10-CM | POA: Diagnosis present

## 2021-10-10 DIAGNOSIS — L89312 Pressure ulcer of right buttock, stage 2: Secondary | ICD-10-CM | POA: Diagnosis present

## 2021-10-10 DIAGNOSIS — Y838 Other surgical procedures as the cause of abnormal reaction of the patient, or of later complication, without mention of misadventure at the time of the procedure: Secondary | ICD-10-CM | POA: Diagnosis not present

## 2021-10-10 DIAGNOSIS — T8131XA Disruption of external operation (surgical) wound, not elsewhere classified, initial encounter: Secondary | ICD-10-CM | POA: Diagnosis not present

## 2021-10-10 DIAGNOSIS — M6008 Infective myositis, other site: Secondary | ICD-10-CM

## 2021-10-10 DIAGNOSIS — Z9884 Bariatric surgery status: Secondary | ICD-10-CM | POA: Diagnosis not present

## 2021-10-10 DIAGNOSIS — R5381 Other malaise: Secondary | ICD-10-CM | POA: Diagnosis present

## 2021-10-10 DIAGNOSIS — N319 Neuromuscular dysfunction of bladder, unspecified: Secondary | ICD-10-CM

## 2021-10-10 DIAGNOSIS — M4644 Discitis, unspecified, thoracic region: Secondary | ICD-10-CM | POA: Diagnosis present

## 2021-10-10 DIAGNOSIS — R222 Localized swelling, mass and lump, trunk: Secondary | ICD-10-CM | POA: Diagnosis present

## 2021-10-10 DIAGNOSIS — N3289 Other specified disorders of bladder: Secondary | ICD-10-CM | POA: Diagnosis present

## 2021-10-10 DIAGNOSIS — M4624 Osteomyelitis of vertebra, thoracic region: Secondary | ICD-10-CM | POA: Diagnosis present

## 2021-10-10 DIAGNOSIS — D62 Acute posthemorrhagic anemia: Secondary | ICD-10-CM | POA: Diagnosis present

## 2021-10-10 DIAGNOSIS — K592 Neurogenic bowel, not elsewhere classified: Secondary | ICD-10-CM | POA: Diagnosis present

## 2021-10-10 DIAGNOSIS — G8220 Paraplegia, unspecified: Secondary | ICD-10-CM | POA: Diagnosis present

## 2021-10-10 DIAGNOSIS — Z882 Allergy status to sulfonamides status: Secondary | ICD-10-CM

## 2021-10-10 DIAGNOSIS — Z6828 Body mass index (BMI) 28.0-28.9, adult: Secondary | ICD-10-CM

## 2021-10-10 DIAGNOSIS — R252 Cramp and spasm: Secondary | ICD-10-CM | POA: Diagnosis not present

## 2021-10-10 DIAGNOSIS — M4620 Osteomyelitis of vertebra, site unspecified: Secondary | ICD-10-CM | POA: Diagnosis not present

## 2021-10-10 DIAGNOSIS — R112 Nausea with vomiting, unspecified: Secondary | ICD-10-CM

## 2021-10-10 DIAGNOSIS — Z9049 Acquired absence of other specified parts of digestive tract: Secondary | ICD-10-CM

## 2021-10-10 DIAGNOSIS — K5900 Constipation, unspecified: Secondary | ICD-10-CM | POA: Diagnosis present

## 2021-10-10 DIAGNOSIS — G061 Intraspinal abscess and granuloma: Secondary | ICD-10-CM | POA: Diagnosis present

## 2021-10-10 DIAGNOSIS — Z794 Long term (current) use of insulin: Secondary | ICD-10-CM

## 2021-10-10 DIAGNOSIS — E669 Obesity, unspecified: Secondary | ICD-10-CM | POA: Diagnosis present

## 2021-10-10 DIAGNOSIS — Z993 Dependence on wheelchair: Secondary | ICD-10-CM

## 2021-10-10 DIAGNOSIS — G062 Extradural and subdural abscess, unspecified: Secondary | ICD-10-CM | POA: Diagnosis not present

## 2021-10-10 DIAGNOSIS — G4733 Obstructive sleep apnea (adult) (pediatric): Secondary | ICD-10-CM | POA: Diagnosis present

## 2021-10-10 DIAGNOSIS — Z7984 Long term (current) use of oral hypoglycemic drugs: Secondary | ICD-10-CM

## 2021-10-10 DIAGNOSIS — G894 Chronic pain syndrome: Secondary | ICD-10-CM | POA: Diagnosis present

## 2021-10-10 DIAGNOSIS — Z79899 Other long term (current) drug therapy: Secondary | ICD-10-CM

## 2021-10-10 DIAGNOSIS — L89152 Pressure ulcer of sacral region, stage 2: Secondary | ICD-10-CM | POA: Diagnosis present

## 2021-10-10 LAB — GLUCOSE, CAPILLARY
Glucose-Capillary: 104 mg/dL — ABNORMAL HIGH (ref 70–99)
Glucose-Capillary: 94 mg/dL (ref 70–99)
Glucose-Capillary: 114 mg/dL — ABNORMAL HIGH (ref 70–99)
Glucose-Capillary: 150 mg/dL — ABNORMAL HIGH (ref 70–99)

## 2021-10-10 MED ORDER — ZINC SULFATE 220 (50 ZN) MG PO CAPS
220.0000 mg | ORAL_CAPSULE | Freq: Every day | ORAL | Status: DC
  Administered 2021-10-11 – 2021-11-03 (×24): 220 mg via ORAL
  Filled 2021-10-10 (×24): qty 1

## 2021-10-10 MED ORDER — NYSTATIN 100000 UNIT/GM EX CREA
TOPICAL_CREAM | Freq: Two times a day (BID) | CUTANEOUS | Status: DC
  Administered 2021-10-18: 1 via TOPICAL
  Filled 2021-10-10 (×4): qty 15

## 2021-10-10 MED ORDER — ADULT MULTIVITAMIN W/MINERALS CH
1.0000 | ORAL_TABLET | Freq: Two times a day (BID) | ORAL | Status: DC
  Administered 2021-10-11 – 2021-11-03 (×48): 1 via ORAL
  Filled 2021-10-10 (×49): qty 1

## 2021-10-10 MED ORDER — ALUM & MAG HYDROXIDE-SIMETH 200-200-20 MG/5ML PO SUSP: 30.0000 mL | ORAL | Status: DC | PRN

## 2021-10-10 MED ORDER — POLYETHYLENE GLYCOL 3350 17 G PO PACK
17.0000 g | PACK | Freq: Every day | ORAL | Status: DC | PRN
  Administered 2021-10-19 – 2021-10-21 (×2): 17 g via ORAL
  Filled 2021-10-10 (×4): qty 1

## 2021-10-10 MED ORDER — BACLOFEN 10 MG PO TABS
10.0000 mg | ORAL_TABLET | Freq: Three times a day (TID) | ORAL | Status: DC
  Administered 2021-10-11 – 2021-11-03 (×72): 10 mg via ORAL
  Filled 2021-10-10 (×72): qty 1

## 2021-10-10 MED ORDER — OXYCODONE-ACETAMINOPHEN 5-325 MG PO TABS
1.0000 | ORAL_TABLET | ORAL | Status: DC | PRN
  Administered 2021-10-11 – 2021-10-13 (×2): 2 via ORAL
  Administered 2021-10-14: 14:00:00 1 via ORAL
  Administered 2021-10-15 – 2021-10-26 (×5): 2 via ORAL
  Filled 2021-10-10: qty 2
  Filled 2021-10-10: qty 1
  Filled 2021-10-10 (×10): qty 2
  Filled 2021-10-10: qty 1

## 2021-10-10 MED ORDER — PROCHLORPERAZINE 25 MG RE SUPP: 12.5000 mg | Freq: Four times a day (QID) | RECTAL | Status: DC | PRN

## 2021-10-10 MED ORDER — PROSOURCE PLUS PO LIQD
30.0000 mL | Freq: Two times a day (BID) | ORAL | Status: DC
  Administered 2021-10-11 – 2021-11-03 (×48): 30 mL via ORAL
  Filled 2021-10-10 (×43): qty 30

## 2021-10-10 MED ORDER — GUAIFENESIN-DM 100-10 MG/5ML PO SYRP: 5.0000 mL | ORAL_SOLUTION | Freq: Four times a day (QID) | ORAL | Status: DC | PRN

## 2021-10-10 MED ORDER — TRAZODONE HCL 50 MG PO TABS: 25.0000 mg | ORAL_TABLET | Freq: Every evening | ORAL | Status: DC | PRN

## 2021-10-10 MED ORDER — SENNOSIDES-DOCUSATE SODIUM 8.6-50 MG PO TABS
2.0000 | ORAL_TABLET | Freq: Every day | ORAL | Status: DC
  Administered 2021-10-11 – 2021-10-15 (×5): 2 via ORAL
  Filled 2021-10-10 (×5): qty 2

## 2021-10-10 MED ORDER — FLEET ENEMA 7-19 GM/118ML RE ENEM: 1.0000 | ENEMA | Freq: Once | RECTAL | Status: DC | PRN

## 2021-10-10 MED ORDER — BICTEGRAVIR-EMTRICITAB-TENOFOV 50-200-25 MG PO TABS
1.0000 | ORAL_TABLET | Freq: Every day | ORAL | Status: DC
  Administered 2021-10-11 – 2021-11-04 (×25): 1 via ORAL
  Filled 2021-10-10 (×25): qty 1

## 2021-10-10 MED ORDER — SODIUM CHLORIDE 0.9 % IV SOLN
600.0000 mg | Freq: Every day | INTRAVENOUS | Status: DC
  Administered 2021-10-11 – 2021-11-03 (×24): 600 mg via INTRAVENOUS
  Filled 2021-10-10 (×25): qty 12

## 2021-10-10 MED ORDER — ACETAMINOPHEN 325 MG PO TABS
325.0000 mg | ORAL_TABLET | ORAL | Status: DC | PRN
Start: 2021-10-10 — End: 2021-11-04
  Administered 2021-10-14 – 2021-10-21 (×3): 650 mg via ORAL
  Filled 2021-10-10 (×3): qty 2

## 2021-10-10 MED ORDER — INSULIN ASPART 100 UNIT/ML IJ SOLN
0.0000 [IU] | Freq: Three times a day (TID) | INTRAMUSCULAR | Status: DC
  Administered 2021-10-11: 13:00:00 1 [IU] via SUBCUTANEOUS
  Administered 2021-10-12: 17:00:00 2 [IU] via SUBCUTANEOUS
  Administered 2021-10-12 – 2021-10-13 (×3): 1 [IU] via SUBCUTANEOUS
  Administered 2021-10-13 – 2021-10-14 (×3): 2 [IU] via SUBCUTANEOUS
  Administered 2021-10-15 – 2021-10-16 (×2): 1 [IU] via SUBCUTANEOUS
  Administered 2021-10-17: 3 [IU] via SUBCUTANEOUS
  Administered 2021-10-17: 2 [IU] via SUBCUTANEOUS
  Administered 2021-10-18 – 2021-10-21 (×4): 1 [IU] via SUBCUTANEOUS
  Administered 2021-10-21: 2 [IU] via SUBCUTANEOUS
  Administered 2021-10-22: 1 [IU] via SUBCUTANEOUS
  Administered 2021-10-22: 2 [IU] via SUBCUTANEOUS
  Administered 2021-10-23 – 2021-10-24 (×2): 1 [IU] via SUBCUTANEOUS
  Administered 2021-10-24: 2 [IU] via SUBCUTANEOUS
  Administered 2021-10-25: 1 [IU] via SUBCUTANEOUS
  Administered 2021-10-25 – 2021-10-26 (×2): 2 [IU] via SUBCUTANEOUS
  Administered 2021-10-27 – 2021-11-03 (×3): 1 [IU] via SUBCUTANEOUS

## 2021-10-10 MED ORDER — POLYETHYLENE GLYCOL 3350 17 G PO PACK
17.0000 g | PACK | Freq: Every day | ORAL | Status: DC
  Administered 2021-10-11 – 2021-10-20 (×10): 17 g via ORAL
  Filled 2021-10-10 (×11): qty 1

## 2021-10-10 MED ORDER — SODIUM CHLORIDE 0.9 % IV SOLN
2.0000 g | Freq: Three times a day (TID) | INTRAVENOUS | Status: DC
  Administered 2021-10-11 – 2021-10-30 (×58): 2 g via INTRAVENOUS
  Filled 2021-10-10 (×64): qty 2

## 2021-10-10 MED ORDER — ZINC OXIDE 20 % EX OINT: TOPICAL_OINTMENT | CUTANEOUS | Status: DC | PRN

## 2021-10-10 MED ORDER — DIPHENHYDRAMINE HCL 12.5 MG/5ML PO ELIX: 12.5000 mg | ORAL_SOLUTION | Freq: Four times a day (QID) | ORAL | Status: DC | PRN

## 2021-10-10 MED ORDER — BISACODYL 10 MG RE SUPP: 10.0000 mg | RECTAL | Status: DC

## 2021-10-10 MED ORDER — PROCHLORPERAZINE EDISYLATE 10 MG/2ML IJ SOLN
5.0000 mg | Freq: Four times a day (QID) | INTRAMUSCULAR | Status: DC | PRN
  Administered 2021-10-21 – 2021-10-22 (×2): 10 mg via INTRAMUSCULAR
  Filled 2021-10-10 (×2): qty 2

## 2021-10-10 MED ORDER — ASCORBIC ACID 500 MG PO TABS
500.0000 mg | ORAL_TABLET | Freq: Two times a day (BID) | ORAL | Status: DC
  Administered 2021-10-11 – 2021-11-03 (×48): 500 mg via ORAL
  Filled 2021-10-10 (×48): qty 1

## 2021-10-10 MED ORDER — PROCHLORPERAZINE MALEATE 5 MG PO TABS
5.0000 mg | ORAL_TABLET | Freq: Four times a day (QID) | ORAL | Status: DC | PRN
  Administered 2021-10-11 – 2021-10-16 (×2): 10 mg via ORAL
  Filled 2021-10-10 (×3): qty 2

## 2021-10-10 MED ORDER — OXYBUTYNIN CHLORIDE 5 MG PO TABS
5.0000 mg | ORAL_TABLET | Freq: Three times a day (TID) | ORAL | Status: DC
  Administered 2021-10-11 – 2021-10-12 (×4): 5 mg via ORAL
  Filled 2021-10-10 (×4): qty 1

## 2021-10-10 MED ORDER — ENOXAPARIN SODIUM 40 MG/0.4ML IJ SOSY
40.0000 mg | PREFILLED_SYRINGE | INTRAMUSCULAR | Status: DC
  Administered 2021-10-11 – 2021-11-03 (×24): 40 mg via SUBCUTANEOUS
  Filled 2021-10-10 (×24): qty 0.4

## 2021-10-10 MED ORDER — ENSURE MAX PROTEIN PO LIQD
11.0000 [oz_av] | Freq: Two times a day (BID) | ORAL | Status: DC
  Administered 2021-10-11 – 2021-11-01 (×37): 11 [oz_av] via ORAL
  Filled 2021-10-10: qty 330

## 2021-10-10 NOTE — Progress Notes (Signed)
Patient accepted for inpatient rehab.  She will go this afternoon.

## 2021-10-11 ENCOUNTER — Other Ambulatory Visit: Payer: Self-pay

## 2021-10-11 DIAGNOSIS — M6008 Infective myositis, other site: Secondary | ICD-10-CM

## 2021-10-11 LAB — GLUCOSE, CAPILLARY
Glucose-Capillary: 104 mg/dL — ABNORMAL HIGH (ref 70–99)
Glucose-Capillary: 129 mg/dL — ABNORMAL HIGH (ref 70–99)
Glucose-Capillary: 111 mg/dL — ABNORMAL HIGH (ref 70–99)
Glucose-Capillary: 149 mg/dL — ABNORMAL HIGH (ref 70–99)

## 2021-10-11 MED ORDER — BISACODYL 5 MG PO TBEC
5.0000 mg | DELAYED_RELEASE_TABLET | Freq: Every day | ORAL | Status: DC
  Administered 2021-10-11 – 2021-10-20 (×9): 5 mg via ORAL
  Filled 2021-10-11 (×12): qty 1

## 2021-10-11 MED ORDER — BISACODYL 10 MG RE SUPP
10.0000 mg | Freq: Every day | RECTAL | Status: DC
  Administered 2021-10-12 – 2021-10-19 (×6): 10 mg via RECTAL
  Filled 2021-10-11 (×8): qty 1

## 2021-10-11 MED ORDER — GABAPENTIN 400 MG PO CAPS
400.0000 mg | ORAL_CAPSULE | Freq: Three times a day (TID) | ORAL | Status: DC
Start: 2021-10-11 — End: 2021-10-24
  Administered 2021-10-11 – 2021-10-24 (×39): 400 mg via ORAL
  Filled 2021-10-11 (×39): qty 1
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
## 2021-10-11 NOTE — Progress Notes (Signed)
PROGRESS NOTE   Subjective/Complaints: Had a reasonable night. Didn't get here until 10pm.   ROS: Patient denies fever, rash, sore throat, blurred vision, nausea, vomiting, diarrhea, cough, shortness of breath or chest pain, joint or back pain, headache, or mood change.    Objective:   No results found. No results for input(s): WBC, HGB, HCT, PLT in the last 72 hours. No results for input(s): NA, K, CL, CO2, GLUCOSE, BUN, CREATININE, CALCIUM in the last 72 hours.  Intake/Output Summary (Last 24 hours) at 10/11/2021 0814 Last data filed at 10/11/2021 0755 Gross per 24 hour  Intake 240 ml  Output 425 ml  Net -185 ml     Pressure Injury 09/24/21 Sacrum Stage 3 -  Full thickness tissue loss. Subcutaneous fat may be visible but bone, tendon or muscle are NOT exposed. 2 wounds noted, sacral, one superior to the other (Active)  09/24/21 1030  Location: Sacrum  Location Orientation:   Staging: Stage 3 -  Full thickness tissue loss. Subcutaneous fat may be visible but bone, tendon or muscle are NOT exposed.  Wound Description (Comments): 2 wounds noted, sacral, one superior to the other  Present on Admission: Yes    Physical Exam: Vital Signs Blood pressure 96/67, pulse 100, temperature 98.3 F (36.8 C), temperature source Oral, resp. rate 18, height 5\' 2"  (1.575 m), weight 75.3 kg, last menstrual period 01/08/2015, SpO2 100 %.  General: Alert and oriented x 3, No apparent distress HEENT: Head is normocephalic, atraumatic, PERRLA, EOMI, sclera anicteric, oral mucosa pink and moist, dentition intact, ext ear canals clear,  Neck: Supple without JVD or lymphadenopathy Heart: Reg rate and rhythm. No murmurs rubs or gallops Chest: CTA bilaterally without wheezes, rales, or rhonchi; no distress Abdomen: Soft, non-tender, non-distended, bowel sounds positive. Extremities: No clubbing, cyanosis, or edema. Pulses are 2+ Psych: Pt's  affect is appropriate. Pt is cooperative Skin: back incision cdi with honeycomb dressing. Small stage II's sacrum and lower right buttock. Neuro:  Alert and oriented x 3. Normal insight and awareness. Intact Memory. Normal language and speech. Cranial nerve exam unremarkable. ~T3 sensory level without sensation below level of injury. Stocking glove sensory loss in hands. Motor 0/5 LE, 4/5 in UE with some pain inhibition, ?peripheral weakness in HI's. DTR's 1+, no resting tone Musculoskeletal: mid back tenderness    Assessment/Plan: 1. Functional deficits which require 3+ hours per day of interdisciplinary therapy in a comprehensive inpatient rehab setting. Physiatrist is providing close team supervision and 24 hour management of active medical problems listed below. Physiatrist and rehab team continue to assess barriers to discharge/monitor patient progress toward functional and medical goals  Care Tool:  Bathing              Bathing assist       Upper Body Dressing/Undressing Upper body dressing        Upper body assist      Lower Body Dressing/Undressing Lower body dressing      What is the patient wearing?: Incontinence brief     Lower body assist Assist for lower body dressing: Dependent - Patient 0%     Toileting Toileting    Toileting assist Assist  for toileting: Dependent - Patient 0%     Transfers Chair/bed transfer  Transfers assist           Locomotion Ambulation   Ambulation assist              Walk 10 feet activity   Assist           Walk 50 feet activity   Assist           Walk 150 feet activity   Assist           Walk 10 feet on uneven surface  activity   Assist           Wheelchair     Assist               Wheelchair 50 feet with 2 turns activity    Assist            Wheelchair 150 feet activity     Assist          Blood pressure 96/67, pulse 100, temperature 98.3  F (36.8 C), temperature source Oral, resp. rate 18, height 5\' 2"  (1.575 m), weight 75.3 kg, last menstrual period 01/08/2015, SpO2 100 %.  Medical Problem List and Plan: 1. Functional deficits secondary to thoracolumbar epidural abscess s/p T10-L3 laminotomy and decompression 09/28/21             -patient may shower             -ELOS/Goals: 10-12 days, sup-min assist goals at w/c level  -Patient is beginning CIR therapies today including PT, OT 2.  Antithrombotics: -DVT/anticoagulation:  Pharmaceutical: Lovenox             -antiplatelet therapy: N/a 3. Pain Management: Transition to oxycodone prn for pain.  4. Mood: LCSW to follow for evaluation and support.              -antipsychotic agents:  5. Neuropsych: This patient is capable of making decisions on her own behalf. 6. Skin/Wound Care: Pressure relief measures.  --Added ensure max and Prosource for wound healing.  7. Fluids/Electrolytes/Nutrition: encouraged PO.  -check labs tomorrow  8.  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 T2DM: Well controlled with A1C-6.0. Was on Ozempic PTA.  --CBG (last 3)  Recent Labs    10/10/21 1848 10/10/21 2113 10/11/21 0627  GLUCAP 114* 150* 104*   12/25 well controlled so far 10. HIV: Stable and undetectable viral load on Biktarvy 11. 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 OSA: Does not use CPAP 13. Morbid obesity s/p gastric bypass: Will resume multivitamin BID for supplement.  14.  Stage III sacral decub: appear to me more like a stage II, now.  Photo on file is 12/8 and appears to be Stage III. What I see now looks much better.  Will continue air mattress for pressure relief measures             -- vitamin C and Zinc to promote wound healing.              --consult WOC for input. 15. Neurogenic bowel: will  resume scheduled po dulcolax in AM and dulcolax supp in PM similar to what she does at home.  16. Neurogenic bladder:  I/O cath schedule 4-5 x daily                  LOS: 1 days A FACE TO FACE EVALUATION WAS PERFORMED  [name] 10/11/2021, 8:14 AM

## 2021-10-11 NOTE — Progress Notes (Signed)
Patient arrived to the unit about 2200. Vitals stable. A&O x4. Rehab booklet given to patient and fall safety, rehab process reviewed. All questions answered.
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
## 2021-10-12 LAB — COMPREHENSIVE METABOLIC PANEL
ALT: 10 U/L (ref 0–44)
AST: 11 U/L — ABNORMAL LOW (ref 15–41)
Albumin: 2.2 g/dL — ABNORMAL LOW (ref 3.5–5.0)
Alkaline Phosphatase: 114 U/L (ref 38–126)
Anion gap: 8 (ref 5–15)
BUN: 16 mg/dL (ref 6–20)
CO2: 21 mmol/L — ABNORMAL LOW (ref 22–32)
Calcium: 8.9 mg/dL (ref 8.9–10.3)
Chloride: 103 mmol/L (ref 98–111)
Creatinine, Ser: 0.51 mg/dL (ref 0.44–1.00)
GFR, Estimated: >60 mL/min (ref >60)
Glucose, Bld: 129 mg/dL — ABNORMAL HIGH (ref 70–99)
Potassium: 3.9 mmol/L (ref 3.5–5.1)
Sodium: 132 mmol/L — ABNORMAL LOW (ref 135–145)
Total Bilirubin: 0.4 mg/dL (ref 0.3–1.2)
Total Protein: 5.7 g/dL — ABNORMAL LOW (ref 6.5–8.1)

## 2021-10-12 LAB — CK: Total CK: 57 U/L (ref 38–234)

## 2021-10-12 LAB — CBC WITH DIFFERENTIAL/PLATELET
Abs Immature Granulocytes: 0.07 10*3/uL (ref 0.00–0.07)
Basophils Absolute: 0 10*3/uL (ref 0.0–0.1)
Basophils Relative: 1 %
Eosinophils Absolute: 0.2 10*3/uL (ref 0.0–0.5)
Eosinophils Relative: 3 %
HCT: 26.3 % — ABNORMAL LOW (ref 36.0–46.0)
Hemoglobin: 8.6 g/dL — ABNORMAL LOW (ref 12.0–15.0)
Immature Granulocytes: 1 %
Lymphocytes Relative: 34 %
Lymphs Abs: 1.9 10*3/uL (ref 0.7–4.0)
MCH: 27.2 pg (ref 26.0–34.0)
MCHC: 32.7 g/dL (ref 30.0–36.0)
MCV: 83.2 fL (ref 80.0–100.0)
Monocytes Absolute: 0.6 10*3/uL (ref 0.1–1.0)
Monocytes Relative: 11 %
Neutro Abs: 2.9 10*3/uL (ref 1.7–7.7)
Neutrophils Relative %: 50 %
Platelets: 360 10*3/uL (ref 150–400)
RBC: 3.16 MIL/uL — ABNORMAL LOW (ref 3.87–5.11)
RDW: 16.8 % — ABNORMAL HIGH (ref 11.5–15.5)
WBC: 5.7 10*3/uL (ref 4.0–10.5)
nRBC: 0 % (ref 0.0–0.2)

## 2021-10-12 LAB — GLUCOSE, CAPILLARY
Glucose-Capillary: 143 mg/dL — ABNORMAL HIGH (ref 70–99)
Glucose-Capillary: 134 mg/dL — ABNORMAL HIGH (ref 70–99)
Glucose-Capillary: 168 mg/dL — ABNORMAL HIGH (ref 70–99)
Glucose-Capillary: 172 mg/dL — ABNORMAL HIGH (ref 70–99)

## 2021-10-12 MED ORDER — MIRABEGRON ER 25 MG PO TB24
25.0000 mg | ORAL_TABLET | Freq: Every day | ORAL | Status: DC
  Administered 2021-10-12 – 2021-10-20 (×9): 25 mg via ORAL
  Filled 2021-10-12 (×9): qty 1

## 2021-10-12 NOTE — Plan of Care (Signed)
°  Problem: RH Balance Goal: LTG: Patient will maintain dynamic sitting balance (OT) Description: LTG:  Patient will maintain dynamic sitting balance with assistance during activities of daily living (OT) Flowsheets (Taken 10/12/2021 1720) LTG: Pt will maintain dynamic sitting balance during ADLs with: Supervision/Verbal cueing   Problem: RH Eating Goal: LTG Patient will perform eating w/assist, cues/equip (OT) Description: LTG: Patient will perform eating with assist, with/without cues using equipment (OT) Flowsheets (Taken 10/12/2021 1720) LTG: Pt will perform eating with assistance level of: Independent with assistive device    Problem: RH Grooming Goal: LTG Patient will perform grooming w/assist,cues/equip (OT) Description: LTG: Patient will perform grooming with assist, with/without cues using equipment (OT) Flowsheets (Taken 10/12/2021 1720) LTG: Pt will perform grooming with assistance level of: Set up assist    Problem: RH Bathing Goal: LTG Patient will bathe all body parts with assist levels (OT) Description: LTG: Patient will bathe all body parts with assist levels (OT) Flowsheets (Taken 10/12/2021 1720) LTG: Pt will perform bathing with assistance level/cueing: Minimal Assistance - Patient > 75%   Problem: RH Dressing Goal: LTG Patient will perform upper body dressing (OT) Description: LTG Patient will perform upper body dressing with assist, with/without cues (OT). Flowsheets (Taken 10/12/2021 1720) LTG: Pt will perform upper body dressing with assistance level of: (excluding orthosis) Supervision/Verbal cueing Goal: LTG Patient will perform lower body dressing w/assist (OT) Description: LTG: Patient will perform lower body dressing with assist, with/without cues in positioning using equipment (OT) Flowsheets (Taken 10/12/2021 1720) LTG: Pt will perform lower body dressing with assistance level of: Moderate Assistance - Patient 50 - 74%   Problem: RH Pre-functional/Other  (Specify) Goal: RH LTG OT (Specify) 1 Description: RH LTG OT (Specify) 1 Flowsheets (Taken 10/12/2021 1720) LTG: Other OT (Specify) 1: Pt will be able to direct her caregivers for bowel program at bed level with independence

## 2021-10-12 NOTE — Progress Notes (Signed)
PROGRESS NOTE   Subjective/Complaints:  Pt reports that spasms are an issue still- very frequent- explained that the pain she DOESN'T feel from osteomyelitis is likely the cause.   Caths in/out at home and back on bowel program like form home.  Is a complete T3.  Also having leakage/overflow from bladder- in spite of Oxybutynin- will try Myrbetriq- d/w pt.    ROS: Pt denies SOB, abd pain, CP, N/V/C/D, and vision changes    Objective:   No results found. Recent Labs    10/12/21 0617  WBC 5.7  HGB 8.6*  HCT 26.3*  PLT 360   Recent Labs    10/12/21 0617  NA 132*  K 3.9  CL 103  CO2 21*  GLUCOSE 129*  BUN 16  CREATININE 0.51  CALCIUM 8.9    Intake/Output Summary (Last 24 hours) at 10/12/2021 1046 Last data filed at 10/12/2021 0932 Gross per 24 hour  Intake 920 ml  Output 3230 ml  Net -2310 ml     Pressure Injury 09/24/21 Sacrum Stage 3 -  Full thickness tissue loss. Subcutaneous fat may be visible but bone, tendon or muscle are NOT exposed. 2 wounds noted, sacral, one superior to the other (Active)  09/24/21 1030  Location: Sacrum  Location Orientation:   Staging: Stage 3 -  Full thickness tissue loss. Subcutaneous fat may be visible but bone, tendon or muscle are NOT exposed.  Wound Description (Comments): 2 wounds noted, sacral, one superior to the other  Present on Admission: Yes    Physical Exam: Vital Signs Blood pressure 113/85, pulse 93, temperature 98.1 F (36.7 C), resp. rate 18, height '5\' 2"'  (1.575 m), weight 75.3 kg, last menstrual period 01/08/2015, SpO2 100 %.   General: awake, alert, appropriate, laying supine in bed; getting bathed by OT; NAD HENT: conjugate gaze; oropharynx moist CV: regular rate; no JVD Pulmonary: CTA B/L; no W/R/R- good air movement GI: soft, NT, ND, (+)BS Psychiatric: appropriate Neurological: Ox3; multiple spasms of Les seen during exam- at baseline- with no  movement Extremities: No clubbing, cyanosis, or edema. Pulses are 2+ Psych: Pt's affect is appropriate. Pt is cooperative Skin: back incision cdi with honeycomb dressing. Small stage II's sacrum and lower right buttock. Neuro:  Alert and oriented x 3. Normal insight and awareness. Intact Memory. Normal language and speech. Cranial nerve exam unremarkable. ~T3 sensory level without sensation below level of injury. Stocking glove sensory loss in hands. Motor 0/5 LE, 4/5 in UE with some pain inhibition, ?peripheral weakness in HI's. DTR's 1+, no resting tone Musculoskeletal: mid back tenderness  Atrophy of LE's.   Assessment/Plan: 1. Functional deficits which require 3+ hours per day of interdisciplinary therapy in a comprehensive inpatient rehab setting. Physiatrist is providing close team supervision and 24 hour management of active medical problems listed below. Physiatrist and rehab team continue to assess barriers to discharge/monitor patient progress toward functional and medical goals  Care Tool:  Bathing              Bathing assist       Upper Body Dressing/Undressing Upper body dressing        Upper body assist  Lower Body Dressing/Undressing Lower body dressing      What is the patient wearing?: Incontinence brief     Lower body assist Assist for lower body dressing: Dependent - Patient 0%     Toileting Toileting    Toileting assist Assist for toileting: Dependent - Patient 0%     Transfers Chair/bed transfer  Transfers assist           Locomotion Ambulation   Ambulation assist              Walk 10 feet activity   Assist           Walk 50 feet activity   Assist           Walk 150 feet activity   Assist           Walk 10 feet on uneven surface  activity   Assist           Wheelchair     Assist               Wheelchair 50 feet with 2 turns activity    Assist            Wheelchair  150 feet activity     Assist          Blood pressure 113/85, pulse 93, temperature 98.1 F (36.7 C), resp. rate 18, height '5\' 2"'  (1.575 m), weight 75.3 kg, last menstrual period 01/08/2015, SpO2 100 %.  Medical Problem List and Plan: 1. Functional deficits secondary to thoracolumbar epidural abscess s/p T10-L3 laminotomy and decompression 09/28/21 in setting of T3 complete paraplegia             -patient may shower             -ELOS/Goals: 10-12 days, sup-min assist goals at w/c level  Continue CIR- PT, OT - team conference tomorrow to determine length of stay 2.  Antithrombotics: -DVT/anticoagulation:  Pharmaceutical: Lovenox             -antiplatelet therapy: N/a 3. Pain Management: Transition to oxycodone prn for pain.  4. Mood: LCSW to follow for evaluation and support.              -antipsychotic agents:  5. Neuropsych: This patient is capable of making decisions on her own behalf. 6. Skin/Wound Care: Stage III on sacrum- came with wound- Pressure relief measures.  --Added ensure max and Prosource for wound healing.  7. Fluids/Electrolytes/Nutrition: encouraged PO.  12/26- I personally reviewed- labs- look good.  8.  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  9. T2DM: Well controlled with A1C-6.0. Was on Ozempic PTA.  --CBG (last 3)  Recent Labs    10/11/21 1641 10/11/21 2146 10/12/21 0639  GLUCAP 111* 149* 143*   112/26- Controlled- con't regimen 10. HIV: Stable and undetectable viral load on Biktarvy 11. 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 OSA: Does not use CPAP 13. Morbid obesity s/p gastric bypass: Will resume multivitamin BID for supplement.  14.  Stage III sacral decub: appear to me more like a stage II, now.  Photo on file is  12/8 and appears to be Stage III. What I see now looks much better.  Will  continue air mattress for pressure relief measures             -- vitamin C and Zinc to promote wound healing.              --consult WOC for input. 15. Neurogenic bowel: will resume scheduled po dulcolax in AM and dulcolax supp in PM similar to what she does at home.  16. Neurogenic bladder:             I/O cath schedule 4-5 x daily 17. Spasticity  12/26- will increase Baclofen to 10 mg TID_ might need more.  18. Bladder spasms/urinary overflow/leakage  12/26- will order Myrbetriq instead of Oxybutynin to help with bladder spasms.                    LOS: 2 days A FACE TO FACE EVALUATION WAS PERFORMED  Margareth Kanner 10/12/2021, 10:46 AM
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
## 2021-10-12 NOTE — Plan of Care (Signed)
°  Problem: RH Balance Goal: LTG Patient will maintain dynamic sitting balance (PT) Description: LTG:  Patient will maintain dynamic sitting balance with assistance during mobility activities (PT) Flowsheets (Taken 10/12/2021 1243) LTG: Pt will maintain dynamic sitting balance during mobility activities with:: Minimal Assistance - Patient > 75%   Problem: RH Bed Mobility Goal: LTG Patient will perform bed mobility with assist (PT) Description: LTG: Patient will perform bed mobility with assistance, with/without cues (PT). Flowsheets (Taken 10/12/2021 1243) LTG: Pt will perform bed mobility with assistance level of: Independent with assistive device    Problem: RH Bed to Chair Transfers Goal: LTG Patient will perform bed/chair transfers w/assist (PT) Description: LTG: Patient will perform bed to chair transfers with assistance (PT). Flowsheets (Taken 10/12/2021 1243) LTG: Pt will perform Bed to Chair Transfers with assistance level: Minimal Assistance - Patient > 75%   Problem: RH Wheelchair Mobility Goal: LTG Patient will propel w/c in controlled environment (PT) Description: LTG: Patient will propel wheelchair in controlled environment, # of feet with assist (PT) Flowsheets (Taken 10/12/2021 1243) LTG: Pt will propel w/c in controlled environ  assist needed:: Independent with assistive device LTG: Propel w/c distance in controlled environment: 150 ft Goal: LTG Patient will propel w/c in home environment (PT) Description: LTG: Patient will propel wheelchair in home environment, # of feet with assistance (PT). Flowsheets (Taken 10/12/2021 1243) LTG: Pt will propel w/c in home environ  assist needed:: Independent with assistive device LTG: Propel w/c distance in home environment: 75 ft Goal: LTG Patient will propel w/c in community environment (PT) Description: LTG: Patient will propel wheelchair in community environment, # of feet with assist (PT) Flowsheets (Taken 10/12/2021 1243) LTG:  Pt will propel w/c in community environ  assist needed:: Independent with assistive device EOF:[id number] w/c distance in community environment: 500 ft   Problem: RH Pre-functional/Other (Specify) Goal: RH LTG PT (Specify) 1 Description:  RH LTG PT (Specify) 1 Flowsheets (Taken 10/12/2021 1243) LTG: Other PT (Specify) 1: Pt will demonstrate ability to independently perform pressure relief while seated up in PWC Goal: RH LTG PT (Specify) 2 Description: RH LTG PT (Specify) 2 Flowsheets (Taken 10/12/2021 1243) LTG: Other PT (Specify) 2: Pt will independently direct caregivers to assist her with mobility and transfers as needed

## 2021-10-12 NOTE — Progress Notes (Signed)
Occupational Therapy Session Note  Patient Details  Name: Ellen Lane MRN: [id number] Date of Birth: 1966/07/14  Today's Date: 10/12/2021 OT Individual Time: 4010-2725 OT Individual Time Calculation (min): 70 min    Short Term Goals: Week 1:     Skilled Therapeutic Interventions/Progress Updates:    OT intervention initiated following consultation with evaluating OTR regarding POC. OT intervention with focus on SB tranfsers, PWC mobility, discharge planning, and safety awareness. Prior to this admission, pt was mod I with bowel program, I/O caths, transfers, and BADLs. Pt currently requires max A+2 for SB transfers and dependent for donning abdominal binder and TLSO. Supervision for PWC mobility. Pt retruned to bed at end of session. All needs within reach.   Therapy Documentation Precautions:  Precautions Precautions: Fall, Back Precaution Comments: Brace donned if HOB >30; Don brace in SUPINE with abdominal binder on first to control excess skin around trunk. Required Braces or Orthoses: Spinal Brace Spinal Brace: Thoracolumbosacral orthotic, Applied in supine position Restrictions Weight Bearing Restrictions: No Pain: Pain Assessment Pain Scale: 0-10 Pain Score: 0-No pain  Therapy/Group: Individual Therapy  Leroy Libman 10/12/2021, 2:29 PM

## 2021-10-12 NOTE — Progress Notes (Signed)
Inpatient Rehabilitation  Patient information reviewed and entered into eRehab system by Dreyah Montrose M. Virdell Hoiland, M.A., CCC/SLP, PPS Coordinator.  Information including medical coding, functional ability and quality indicators will be reviewed and updated through discharge.    
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
## 2021-10-13 ENCOUNTER — Inpatient Hospital Stay: Payer: Self-pay

## 2021-10-13 LAB — GLUCOSE, CAPILLARY
Glucose-Capillary: 117 mg/dL — ABNORMAL HIGH (ref 70–99)
Glucose-Capillary: 138 mg/dL — ABNORMAL HIGH (ref 70–99)
Glucose-Capillary: 176 mg/dL — ABNORMAL HIGH (ref 70–99)
Glucose-Capillary: 149 mg/dL — ABNORMAL HIGH (ref 70–99)

## 2021-10-13 MED ORDER — SODIUM CHLORIDE 0.9% FLUSH
10.0000 mL | INTRAVENOUS | Status: DC | PRN
  Administered 2021-10-24 – 2021-11-02 (×3): 10 mL

## 2021-10-13 MED ORDER — SODIUM CHLORIDE 0.9% FLUSH
10.0000 mL | Freq: Two times a day (BID) | INTRAVENOUS | Status: DC
  Administered 2021-10-13 – 2021-11-04 (×21): 10 mL

## 2021-10-13 MED ORDER — CHLORHEXIDINE GLUCONATE CLOTH 2 % EX PADS
6.0000 | MEDICATED_PAD | Freq: Every day | CUTANEOUS | Status: DC
  Administered 2021-10-13 – 2021-10-22 (×10): 6 via TOPICAL

## 2021-10-13 NOTE — Progress Notes (Signed)
Occupational Therapy Session Note  Patient Details  Name: Ellen Lane MRN: [id number] Date of Birth: 09/05/1966  Today's Date: 10/13/2021 OT Individual Time: 0865-7846 OT Individual Time Calculation (min): 38 min  and Today's Date: 10/13/2021 OT Missed Time: 22 Minutes Missed Time Reason: Nursing care   Short Term Goals: Week 1:  OT Short Term Goal 1 (Week 1): Pt will complete slide board transfer bed <> wheelchair with Max A of 1 in prep to participate in OOB ADL/IADL OT Short Term Goal 2 (Week 1): Pt will perform LB dress with AE PRN bed level Max A OT Short Term Goal 3 (Week 1): Pt will tolerate sitting EOM/EOB for 5 mins with no more than CGA   Skilled Therapeutic Interventions/Progress Updates:    Pt greeted at time of OT session bed level with IV nurse present, needing time to replace IV. Checking on pt every few minutes, missed 22 minutes at beginning of session 2/2 nursing care for IV placement. Pt already with abdominal binder and TLSO on from previous PT session this AM. Power chair set up at EOB per pt routine and bed mobility performed supine <> sit throughout session with 2 helpers, up to Mod/Max A initially fading to CGA/Min for sitting balance once in better alignment. Total A for slide board placement and Max/total of 1 for slide board bed <> power chair with 2nd helper for safety. Pt did have occasional LOB anteriorly and posteriorly 2/2 SCI and decreased core strength but overall able to assist with slide board transfer 2/2 familiarity with this transfer method. Spoke with nursing who said they do need to cath soon according to 5 hour schedule, pt returned to bed for this. Removed TLSO and abd binder for comfort and pt bed level call bell in reach all needs met.    Therapy Documentation Precautions:  Precautions Precautions: Fall, Back Precaution Comments: Brace donned if HOB >30; Don brace in SUPINE with abdominal binder on first to control excess skin around  trunk. Required Braces or Orthoses: Spinal Brace Spinal Brace: Thoracolumbosacral orthotic, Applied in supine position Restrictions Weight Bearing Restrictions: No     Therapy/Group: Individual Therapy  [name] 10/13/2021, 7:22 AM

## 2021-10-13 NOTE — IPOC Note (Signed)
Overall Plan of Care [facility]) Patient Details Name: Ellen Lane MRN: [id number] DOB: Jun 24, 1966  Admitting Diagnosis: Abscess of paraspinal muscles  Hospital Problems: Principal Problem:   Abscess of paraspinal muscles     Functional Problem List: Nursing Bladder, Bowel, Endurance, Medication Management, Pain, Safety, Skin Integrity, Sensory  PT Balance, Endurance, Motor, Safety, Sensory, Skin Integrity  OT Balance, Endurance, Motor, Safety, Sensory, Skin Integrity  SLP    TR         Basic ADLs: OT Eating, Grooming, Bathing, Dressing     Advanced  ADLs: OT       Transfers: PT Bed Mobility, Bed to Chair, Car, [employer], Floor  OT       Locomotion: PT Ambulation, Emergency planning/management officer, Stairs     Additional Impairments: OT None  SLP        TR      Anticipated Outcomes Item Anticipated Outcome  Self Feeding Mod I  Swallowing      Basic self-care  Min A  Toileting  Mod A   Bathroom Transfers NA, does not use toilet or shower at PLOF  Bowel/Bladder  min assist  Transfers  min A  Locomotion  min A for transfers, mod I for PWC mobility  Communication     Cognition     Pain  < 4  Safety/Judgment  min assist   Therapy Plan: PT Intensity: Minimum of 1-2 x/day ,45 to 90 minutes PT Frequency: 5 out of 7 days PT Duration Estimated Length of Stay: 21-24 days OT Intensity: Minimum of 1-2 x/day, 45 to 90 minutes OT Frequency: 5 out of 7 days OT Duration/Estimated Length of Stay: 21-24 days     Due to the current state of emergency, patients may not be receiving their 3-hours of Medicare-mandated therapy.   Team Interventions: Nursing Interventions Patient/Family Education, Bladder Management, Bowel Management, Disease Management/Prevention, Pain Management, Medication Management, Skin Care/Wound Management, Discharge Planning  PT interventions Balance/vestibular training, Community reintegration, Discharge planning, Disease management/prevention,  DME/adaptive equipment instruction, Functional mobility training, Neuromuscular re-education, Pain management, Patient/family education, Psychosocial support, Therapeutic Activities, Therapeutic Exercise, UE/LE Strength taining/ROM, UE/LE Coordination activities, Wheelchair propulsion/positioning  OT Interventions Balance/vestibular training, Discharge planning, Pain management, Self Care/advanced ADL retraining, Therapeutic Activities, UE/LE Coordination activities, Visual/perceptual remediation/compensation, Therapeutic Exercise, Skin care/wound managment, Patient/family education, Functional mobility training, Disease mangement/prevention, Cognitive remediation/compensation, Community reintegration, DME/adaptive equipment instruction, Neuromuscular re-education, Psychosocial support, Splinting/orthotics, UE/LE Strength taining/ROM, Wheelchair propulsion/positioning  SLP Interventions    TR Interventions    SW/CM Interventions     Barriers to Discharge MD  Medical stability, Home enviroment access/loayout, IV antibiotics, Neurogenic bowel and bladder, Wound care, Weight, and Weight bearing restrictions  Nursing Decreased caregiver support, Neurogenic Bowel & Bladder, Wound Care, Incontinence, Lack of/limited family support, Weight, Weight bearing restrictions, Medication compliance Lives in 1 level apartment with ramped entrance. Mother and brother will assist at discharge.  PT Neurogenic Bowel & Bladder, Other (comments), Pending surgery (back precautions) back precautions limit pt's ability to perform ADLs and bowel/bladder management independently  OT      SLP      SW       Team Discharge Planning: Destination: PT-Home ,OT- Home , SLP-  Projected Follow-up: PT-Home health PT, OT-  Home health OT, SLP-  Projected Equipment Needs: PT-None recommended by PT, OT- To be determined, SLP-  Equipment Details: PT-pt already owns all necessary equipment, OT-  Patient/family involved in discharge  planning: PT- Patient,  OT-Patient, SLP-   MD ELOS: 21-24 days  Medical Rehab Prognosis:  Good Assessment: Pt is a 55 yr old female with T3 paraplegia for 30 years and gastric bypass /BMI of 30- DM; here with 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  Goals- min to mod A    See Team Conference Notes for weekly updates to the plan of care
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
## 2021-10-13 NOTE — Patient Care Conference (Signed)
Inpatient RehabilitationTeam Conference and Plan of Care Update Date: 10/13/2021   Time: 11:16 AM    Patient Name: Ellen Lane      Medical Record Number: [id number]  Date of Birth: 04-Sep-1966 Sex: Female         Room/Bed: 4W14C/4W14C-01 Payor Info: Payor: Theme park manager MEDICARE / Plan: [facility] MEDICARE / Product Type: *No Product type* /    Admit Date/Time:  10/10/2021 10:44 PM  Primary Diagnosis:  Abscess of paraspinal muscles  Hospital Problems: Principal Problem:   Abscess of paraspinal muscles    Expected Discharge Date: Expected Discharge Date: 11/04/21  Team Members Present: Physician leading conference: Dr. Courtney Heys Social Worker Present: [name], [place] Nurse Present: Dorthula Nettles, RN PT Present: Excell Seltzer, PT OT Present: Roanna Epley, [place], OT PPS Coordinator present : Ileana Ladd, PT     Current Status/Progress Goal Weekly Team Focus  Bowel/Bladder   i/o cath q5hr; bowel program LBM:12/27  continue q5hr cath  assist with toileting needs prn   Swallow/Nutrition/ Hydration             ADL's   bathing (bed level)-tot A: UB dressing-max A; LB dressing-dependent; SB transfers-max +2  bathing-min A (w/c level and/or bed level); LB dressing-mod A; UB dressing-supervision (excluding orthosis)  BADL training, funcitonal transers; activity tolerance   Mobility   mod A rolling, +2 supine to/from sit, +2 SB transfer, Supervision PWC mobility  min A overall for transfers, bed mobility, etc; mod I PWC mobility  transfers, sitting balance, SCI edu   Communication             Safety/Cognition/ Behavioral Observations            Pain   no c/o pain  remain pain free  assess pain QS and prn   Skin   back incision with honeycomb dressing, stage 2 and DTI on buttocks  continue healing on current skin status and remain free of new breakdown  assess skin QS and prn     Discharge Planning:  Pt to be assessed. Per EMR, pt to d/c to  home with support from her brother who came from [place] to assist. SW will confirm no barriers to d/c.   Team Discussion: T3 para, osteomyelitis, and discitis. A1c looks good. Typically do fusion but not at this time. Increased bladder medications. Ordered Ted hose. Daily dressing change to back with dry dressing. I&O caths and bowel program, mod I for bowel/bladder PTA. Having spasms not pain. Stage 3 to sacrum PTA, treating appropriately. Does not want a female caregiver for bowel/bladder programs. Discharging home and brother coming in from out of town to [place] of bowel/bladder assist at home. Max assist currently, +2 bed mobility, and transfers. Has own power WC and roho cushion. Second surgery scheduled for 11/04/2021. Patient on target to meet rehab goals: yes, min assist goals.  *See Care Plan and progress notes for long and short-term goals.   Revisions to Treatment Plan:  Not at this time.  Teaching Needs: Family education, medication management, skin/wound care, bowel/bladder management, transfer training, etc.  Current Barriers to Discharge: Decreased caregiver support, Home enviroment access/layout, Incontinence, Neurogenic bowel and bladder, Wound care, Lack of/limited family support, Weight, Weight bearing restrictions, Medication compliance, and Pending surgery  Possible Resolutions to Barriers: Family education Follow-up PT/OT or HEP Order recommended DME Provide female assist for ADL's and bowel/bladder management Discharge to acute day of scheduled surgery     Medical Summary Current Status: T3  paraplegia with T9/10 osteomyelitis- on bowel program and in/out caths-  Barriers to Discharge: Decreased family/caregiver support;Home enviroment access/layout;Incontinence;Neurogenic Bowel & Bladder;Medical stability;Weight bearing restrictions;Wound care;Weight;Insurance for SNF coverage  Barriers to Discharge Comments: eval yesterday +2 SB transfers/etc- used power w/c-  was mod I prior- has her own power w/c- Possible Resolutions to [employer] Focus: has ROHO cushion- doesn't feel comofortable with man helping with bowel program/etc- brother to help- but pt doesn't want him ot hjel- has back precautions- maxA overall- has surgery 1/18- surgery scheduled- 1/18- d/c 1/18 as a result-   Continued Need for Acute Rehabilitation Level of Care: The patient requires daily medical management by a physician with specialized training in physical medicine and rehabilitation for the following reasons: Direction of a multidisciplinary physical rehabilitation program to maximize functional independence : Yes Medical management of patient stability for increased activity during participation in an intensive rehabilitation regime.: Yes Analysis of laboratory values and/or radiology reports with any subsequent need for medication adjustment and/or medical intervention. : Yes   I attest that I was present, lead the team conference, and concur with the assessment and plan of the team.   Cristi Loron 10/13/2021, 3:22 PM

## 2021-10-13 NOTE — Progress Notes (Signed)
Physical Therapy Session Note  Patient Details  Name: Ellen Lane MRN: [id number] Date of Birth: [birth year]  Today's Date: 10/13/2021 PT Individual Time: 0800-0900 PT Individual Time Calculation (min): 60 min   Short Term Goals: Week 1:  PT Short Term Goal 1 (Week 1): Pt will perform rolling L/R with min A consistently PT Short Term Goal 2 (Week 1): Pt will perform supine to/from sit with assist x 1 consistently PT Short Term Goal 3 (Week 1): Pt will perform least restrictive transfer with assist x 1 PT Short Term Goal 4 (Week 1): Pt will maintain static sitting balance x 5 min with min A  Skilled Therapeutic Interventions/Progress Updates:    Pt received supine in bed, agreeable to PT session, no complaints of pain. Pt requesting to perform bathing and dressing prior to getting up this AM. Pt is setup A for UB bathing, dependent for periarea and LB due to spinal precautions and TLSO. Pt is max A to don abdominal binder and TLSO via rolling and in supine position. Pt is setup A to don shirt, max A to don pants, TED hose, and shoes at bed level due to back precautions. Honeycomb dressing removed from pt's incision per MD request. MD also to put in order for TEDs as pt reports she wore these daily prior to admission for LE edema management. Pt left seated in bed with needs in reach at end of session.  Therapy Documentation Precautions:  Precautions Precautions: Fall, Back Precaution Comments: Brace donned if HOB >30; Don brace in SUPINE with abdominal binder on first to control excess skin around trunk. Required Braces or Orthoses: Spinal Brace Spinal Brace: Thoracolumbosacral orthotic, Applied in supine position Restrictions Weight Bearing Restrictions: No    Therapy/Group: Individual Therapy   Excell Seltzer, PT, DPT, CSRS  10/13/2021, 12:10 PM

## 2021-10-13 NOTE — Care Management (Signed)
[facility] Individual Statement of Services  Patient Name:  Ellen Lane  Date:  10/13/2021  Welcome to the [facility].  Our goal is to provide you with an individualized program based on your diagnosis and situation, designed to meet your specific needs.  With this comprehensive rehabilitation program, you will be expected to participate in at least 3 hours of rehabilitation therapies Monday-Friday, with modified therapy programming on the weekends.  Your rehabilitation program will include the following services:  Physical Therapy (PT), Occupational Therapy (OT), 24 hour per day rehabilitation nursing, Therapeutic Recreaction (TR), Psychology, Neuropsychology, Care Coordinator, Rehabilitation Medicine, [place], and Other  Weekly team conferences will be held on Tuesdays to discuss your progress.  Your Inpatient Rehabilitation Care Coordinator will talk with you frequently to get your input and to update you on team discussions.  Team conferences with you and your family in attendance may also be held.  Expected length of stay: 21-24 days    Overall anticipated outcome: Independent with an Assistive Device  Depending on your progress and recovery, your program may change. Your Inpatient Rehabilitation Care Coordinator will coordinate services and will keep you informed of any changes. Your Inpatient Rehabilitation Care Coordinator's name and contact numbers are listed  below.  The following services may also be recommended but are not provided by the [facility] will be made to provide these services after discharge if needed.  Arrangements include referral to agencies that provide these services.  Your insurance has been verified to be:  [facility] Medicare  Your  primary doctor is:  Vicenta Aly  Pertinent information will be shared with your doctor and your insurance company.  Inpatient Rehabilitation Care Coordinator:  Cathleen Corti [phone] or ([phone]  Information discussed with and copy given to patient by: Rana Snare, 10/13/2021, 11:41 AM

## 2021-10-13 NOTE — Progress Notes (Signed)
Occupational Therapy Session Note  Patient Details  Name: Sema Stangler MRN: [id number] Date of Birth: 05/14/1966  Today's Date: 10/13/2021 OT Individual Time: 5396-7289 OT Individual Time Calculation (min): 54 min    Short Term Goals: Week 1:  OT Short Term Goal 1 (Week 1): Pt will complete slide board transfer bed <> wheelchair with Max A of 1 in prep to participate in OOB ADL/IADL OT Short Term Goal 2 (Week 1): Pt will perform LB dress with AE PRN bed level Max A OT Short Term Goal 3 (Week 1): Pt will tolerate sitting EOM/EOB for 5 mins with no more than CGA  Skilled Therapeutic Interventions/Progress Updates:    Pt resting in bed upon arrival. Nursing staff had removed pt's clothing, abdominal binder, and TLSO when I/O cath completed. Pt wearing hospital gown only. OT intervention with focus on discharge planning, education, and BUE therex. Pt completed BUE circuit with 3# barbell. Discussed pt's previous level of assistance/independence. Reviewed conference discussion. Pt remained in sidelying for pressure relief. All needs within reach.  Therapy Documentation Precautions:  Precautions Precautions: Fall, Back Precaution Comments: Brace donned if HOB >30; Don brace in SUPINE with abdominal binder on first to control excess skin around trunk. Required Braces or Orthoses: Spinal Brace Spinal Brace: Thoracolumbosacral orthotic, Applied in supine position Restrictions Weight Bearing Restrictions: No Pain:  Pt denies pain this afternoon   Therapy/Group: Individual Therapy  Leroy Libman 10/13/2021, 1:56 PM

## 2021-10-13 NOTE — Progress Notes (Signed)
Patient ID: Ellen Lane, female   DOB: September 02, 1966, 55 y.o.   MRN: [id number]  SW met with pt in room to introduce self, explain role, discuss discharge process, and provide updates from team conference with d/c date 11/04/21. Pt reports her HCPOA is her mother [name] [phone]). Pt reports she has PCS with [facility] for M-W 3.5hrs and TR/F 3hrs. Pt open to SW applying for CAP. Pt reports she is able to make things work on the weekend. Pt confirms that her brother [name] will come up from [place] to provide assistance to her. Pt would like SW to look into [facility] meals delivered to the home.  [name], MSW, [place] Office: [phone] Cell: [phone] Fax: [phone]

## 2021-10-13 NOTE — Progress Notes (Signed)
Peripherally Inserted Central Catheter Placement  The IV Nurse has discussed with the patient and/or persons authorized to consent for the patient, the purpose of this procedure and the potential benefits and risks involved with this procedure.  The benefits include less needle sticks, lab draws from the catheter, and the patient may be discharged home with the catheter. Risks include, but not limited to, infection, bleeding, blood clot (thrombus formation), and puncture of an artery; nerve damage and irregular heartbeat and possibility to perform a PICC exchange if needed/ordered by physician.  Alternatives to this procedure were also discussed.  Bard Power PICC patient education guide, fact sheet on infection prevention and patient information card has been provided to patient /or left at bedside.    PICC Placement Documentation  PICC Double Lumen 36/14/43 PICC Right Basilic 38 cm (Active)  Indication for Insertion or Continuance of Line Poor Vasculature-patient has had multiple peripheral attempts or PIVs lasting less than 24 hours 10/13/21 1600  Site Assessment Clean;Dry;Intact 10/13/21 1600  Lumen #1 Status Flushed;Blood return noted 10/13/21 1600  Lumen #2 Status Flushed;Blood return noted 10/13/21 1600  Dressing Type Transparent 10/13/21 1600  Dressing Status Clean;Dry;Intact 10/13/21 1600  Antimicrobial disc in place? Yes 10/13/21 1600  Dressing Change Due 10/20/21 10/13/21 1600       Jule Economy Horton 10/13/2021, 5:04 PM

## 2021-10-13 NOTE — Progress Notes (Signed)
PROGRESS NOTE   Subjective/Complaints:  Pt reports LE swell when up in w/c.  Also worried that honeycomb hasn't been changed- explained it's see through so can see, but is due to be removed. - had PT do so today.   Per Nursing, needs PICC_ doesn't have one yet.     ROS:  Pt denies SOB, abd pain, CP, N/V/C/D, and vision changes    Objective:   [place] EKG SITE RITE  Result Date: 10/13/2021 If Site Rite image not attached, placement could not be confirmed due to current cardiac rhythm.  Recent Labs    10/12/21 0617  WBC 5.7  HGB 8.6*  HCT 26.3*  PLT 360   Recent Labs    10/12/21 0617  NA 132*  K 3.9  CL 103  CO2 21*  GLUCOSE 129*  BUN 16  CREATININE 0.51  CALCIUM 8.9    Intake/Output Summary (Last 24 hours) at 10/13/2021 1030 Last data filed at 10/13/2021 0630 Gross per 24 hour  Intake 842 ml  Output 2502 ml  Net -1660 ml     Pressure Injury 09/24/21 Sacrum Stage 3 -  Full thickness tissue loss. Subcutaneous fat may be visible but bone, tendon or muscle are NOT exposed. 2 wounds noted, sacral, one superior to the other (Active)  09/24/21 1030  Location: Sacrum  Location Orientation:   Staging: Stage 3 -  Full thickness tissue loss. Subcutaneous fat may be visible but bone, tendon or muscle are NOT exposed.  Wound Description (Comments): 2 wounds noted, sacral, one superior to the other  Present on Admission: Yes    Physical Exam: Vital Signs Blood pressure 125/70, pulse 82, temperature 98.3 F (36.8 C), resp. rate 14, height '5\' 2"'  (1.575 m), weight 75.3 kg, last menstrual period 01/08/2015, SpO2 100 %.    General: awake, alert, appropriate, on L side in bed getting bathed by PT; NAD HENT: conjugate gaze; oropharynx moist CV: regular rate; no JVD Pulmonary: CTA B/L; no W/R/R- good air movement GI: soft, NT, ND, (+)BS Psychiatric: appropriate- interactive Neurological: Ox3- a few spasms seen in  Legs B/L   Extremities: No clubbing, cyanosis, or edema. Pulses are 2+- at rest, muscle atrophy/no Edema seen Skin: back incision cdi with honeycomb dressing. No bleeding seen- will remove Small stage III's sacrum and lower right buttock. Looks smaller/not as deep as Stage III Neuro:  Alert and oriented x 3. Normal insight and awareness. Intact Memory. Normal language and speech. Cranial nerve exam unremarkable. ~T3 sensory level without sensation below level of injury. Stocking glove sensory loss in hands. Motor 0/5 LE, 4/5 in UE with some pain inhibition, ?peripheral weakness in HI's. DTR's 1+, no resting tone Musculoskeletal: mid back tenderness  Atrophy of LE's.   Assessment/Plan: 1. Functional deficits which require 3+ hours per day of interdisciplinary therapy in a comprehensive inpatient rehab setting. Physiatrist is providing close team supervision and 24 hour management of active medical problems listed below. Physiatrist and rehab team continue to assess barriers to discharge/monitor patient progress toward functional and medical goals  Care Tool:  Bathing    Body parts bathed by patient: Right arm, Left arm, Chest, Abdomen, Face   Body  parts bathed by helper: Front perineal area, Buttocks, Right upper leg, Left upper leg, Right lower leg, Left lower leg     Bathing assist Assist Level: Total Assistance - Patient < 25%     Upper Body Dressing/Undressing Upper body dressing   What is the patient wearing?: Pull over shirt, Orthosis    Upper body assist Assist Level: Maximal Assistance - Patient 25 - 49%    Lower Body Dressing/Undressing Lower body dressing      What is the patient wearing?: Incontinence brief, Pants     Lower body assist Assist for lower body dressing: Dependent - Patient 0%     Toileting Toileting    Toileting assist Assist for toileting: Dependent - Patient 0%     Transfers Chair/bed transfer  Transfers assist     Chair/bed transfer  assist level: 2 Helpers     Locomotion Ambulation   Ambulation assist   Ambulation activity did not occur: N/A          Walk 10 feet activity   Assist  Walk 10 feet activity did not occur: N/A        Walk 50 feet activity   Assist Walk 50 feet with 2 turns activity did not occur: N/A         Walk 150 feet activity   Assist Walk 150 feet activity did not occur: N/A         Walk 10 feet on uneven surface  activity   Assist Walk 10 feet on uneven surfaces activity did not occur: N/A         Wheelchair     Assist Is the patient using a wheelchair?: Yes Type of Wheelchair: Power    Wheelchair assist level: Supervision/Verbal cueing Max wheelchair distance: 150'    Wheelchair 50 feet with 2 turns activity    Assist        Assist Level: Supervision/Verbal cueing   Wheelchair 150 feet activity     Assist      Assist Level: Supervision/Verbal cueing   Blood pressure 125/70, pulse 82, temperature 98.3 F (36.8 C), resp. rate 14, height '5\' 2"'  (1.575 m), weight 75.3 kg, last menstrual period 01/08/2015, SpO2 100 %.  Medical Problem List and Plan: 1. Functional deficits secondary to thoracolumbar epidural abscess s/p T10-L3 laminotomy and decompression 09/28/21 in setting of T3 complete paraplegia             -patient may shower             -ELOS/Goals: 10-12 days, sup-min assist goals at w/c level  Con't PT and OT- CIR- team conference today to determine length of stay 2.  Antithrombotics: -DVT/anticoagulation:  Pharmaceutical: Lovenox             -antiplatelet therapy: N/a 3. Pain Management: Transition to oxycodone prn for pain. 12/27- pain controlled- con't regimen  4. Mood: LCSW to follow for evaluation and support.              -antipsychotic agents:  5. Neuropsych: This patient is capable of making decisions on her own behalf. 6. Skin/Wound Care: Stage III on sacrum- came with wound- Pressure relief measures.  --Added  ensure max and Prosource for wound healing.  7. Fluids/Electrolytes/Nutrition: encouraged PO.  12/26- I personally reviewed- labs- look good.  8.  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  9. T2DM: Well controlled with A1C-6.0. Was on Ozempic PTA.  --CBG (last 3)  Recent Labs    10/12/21 1607 10/12/21 2104 10/13/21 0602  GLUCAP 168* 172* 117*   12/27- Bgs overall controlled- con't regimen 10. HIV: Stable and undetectable viral load on Biktarvy 11. 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 OSA: Does not use CPAP 13. Morbid obesity s/p gastric bypass: Will resume multivitamin BID for supplement.  14.  Stage III sacral decub: appear to me more like a stage II, now.  Photo on file is 12/8 and appears to be Stage III. What I see now looks much better.  Will continue air mattress for pressure relief measures             -- vitamin C and Zinc to promote wound healing.              --consult WOC for input. 15. Neurogenic bowel: will resume scheduled po dulcolax in AM and dulcolax supp in PM similar to what she does at home.   12/27- going well per pt- con't regimen 16. Neurogenic bladder:             I/O cath schedule 4-5 x daily  12/27- going well except overflow- see #18 17. Spasticity  12/26- will increase Baclofen to 10 mg TID_ might need more.   12/27- will increase Baclofen if need be, on Thursday 12/29.  18. Bladder spasms/urinary overflow/leakage  12/26- will order Myrbetriq instead of Oxybutynin to help with bladder spasms.   12/27- no side effects- con't regimen                   LOS: 3 days A FACE TO FACE EVALUATION WAS PERFORMED  Vannak Montenegro 10/13/2021, 10:30 AM

## 2021-10-14 ENCOUNTER — Other Ambulatory Visit (HOSPITAL_COMMUNITY): Payer: Self-pay

## 2021-10-14 LAB — GLUCOSE, CAPILLARY
Glucose-Capillary: 153 mg/dL — ABNORMAL HIGH (ref 70–99)
Glucose-Capillary: 151 mg/dL — ABNORMAL HIGH (ref 70–99)
Glucose-Capillary: 159 mg/dL — ABNORMAL HIGH (ref 70–99)

## 2021-10-14 NOTE — Progress Notes (Signed)
PROGRESS NOTE   Subjective/Complaints: Feeling tired after working with therapy. She has no other complaints Denies pain    ROS:  Pt denies SOB, abd pain, CP, N/V/C/D, and vision changes, pain    Objective:   [place] EKG SITE RITE  Result Date: 10/13/2021 If Site Rite image not attached, placement could not be confirmed due to current cardiac rhythm.  Recent Labs    10/12/21 0617  WBC 5.7  HGB 8.6*  HCT 26.3*  PLT 360   Recent Labs    10/12/21 0617  NA 132*  K 3.9  CL 103  CO2 21*  GLUCOSE 129*  BUN 16  CREATININE 0.51  CALCIUM 8.9    Intake/Output Summary (Last 24 hours) at 10/14/2021 1437 Last data filed at 10/14/2021 1250 Gross per 24 hour  Intake 370 ml  Output 1650 ml  Net -1280 ml     Pressure Injury 09/24/21 Sacrum Stage 3 -  Full thickness tissue loss. Subcutaneous fat may be visible but bone, tendon or muscle are NOT exposed. 2 wounds noted, sacral, one superior to the other (Active)  09/24/21 1030  Location: Sacrum  Location Orientation:   Staging: Stage 3 -  Full thickness tissue loss. Subcutaneous fat may be visible but bone, tendon or muscle are NOT exposed.  Wound Description (Comments): 2 wounds noted, sacral, one superior to the other  Present on Admission: Yes    Physical Exam: Vital Signs Blood pressure 115/74, pulse 96, temperature 97.7 F (36.5 C), resp. rate 20, height '5\' 2"'  (1.575 m), weight 75.3 kg, last menstrual period 01/08/2015, SpO2 100 %.  Gen: no distress, normal appearing HEENT: oral mucosa pink and moist, NCAT Cardio: Reg rate Chest: normal effort, normal rate of breathing Abd: soft, non-distended Ext: no edema Psych: pleasant, normal affect Skin: intact Neurological: Ox3- a few spasms seen in Legs B/L   Extremities: No clubbing, cyanosis, or edema. Pulses are 2+- at rest, muscle atrophy/no Edema seen Skin: back incision cdi with honeycomb dressing. No  bleeding seen- will remove Small stage III's sacrum and lower right buttock. Looks smaller/not as deep as Stage III Neuro:  Alert and oriented x 3. Normal insight and awareness. Intact Memory. Normal language and speech. Cranial nerve exam unremarkable. ~T3 sensory level without sensation below level of injury. Stocking glove sensory loss in hands. Motor 0/5 LE, 4/5 in UE with some pain inhibition, ?peripheral weakness in HI's. DTR's 1+, no resting tone Musculoskeletal: mid back tenderness  Atrophy of LE's.   Assessment/Plan: 1. Functional deficits which require 3+ hours per day of interdisciplinary therapy in a comprehensive inpatient rehab setting. Physiatrist is providing close team supervision and 24 hour management of active medical problems listed below. Physiatrist and rehab team continue to assess barriers to discharge/monitor patient progress toward functional and medical goals  Care Tool:  Bathing    Body parts bathed by patient: Right arm, Left arm, Chest, Abdomen, Face   Body parts bathed by helper: Front perineal area, Buttocks, Right upper leg, Left upper leg, Right lower leg, Left lower leg     Bathing assist Assist Level: Total Assistance - Patient < 25%     Upper Body Dressing/Undressing  Upper body dressing   What is the patient wearing?: Pull over shirt, Orthosis    Upper body assist Assist Level: Maximal Assistance - Patient 25 - 49%    Lower Body Dressing/Undressing Lower body dressing      What is the patient wearing?: Incontinence brief, Pants     Lower body assist Assist for lower body dressing: Dependent - Patient 0%     Toileting Toileting    Toileting assist Assist for toileting: Dependent - Patient 0%     Transfers Chair/bed transfer  Transfers assist     Chair/bed transfer assist level: 2 Helpers (slide board)     Locomotion Ambulation   Ambulation assist   Ambulation activity did not occur: N/A          Walk 10 feet  activity   Assist  Walk 10 feet activity did not occur: N/A        Walk 50 feet activity   Assist Walk 50 feet with 2 turns activity did not occur: N/A         Walk 150 feet activity   Assist Walk 150 feet activity did not occur: N/A         Walk 10 feet on uneven surface  activity   Assist Walk 10 feet on uneven surfaces activity did not occur: N/A         Wheelchair     Assist Is the patient using a wheelchair?: Yes Type of Wheelchair: Power    Wheelchair assist level: Supervision/Verbal cueing Max wheelchair distance: 150'    Wheelchair 50 feet with 2 turns activity    Assist        Assist Level: Supervision/Verbal cueing   Wheelchair 150 feet activity     Assist      Assist Level: Supervision/Verbal cueing   Blood pressure 115/74, pulse 96, temperature 97.7 F (36.5 C), resp. rate 20, height '5\' 2"'  (1.575 m), weight 75.3 kg, last menstrual period 01/08/2015, SpO2 100 %.  Medical Problem List and Plan: 1. Functional deficits secondary to thoracolumbar epidural abscess s/p T10-L3 laminotomy and decompression 09/28/21 in setting of T3 complete paraplegia             -patient may shower             -ELOS/Goals: 10-12 days, sup-min assist goals at w/c level  Continue CIR- team conference today to determine length of stay 2.  Impaired mobility: Continue Lovenox             -antiplatelet therapy: N/a 3. Pain:Continue oxycodone prn for pain. 12/27- pain controlled- con't regimen  4. Mood: LCSW to follow for evaluation and support.              -antipsychotic agents:  5. Neuropsych: This patient is capable of making decisions on her own behalf. 6. Skin/Wound Care: Stage III on sacrum- came with wound- Pressure relief measures.  --Added ensure max and Prosource for wound healing.  7. Fluids/Electrolytes/Nutrition: encouraged PO.  12/26- I personally reviewed- labs- look good.  8.  T10/11 discitis with thoracolumbar epidural abscess,  osteomyelitis and instability : On IV cefepime/daptomycin for 8 weeks from OR with end date 11/22/21               12/26- will order CRP/ESR, CBC with diff and CMP weekly.  9. T2DM: Well controlled with A1C-6.0. Was on Ozempic PTA.  --CBG (last 3)  Recent Labs    10/13/21 1603 10/13/21 2204 10/14/21 0622  GLUCAP 176*  149* 153*   12/27- Bgs overall controlled- con't regimen 10. HIV: Stable and undetectable viral load on Biktarvy 11. 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 OSA: Does not use CPAP 13. Morbid obesity s/p gastric bypass: Will resume multivitamin BID for supplement.  14.  Stage III sacral decub: appear to me more like a stage II, now.  Photo on file is 12/8 and appears to be Stage III. What I see now looks much better.  Will continue air mattress for pressure relief measures             -- vitamin C and Zinc to promote wound healing.              --consult WOC for input. 15. Neurogenic bowel: will resume scheduled po dulcolax in AM and dulcolax supp in PM similar to what she does at home.   12/27- going well per pt- con't regimen 16. Neurogenic bladder:             I/O cath schedule 4-5 x daily  12/27- going well except overflow- see #18 17. Spasticity  12/26- will increase Baclofen to 10 mg TID_ might need more.   12/27- will increase Baclofen if need be, on Thursday 12/29.  18. Bladder spasms/urinary overflow/leakage  12/26- will order Myrbetriq instead of Oxybutynin to help with bladder spasms.   12/27- no side effects- con't regimen                   LOS: 4 days A FACE TO FACE EVALUATION WAS PERFORMED  Clide Deutscher Kayin Kettering 10/14/2021, 2:37 PM

## 2021-10-14 NOTE — Progress Notes (Signed)
Occupational Therapy Session Note  Patient Details  Name: Ellen Lane MRN: [id number] Date of Birth: 1966-01-02  Today's Date: 10/14/2021 OT Individual Time: 5189-8421 OT Individual Time Calculation (min): 69 min    Short Term Goals: Week 1:  OT Short Term Goal 1 (Week 1): Pt will complete slide board transfer bed <> wheelchair with Max A of 1 in prep to participate in OOB ADL/IADL OT Short Term Goal 2 (Week 1): Pt will perform LB dress with AE PRN bed level Max A OT Short Term Goal 3 (Week 1): Pt will tolerate sitting EOM/EOB for 5 mins with no more than CGA   Skilled Therapeutic Interventions/Progress Updates:    Pt greeted at time of session bed level resting ready for OT session. No pain reported. Pt able to direct care throughout session. Performed UB/LB bathing bed level prior to mobility, assist to wash BLEs entirely 2/2 back precautions and washing back while pt in side lying. Pt rolling L/R during session with Min A for LE management and use of bed rails. Drying off in same manner. OT threading pants over feet up to thigh level as pt wanted to leave down in prep for nursing to cath pt shortly. Pt able to son shirt overhead and therapist assist fully pulling down in back with pt rolling L/R. Donned TLSO in same manner and trialing this session without binder as shirt will provide buffer for skin contact. Pt assisting with TLSO in front and pulling velcro straps to tighten, education throughout on how to apply brace. Pt also requesting new sheets at this time as they had not been changed since she arrived per pt, OT changing sheets/blanket/pillow cases. Pt positioned bed level with HOB for comfort with brace on, call bell in reach all needs met.    Therapy Documentation Precautions:  Precautions Precautions: Fall, Back Precaution Comments: Brace donned if HOB >30; Don brace in [place]PINE with abdominal binder on first to control excess skin around trunk. Required Braces or  Orthoses: Spinal Brace Spinal Brace: Thoracolumbosacral orthotic, Applied in supine position Restrictions Weight Bearing Restrictions: No     Therapy/Group: Individual Therapy  [name] 10/14/2021, 7:24 AM

## 2021-10-14 NOTE — Progress Notes (Signed)
Occupational Therapy Session Note  Patient Details  Name: Ellen Lane MRN: [id number] Date of Birth: [birth year]  Today's Date: 10/14/2021 OT Individual Time: 1030-1110 OT Individual Time Calculation (min): 40 min    Short Term Goals: Week 1:  OT Short Term Goal 1 (Week 1): Pt will complete slide board transfer bed <> wheelchair with Max A of 1 in prep to participate in OOB ADL/IADL OT Short Term Goal 2 (Week 1): Pt will perform LB dress with AE PRN bed level Max A OT Short Term Goal 3 (Week 1): Pt will tolerate sitting EOM/EOB for 5 mins with no more than CGA  Skilled Therapeutic Interventions/Progress Updates:    Pt resting in [place] upon arrival. OT intervention with focus on PWC mobility and BUE therrapeutic activity. PWC mobility with supervision. Continued discharge planning. Pt with questions regarding plan after scheduled surgery on 1/18. Deferred to MD. Pt engaged in tossing ball and 1kg ball against rebounder 3x10 each. Pt commented that 1kg ball was tiring. Pt returned to room and positioned next to bed. Pt placed PWC into pressure relief mode. All needs within reach.  Therapy Documentation Precautions:  Precautions Precautions: Fall, Back Precaution Comments: Brace donned if HOB >30; Don brace in SUPINE with abdominal binder on first to control excess skin around trunk. Required Braces or Orthoses: Spinal Brace Spinal Brace: Thoracolumbosacral orthotic, Applied in supine position Restrictions Weight Bearing Restrictions: No   Pain:  Pt denies pain this morning   Therapy/Group: Individual Therapy  Leroy Libman 10/14/2021, 12:10 PM

## 2021-10-14 NOTE — Progress Notes (Signed)
Physical Therapy Session Note  Patient Details  Name: Ellen Lane MRN: [id number] Date of Birth: May 09, 1966  Today's Date: 10/14/2021 PT Individual Time: 0930-1000; 1300-1345 PT Individual Time Calculation (min): 30 min and 45 min PT Amount of Missed Time (min): 30 Minutes PT Missed Treatment Reason: Nursing care  Short Term Goals: Week 1:  PT Short Term Goal 1 (Week 1): Pt will perform rolling L/R with min A consistently PT Short Term Goal 2 (Week 1): Pt will perform supine to/from sit with assist x 1 consistently PT Short Term Goal 3 (Week 1): Pt will perform least restrictive transfer with assist x 1 PT Short Term Goal 4 (Week 1): Pt will maintain static sitting balance x 5 min with min A  Skilled Therapeutic Interventions/Progress Updates:    Session 1: Pt received supine in bed, reports she needs to have I/O cath performed prior to getting out of bed this AM, nursing aware and arrives to pt's room at start of session. Pt missed 30 min of scheduled therapy session for nursing care for I/O cathing. Discussed cathing schedule with OT clinical specialist who is going to discuss with charge nurse to schedule cathing times so as not to interfere with therapy going forwards. Pt finally received supine in bed, agreeable to PT session. No complaints of pain. Rolling L/R with min A for LE management with use of bedrail, dependent to pull pants up over hips due to back precautions. Supine to sit with assist x 2 for LE management and trunk elevation. Slide board transfer to w/c with max A x 1 and min A x 1 with BLE on w/c leg rests, cues for head/hips relationship and anterior lean with transfer. Pt very fearful of breaking back precautions during mobility, education on importance of leaning anteriorly with transfer and not breaking precautions when doing so. PWC mobility to/from therapy gym at Supervision level. Pt able to safely park [place] next to mat table to setup for transfer. Slide board  transfer w/c to/from mat table with max to total A x 2. Seated balance EOM with close Supervision at times with use of BUE propped on mat table behind pt. Per pt report prior to hospital stay she could maintain sitting balance on her bed independently with no UE support. Pt left semi-reclined in [place] in room with needs in reach at end of session. Reviewed pressure relief schedule and pt with good recall of every 15-20 min.  Session 2: Pt received seated in PWC in room, agreeable to PT session. No complaints of pain. PWC mobility to/from therapy gym at Supervision level. Slide board transfer to/from mat table with max A with cues for head/hips relationship and anterior weight shift. Session focus on sitting balance EOM with CGA to min A needed for balance with no UE support reaching outside BOS and across midline for targets. Pt requests to return to bed at end of session for I/O cathing with nursing after session. Slide board transfer back to bed with assist x 2 due to air mattress hospital bed. Sit to supine assist x 2 for trunk control and BLE management. Pt is dependent to doff TLSO at bed level, mod A to doff shirt, dependent to doff pants, TEDs, and shoes. Pt requesting to wash her UB and don new, clean gown. Pt is setup A for UB bathing and dressing. Pt left supine in bed with needs in reach at end of session.  Therapy Documentation Precautions:  Precautions Precautions: Fall, Back Precaution Comments: Brace  donned if HOB >30; Don brace in [place] with abdominal binder on first to control excess skin around trunk. Required Braces or Orthoses: Spinal Brace Spinal Brace: Thoracolumbosacral orthotic, Applied in supine position Restrictions Weight Bearing Restrictions: No General: PT Amount of Missed Time (min): 30 Minutes PT Missed Treatment Reason: Nursing care     Therapy/Group: Individual Therapy   Excell Seltzer, PT, DPT, CSRS  10/14/2021, 5:05 PM

## 2021-10-15 LAB — GLUCOSE, CAPILLARY
Glucose-Capillary: 115 mg/dL — ABNORMAL HIGH (ref 70–99)
Glucose-Capillary: 109 mg/dL — ABNORMAL HIGH (ref 70–99)
Glucose-Capillary: 128 mg/dL — ABNORMAL HIGH (ref 70–99)
Glucose-Capillary: 113 mg/dL — ABNORMAL HIGH (ref 70–99)

## 2021-10-15 MED ORDER — SODIUM CHLORIDE 0.9 % IV SOLN: INTRAVENOUS | Status: DC | PRN

## 2021-10-15 NOTE — Progress Notes (Signed)
Occupational Therapy Session Note  Patient Details  Name: Ellen Lane MRN: [id number] Date of Birth: 07-15-1966  Today's Date: 10/16/2021 OT Individual Time: 4656-8127 OT Individual Time Calculation (min): 58 min   Short Term Goals: Week 1:  OT Short Term Goal 1 (Week 1): Pt will complete slide board transfer bed <> wheelchair with Max A of 1 in prep to participate in OOB ADL/IADL OT Short Term Goal 2 (Week 1): Pt will perform LB dress with AE PRN bed level Max A OT Short Term Goal 3 (Week 1): Pt will tolerate sitting EOM/EOB for 5 mins with no more than CGA  Skilled Therapeutic Interventions/Progress Updates:    Pt greeted in bed with no c/o pain, requesting to engage in BADLs while in the sidelying position. UB self care completed with setup assistance including LH sponge. Worked on LE ROM/stretching during LB self care and pt education, instructing pt to encourage caregivers to build PROM into ADL routine and to also set pt up with [facility] mirror for daily skin inspection. Min A for rolling Rt>Lt for back precaution adherence during pericare. Total A for pants and footwear. Unable to don shirt due to PICC running, therefore pt put on a hospital gown. Setup for oral care with pt in sidelying position with props. After +2 assist for boosting up, pt remained in sidelying position, left with all needs within reach and 4 bedrails up of air mattress. Tx focus placed on adaptive self care skills, pt education, and functional activity tolerance.   Therapy Documentation Precautions:  Precautions Precautions: Fall, Back Precaution Comments: Brace donned if HOB >30; Don brace in SUPINE with abdominal binder on first to control excess skin around trunk. Required Braces or Orthoses: Spinal Brace Spinal Brace: Thoracolumbosacral orthotic, Applied in supine position Restrictions Weight Bearing Restrictions: No ADL: ADL Eating: Not assessed Grooming: Supervision/safety Upper Body Bathing:  Minimal assistance Lower Body Bathing: Dependent Upper Body Dressing: Maximal assistance Lower Body Dressing: Dependent Toileting: Not assessed   Therapy/Group: Individual Therapy  Carlise Stofer A Giles Currie 10/16/2021, 12:09 PM

## 2021-10-15 NOTE — Progress Notes (Signed)
Physical Therapy Session Note  Patient Details  Name: Ellen Lane MRN: [id number] Date of Birth: 02/15/1966  Today's Date: 10/15/2021 PT Individual Time: 1400-1515 PT Individual Time Calculation (min): 75 min   Short Term Goals: Week 1:  PT Short Term Goal 1 (Week 1): Pt will perform rolling L/R with min A consistently PT Short Term Goal 2 (Week 1): Pt will perform supine to/from sit with assist x 1 consistently PT Short Term Goal 3 (Week 1): Pt will perform least restrictive transfer with assist x 1 PT Short Term Goal 4 (Week 1): Pt will maintain static sitting balance x 5 min with min A  Skilled Therapeutic Interventions/Progress Updates: Pt presented in bed agreeable to therapy. Pt denies pain and was cath'd before session. Pt with abdominal binder and TLSO on. Pt performed supine to sit with maxA and use of bed features. PTA set up Slide board and performed Slide board transfer to power chair with maxA. Pt required min cues for hand placement and was able to lean onto this therapist for improved anterior weight shifting while maintaining spinal precautions. Pt transported self to rehab gym and performed Slide board transfer to mat to L with modA x 2. Pt then participated in seated balance activities including static sitting with BUE support then attempting single UE support. Pt required minA with BUE support then min/modA with single UE support. Pt then participated in static sit and placing cards on board with board close enough to maintain neutral. Pt was able to perform activity with overall minA for approx 3-4 minutes. After activity pt returned to power chair requiring maxA x 2 to return to chair due to fatigue. Pt transported self back to room and transferred back to bed maxA x 2. Pt noted to have some possible drainage at incision site. Nsg notified and dry dressing applied by nsg with PTA assisting with rolling pt and positioning. Pt was able to roll L/R with use of bed rail and  minA for BLE management. PTA then assisted pt with doffing pants total A and pt able to remove top with minA. Pt then able to don gown with set up. PTA placed SCDs, with pt rolling to L with minA and PTA placing pillow between legs for positioning and pillow behind back and buttock with call bell within reach and needs met.      Therapy Documentation Precautions:  Precautions Precautions: Fall, Back Precaution Comments: Brace donned if HOB >30; Don brace in SUPINE with abdominal binder on first to control excess skin around trunk. Required Braces or Orthoses: Spinal Brace Spinal Brace: Thoracolumbosacral orthotic, Applied in supine position Restrictions Weight Bearing Restrictions: No General:   Vital Signs: Therapy Vitals Temp: 98.3 F (36.8 C) Pulse Rate: 78 Resp: 18 BP: (!) 148/86 Patient Position (if appropriate): Lying Oxygen Therapy SpO2: 100 % O2 Device: Room Air Pain:   Mobility:   Locomotion :    Trunk/Postural Assessment :    Balance:   Exercises:   Other Treatments:      Therapy/Group: Individual Therapy  Leyda Vanderwerf 10/15/2021, 4:03 PM

## 2021-10-15 NOTE — Progress Notes (Signed)
Patient ID: Ellen Lane, female   DOB: 13-Jul-1966, 55 y.o.   MRN: [id number]  SW left message for [facility] CAP/DA [id number]) requesting return phone call to submit referral.   [name], MSW, [place] Office: [phone] Cell: [phone] Fax: [phone]

## 2021-10-15 NOTE — Progress Notes (Signed)
PROGRESS NOTE   Subjective/Complaints:  Pt reports took Ozempic at home- would like to restart- asked her to have family bring in and will add to list.  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  Spasms- much better on Baclofen dose.  Bladder- still a lot of overflow, but admits drinks a lot and cath volumes are >500cc frequently-  We discussed options- decided to increase caths to q4 hours and wait on increasing Myrbetriq since volumes too high.   Also pt asking about what exactly was done in surgery and what's planned.  ROS:  Pt denies SOB, abd pain, CP, N/V/C/D, and vision changes    Objective:   [place] EKG SITE RITE  Result Date: 10/13/2021 If Site Rite image not attached, placement could not be confirmed due to current cardiac rhythm.  No results for input(s): WBC, HGB, HCT, PLT in the last 72 hours.  No results for input(s): NA, K, CL, CO2, GLUCOSE, BUN, CREATININE, CALCIUM in the last 72 hours.   Intake/Output Summary (Last 24 hours) at 10/15/2021 0922 Last data filed at 10/15/2021 0750 Gross per 24 hour  Intake 340 ml  Output 1800 ml  Net -1460 ml     Pressure Injury 09/24/21 Sacrum Stage 3 -  Full thickness tissue loss. Subcutaneous fat may be visible but bone, tendon or muscle are NOT exposed. 2 wounds noted, sacral, one superior to the other (Active)  09/24/21 1030  Location: Sacrum  Location Orientation:   Staging: Stage 3 -  Full thickness tissue loss. Subcutaneous fat may be visible but bone, tendon or muscle are NOT exposed.  Wound Description (Comments): 2 wounds noted, sacral, one superior to the other  Present on Admission: Yes    Physical Exam: Vital Signs Blood pressure 113/80, pulse 84, temperature 98.2 F (36.8 C), temperature source Oral, resp. rate 18, height '5\' 2"'  (1.575 m), weight 75.3 kg, last menstrual period 01/08/2015, SpO2 100 %.   General:  awake, alert, appropriate, laying on L side- working with OT; NAD HENT: conjugate gaze; oropharynx moist CV: regular rate; no JVD Pulmonary: CTA B/L; no W/R/R- good air movement GI: soft, NT, ND, (+)BS Psychiatric: appropriate- interactive Neurological: Ox3; no spasms seen today Extremities: No clubbing, cyanosis, or edema. Pulses are 2+- at rest, muscle atrophy/no Edema seen Skin: back incision cdi with honeycomb dressing. No bleeding seen- will remove Small stage III's sacrum and lower right buttock. Looks smaller/not as deep as Stage III Neuro:  Alert and oriented x 3. Normal insight and awareness. Intact Memory. Normal language and speech. Cranial nerve exam unremarkable. ~T3 sensory level without sensation below level of injury. Stocking glove sensory loss in hands. Motor 0/5 LE, 4/5 in UE with some pain inhibition, ?peripheral weakness in HI's. DTR's 1+, no resting tone Musculoskeletal: mid back tenderness  Atrophy of LE's.   Assessment/Plan: 1. Functional deficits which require 3+ hours per day of interdisciplinary therapy in a comprehensive inpatient rehab setting. Physiatrist is providing close team supervision and 24 hour management of active medical problems listed below. Physiatrist and rehab team continue to assess barriers to discharge/monitor patient progress toward functional and medical goals  Care  Tool:  Bathing    Body parts bathed by patient: Right arm, Left arm, Chest, Abdomen, Face, Front perineal area   Body parts bathed by helper: Buttocks, Right upper leg, Left upper leg, Right lower leg, Left lower leg     Bathing assist Assist Level: Maximal Assistance - Patient 24 - 49%     Upper Body Dressing/Undressing Upper body dressing   What is the patient wearing?: Pull over shirt, Orthosis    Upper body assist Assist Level: Maximal Assistance - Patient 25 - 49%    Lower Body Dressing/Undressing Lower body dressing      What is the patient wearing?:  Incontinence brief, Pants     Lower body assist Assist for lower body dressing: Dependent - Patient 0%     Toileting Toileting    Toileting assist Assist for toileting: Dependent - Patient 0%     Transfers Chair/bed transfer  Transfers assist     Chair/bed transfer assist level: 2 Helpers (slide board)     Locomotion Ambulation   Ambulation assist   Ambulation activity did not occur: N/A          Walk 10 feet activity   Assist  Walk 10 feet activity did not occur: N/A        Walk 50 feet activity   Assist Walk 50 feet with 2 turns activity did not occur: N/A         Walk 150 feet activity   Assist Walk 150 feet activity did not occur: N/A         Walk 10 feet on uneven surface  activity   Assist Walk 10 feet on uneven surfaces activity did not occur: N/A         Wheelchair     Assist Is the patient using a wheelchair?: Yes Type of Wheelchair: Power    Wheelchair assist level: Supervision/Verbal cueing Max wheelchair distance: 150'    Wheelchair 50 feet with 2 turns activity    Assist        Assist Level: Supervision/Verbal cueing   Wheelchair 150 feet activity     Assist      Assist Level: Supervision/Verbal cueing   Blood pressure 113/80, pulse 84, temperature 98.2 F (36.8 C), temperature source Oral, resp. rate 18, height '5\' 2"'  (1.575 m), weight 75.3 kg, last menstrual period 01/08/2015, SpO2 100 %.  Medical Problem List and Plan: 1. Functional deficits secondary to thoracolumbar epidural abscess s/p T10-L3 laminotomy and decompression 09/28/21 in setting of T3 complete paraplegia             -patient may shower             -ELOS/Goals: 10-12 days, sup-min assist goals at w/c level  D/c 11/04/21 Con't CIR PT and OT 2.  Impaired mobility: Continue Lovenox             -antiplatelet therapy: N/a 3. Pain:Continue oxycodone prn for pain. 12/29- pain controlled- con't regimen 4. Mood: LCSW to follow for  evaluation and support.              -antipsychotic agents:  5. Neuropsych: This patient is capable of making decisions on her own behalf. 6. Skin/Wound Care: Stage III on sacrum- came with wound- Pressure relief measures.  --Added ensure max and Prosource for wound healing.  7. Fluids/Electrolytes/Nutrition: encouraged PO.  12/26- I personally reviewed- labs- look good.  8.  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  9. T2DM: Well controlled with A1C-6.0. Was on Ozempic PTA.  --CBG (last 3)  Recent Labs    10/14/21 1715 10/14/21 2105 10/15/21 0534  GLUCAP 151* 159* 115*   12/27- Bgs overall controlled- con't regimen 12/29- pt to have family bring in [place] for pt's own med- 1x/week- when brought in, will add to meds.  10. HIV: Stable and undetectable viral load on Biktarvy 11. 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 OSA: Does not use CPAP 13. Morbid obesity s/p gastric bypass: Will resume multivitamin BID for supplement.  14.  Stage III sacral decub: appear to me more like a stage II, now.  Photo on file is 12/8 and appears to be Stage III. What I see now looks much better.  Will continue air mattress for pressure relief measures             -- vitamin C and Zinc to promote wound healing.              --consult WOC for input. 15. Neurogenic bowel: will resume scheduled po dulcolax in AM and dulcolax supp in PM similar to what she does at home.   12/27- going well per pt- con't regimen 16. Neurogenic bladder:             I/O cath schedule 4-5 x daily  12/27- going well except overflow- see #18 17. Spasticity  12/26- will increase Baclofen to 10 mg TID_ might need more.   12/27- will increase Baclofen if need be, on Thursday 12/29.   12/29- spasms much  better controlled- con't 10 mg TID 18. Bladder spasms/urinary overflow/leakage  12/26- will order Myrbetriq instead of Oxybutynin to help with bladder spasms.   12/27- no side effects- con't regimen  12/29- will increase caths to q4 hours- if this isn't enough, then will increase Myrbetriq for overflow leakage, but her volumes also very high.                   I spent a total of 36 minutes on total care- >50% coordination of care- d/w pt about surgery she had and what's to come and OT.     LOS: 5 days A FACE TO FACE EVALUATION WAS PERFORMED  Bethany Hirt 10/15/2021, 9:22 AM

## 2021-10-15 NOTE — Progress Notes (Signed)
Occupational Therapy Session Note  Patient Details  Name: Ellen Lane MRN: [id number] Date of Birth: 1966/05/21  Today's Date: 10/15/2021 OT Individual Time: 3295-1884 OT Individual Time Calculation (min): 58 min    Short Term Goals: Week 1:  OT Short Term Goal 1 (Week 1): Pt will complete slide board transfer bed <> wheelchair with Max A of 1 in prep to participate in OOB ADL/IADL OT Short Term Goal 2 (Week 1): Pt will perform LB dress with AE PRN bed level Max A OT Short Term Goal 3 (Week 1): Pt will tolerate sitting EOM/EOB for 5 mins with no more than CGA   Skilled Therapeutic Interventions/Progress Updates:    Pt greeted at time of session semireclined bed level agreeable to OT session, no pain, wanting to perform ADL routine. Pt able to direct her self care throughout session such as assisting with washing/drying, BLE management, and sequencing to simulate most closely to home routine. Pt performs bathing and dressing bed level at home and normally sits up to bathe BLEs but as she needs brace >30* pt prefers to wash BLEs in sidelying/supine at this time. Offered to assist the pt with donning TLSO > long sitting and potential/modified circle sitting but states she would not want to do that at this time. ADL routine performed bed level with Max A for bathing tasks for buttocks and BLEs, pt able to wash periarea and back today with LHS. UB dress Max A for shirt, binder, and TLSO rolling L/R with use of bed rails. Donned pants for the pt up to thigh level as planning to have nursing cath in approx 1 hour. Brief changed at bed level as well dependently but pt able to roll well to assist. Pt wanting to wear TLSO at bed level in case she does want to raise HOB >30* and to be ready for next OT/PT session per request. Pt in sidelying, positioned with pillows to prevent skin breakdown, call bell in reach all needs met.   Therapy Documentation Precautions:  Precautions Precautions: Fall,  Back Precaution Comments: Brace donned if HOB >30; Don brace in SUPINE with abdominal binder on first to control excess skin around trunk. Required Braces or Orthoses: Spinal Brace Spinal Brace: Thoracolumbosacral orthotic, Applied in supine position Restrictions Weight Bearing Restrictions: No     Therapy/Group: Individual Therapy  [name] 10/15/2021, 7:10 AM

## 2021-10-15 NOTE — Progress Notes (Signed)
Occupational Therapy Session Note  Patient Details  Name: Ellen Lane MRN: [id number] Date of Birth: August 22, 1966  Today's Date: 10/15/2021 OT Individual Time: 1100-1155 OT Individual Time Calculation (min): 55 min    Short Term Goals: Week 1:  OT Short Term Goal 1 (Week 1): Pt will complete slide board transfer bed <> wheelchair with Max A of 1 in prep to participate in OOB ADL/IADL OT Short Term Goal 2 (Week 1): Pt will perform LB dress with AE PRN bed level Max A OT Short Term Goal 3 (Week 1): Pt will tolerate sitting EOM/EOB for 5 mins with no more than CGA  Skilled Therapeutic Interventions/Progress Updates:    Pt resting in bed upon arrival and ready to get OOB. Pt also requested to return to bed at end of session for nursing staff to complete I/O cath. Supine<>sit EOB with tot A+2. Pt able to assist with repositioning in [place]. [facility] mobility room<>gym with supervision. Pt engaged in BUE circuit therex with 2# bar 4x10. Pt tossed 1kg ball against rebounder 1x10, 2x12. Pt returned to room. SB transfer tot A+2. Rolling R/L in bed with min A for placement of pad. Pt remained in bed with all needs within reach. Therapy Documentation Precautions:  Precautions Precautions: Fall, Back Precaution Comments: Brace donned if HOB >30; Don brace in SUPINE with abdominal binder on first to control excess skin around trunk. Required Braces or Orthoses: Spinal Brace Spinal Brace: Thoracolumbosacral orthotic, Applied in supine position Restrictions Weight Bearing Restrictions: No   Pain: Pain Assessment Pain Scale: 0-10 Pain Score: 0-No pain   Therapy/Group: Individual Therapy  Leroy Libman 10/15/2021, 12:03 PM

## 2021-10-15 NOTE — Progress Notes (Addendum)
Pt looking at therapy schedule and is requesting to be cathed at 0630 instead of 0530 so that her bladder does not overflow during therapy. 0500 pt decided she preferred to be cathed at her previous schedule.

## 2021-10-15 NOTE — Progress Notes (Signed)
Therapy noted drainage to back incision. When patient returned to bed, removed binder and brace and noted scant amount of yellow/gray drainage on bed pad, some moisture on clothes and bottom of brace. Middle of incision site very slightly pulled apart. Dry dressing applied.

## 2021-10-16 LAB — GLUCOSE, CAPILLARY
Glucose-Capillary: 119 mg/dL — ABNORMAL HIGH (ref 70–99)
Glucose-Capillary: 110 mg/dL — ABNORMAL HIGH (ref 70–99)
Glucose-Capillary: 124 mg/dL — ABNORMAL HIGH (ref 70–99)
Glucose-Capillary: 198 mg/dL — ABNORMAL HIGH (ref 70–99)

## 2021-10-16 MED ORDER — SENNOSIDES-DOCUSATE SODIUM 8.6-50 MG PO TABS
2.0000 | ORAL_TABLET | Freq: Every morning | ORAL | Status: DC
  Administered 2021-10-17 – 2021-10-20 (×4): 2 via ORAL
  Filled 2021-10-16 (×4): qty 2

## 2021-10-16 MED ORDER — SORBITOL 70 % SOLN
30.0000 mL | Freq: Once | Status: AC
  Administered 2021-10-16: 17:00:00 30 mL via ORAL
  Filled 2021-10-16: qty 30

## 2021-10-16 NOTE — Progress Notes (Signed)
Occupational Therapy Session Note  Patient Details  Name: Aerial Dilley MRN: [id number] Date of Birth: [birth year]  Today's Date: 10/16/2021 OT Individual Time: 5859-2924 OT Individual Time Calculation (min): 28 min    Short Term Goals: Week 1:  OT Short Term Goal 1 (Week 1): Pt will complete slide board transfer bed <> wheelchair with Max A of 1 in prep to participate in OOB ADL/IADL OT Short Term Goal 2 (Week 1): Pt will perform LB dress with AE PRN bed level Max A OT Short Term Goal 3 (Week 1): Pt will tolerate sitting EOM/EOB for 5 mins with no more than CGA  Skilled Therapeutic Interventions/Progress Updates:    Pt resting in bed upon arrival. OT intervention with focus on sitting balance EOB, SB transfers, directing care, and safety awareness. Supine>sit EOB with tot A+2. Sitting balance EOB with tot A. SB transfer to [place] with tot A+2. BUE therex with focus on sitting balance. Pt remained in [facility] awaiting PT session. All needs within reach.   Therapy Documentation Precautions:  Precautions Precautions: Fall, Back Precaution Comments: Brace donned if HOB >30; Don brace in SUPINE with abdominal binder on first to control excess skin around trunk. Required Braces or Orthoses: Spinal Brace Spinal Brace: Thoracolumbosacral orthotic, Applied in supine position Restrictions Weight Bearing Restrictions: No Pain:  Pt denies pain this afternoon. Pt did c/o discomfort with TLSO where thoracic plate rested on chest; adjusted and straps reattached with relief noted  Therapy/Group: Individual Therapy  Leroy Libman 10/16/2021, 2:47 PM

## 2021-10-16 NOTE — Progress Notes (Signed)
Physical Therapy Session Note  Patient Details  Name: Ellen Lane MRN: [id number] Date of Birth: 1966-07-03  Today's Date: 10/16/2021 PT Individual Time: 1105-1200 and 1440-1535  PT Individual Time Calculation (min): 55 min and 55 min  Short Term Goals: Week 1:  PT Short Term Goal 1 (Week 1): Pt will perform rolling L/R with min A consistently PT Short Term Goal 2 (Week 1): Pt will perform supine to/from sit with assist x 1 consistently PT Short Term Goal 3 (Week 1): Pt will perform least restrictive transfer with assist x 1 PT Short Term Goal 4 (Week 1): Pt will maintain static sitting balance x 5 min with min A  Skilled Therapeutic Interventions/Progress Updates: Pt presented in bed agreeable to therapy. Pt denies pain at rest. Pt completed dressing of lower body but still in gown. Pt doffed gown and donned scrub shirt with minA as pt with diffiuclty pulling down back portion of top. PTA then donned abdominal binder and TLSO total A with pt requiring minA for BLE balance to roll L/R. Pt was able to secure and clasp TLSO with supervision and verbal cues. Pt then performed supine to sit with maxA and participated in seated balance activities remainder of session. Pt initially started with static sitting then progressed to reaching within small range. Pt was able to pass ball back and forth between PTA and pt as well as take small weighted ball and pass back and forth between hands. PTA providing min cues to move arms away from body for increased challenge. Pt requiring minA overall with intermittent assist due to posterior or anterior LOB. Pt also participated in reaching across midline in small range x 10 bilaterally which pt was able to perform without LOB. Pt completed sitting activity by sitting EOB while conversing with PTA ~5 min with pt using BUE for support and close supervision. Pt then returned to supine maxA and positioned for cathing. As PTA pulled down pants noted small BM, due to  time PTA notified Cheryl NT of pt's disposition. Pt left in bed at end of session with call bell within reach and needs met.   Tx2: Pt presented in [place] agreeable to therapy. Pt states some pain 6/10, no additional intervention requested with PTA inquiring throughout session. Session focused on w/c mobility with PWC and change in environment for emotional support. Pt drove [place] through hospital to [facility] entrance. Pt moved [place] slowly at elevators and for thresholds at entrance to minmize jerking movements on uneven surfaces so as to not increase pain. Pt spent some time in atrium and [facility] courtyard with pt very grateful for some time outside. While outside discussed tactics on how to manage next few weeks as pt misses grandson who she is very involved with. Discussed obtaining grounds pass to have possible meet up outside in future (if weather permitting). Pt returned to room in same manner and once in room aligned [place] to bed and was able to lean to R to allow PTA to place Slide board. Performed Slide board transfer to bed with modA x 2 with min cues for hand placement. Pt required maxA for sit to supine in bed. PTA then doffed shoes, pants, and TED hose total A. Pt rolled L/R and required minA for doffing top and donned gown with set up. Pt rolled to R and propped with pillows for positioning and between legs for comfort. Pt left in this position with call bell within reach and current needs met.  Therapy Documentation Precautions:  Precautions Precautions: Fall, Back Precaution Comments: Brace donned if HOB >30; Don brace in SUPINE with abdominal binder on first to control excess skin around trunk. Required Braces or Orthoses: Spinal Brace Spinal Brace: Thoracolumbosacral orthotic, Applied in supine position Restrictions Weight Bearing Restrictions: No General:   Vital Signs: Therapy Vitals Temp: 98.9 F (37.2 C) Temp Source: Oral Pulse Rate: 82 Resp: 17 BP: 116/87 Patient Position (if  appropriate): Lying Oxygen Therapy O2 Device: Room Air Pain:   Mobility:   Locomotion :    Trunk/Postural Assessment :    Balance:   Exercises:   Other Treatments:      Therapy/Group: Individual Therapy  Aasia Peavler 10/16/2021, 4:05 PM

## 2021-10-16 NOTE — Progress Notes (Signed)
PROGRESS NOTE   Subjective/Complaints:  Pt reports family bringing in [place] today most likely- asked her to have PA put in computer as pt own med.  Cathing q4 hours working better with schedule- volumes ~400cc, so doing better.  No BM in 3 days- will give sorbitol after therapy before bowel program.   ROS:   Pt denies SOB, abd pain, CP, N/V/C/D, and vision changes    Objective:   No results found. No results for input(s): WBC, HGB, HCT, PLT in the last 72 hours.  No results for input(s): NA, K, CL, CO2, GLUCOSE, BUN, CREATININE, CALCIUM in the last 72 hours.   Intake/Output Summary (Last 24 hours) at 10/16/2021 0828 Last data filed at 10/16/2021 0500 Gross per 24 hour  Intake 640 ml  Output 2300 ml  Net -1660 ml     Pressure Injury 09/24/21 Sacrum Stage 3 -  Full thickness tissue loss. Subcutaneous fat may be visible but bone, tendon or muscle are NOT exposed. 2 wounds noted, sacral, one superior to the other (Active)  09/24/21 1030  Location: Sacrum  Location Orientation:   Staging: Stage 3 -  Full thickness tissue loss. Subcutaneous fat may be visible but bone, tendon or muscle are NOT exposed.  Wound Description (Comments): 2 wounds noted, sacral, one superior to the other  Present on Admission: Yes    Physical Exam: Vital Signs Blood pressure 104/65, pulse 72, temperature 98.3 F (36.8 C), temperature source Oral, resp. rate 18, height _0  (1.575 m), weight 75.3 kg, last menstrual period 01/08/2015, SpO2 100 %.    General: awake, alert, appropriate, laying on L side in bed to keep off backside; NAD HENT: conjugate gaze; oropharynx moist CV: regular rate; no JVD Pulmonary: CTA B/L; no W/R/R- good air movement GI: soft, NT, distended vs protuberant, (+)BS- hypoactive;  Psychiatric: appropriate Neurological: Ox3; no spasms seen Extremities: No clubbing, cyanosis, or edema. Pulses are 2+- at rest,  muscle atrophy/no Edema seen Skin: back incision cdi with honeycomb dressing. No bleeding seen- will remove Small stage III's sacrum and lower right buttock. Looks smaller/not as deep as Stage III Neuro:  Alert and oriented x 3. Normal insight and awareness. Intact Memory. Normal language and speech. Cranial nerve exam unremarkable. ~T3 sensory level without sensation below level of injury. Stocking glove sensory loss in hands. Motor 0/5 LE, 4/5 in UE with some pain inhibition, ?peripheral weakness in HI's. DTR's 1+, no resting tone Musculoskeletal: mid back tenderness  Atrophy of LE's.   Assessment/Plan: 1. Functional deficits which require 3+ hours per day of interdisciplinary therapy in a comprehensive inpatient rehab setting. Physiatrist is providing close team supervision and 24 hour management of active medical problems listed below. Physiatrist and rehab team continue to assess barriers to discharge/monitor patient progress toward functional and medical goals  Care Tool:  Bathing    Body parts bathed by patient: Right arm, Left arm, Chest, Abdomen, Face, Front perineal area   Body parts bathed by helper: Buttocks, Right upper leg, Left upper leg, Right lower leg, Left lower leg     Bathing assist Assist Level: Maximal Assistance - Patient 24 - 49%     Upper Body  Dressing/Undressing Upper body dressing   What is the patient wearing?: Pull over shirt, Orthosis    Upper body assist Assist Level: Maximal Assistance - Patient 25 - 49%    Lower Body Dressing/Undressing Lower body dressing      What is the patient wearing?: Incontinence brief, Pants     Lower body assist Assist for lower body dressing: Dependent - Patient 0%     Toileting Toileting    Toileting assist Assist for toileting: Dependent - Patient 0%     Transfers Chair/bed transfer  Transfers assist     Chair/bed transfer assist level: 2 Helpers (slide board)      Locomotion Ambulation   Ambulation assist   Ambulation activity did not occur: N/A          Walk 10 feet activity   Assist  Walk 10 feet activity did not occur: N/A        Walk 50 feet activity   Assist Walk 50 feet with 2 turns activity did not occur: N/A         Walk 150 feet activity   Assist Walk 150 feet activity did not occur: N/A         Walk 10 feet on uneven surface  activity   Assist Walk 10 feet on uneven surfaces activity did not occur: N/A         Wheelchair     Assist Is the patient using a wheelchair?: Yes Type of Wheelchair: Power    Wheelchair assist level: Supervision/Verbal cueing Max wheelchair distance: 150'    Wheelchair 50 feet with 2 turns activity    Assist        Assist Level: Supervision/Verbal cueing   Wheelchair 150 feet activity     Assist      Assist Level: Supervision/Verbal cueing   Blood pressure 104/65, pulse 72, temperature 98.3 F (36.8 C), temperature source Oral, resp. rate 18, height _0  (1.575 m), weight 75.3 kg, last menstrual period 01/08/2015, SpO2 100 %.  Medical Problem List and Plan: 1. Functional deficits secondary to thoracolumbar epidural abscess s/p T10-L3 laminotomy and decompression 09/28/21 in setting of T3 complete paraplegia             -patient may shower             -ELOS/Goals: 10-12 days, sup-min assist goals at w/c level  D/c 11/04/21 Con't CIR- PT and OT 2.  Impaired mobility: Continue Lovenox             -antiplatelet therapy: N/a 3. Pain:Continue oxycodone prn for pain. 12/29- pain controlled- con't regimen  12/30- taking ~ 1x/day the Oxy- will con't regimen 4. Mood: LCSW to follow for evaluation and support.              -antipsychotic agents:  5. Neuropsych: This patient is capable of making decisions on her own behalf. 6. Skin/Wound Care: Stage III on sacrum- came with wound- Pressure relief measures.  --Added ensure max and Prosource for wound  healing.   12/30- pt doing a good job staying off backside when not in therapy.  7. Fluids/Electrolytes/Nutrition: encouraged PO.  12/26- I personally reviewed- labs- look good.  8.  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  9. T2DM: Well controlled  with A1C-6.0. Was on Ozempic PTA.  --CBG (last 3)  Recent Labs    10/15/21 1651 10/15/21 2102 10/16/21 0611  GLUCAP 128* 113* 119*   12/27- Bgs overall controlled- con't regimen   12/30- Bgs looking OK, but Ozempic being brought in, hopefully today per pt. Will add to her meds to take 1x/week.  10. HIV: Stable and undetectable viral load on Biktarvy 11. 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 OSA: Does not use CPAP 13. Morbid obesity s/p gastric bypass: Will resume multivitamin BID for supplement.  14.  Stage III sacral decub: appear to me more like a stage II, now.  Photo on file is 12/8 and appears to be Stage III. What I see now looks much better.  Will continue air mattress for pressure relief measures             -- vitamin C and Zinc to promote wound healing.              --consult WOC for input. 15. Neurogenic bowel: will resume scheduled po dulcolax in AM and dulcolax supp in PM similar to what she does at home.   12/27- going well per pt- con't regimen  12/30- constipated- LBM 3 days ago- will give sorbitol at 1530- and con't bowel program; will also change senokot tabs to QAM- night time not usually as effective for SCI pt's.  16. Neurogenic bladder:             I/O cath schedule 4-5 x daily  12/27- going well except overflow- see #18  12/30- doing better with caths q4 hours 17. Spasticity  12/26- will increase Baclofen to 10 mg TID_ might need more.   12/27- will increase Baclofen if need be, on  Thursday 12/29.   12/29- spasms much better controlled- con't 10 mg TID 18. Bladder spasms/urinary overflow/leakage  12/26- will order Myrbetriq instead of Oxybutynin to help with bladder spasms.   12/27- no side effects- con't regimen  12/29- will increase caths to q4 hours- if this isn't enough, then will increase Myrbetriq for overflow leakage, but her volumes also very high.   12/30- volumes ~400cc- doing much better on q4 hours- will con't.                    LOS: 6 days A FACE TO FACE EVALUATION WAS PERFORMED  Arnice Vanepps 10/16/2021, 8:28 AM

## 2021-10-17 LAB — GLUCOSE, CAPILLARY
Glucose-Capillary: 124 mg/dL — ABNORMAL HIGH (ref 70–99)
Glucose-Capillary: 119 mg/dL — ABNORMAL HIGH (ref 70–99)
Glucose-Capillary: 211 mg/dL — ABNORMAL HIGH (ref 70–99)

## 2021-10-17 NOTE — Progress Notes (Signed)
PROGRESS NOTE   Subjective/Complaints: No new complaints today No issues reported overnight Vitals stable Denies pain  ROS:   Pt denies SOB, abd pain, CP, N/V/C/D, and vision changes, pain    Objective:   No results found. No results for input(s): WBC, HGB, HCT, PLT in the last 72 hours.  No results for input(s): NA, K, CL, CO2, GLUCOSE, BUN, CREATININE, CALCIUM in the last 72 hours.   Intake/Output Summary (Last 24 hours) at 10/17/2021 1406 Last data filed at 10/17/2021 1340 Gross per 24 hour  Intake 821.21 ml  Output 750 ml  Net 71.21 ml     Pressure Injury 09/24/21 Sacrum Stage 3 -  Full thickness tissue loss. Subcutaneous fat may be visible but bone, tendon or muscle are NOT exposed. 2 wounds noted, sacral, one superior to the other (Active)  09/24/21 1030  Location: Sacrum  Location Orientation:   Staging: Stage 3 -  Full thickness tissue loss. Subcutaneous fat may be visible but bone, tendon or muscle are NOT exposed.  Wound Description (Comments): 2 wounds noted, sacral, one superior to the other  Present on Admission: Yes    Physical Exam: Vital Signs Blood pressure 104/68, pulse 73, temperature 98.5 F (36.9 C), temperature source Oral, resp. rate 18, height '5\' 2"'  (1.575 m), weight 75.3 kg, last menstrual period 01/08/2015, SpO2 100 %. Gen: no distress, normal appearing HEENT: oral mucosa pink and moist, NCAT Cardio: Reg rate Chest: normal effort, normal rate of breathing Abd: soft, non-distended Ext: no edema Psych: pleasant, normal affect Neurological: Ox3; no spasms seen Extremities: No clubbing, cyanosis, or edema. Pulses are 2+- at rest, muscle atrophy/no Edema seen Skin: back incision cdi with honeycomb dressing. No bleeding seen- will remove Small stage III's sacrum and lower right buttock. Looks smaller/not as deep as Stage III Neuro:  Alert and oriented x 3. Normal insight and awareness.  Intact Memory. Normal language and speech. Cranial nerve exam unremarkable. ~T3 sensory level without sensation below level of injury. Stocking glove sensory loss in hands. Motor 0/5 LE, 4/5 in UE with some pain inhibition, ?peripheral weakness in HI's. DTR's 1+, no resting tone Musculoskeletal: mid back tenderness  Atrophy of LE's.   Assessment/Plan: 1. Functional deficits which require 3+ hours per day of interdisciplinary therapy in a comprehensive inpatient rehab setting. Physiatrist is providing close team supervision and 24 hour management of active medical problems listed below. Physiatrist and rehab team continue to assess barriers to discharge/monitor patient progress toward functional and medical goals  Care Tool:  Bathing    Body parts bathed by patient: Right arm, Left arm, Chest, Abdomen, Face, Front perineal area   Body parts bathed by helper: Buttocks, Right upper leg, Left upper leg, Right lower leg, Left lower leg     Bathing assist Assist Level: Maximal Assistance - Patient 24 - 49%     Upper Body Dressing/Undressing Upper body dressing   What is the patient wearing?: Pull over shirt, Orthosis    Upper body assist Assist Level: Maximal Assistance - Patient 25 - 49%    Lower Body Dressing/Undressing Lower body dressing      What is the patient wearing?: Incontinence brief, Pants  Lower body assist Assist for lower body dressing: Dependent - Patient 0%     Toileting Toileting    Toileting assist Assist for toileting: Dependent - Patient 0%     Transfers Chair/bed transfer  Transfers assist     Chair/bed transfer assist level: 2 Helpers (slide board)     Locomotion Ambulation   Ambulation assist   Ambulation activity did not occur: N/A          Walk 10 feet activity   Assist  Walk 10 feet activity did not occur: N/A        Walk 50 feet activity   Assist Walk 50 feet with 2 turns activity did not occur: N/A          Walk 150 feet activity   Assist Walk 150 feet activity did not occur: N/A         Walk 10 feet on uneven surface  activity   Assist Walk 10 feet on uneven surfaces activity did not occur: N/A         Wheelchair     Assist Is the patient using a wheelchair?: Yes Type of Wheelchair: Power    Wheelchair assist level: Supervision/Verbal cueing Max wheelchair distance: 150'    Wheelchair 50 feet with 2 turns activity    Assist        Assist Level: Supervision/Verbal cueing   Wheelchair 150 feet activity     Assist      Assist Level: Supervision/Verbal cueing   Blood pressure 104/68, pulse 73, temperature 98.5 F (36.9 C), temperature source Oral, resp. rate 18, height '5\' 2"'  (1.575 m), weight 75.3 kg, last menstrual period 01/08/2015, SpO2 100 %.  Medical Problem List and Plan: 1. Functional deficits secondary to thoracolumbar epidural abscess s/p T10-L3 laminotomy and decompression 09/28/21 in setting of T3 complete paraplegia             -patient may shower             -ELOS/Goals: 10-12 days, sup-min assist goals at w/c level  D/c 11/04/21 Continue CIR- PT and OT 2.  Impaired mobility: Continue Lovenox             -antiplatelet therapy: N/a 3. Pain: Continue oxycodone prn for pain. 12/29- pain controlled- con't regimen  12/30- taking ~ 1x/day the Oxy- will con't regimen 4. Mood: LCSW to follow for evaluation and support.              -antipsychotic agents:  5. Neuropsych: This patient is capable of making decisions on her own behalf. 6. 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  --Continue ensure max and Prosource for wound healing.   12/30- pt doing a good job staying off backside when not in therapy.  7. Fluids/Electrolytes/Nutrition: encouraged PO.  12/26- I personally reviewed- labs- look good.  8.  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  9. T2DM: Well controlled with A1C-6.0. Was on Ozempic PTA.  --CBG (last 3)  Recent Labs    10/16/21 2059 10/17/21 0603 10/17/21 1112  GLUCAP 198* 124* 119*   12/27- Bgs overall controlled- con't regimen   12/30- Bgs looking OK, but Ozempic being brought in, hopefully today per pt. Will add to her meds to take  1x/week.  10. HIV: Stable and undetectable viral load on Biktarvy 11. 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 OSA: Does not use CPAP 13. Morbid obesity s/p gastric bypass: Will resume multivitamin BID for supplement.  14.  Stage III sacral decub: appear to me more like a stage II, now.  Photo on file is 12/8 and appears to be Stage III. What I see now looks much better.  Will continue air mattress for pressure relief measures             -- vitamin C and Zinc to promote wound healing.              --consult WOC for input. 15. Neurogenic bowel: will resume scheduled po dulcolax in AM and dulcolax supp in PM similar to what she does at home.   12/27- going well per pt- con't regimen  12/30- constipated- LBM 3 days ago- will give sorbitol at 1530- and con't bowel program; will also change senokot tabs to QAM- night time not usually as effective for SCI pt's.  16. Neurogenic bladder:             I/O cath schedule 4-5 x daily  12/27- going well except overflow- see #18  12/30- doing better with caths q4 hours 17. Spasticity  12/26- will increase Baclofen to 10 mg TID_ might need more.   12/27- will increase Baclofen if need be, on Thursday 12/29.   12/29- spasms much better controlled- con't 10 mg TID 18. Bladder spasms/urinary overflow/leakage  12/26- will order Myrbetriq instead of Oxybutynin to help with bladder spasms.   12/27- no side effects- con't regimen  12/29- will increase caths to q4 hours- if this isn't enough,  then will increase Myrbetriq for overflow leakage, but her volumes also very high.   12/30- volumes ~400cc- doing much better on q4 hours- will con't.                    LOS: 7 days A FACE TO FACE EVALUATION WAS PERFORMED  Breniyah Romm P Lyra Alaimo 10/17/2021, 2:06 PM

## 2021-10-18 LAB — GLUCOSE, CAPILLARY
Glucose-Capillary: 123 mg/dL — ABNORMAL HIGH (ref 70–99)
Glucose-Capillary: 130 mg/dL — ABNORMAL HIGH (ref 70–99)
Glucose-Capillary: 116 mg/dL — ABNORMAL HIGH (ref 70–99)
Glucose-Capillary: 162 mg/dL — ABNORMAL HIGH (ref 70–99)

## 2021-10-18 NOTE — Progress Notes (Signed)
Occupational Therapy Session Note  Patient Details  Name: Ellen Lane MRN: [id number] Date of Birth: 02/06/1966  Today's Date: 10/19/2021 OT Individual Time: 3212-2482 OT Individual Time Calculation (min): 57 min    Short Term Goals: Week 1:  OT Short Term Goal 1 (Week 1): Pt will complete slide board transfer bed <> wheelchair with Max A of 1 in prep to participate in OOB ADL/IADL OT Short Term Goal 2 (Week 1): Pt will perform LB dress with AE PRN bed level Max A OT Short Term Goal 3 (Week 1): Pt will tolerate sitting EOM/EOB for 5 mins with no more than CGA  Skilled Therapeutic Interventions/Progress Updates:    Pt greeted in bed with no c/o pain. Agreeable to engage in BADLs this AM. While in sidelying, pt completed most of UB/LB bathing, assistance required for LEs and pericare in the back. During LB bathing, worked on LE stretching/ROM, noted limited ankle dorsiflexion. Per pt, she was doing her own self ROM PTA, noticed decreased ankle ROM since then. Advised RN to pursue [facility] boot order to improve positioning at rest. Also issued pt with a leg lifter and showed her how to use it to work on gentle ankle mobility. Min A for rolling Rt>Lt for back precaution adherence throughout ADL. Max A for UB dressing including TLSO and abdominal binder, Total A for LB dressing. Note that during perihygiene pt with incontinent BM and hygiene needed to be performed a 2nd time, also notified RN to come in to change her wound dressing at this time. Pt remained in care of RN at close of session. Tx focus placed on adaptive self care skills, pt education, and back precaution adherence during functional activity.   Therapy Documentation Precautions:  Precautions Precautions: Fall, Back Precaution Comments: Brace donned if HOB >30; Don brace in SUPINE with abdominal binder on first to control excess skin around trunk. Required Braces or Orthoses: Spinal Brace Spinal Brace: Thoracolumbosacral  orthotic, Applied in supine position Restrictions Weight Bearing Restrictions: No ADL: ADL Eating: Not assessed Grooming: Supervision/safety Upper Body Bathing: Minimal assistance Lower Body Bathing: Dependent Upper Body Dressing: Maximal assistance Lower Body Dressing: Dependent Toileting: Not assessed   Therapy/Group: Individual Therapy  Ritesh Opara A Doroteo Nickolson 10/19/2021, 12:24 PM

## 2021-10-18 NOTE — Progress Notes (Signed)
Physical Therapy Session Note  Patient Details  Name: Ellen Lane MRN: [id number] Date of Birth: [birth year]  Today's Date: 10/18/2021 PT Individual Time: 0800-0900 PT Individual Time Calculation (min): 60 min   Short Term Goals: Week 1:  PT Short Term Goal 1 (Week 1): Pt will perform rolling L/R with min A consistently PT Short Term Goal 2 (Week 1): Pt will perform supine to/from sit with assist x 1 consistently PT Short Term Goal 3 (Week 1): Pt will perform least restrictive transfer with assist x 1 PT Short Term Goal 4 (Week 1): Pt will maintain static sitting balance x 5 min with min A  Skilled Therapeutic Interventions/Progress Updates:    Pt received in L sidelying in bed, agreeable to PT session. Pt reports not sleeping well due to nerve pain in BUE, pain improved this AM. Pt requesting to perform bathing and dressing for her first session this AM. Rolling L/R with min A and use of bedrail for position changes in bed while performing bathing and dressing at bed level. Pt is setup A for UB bathing and dressing except for assist needed to wash her back. Pt is dependent to wash LB and don knee high TEDs, pants, and shoes due to back precautions. Pt is dependent to don abdominal binder and TLSO via rolling. Nursing in room to administer AM meds and change dressing to wounds. Pt to remain in bed at end of session due to I/O cath scheduled after therapy session. Pt left supine in bed with needs in reach at end of session.  Therapy Documentation Precautions:  Precautions Precautions: Fall, Back Precaution Comments: Brace donned if HOB >30; Don brace in SUPINE with abdominal binder on first to control excess skin around trunk. Required Braces or Orthoses: Spinal Brace Spinal Brace: Thoracolumbosacral orthotic, Applied in supine position Restrictions Weight Bearing Restrictions: No      Therapy/Group: Individual Therapy   Excell Seltzer, PT, DPT, CSRS  10/18/2021, 10:41 AM

## 2021-10-18 NOTE — Progress Notes (Signed)
Occupational Therapy Session Note  Patient Details  Name: Ellen Lane MRN: [id number] Date of Birth: 07/25/1966  Today's Date: 10/18/2021 OT Individual Time: 8127-5170 OT Individual Time Calculation (min): 59 min    Short Term Goals: Week 1:  OT Short Term Goal 1 (Week 1): Pt will complete slide board transfer bed <> wheelchair with Max A of 1 in prep to participate in OOB ADL/IADL OT Short Term Goal 2 (Week 1): Pt will perform LB dress with AE PRN bed level Max A OT Short Term Goal 3 (Week 1): Pt will tolerate sitting EOM/EOB for 5 mins with no more than CGA   Skilled Therapeutic Interventions/Progress Updates:    Pt greeted at time of session semireclined bed level with TLSO already on and ready for OT session. No pain and ADL completed this AM with PT. Checked brief at beginning of session - clean and dry. Focus of session on bed mobiltiy with Min A to manage BLEs 2/2 back precautions for pt to sit EOB supine <> sit x2 with Max A. Sitting EOB, needing Max A to adjust hips to prevent bending and to place feet on the floor. Air mattress deflated to work on core strength and sitting balance activities. Initially having pt walk out hands to knee level to improve body awareness and decrease support in sitting. Pt performing peg board task at table top primarily using RUE to reach in various planes and heights for pegs and place in corresponding pattern, did so for 30/30 pegs with no LOB and Min A for sitting balance. Assisted pt with switching out hair cover for new one, cutting out waterproof layer and setting up with her comb and wash cloths for grooming tasks as she preferred to do this after OT session. Pt performing bed mobility again Min A for rolling for OT to remove TLSO and binder. Pt at bed level at 30* only set up for task. Call bell in reach and bed reinflated.   Therapy Documentation Precautions:  Precautions Precautions: Fall, Back Precaution Comments: Brace donned if HOB  >30; Don brace in SUPINE with abdominal binder on first to control excess skin around trunk. Required Braces or Orthoses: Spinal Brace Spinal Brace: Thoracolumbosacral orthotic, Applied in supine position Restrictions Weight Bearing Restrictions: No    Therapy/Group: Individual Therapy  [name] 10/18/2021, 12:51 PM

## 2021-10-19 LAB — GLUCOSE, CAPILLARY
Glucose-Capillary: 119 mg/dL — ABNORMAL HIGH (ref 70–99)
Glucose-Capillary: 115 mg/dL — ABNORMAL HIGH (ref 70–99)
Glucose-Capillary: 149 mg/dL — ABNORMAL HIGH (ref 70–99)
Glucose-Capillary: 125 mg/dL — ABNORMAL HIGH (ref 70–99)

## 2021-10-19 LAB — CBC WITH DIFFERENTIAL/PLATELET
Abs Immature Granulocytes: 0.02 10*3/uL (ref 0.00–0.07)
Basophils Absolute: 0 10*3/uL (ref 0.0–0.1)
Basophils Relative: 0 %
Eosinophils Absolute: 0.3 10*3/uL (ref 0.0–0.5)
Eosinophils Relative: 5 %
HCT: 28.5 % — ABNORMAL LOW (ref 36.0–46.0)
Hemoglobin: 9.2 g/dL — ABNORMAL LOW (ref 12.0–15.0)
Immature Granulocytes: 0 %
Lymphocytes Relative: 40 %
Lymphs Abs: 2 10*3/uL (ref 0.7–4.0)
MCH: 27.2 pg (ref 26.0–34.0)
MCHC: 32.3 g/dL (ref 30.0–36.0)
MCV: 84.3 fL (ref 80.0–100.0)
Monocytes Absolute: 0.5 10*3/uL (ref 0.1–1.0)
Monocytes Relative: 10 %
Neutro Abs: 2.2 10*3/uL (ref 1.7–7.7)
Neutrophils Relative %: 45 %
Platelets: 266 10*3/uL (ref 150–400)
RBC: 3.38 MIL/uL — ABNORMAL LOW (ref 3.87–5.11)
RDW: 16.4 % — ABNORMAL HIGH (ref 11.5–15.5)
WBC: 5.1 10*3/uL (ref 4.0–10.5)
nRBC: 0 % (ref 0.0–0.2)

## 2021-10-19 LAB — COMPREHENSIVE METABOLIC PANEL
ALT: 21 U/L (ref 0–44)
AST: 29 U/L (ref 15–41)
Albumin: 2.4 g/dL — ABNORMAL LOW (ref 3.5–5.0)
Alkaline Phosphatase: 124 U/L (ref 38–126)
Anion gap: 6 (ref 5–15)
BUN: 17 mg/dL (ref 6–20)
CO2: 24 mmol/L (ref 22–32)
Calcium: 9.3 mg/dL (ref 8.9–10.3)
Chloride: 106 mmol/L (ref 98–111)
Creatinine, Ser: 0.57 mg/dL (ref 0.44–1.00)
GFR, Estimated: >60 mL/min (ref >60)
Glucose, Bld: 108 mg/dL — ABNORMAL HIGH (ref 70–99)
Potassium: 4.2 mmol/L (ref 3.5–5.1)
Sodium: 136 mmol/L (ref 135–145)
Total Bilirubin: 0.2 mg/dL — ABNORMAL LOW (ref 0.3–1.2)
Total Protein: 5.8 g/dL — ABNORMAL LOW (ref 6.5–8.1)

## 2021-10-19 LAB — CK: Total CK: 518 U/L — ABNORMAL HIGH (ref 38–234)

## 2021-10-19 LAB — SEDIMENTATION RATE: Sed Rate: 42 mm/hr — ABNORMAL HIGH (ref 0–22)

## 2021-10-19 LAB — C-REACTIVE PROTEIN: CRP: 0.6 mg/dL (ref <1.0)

## 2021-10-19 NOTE — Progress Notes (Signed)
Pt slept through the night only awakening for scheduled caths and every 2 hour turns.

## 2021-10-19 NOTE — Progress Notes (Signed)
PROGRESS NOTE   Subjective/Complaints:  Having BM- usually does at night with bowel program.  Not cathing or doing own bowel program due to back precautions.  Is worried how to do at home.   ROS:  Pt denies SOB, abd pain, CP, N/V/C/D, and vision changes   Objective:   No results found. Recent Labs    10/19/21 0323  WBC 5.1  HGB 9.2*  HCT 28.5*  PLT 266    Recent Labs    10/19/21 0323  NA 136  K 4.2  CL 106  CO2 24  GLUCOSE 108*  BUN 17  CREATININE 0.57  CALCIUM 9.3     Intake/Output Summary (Last 24 hours) at 10/19/2021 1056 Last data filed at 10/19/2021 0930 Gross per 24 hour  Intake 360 ml  Output 3500 ml  Net -3140 ml     Pressure Injury 09/24/21 Sacrum Stage 3 -  Full thickness tissue loss. Subcutaneous fat may be visible but bone, tendon or muscle are NOT exposed. 2 wounds noted, sacral, one superior to the other (Active)  09/24/21 1030  Location: Sacrum  Location Orientation:   Staging: Stage 3 -  Full thickness tissue loss. Subcutaneous fat may be visible but bone, tendon or muscle are NOT exposed.  Wound Description (Comments): 2 wounds noted, sacral, one superior to the other  Present on Admission: Yes    Physical Exam: Vital Signs Blood pressure 129/80, pulse 71, temperature 98.6 F (37 C), temperature source Oral, resp. rate 14, height '5\' 2"'  (1.575 m), weight 75.3 kg, last menstrual period 01/08/2015, SpO2 100 %.    General: awake, alert, appropriate, on L side in bed; getting cleaned up by OT, nurse; NAD HENT: conjugate gaze; oropharynx moist CV: regular rate; no JVD Pulmonary: CTA B/L; no W/R/R- good air movement GI: soft, NT, ND, (+)BS Psychiatric: appropriate Neurological: Ox3- no spasms seen Extremities: No clubbing, cyanosis, or edema. Pulses are 2+- at rest, muscle atrophy/no Edema seen Skin: back incision cdi ; Coccyx- a little small scabbed over ulcer- looks unstageable,  but only partial thickness- very superficial- dime sized Neuro:  Alert and oriented x 3. Normal insight and awareness. Intact Memory. Normal language and speech. Cranial nerve exam unremarkable. ~T3 sensory level without sensation below level of injury. Stocking glove sensory loss in hands. Motor 0/5 LE, 4/5 in UE with some pain inhibition, ?peripheral weakness in HI's. DTR's 1+, no resting tone Musculoskeletal: mid back tenderness  Atrophy of LE's.   Assessment/Plan: 1. Functional deficits which require 3+ hours per day of interdisciplinary therapy in a comprehensive inpatient rehab setting. Physiatrist is providing close team supervision and 24 hour management of active medical problems listed below. Physiatrist and rehab team continue to assess barriers to discharge/monitor patient progress toward functional and medical goals  Care Tool:  Bathing    Body parts bathed by patient: Right arm, Left arm, Chest, Abdomen, Face, Front perineal area   Body parts bathed by helper: Buttocks, Right upper leg, Left upper leg, Right lower leg, Left lower leg     Bathing assist Assist Level: Maximal Assistance - Patient 24 - 49%     Upper Body Dressing/Undressing Upper body dressing  What is the patient wearing?: Pull over shirt, Orthosis    Upper body assist Assist Level: Maximal Assistance - Patient 25 - 49%    Lower Body Dressing/Undressing Lower body dressing      What is the patient wearing?: Incontinence brief, Pants     Lower body assist Assist for lower body dressing: Maximal Assistance - Patient 25 - 49%     Toileting Toileting    Toileting assist Assist for toileting: Total Assistance - Patient < 25%     Transfers Chair/bed transfer  Transfers assist     Chair/bed transfer assist level: 2 Helpers (slide board)     Locomotion Ambulation   Ambulation assist   Ambulation activity did not occur: N/A          Walk 10 feet activity   Assist  Walk 10 feet  activity did not occur: N/A        Walk 50 feet activity   Assist Walk 50 feet with 2 turns activity did not occur: N/A         Walk 150 feet activity   Assist Walk 150 feet activity did not occur: N/A         Walk 10 feet on uneven surface  activity   Assist Walk 10 feet on uneven surfaces activity did not occur: N/A         Wheelchair     Assist Is the patient using a wheelchair?: Yes Type of Wheelchair: Power    Wheelchair assist level: Supervision/Verbal cueing Max wheelchair distance: 150'    Wheelchair 50 feet with 2 turns activity    Assist        Assist Level: Supervision/Verbal cueing   Wheelchair 150 feet activity     Assist      Assist Level: Supervision/Verbal cueing   Blood pressure 129/80, pulse 71, temperature 98.6 F (37 C), temperature source Oral, resp. rate 14, height '5\' 2"'  (1.575 m), weight 75.3 kg, last menstrual period 01/08/2015, SpO2 100 %.  Medical Problem List and Plan: 1. Functional deficits secondary to thoracolumbar epidural abscess s/p T10-L3 laminotomy and decompression 09/28/21 in setting of T3 complete paraplegia             -patient may shower             -ELOS/Goals: 10-12 days, sup-min assist goals at w/c level  D/c 11/04/21 Will order PRAFOs for pt- con't CIR- PT and OT 2.  Impaired mobility: Continue Lovenox             -antiplatelet therapy: N/a 3. Pain: Continue oxycodone prn for pain. 12/29- pain controlled- con't regimen  12/30- taking ~ 1x/day the Oxy- will con't regimen 4. Mood: LCSW to follow for evaluation and support.              -antipsychotic agents:  5. Neuropsych: This patient is capable of making decisions on her own behalf. 6. 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  --Continue ensure max and Prosource for wound healing.   12/30- pt doing a good job staying off backside when not in therapy.  1/2- will need to go home on Air mattress until Stage III  healed completely.  7. Fluids/Electrolytes/Nutrition: encouraged PO.  1/2- labs look great- con't to monitor 8.  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  9. T2DM: Well controlled with A1C-6.0. Was on Ozempic PTA.  --CBG (last 3)  Recent Labs    10/18/21 1626 10/18/21 2040 10/19/21 0612  GLUCAP 116* 162* 119*   12/27- Bgs overall controlled- con't regimen   12/30- Bgs looking OK, but Ozempic being brought in, hopefully today per pt. Will add to her meds to take 1x/week.   1/2- CBGs doing better- con't regimen 10. HIV: Stable and undetectable viral load on Biktarvy 11. 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 OSA: Does not use CPAP 13. Morbid obesity s/p gastric bypass: Will resume multivitamin BID for supplement.  14.  Stage III sacral decub: appear to me more like a stage II, now.  Photo on file is 12/8 and appears to be Stage III. What I see now looks much better.  Will continue air mattress for pressure relief measures             -- vitamin C and Zinc to promote wound healing.              --consult WOC for input. 15. Neurogenic bowel: will resume scheduled po dulcolax in AM and dulcolax supp in PM similar to what she does at home.   12/27- going well per pt- con't regimen  12/30- constipated- LBM 3 days ago- will give sorbitol at 1530- and con't bowel program; will also change senokot tabs to QAM- night time not usually as effective for SCI pt's.   1/2- LBM this AM 16. Neurogenic bladder:             I/O cath schedule 4-5 x daily  12/27- going well except overflow- see #18  12/30- doing better with caths q4 hours 17. Spasticity  12/26- will increase Baclofen to 10 mg TID_ might need more.   12/27- will increase Baclofen if need be, on Thursday 12/29.    12/29- spasms much better controlled- con't 10 mg TID 18. Bladder spasms/urinary overflow/leakage  12/26- will order Myrbetriq instead of Oxybutynin to help with bladder spasms.   12/27- no side effects- con't regimen  12/29- will increase caths to q4 hours- if this isn't enough, then will increase Myrbetriq for overflow leakage, but her volumes also very high.   12/30- volumes ~400cc- doing much better on q4 hours- will con't.                    LOS: 9 days A FACE TO FACE EVALUATION WAS PERFORMED  Mayda Shippee 10/19/2021, 10:56 AM

## 2021-10-19 NOTE — Progress Notes (Signed)
Orthopedic Tech Progress Note Patient Details:  Ellen Lane 1966-06-17 [id number]  Called in order to [employer] for a pair of PRAFO BOOTS   Patient ID: Ellen Lane, female   DOB: 1966-08-29, 56 y.o.   MRN: [id number]  Ellen Lane 10/19/2021, 3:55 PM

## 2021-10-19 NOTE — Progress Notes (Signed)
Physical Therapy Session Note  Patient Details  Name: Ellen Lane MRN: [id number] Date of Birth: [birth year]  Today's Date: 10/19/2021 PT Individual Time: 1015-1100; 2119-4174 PT Individual Time Calculation (min): 45 min and 53 min PT Missed Time: 7 min Missed Time Reason: nursing care (I/O cath)  Short Term Goals: Week 1:  PT Short Term Goal 1 (Week 1): Pt will perform rolling L/R with min A consistently PT Short Term Goal 2 (Week 1): Pt will perform supine to/from sit with assist x 1 consistently PT Short Term Goal 3 (Week 1): Pt will perform least restrictive transfer with assist x 1 PT Short Term Goal 4 (Week 1): Pt will maintain static sitting balance x 5 min with min A  Skilled Therapeutic Interventions/Progress Updates:    Session 1: Pt received seated in bed, agreeable to PT session. No complaints of pain. Pt agreeable to bed level therapy this AM due to no +2 available. Pt reports she is having increase in BLE tightness and is unable to perform her daily stretching program that she was doing at home independently prior to hospital admission due to current back precautions. Performed BLE PROM in all available planes of motion to hips, knees, and ankles. Pt had been provided with leg lifter from OT. Utilized leg lifter to perform B gastroc and HS stretches with increased independence. Secure chat sent to MD to request PRAFOs to prevent foot drop. Pt left seated in bed with needs in reach at end of session.  Session 2: Pt received sidelying in bed, agreeable to PT session. No complaints of pain. Rolling with min A. Supine to sit with assist x 2 for LE management and trunk elevation, HOB elevated and use of bedrail. Slide board transfer bed to w/c with mod A x 2. PWC mobility to/from dayroom at Supervision level. Session focus on slide board transfers w/c to/from mat table (to the L out of chair) similar to pt's home setup. Pt requires only min A for scooting on slide board during  transfer but does require assist from a 2nd person to place board and to assist with maintaining trunk control as pt tends to fall posteriorly due to momentum during transfer. Pt returned to bed at end of session. Sit to supine assist x 2 for LE management and trunk control. Rolling L/R with min A for removal of TLSO and abdominal binder. Pt requesting to change into gown and out of scrubs,obtained gown for patient. Nursing then present in room for I/O cath. Pt handed off to nursing for remainder of care, missed 7 min of session.  Therapy Documentation Precautions:  Precautions Precautions: Fall, Back Precaution Comments: Brace donned if HOB >30; Don brace in SUPINE with abdominal binder on first to control excess skin around trunk. Required Braces or Orthoses: Spinal Brace Spinal Brace: Thoracolumbosacral orthotic, Applied in supine position Restrictions Weight Bearing Restrictions: No      Therapy/Group: Individual Therapy   Excell Seltzer, PT, DPT, CSRS  10/19/2021, 12:14 PM

## 2021-10-19 NOTE — Progress Notes (Signed)
Occupational Therapy Session Note  Patient Details  Name: Ellen Lane MRN: [id number] Date of Birth: 03-26-1966  Today's Date: 10/19/2021 OT Individual Time: 1135-1210 OT Individual Time Calculation (min): 35 min    Short Term Goals: Week 1:  OT Short Term Goal 1 (Week 1): Pt will complete slide board transfer bed <> wheelchair with Max A of 1 in prep to participate in OOB ADL/IADL OT Short Term Goal 2 (Week 1): Pt will perform LB dress with AE PRN bed level Max A OT Short Term Goal 3 (Week 1): Pt will tolerate sitting EOM/EOB for 5 mins with no more than CGA  Skilled Therapeutic Interventions/Progress Updates:    Pt received in bed. Used this session from problem solving toileting strategies and pt education on resources.  Pt explained in detail her process of performing her bowel routine at home so we could figure out she could do this now with her back precautions.  Pt really wants to be able to do this herself.  Obtained toilet aid tongs. Showed pt how to wrap washcloths around tongs and how to change out cloths.  Pt said at home she uses wipes.  Pt felt it would be best to practice with the OTs only for now.  Discussed her support systems at home, she had good family support.   Also helped her down load an instagram page "wheel with me" that follows people with SCI from all over the country that share advice, equipment recommendations, etc.  Pt resting in bed with all needs met.   Therapy Documentation Precautions:  Precautions Precautions: Fall, Back Precaution Comments: Brace donned if HOB >30; Don brace in SUPINE with abdominal binder on first to control excess skin around trunk. Required Braces or Orthoses: Spinal Brace Spinal Brace: Thoracolumbosacral orthotic, Applied in supine position Restrictions Weight Bearing Restrictions: No   Pain: Pain Assessment Pain Scale: 0-10 Pain Score: 0-No pain ADL: ADL Eating: Not assessed Grooming: Supervision/safety Upper  Body Bathing: Minimal assistance Lower Body Bathing: Dependent Upper Body Dressing: Maximal assistance Lower Body Dressing: Dependent Toileting: Not assessed  Therapy/Group: Individual Therapy  [name] 10/19/2021, 11:01 AM

## 2021-10-19 NOTE — Progress Notes (Signed)
Physical Therapy Weekly Progress Note  Patient Details  Name: Ellen Lane MRN: [id number] Date of Birth: 1966/08/03  Beginning of progress report period: October 12, 2021 End of progress report period: October 19, 2021  Patient has met 2 of 4 short term goals.  Pt is making slow but steady progress towards rehab goals. She is currently at min A level for rolling in bed with use of bedrails, assist x 2 for supine to/from sit, and has progressed from total A x 2 to mod A x 2 for slide board transfers at the most. Pt remains limited by her TLSO brace as well as her back precautions which limit her mobility and her independence with tasks she was able to perform independently prior to her surgery. Sessions have focused on problem-solving new ways for patient to perform tasks more independently while wearing her brace and while adhering to her back precautions. Pt remains very motivated and exhibits great participation in therapy sessions.  Patient continues to demonstrate the following deficits muscle weakness, muscle joint tightness, and muscle paralysis, decreased cardiorespiratoy endurance, abnormal tone, unbalanced muscle activation, and decreased coordination, and decreased sitting balance, decreased postural control, and decreased balance strategies and therefore will continue to benefit from skilled PT intervention to increase functional independence with mobility.  Patient progressing toward long term goals..  Continue plan of care.  PT Short Term Goals Week 1:  PT Short Term Goal 1 (Week 1): Pt will perform rolling L/R with min A consistently PT Short Term Goal 1 - Progress (Week 1): Met PT Short Term Goal 2 (Week 1): Pt will perform supine to/from sit with assist x 1 consistently PT Short Term Goal 2 - Progress (Week 1): Progressing toward goal PT Short Term Goal 3 (Week 1): Pt will perform least restrictive transfer with assist x 1 PT Short Term Goal 3 - Progress (Week 1):  Progressing toward goal PT Short Term Goal 4 (Week 1): Pt will maintain static sitting balance x 5 min with min A PT Short Term Goal 4 - Progress (Week 1): Met Week 2:  PT Short Term Goal 1 (Week 2): Pt will perform bed mobility with assist x 1 PT Short Term Goal 2 (Week 2): Pt will perform least restrictive transfer with assist x 1 PT Short Term Goal 3 (Week 2): Pt will recall pressure relief schedule independently   Therapy Documentation Precautions:  Precautions Precautions: Fall, Back Precaution Comments: Brace donned if HOB >30; Don brace in SUPINE with abdominal binder on first to control excess skin around trunk. Required Braces or Orthoses: Spinal Brace Spinal Brace: Thoracolumbosacral orthotic, Applied in supine position Restrictions Weight Bearing Restrictions: No    Therapy/Group: Individual Therapy   Excell Seltzer, PT, DPT, CSRS  10/19/2021, 7:38 AM

## 2021-10-20 ENCOUNTER — Encounter (HOSPITAL_BASED_OUTPATIENT_CLINIC_OR_DEPARTMENT_OTHER): Payer: Medicaid Other | Admitting: Internal Medicine

## 2021-10-20 LAB — GLUCOSE, CAPILLARY
Glucose-Capillary: 103 mg/dL — ABNORMAL HIGH (ref 70–99)
Glucose-Capillary: 144 mg/dL — ABNORMAL HIGH (ref 70–99)
Glucose-Capillary: 117 mg/dL — ABNORMAL HIGH (ref 70–99)
Glucose-Capillary: 206 mg/dL — ABNORMAL HIGH (ref 70–99)

## 2021-10-20 MED ORDER — BISACODYL 10 MG RE SUPP
10.0000 mg | Freq: Every day | RECTAL | Status: DC
  Administered 2021-10-20 – 2021-11-03 (×14): 10 mg via RECTAL
  Filled 2021-10-20 (×16): qty 1

## 2021-10-20 MED ORDER — BISACODYL 5 MG PO TBEC
5.0000 mg | DELAYED_RELEASE_TABLET | Freq: Every day | ORAL | Status: DC
  Administered 2021-10-21 – 2021-10-22 (×2): 5 mg via ORAL
  Filled 2021-10-20 (×2): qty 1

## 2021-10-20 MED ORDER — MIRABEGRON ER 50 MG PO TB24
50.0000 mg | ORAL_TABLET | Freq: Every day | ORAL | Status: DC
  Administered 2021-10-21 – 2021-11-03 (×14): 50 mg via ORAL
  Filled 2021-10-20 (×15): qty 1

## 2021-10-20 MED ORDER — SENNOSIDES-DOCUSATE SODIUM 8.6-50 MG PO TABS
2.0000 | ORAL_TABLET | Freq: Every day | ORAL | Status: DC
  Administered 2021-10-21 – 2021-10-22 (×2): 2 via ORAL
  Filled 2021-10-20 (×2): qty 2

## 2021-10-20 MED ORDER — POLYETHYLENE GLYCOL 3350 17 G PO PACK
17.0000 g | PACK | Freq: Every day | ORAL | Status: DC
  Administered 2021-10-22: 17 g via ORAL
  Filled 2021-10-20 (×3): qty 1

## 2021-10-20 NOTE — Progress Notes (Signed)
Physical Therapy Session Note  Patient Details  Name: Ellen Lane MRN: [id number] Date of Birth: 1965/11/05  Today's Date: 10/20/2021 PT Individual Time: 0950-1100; 6599-3570 PT Individual Time Calculation (min): 70 min and 60 min  Short Term Goals: Week 2:  PT Short Term Goal 1 (Week 2): Pt will perform bed mobility with assist x 1 PT Short Term Goal 2 (Week 2): Pt will perform least restrictive transfer with assist x 1 PT Short Term Goal 3 (Week 2): Pt will recall pressure relief schedule independently  Skilled Therapeutic Interventions/Progress Updates:    Session 1: Pt received seated in bed, agreeable to PT session. No complaints of pain this AM. Pt is able to do her hair with setup A at bed level with assist needed to hold mirror in place for patient. Reviewed BLE stretching with use of leg lifter for B ankle and HS stretches. Supine to sit with max A needed for BLE management and trunk elevation with HOB elevated and use of bedrail. Pt is min to mod A for sitting balance EOB on air mattress while PWC setup for transfer. Slide board transfer bed to w/c with mod A for trunk control and balance. Remainder of session focus on PWC mobility and maneuvering PWC on/off elevator safely and practicing parking next to bed for transfers at Supervision level with cues for safety. Pt left seated in PWC in room with needs in reach at end of session.  Session 2: Pt received seated in bed, agreeable to PT session. No complaints of pain. Supine to sit with max A for BLE management and trunk elevation. Session focus on sitting balance EOB with close Supervision needed for static sitting balance with use of BUE. Pt able to take unweighted ball and pass from L to R behind her body and back to the front, x 6 reps, 2 x 10 reps with CGA for sitting balance. Seated reaching outside BOS for targets with alt UE with CGA for sitting balance. Pt returned to supine with mod A needed for BLE management. Rolling L/R  with min A for LE management for removal of pants, abdominal binder, TLSO dependently. Assisted pt with doffing TED hose and shoes, donned non-skid socks and PRAFOs. Pt left in L sidelying in bed with needs in reach at end of session.  Therapy Documentation Precautions:  Precautions Precautions: Fall, Back Precaution Comments: Brace donned if HOB >30; Don brace in SUPINE with abdominal binder on first to control excess skin around trunk. Required Braces or Orthoses: Spinal Brace Spinal Brace: Thoracolumbosacral orthotic, Applied in supine position Restrictions Weight Bearing Restrictions: No      Therapy/Group: Individual Therapy   Excell Seltzer, PT, DPT, CSRS  10/20/2021, 11:13 AM

## 2021-10-20 NOTE — Progress Notes (Addendum)
Prior to bowel program patient had a medium bowel movement.  Bowel program initiated this shift, patient tolerated well. Oncoming nurse aware.

## 2021-10-20 NOTE — Patient Care Conference (Signed)
Inpatient RehabilitationTeam Conference and Plan of Care Update Date: 10/20/2021   Time: 11:13 AM    Patient Name: Ellen Lane      Medical Record Number: [id number]  Date of Birth: [birth year] Sex: Female         Room/Bed: 4W14C/4W14C-01 Payor Info: Payor: [employer] MEDICARE / Plan: [facility] MEDICARE / Product Type: *No Product type* /    Admit Date/Time:  10/10/2021 10:44 PM  Primary Diagnosis:  Abscess of paraspinal muscles  Hospital Problems: Principal Problem:   Abscess of paraspinal muscles    Expected Discharge Date: Expected Discharge Date: 11/04/21  Team Members Present: Physician leading conference: Dr. Courtney Heys Social Worker Present: [name], [place] Nurse Present: Dorthula Nettles, RN PT Present: Excell Seltzer, PT OT Present: Roanna Epley, COTA;Jennifer Tamala Julian, OT PPS Coordinator present : Gunnar Fusi, SLP     Current Status/Progress Goal Weekly Team Focus  Bowel/Bladder   Patient is I/O cath Q5hrs, bowel program continue, LBM 11/18/21  continue q5hr cath  Maintain patient home 1/O cath schedule, and toileting needs, continue Bowel program   Swallow/Nutrition/ Hydration             ADL's   bahting/dressing at bed level-max A: SB transfers-mod A+2; toileting-dependent  bathing-min A (w/c level and/or bed level); LB dressing-mod A; UB dressing-supervision (excluding orthosis)  BADL retraining, functional transfers, activity tolerance   Mobility   min A rolling, +2 supine to/from sit, min to mod A +2 SB transfer, Supervision PWC mobility  min A overall for transfers, bed mobility, etc; mod I PWC mobility  transfers, sitting balance, SCI edu   Communication             Safety/Cognition/ Behavioral Observations            Pain   Pain to back raring pain 7-8/10 on pain schdeule, medicated with prn medications  remain pain < 3  QS/PRN assessment of pain with follow up   Skin   s/p Back surgery, dressing CDI, daily/prn dressing changes  continue  healing on current skin status and remain free of new breakdown  Assess skin QS and prn     Discharge Planning:  Pt to d/c to home. Pt reports that her brother from [place] will be coming to stay with her for a short period of time, and her son will visit as well. Pt reports to having PCS services in which she gets aide service with [facility] for M-W 3.5hrs and TR/F 3hrs. SW will submit CAP/DA referral.   Team Discussion: Still having some leakage in between caths. Will increase Myrbetriq. After upcoming surgery, she should not be on back precautions as she is now. Should hopefully be able to manage bowel/bladder program on her own. Stage 3 to coccyx has scab present, one small area to back incision that has open area. Not dehisced. Continues IV Abx. CBG's doing well. Complains of right and left arm pain. Does have aid service during the week. Mod/max assist bed mobility. Mod assist +1 with slide board today. Falls posteriorly. Tightness in legs due to not doing stretching program. Independent with directing care. Working on sitting balance and transfers. Patient on target to meet rehab goals: yes, min assist goals  *See Care Plan and progress notes for long and short-term goals.   Revisions to Treatment Plan:  Adjusting medications  Teaching Needs: Family education, medication/pain management, skin/wound care, safety awareness, transfer training, etc.  Current Barriers to Discharge: Decreased caregiver support, Incontinence, Neurogenic bowel and  bladder, Wound care, Lack of/limited family support, and Pending surgery  Possible Resolutions to Barriers: Family education Follow-up fusion surgery Continue to provide female assist     Medical Summary Current Status: spasms better with increase baclofen;  on neurogneic bowel program- 2 BM's- incontnent last 2 minutes- back incision- very slightly open- scab on superficial stage III;  Barriers to Discharge: Decreased family/caregiver support;Home  enviroment access/layout;Incontinence;Neurogenic Bowel & Bladder;Medical stability;Pending Surgery;Weight;Weight bearing restrictions;Wound care  Barriers to Discharge Comments: has aide- but don't know hours/day- brother to help out, but not to help out with B/B. son and brother going back to [place] after they help Possible Resolutions to [employer] Focus: SB mod A of 1 first time today- sitting balance fair; more tightness- hasn't been stretching- doing again; needs TLSO- at this time- don't know if will after 2nd sugery- d/c 11/04/21. for surgery already scheduled.   Continued Need for Acute Rehabilitation Level of Care: The patient requires daily medical management by a physician with specialized training in physical medicine and rehabilitation for the following reasons: Direction of a multidisciplinary physical rehabilitation program to maximize functional independence : Yes Medical management of patient stability for increased activity during participation in an intensive rehabilitation regime.: Yes Analysis of laboratory values and/or radiology reports with any subsequent need for medication adjustment and/or medical intervention. : Yes   I attest that I was present, lead the team conference, and concur with the assessment and plan of the team.   Dorthula Nettles G 10/20/2021, 4:00 PM

## 2021-10-20 NOTE — Progress Notes (Signed)
Occupational Therapy Session Note  Patient Details  Name: Ellen Lane MRN: [id number] Date of Birth: 06-14-1966  Today's Date: 10/20/2021 OT Individual Time: 0730-0829 OT Individual Time Calculation (min): 59 min    Short Term Goals: Week 1:  OT Short Term Goal 1 (Week 1): Pt will complete slide board transfer bed <> wheelchair with Max A of 1 in prep to participate in OOB ADL/IADL OT Short Term Goal 2 (Week 1): Pt will perform LB dress with AE PRN bed level Max A OT Short Term Goal 3 (Week 1): Pt will tolerate sitting EOM/EOB for 5 mins with no more than CGA   Skilled Therapeutic Interventions/Progress Updates:    Pt greeted at time of session bed level resting, ready for OT session and wanting to do self care routine. Discussed with pt use of toileting tongs/hygiene aide as she had used with OT over the weekend and discussed we would use during OT sessions as she does not want to use with nursing 2/2 time constraints. Pt able to direct her care throughout session for bed level bathing and dressing with Max/total A for LB bathing and dress at bed level with OT washing legs and feet for the pt and pt able to wash periarea in side lying. Discussed with the pt using hygiene aide to wash and dry buttocks but limited today 2/2 time constraints. Pt with incontinent episode of bowel, relayed to nursing as pt does not normally have this at home with bowel program. Dependent for hygiene and brief change bed level. Nursing also present at this time working throughout session to perform dressing change and hook up IV for abx. UB dress Mod A at bed level to pull down in the back and hand off to nursing staff as unable to finish ADL at this time 2/2 time constraints. Relayed to nursing how the pt prefers to leave pants at thigh level and how to don brace. Hand off to nursing staff.   Therapy Documentation Precautions:  Precautions Precautions: Fall, Back Precaution Comments: Brace donned if HOB  >30; Don brace in SUPINE with abdominal binder on first to control excess skin around trunk. Required Braces or Orthoses: Spinal Brace Spinal Brace: Thoracolumbosacral orthotic, Applied in supine position Restrictions Weight Bearing Restrictions: No     Therapy/Group: Individual Therapy  [name] 10/20/2021, 7:19 AM

## 2021-10-20 NOTE — Progress Notes (Signed)
Occupational Therapy Weekly Progress Note  Patient Details  Name: Ellen Lane MRN: [id number] Date of Birth: [birth year]  Beginning of progress report period: October 12, 2021 End of progress report period: October 20, 2021    Patient has met 2 of 3 short term goals.  Pt is making steady progress toward OT goals, performing bed mobility with Min A during ADL tasks with assist to manage LE's 2/2 back precautions to prevent twisting, bed level ADL with pt able to direct her care very well. Pt performs bed level bathing and dressing prior to donning TLSO so HOB can be elevated >30* per precautions. Slide board transfers with Mod A of 2 at this time. Planning for upcoming family education with brother and potentially other caregivers.  Patient continues to demonstrate the following deficits: muscle weakness, decreased cardiorespiratoy endurance, impaired timing and sequencing, abnormal tone, unbalanced muscle activation, and decreased motor planning, decreased motor planning, and decreased sitting balance, decreased postural control, and decreased balance strategies and therefore will continue to benefit from skilled OT intervention to enhance overall performance with BADL, iADL, and Reduce care partner burden.  Patient progressing toward long term goals..  Continue plan of care.  OT Short Term Goals Week 1:  OT Short Term Goal 1 (Week 1): Pt will complete slide board transfer bed <> wheelchair with Max A of 1 in prep to participate in OOB ADL/IADL OT Short Term Goal 1 - Progress (Week 1): Met OT Short Term Goal 2 (Week 1): Pt will perform LB dress with AE PRN bed level Max A OT Short Term Goal 2 - Progress (Week 1): Met OT Short Term Goal 3 (Week 1): Pt will tolerate sitting EOM/EOB for 5 mins with no more than CGA OT Short Term Goal 3 - Progress (Week 1): Progressing toward goal Week 2:  OT Short Term Goal 1 (Week 2): Pt will perform slide board transfers bed <> wheelchair with Mod A of 1  in prep for IADL/ADL tasks OT Short Term Goal 2 (Week 2): Pt will use toilet aide with Mod A to wash/dry buttocks OT Short Term Goal 3 (Week 2): Pt will tolerate sitting EOM/EOB for 5 mins with no more than CGA    Therapy Documentation Precautions:  Precautions Precautions: Fall, Back Precaution Comments: Brace donned if HOB >30; Don brace in SUPINE with abdominal binder on first to control excess skin around trunk. Required Braces or Orthoses: Spinal Brace Spinal Brace: Thoracolumbosacral orthotic, Applied in supine position Restrictions Weight Bearing Restrictions: No     Therapy/Group: Individual Therapy  [name] 10/20/2021, 7:19 AM

## 2021-10-20 NOTE — Progress Notes (Signed)
Occupational Therapy Session Note  Patient Details  Name: Alee Gressman MRN: [id number] Date of Birth: 25-Sep-1966  Today's Date: 10/20/2021 OT Individual Time: 1130-1155 OT Individual Time Calculation (min): 25 min    Short Term Goals: Week 1:  OT Short Term Goal 1 (Week 1): Pt will complete slide board transfer bed <> wheelchair with Max A of 1 in prep to participate in OOB ADL/IADL OT Short Term Goal 2 (Week 1): Pt will perform LB dress with AE PRN bed level Max A OT Short Term Goal 3 (Week 1): Pt will tolerate sitting EOM/EOB for 5 mins with no more than CGA  Skilled Therapeutic Interventions/Progress Updates:    Pt resting in [place] upon arrival. Initial OT intervention with focus on BUE therex with 2# bar-rowing 3x12. SB transfer to EOB with mod A. Sit>supine tot A+2. Repositioning in bed with tot A+2. Pt remained in bed with all needs within reach.  Therapy Documentation Precautions:  Precautions Precautions: Fall, Back Precaution Comments: Brace donned if HOB >30; Don brace in SUPINE with abdominal binder on first to control excess skin around trunk. Required Braces or Orthoses: Spinal Brace Spinal Brace: Thoracolumbosacral orthotic, Applied in supine position Restrictions Weight Bearing Restrictions: No   Pain: Pain Assessment Pain Scale: 0-10 Pain Score: 0-No pain Faces Pain Scale: No hurt   Therapy/Group: Individual Therapy  Leroy Libman 10/20/2021, 12:03 PM

## 2021-10-20 NOTE — Progress Notes (Incomplete)
Bowel program initiated with stim x 3, no results tolerated well and closely monitor   Patient  resting throughout the shift , medicated x 1 for c/o back pain verbalized relief from medication, Repositioned and turned Q2 hrs and I/O cath q 5 hrs per schedule. Consume po liquids well, made as comfortable as possible. Prafo boots applied

## 2021-10-20 NOTE — Progress Notes (Signed)
Bowel program completed, patient had medium sized bowel movement

## 2021-10-20 NOTE — Progress Notes (Signed)
Patient ID: [name], female   DOB: 14-Apr-1966, 56 y.o.   MRN: [id number]  SW met with pt in room to provide updates from team conference, and d/c date remains 1/18. SW provided pt with HHA list for review for preference. SW discussed with pt when will her brother arrive so he can be present for family edu if possible pending if pt will return after her procedure on 1/18. Pt to discuss with her brother.SW will follow-up about HHA preference.   [name], MSW, [place] Office: [phone] Cell: [phone] Fax: [phone]

## 2021-10-20 NOTE — Progress Notes (Signed)
PROGRESS NOTE   Subjective/Complaints:  Pt reports bladder is better, but still leaking in between caths somewhat- just a lot less. Will increase Myrbetriq-   Also we are both wondering about back precautions to cath/do bowel program after fusion.     ROS:  Pt denies SOB, abd pain, CP, N/V/C/D, and vision changes   Objective:   No results found. Recent Labs    10/19/21 0323  WBC 5.1  HGB 9.2*  HCT 28.5*  PLT 266    Recent Labs    10/19/21 0323  NA 136  K 4.2  CL 106  CO2 24  GLUCOSE 108*  BUN 17  CREATININE 0.57  CALCIUM 9.3     Intake/Output Summary (Last 24 hours) at 10/20/2021 0849 Last data filed at 10/20/2021 0700 Gross per 24 hour  Intake 1260 ml  Output 3135 ml  Net -1875 ml     Pressure Injury 09/24/21 Sacrum Stage 3 -  Full thickness tissue loss. Subcutaneous fat may be visible but bone, tendon or muscle are NOT exposed. 2 wounds noted, sacral, one superior to the other (Active)  09/24/21 1030  Location: Sacrum  Location Orientation:   Staging: Stage 3 -  Full thickness tissue loss. Subcutaneous fat may be visible but bone, tendon or muscle are NOT exposed.  Wound Description (Comments): 2 wounds noted, sacral, one superior to the other  Present on Admission: Yes    Physical Exam: Vital Signs Blood pressure 124/87, pulse 79, temperature 98.4 F (36.9 C), temperature source Oral, resp. rate 16, height '5\' 2"'  (1.575 m), weight 74.5 kg, last menstrual period 01/08/2015, SpO2 100 %.     General: awake, alert, appropriate, laying on L side in bed; doing bed bath herself; OT in room; NAD HENT: conjugate gaze; oropharynx moist CV: regular rate; no JVD Pulmonary: CTA B/L; no W/R/R- good air movement GI: soft, NT, ND, (+)BS Psychiatric: appropriate Neurological: Ox3; no spasms; atrophy of LE's.  Extremities: No clubbing, cyanosis, or edema. Pulses are 2+- at rest, muscle atrophy/no Edema  seen Skin: back incision cdi ; Coccyx- a little small scabbed over ulcer- looks unstageable, but only partial thickness- very superficial- dime sized Neuro:  Alert and oriented x 3. Normal insight and awareness. Intact Memory. Normal language and speech. Cranial nerve exam unremarkable. ~T3 sensory level without sensation below level of injury. Stocking glove sensory loss in hands. Motor 0/5 LE, 4/5 in UE with some pain inhibition, ?peripheral weakness in HI's. DTR's 1+, no resting tone Musculoskeletal: mid back tenderness  Atrophy of LE's.   Assessment/Plan: 1. Functional deficits which require 3+ hours per day of interdisciplinary therapy in a comprehensive inpatient rehab setting. Physiatrist is providing close team supervision and 24 hour management of active medical problems listed below. Physiatrist and rehab team continue to assess barriers to discharge/monitor patient progress toward functional and medical goals  Care Tool:  Bathing    Body parts bathed by patient: Right arm, Left arm, Chest, Abdomen, Face, Front perineal area   Body parts bathed by helper: Buttocks, Right upper leg, Left upper leg, Right lower leg, Left lower leg     Bathing assist Assist Level: Maximal Assistance - Patient  24 - 49%     Upper Body Dressing/Undressing Upper body dressing   What is the patient wearing?: Pull over shirt, Orthosis    Upper body assist Assist Level: Maximal Assistance - Patient 25 - 49%    Lower Body Dressing/Undressing Lower body dressing      What is the patient wearing?: Incontinence brief, Pants     Lower body assist Assist for lower body dressing: Maximal Assistance - Patient 25 - 49%     Toileting Toileting    Toileting assist Assist for toileting: Total Assistance - Patient < 25%     Transfers Chair/bed transfer  Transfers assist     Chair/bed transfer assist level: 2 Helpers     Locomotion Ambulation   Ambulation assist   Ambulation activity did  not occur: N/A          Walk 10 feet activity   Assist  Walk 10 feet activity did not occur: N/A        Walk 50 feet activity   Assist Walk 50 feet with 2 turns activity did not occur: N/A         Walk 150 feet activity   Assist Walk 150 feet activity did not occur: N/A         Walk 10 feet on uneven surface  activity   Assist Walk 10 feet on uneven surfaces activity did not occur: N/A         Wheelchair     Assist Is the patient using a wheelchair?: Yes Type of Wheelchair: Power    Wheelchair assist level: Supervision/Verbal cueing Max wheelchair distance: 150'    Wheelchair 50 feet with 2 turns activity    Assist        Assist Level: Supervision/Verbal cueing   Wheelchair 150 feet activity     Assist      Assist Level: Supervision/Verbal cueing   Blood pressure 124/87, pulse 79, temperature 98.4 F (36.9 C), temperature source Oral, resp. rate 16, height '5\' 2"'  (1.575 m), weight 74.5 kg, last menstrual period 01/08/2015, SpO2 100 %.  Medical Problem List and Plan: 1. Functional deficits secondary to thoracolumbar epidural abscess s/p T10-L3 laminotomy and decompression 09/28/21 in setting of T3 complete paraplegia             -patient may shower             -ELOS/Goals: 10-12 days, sup-min assist goals at w/c level  D/c 11/04/21 Got PRAFOs- con't CIR- PT and OT 2.  Impaired mobility: Continue Lovenox             -antiplatelet therapy: N/a 3. Pain: Continue oxycodone prn for pain. 12/29- pain controlled- con't regimen  12/30- taking ~ 1x/day the Oxy- will con't regimen 4. Mood: LCSW to follow for evaluation and support.              -antipsychotic agents:  5. Neuropsych: This patient is capable of making decisions on her own behalf. 6. 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  --Continue ensure max and Prosource for wound healing.   12/30- pt doing a good job staying off backside when not in  therapy.  1/2- will need to go home on Air mattress until Stage III healed completely.  7. Fluids/Electrolytes/Nutrition: encouraged PO.  1/2- labs look great- con't to monitor 8.  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  9. T2DM: Well controlled with A1C-6.0. Was on Ozempic PTA.  --CBG (last 3)  Recent Labs    10/19/21 1722 10/19/21 2132 10/20/21 0614  GLUCAP 149* 125* 103*   12/27- Bgs overall controlled- con't regimen   12/30- Bgs looking OK, but Ozempic being brought in, hopefully today per pt. Will add to her meds to take 1x/week.  1/3- Bgs doing well- con't regimen 10. HIV: Stable and undetectable viral load on Biktarvy 11. 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 OSA: Does not use CPAP 13. Morbid obesity s/p gastric bypass: Will resume multivitamin BID for supplement.   1/3- BMI- down to 30 14.  Stage III sacral decub: appear to me more like a stage II, now.  Photo on file is 12/8 and appears to be Stage III. What I see now looks much better.  Will continue air mattress for pressure relief measures             -- vitamin C and Zinc to promote wound healing.              --consult WOC for input. 15. Neurogenic bowel: will resume scheduled po dulcolax in AM and dulcolax supp in PM similar to what she does at home.   12/27- going well per pt- con't regimen  12/30- constipated- LBM 3 days ago- will give sorbitol at 1530- and con't bowel program; will also change senokot tabs to QAM- night time not usually as effective for SCI pt's.   1/2- LBM this AM  1/3- bowel program going well.  16. Neurogenic bladder:             I/O cath schedule 4-5 x daily  12/27- going well except overflow- see #18  12/30- doing better with caths q4 hours 17.  Spasticity  12/26- will increase Baclofen to 10 mg TID_ might need more.   12/27- will increase Baclofen if need be, on Thursday 12/29.   12/29- spasms much better controlled- con't 10 mg TID 18. Bladder spasms/urinary overflow/leakage  12/26- will order Myrbetriq instead of Oxybutynin to help with bladder spasms.   12/27- no side effects- con't regimen  12/29- will increase caths to q4 hours- if this isn't enough, then will increase Myrbetriq for overflow leakage, but her volumes also very high.   12/30- volumes ~400cc- doing much better on q4 hours- will con't.   1/3- will increase Myrbetriq to 50 mg daily-    I called [facility] and discussed if pt will be able to do her own caths and bowel program after fusion on 1/18- Dr Kathyrn Sheriff said he's double check with Dr Arnoldo Morale, but believes that she should be able to by d/c from acute hospital.   I spent a total of 36 minutes on total care- >50% coordination of care- calling [facility] and team conference.                  LOS: 10 days A FACE TO FACE EVALUATION WAS PERFORMED  Hani Patnode 10/20/2021, 8:49 AM

## 2021-10-21 ENCOUNTER — Inpatient Hospital Stay: Payer: Medicare Other | Admitting: Internal Medicine

## 2021-10-21 LAB — GLUCOSE, CAPILLARY
Glucose-Capillary: 106 mg/dL — ABNORMAL HIGH (ref 70–99)
Glucose-Capillary: 161 mg/dL — ABNORMAL HIGH (ref 70–99)
Glucose-Capillary: 135 mg/dL — ABNORMAL HIGH (ref 70–99)
Glucose-Capillary: 221 mg/dL — ABNORMAL HIGH (ref 70–99)

## 2021-10-21 NOTE — Progress Notes (Signed)
Pt feeling very nauseous and wanting to switch to a liquid diet for a few days. Pt states after her last 3 meals she has thrown up and felt nauseous.  Ellen Lane Ellen Lane

## 2021-10-21 NOTE — Progress Notes (Signed)
PROGRESS NOTE   Subjective/Complaints:  Pt reports takes senokot at night at home, so doesn't have accidents- doing better since senokot moved to night time Had bed problems last few days, but has been changed out and doing much better.   Asking about cathing/bowel program after surgery.   Bladder so far about the same- hasn't seen a change so far.   ROS:  Pt denies SOB, abd pain, CP, N/V/C/D, and vision changes    Objective:   No results found. Recent Labs    10/19/21 0323  WBC 5.1  HGB 9.2*  HCT 28.5*  PLT 266    Recent Labs    10/19/21 0323  NA 136  K 4.2  CL 106  CO2 24  GLUCOSE 108*  BUN 17  CREATININE 0.57  CALCIUM 9.3     Intake/Output Summary (Last 24 hours) at 10/21/2021 0820 Last data filed at 10/21/2021 0530 Gross per 24 hour  Intake 470 ml  Output 2600 ml  Net -2130 ml     Pressure Injury 09/24/21 Sacrum Stage 3 -  Full thickness tissue loss. Subcutaneous fat may be visible but bone, tendon or muscle are NOT exposed. 2 wounds noted, sacral, one superior to the other (Active)  09/24/21 1030  Location: Sacrum  Location Orientation:   Staging: Stage 3 -  Full thickness tissue loss. Subcutaneous fat may be visible but bone, tendon or muscle are NOT exposed.  Wound Description (Comments): 2 wounds noted, sacral, one superior to the other  Present on Admission: Yes    Physical Exam: Vital Signs Blood pressure 114/79, pulse 76, temperature 98.6 F (37 C), temperature source Oral, resp. rate 14, height '5\' 2"'  (1.575 m), weight 74.5 kg, last menstrual period 01/08/2015, SpO2 100 %.      General: awake, alert, appropriate, laying on L side- not bathing yet this AM, but staying off backside;  NAD HENT: conjugate gaze; oropharynx moist CV: regular rate; no JVD Pulmonary: CTA B/L; no W/R/R- good air movement GI: soft, NT, ND, (+)BS- normoactive Psychiatric: appropriate Neurological: Ox3;   No spasms seen- has chronic atrophy B/L LE's.  Extremities: No clubbing, cyanosis, or edema. Pulses are 2+- at rest, muscle atrophy/no Edema seen Skin: back incision cdi ; Coccyx- a little small scabbed over ulcer- looks unstageable, but only partial thickness- very superficial- dime sized Neuro:  Alert and oriented x 3. Normal insight and awareness. Intact Memory. Normal language and speech. Cranial nerve exam unremarkable. ~T3 sensory level without sensation below level of injury. Stocking glove sensory loss in hands. Motor 0/5 LE, 4/5 in UE with some pain inhibition, ?peripheral weakness in HI's. DTR's 1+, no resting tone Musculoskeletal: mid back tenderness  Atrophy of LE's.   Assessment/Plan: 1. Functional deficits which require 3+ hours per day of interdisciplinary therapy in a comprehensive inpatient rehab setting. Physiatrist is providing close team supervision and 24 hour management of active medical problems listed below. Physiatrist and rehab team continue to assess barriers to discharge/monitor patient progress toward functional and medical goals  Care Tool:  Bathing    Body parts bathed by patient: Right arm, Left arm, Chest, Abdomen, Face, Front perineal area   Body parts  bathed by helper: Buttocks, Right upper leg, Left upper leg, Right lower leg, Left lower leg     Bathing assist Assist Level: Maximal Assistance - Patient 24 - 49%     Upper Body Dressing/Undressing Upper body dressing   What is the patient wearing?: Pull over shirt, Orthosis    Upper body assist Assist Level: Maximal Assistance - Patient 25 - 49%    Lower Body Dressing/Undressing Lower body dressing      What is the patient wearing?: Incontinence brief, Pants     Lower body assist Assist for lower body dressing: Maximal Assistance - Patient 25 - 49%     Toileting Toileting    Toileting assist Assist for toileting: Total Assistance - Patient < 25%     Transfers Chair/bed  transfer  Transfers assist     Chair/bed transfer assist level: Moderate Assistance - Patient 50 - 74%     Locomotion Ambulation   Ambulation assist   Ambulation activity did not occur: N/A          Walk 10 feet activity   Assist  Walk 10 feet activity did not occur: N/A        Walk 50 feet activity   Assist Walk 50 feet with 2 turns activity did not occur: N/A         Walk 150 feet activity   Assist Walk 150 feet activity did not occur: N/A         Walk 10 feet on uneven surface  activity   Assist Walk 10 feet on uneven surfaces activity did not occur: N/A         Wheelchair     Assist Is the patient using a wheelchair?: Yes Type of Wheelchair: Manual    Wheelchair assist level: Supervision/Verbal cueing Max wheelchair distance: 150'    Wheelchair 50 feet with 2 turns activity    Assist        Assist Level: Supervision/Verbal cueing   Wheelchair 150 feet activity     Assist      Assist Level: Supervision/Verbal cueing   Blood pressure 114/79, pulse 76, temperature 98.6 F (37 C), temperature source Oral, resp. rate 14, height '5\' 2"'  (1.575 m), weight 74.5 kg, last menstrual period 01/08/2015, SpO2 100 %.  Medical Problem List and Plan: 1. Functional deficits secondary to thoracolumbar epidural abscess s/p T10-L3 laminotomy and decompression 09/28/21 in setting of T3 complete paraplegia             -patient may shower             -ELOS/Goals: 10-12 days, sup-min assist goals at w/c level  D/c 11/04/21 1/4- Con't CIR;  PT/OT 2.  Impaired mobility: Continue Lovenox             -antiplatelet therapy: N/a 3. Pain: Continue oxycodone prn for pain.  1/4- pain controlled by taking paiun meds ~ 1x/day- con't regimen 4. Mood: LCSW to follow for evaluation and support.              -antipsychotic agents:  5. Neuropsych: This patient is capable of making decisions on her own behalf. 6. Stage III on sacrum- came with wound-  Pressure relief measures.  --Continue ensure max and Prosource for wound healing.   12/30- pt doing a good job staying off backside when not in therapy.  1/2- will need to go home on Air mattress until Stage III healed completely. 1/4- since going back to acute before d/c, cannot set up  prior- but will inform acute hospital with d/c.   7. Fluids/Electrolytes/Nutrition: encouraged PO.  1/2- labs look great- con't to monitor 8.  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 .  9. T2DM: Well controlled with A1C-6.0. Was on Ozempic PTA.  --CBG (last 3)  Recent Labs    10/20/21 1659 10/20/21 2055 10/21/21 0622  GLUCAP 117* 206* 106*   12/27- Bgs overall controlled- con't regimen   12/30- Bgs looking OK, but Ozempic being brought in, hopefully today per pt. Will add to her meds to take 1x/week.  1/4- CBGs usually great- 1 value of >200 las tnight, but unusual- con't regimen 10. HIV: Stable and undetectable viral load on Biktarvy 11. 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 OSA: Does not use CPAP 13. Morbid obesity s/p gastric bypass: Will resume multivitamin BID for supplement.   1/3- BMI- down to 30 14.  Stage III sacral decub: appear to me more like a stage II, now.  Photo on file is 12/8 and appears to be Stage III. What I see now looks much better.  Will continue air mattress for pressure relief measures             -- vitamin C and Zinc to promote wound healing.              --consult WOC for input.  1/4- is healing- now has scabbed over- con't to monitor/con't air mattress 15. Neurogenic bowel: will resume scheduled po dulcolax in AM and dulcolax supp in PM similar to what she does at home.   12/27- going well per pt- con't regimen  12/30-  constipated- LBM 3 days ago- will give sorbitol at 1530- and con't bowel program; will also change senokot tabs to QAM- night time not usually as effective for SCI pt's.   1/4- changed senokot back to night time per pt request- no more accidents this Am as a result.  16. Neurogenic bladder:             I/O cath schedule 4-5 x daily  12/27- going well except overflow- see #18  12/30- doing better with caths q4 hours 17. Spasticity  12/26- will increase Baclofen to 10 mg TID_ might need more.   12/27- will increase Baclofen if need be, on Thursday 12/29.   12/29- spasms much better controlled- con't 10 mg TID 18. Bladder spasms/urinary overflow/leakage  12/26- will order Myrbetriq instead of Oxybutynin to help with bladder spasms.   12/27- no side effects- con't regimen  12/29- will increase caths to q4 hours- if this isn't enough, then will increase Myrbetriq for overflow leakage, but her volumes also very high.   12/30- volumes ~400cc- doing much better on q4 hours- will con't.   1/3- will increase Myrbetriq to 50 mg daily-   1/4- no side effects; no results yet with increase   I called [facility] and discussed if pt will be able to do her own caths and bowel program after fusion on 1/18- Dr Kathyrn Sheriff said he's double check with Dr Arnoldo Morale, but believes that she should be able to by d/c from acute hospital. 1/4- spoke with Dr Arnoldo Morale- he agrees no caths/Bowel program until surgery, however can do  after surgery.    I spent a total of 35 minutes on total care- >50% coordination of care speaking with [facility]- Dr Arnoldo Morale about plan for surgery, etc.    LOS: 11 days A FACE TO FACE EVALUATION WAS PERFORMED  Balian Schaller 10/21/2021, 8:20 AM

## 2021-10-21 NOTE — Progress Notes (Signed)
Occupational Therapy Session Note  Patient Details  Name: Ellen Lane MRN: [id number] Date of Birth: 04-06-1966  Today's Date: 10/21/2021 OT Individual Time: 1000-1057 OT Individual Time Calculation (min): 57 min    Short Term Goals: Week 2:  OT Short Term Goal 1 (Week 2): Pt will perform slide board transfers bed <> wheelchair with Mod A of 1 in prep for IADL/ADL tasks OT Short Term Goal 2 (Week 2): Pt will use toilet aide with Mod A to wash/dry buttocks OT Short Term Goal 3 (Week 2): Pt will tolerate sitting EOM/EOB for 5 mins with no more than CGA  Skilled Therapeutic Interventions/Progress Updates:    Pt resting in bed upon arrival with RN present to connect IV. Supine>sit EOB with tot A+2. Sitting balance EOB with max A+1. SB transfer to w/c with mod A+1 and +2 to stabilize SB. PWC mobility with supervision. +2 to manage IV. Pt engaged in BUE therex and sitting balance tasks tossing dodge ball and 1kg ball against rebounder 3x12 each. Pt returned to room and remained in w/c with RN attending to IV.   Therapy Documentation Precautions:  Precautions Precautions: Fall, Back Precaution Comments: Brace donned if HOB >30; Don brace in SUPINE with abdominal binder on first to control excess skin around trunk. Required Braces or Orthoses: Spinal Brace Spinal Brace: Thoracolumbosacral orthotic, Applied in supine position Restrictions Weight Bearing Restrictions: No   Pain: Pain Assessment Pain Scale: 0-10 Pain Score: 0-No pain   Therapy/Group: Individual Therapy  Leroy Libman 10/21/2021, 11:04 AM

## 2021-10-21 NOTE — Progress Notes (Signed)
Occupational Therapy Session Note  Patient Details  Name: Anaijah Augsburger MRN: [id number] Date of Birth: [birth year]  Today's Date: 10/21/2021 OT Individual Time: 6148-3073 OT Individual Time Calculation (min): 68 min    Short Term Goals: Week 1:  OT Short Term Goal 1 (Week 1): Pt will complete slide board transfer bed <> wheelchair with Max A of 1 in prep to participate in OOB ADL/IADL OT Short Term Goal 1 - Progress (Week 1): Met OT Short Term Goal 2 (Week 1): Pt will perform LB dress with AE PRN bed level Max A OT Short Term Goal 2 - Progress (Week 1): Met OT Short Term Goal 3 (Week 1): Pt will tolerate sitting EOM/EOB for 5 mins with no more than CGA OT Short Term Goal 3 - Progress (Week 1): Progressing toward goal Week 2:  OT Short Term Goal 1 (Week 2): Pt will perform slide board transfers bed <> wheelchair with Mod A of 1 in prep for IADL/ADL tasks OT Short Term Goal 2 (Week 2): Pt will use toilet aide with Mod A to wash/dry buttocks OT Short Term Goal 3 (Week 2): Pt will tolerate sitting EOM/EOB for 5 mins with no more than CGA   Skilled Therapeutic Interventions/Progress Updates:    Pt greeted at time of session semireclined in bed, agreeable to OT session, no pain reported. Focus of session on bed level ADL with pt able to direct her care. Pt performing bathing tasks bed level in partial side lying  and bed/table heights adjusted for easier access. Pt able to wash BLEs today with bringing each LE up to self with therapist assist, maintaining back precautions and going into hip/knee flexion. OT assisted with washing buttocks and BLEs for thoroughness, pt noted to have incontinent episode of BM at this time during washing. Max A for brief change, encouraging pt to manage straps as able. Donned shirt Mod/Max A at bed level to fully bring down in the back in side lying, total A for donning pants up to thigh level. Donned TLSO and binder bed level with pt rolling L/R with Min A. Pt  reclined in bed at end of session with call bell in reach all needs met. Extended time required for ADL session.   Therapy Documentation Precautions:  Precautions Precautions: Fall, Back Precaution Comments: Brace donned if HOB >30; Don brace in SUPINE with abdominal binder on first to control excess skin around trunk. Required Braces or Orthoses: Spinal Brace Spinal Brace: Thoracolumbosacral orthotic, Applied in supine position Restrictions Weight Bearing Restrictions: No     Therapy/Group: Individual Therapy  [name] 10/21/2021, 7:11 AM

## 2021-10-21 NOTE — Progress Notes (Signed)
Physical Therapy Session Note  Patient Details  Name: Ellen Lane MRN: [id number] Date of Birth: 02/21/1966  Today's Date: 10/21/2021 PT Individual Time: 1130-1200; 8676-1950 PT Individual Time Calculation (min): 30 min and 60 min  Short Term Goals: Week 2:  PT Short Term Goal 1 (Week 2): Pt will perform bed mobility with assist x 1 PT Short Term Goal 2 (Week 2): Pt will perform least restrictive transfer with assist x 1 PT Short Term Goal 3 (Week 2): Pt will recall pressure relief schedule independently  Skilled Therapeutic Interventions/Progress Updates:    Session 1: Pt received seated in PWC in room, requesting to transfer back to bed this session for I/O cath prior to next therapy session. Slide board transfer back to bed with mod A. Session focus on sitting balance EOB with min A required initially progressing to close Supervision while pt reaches outside BOS with alt UE for targets. Pt requests to lay down at end of session. Sit to supine mod A for BLE management. Assisted pt with removing TLSO for comfort. Pt left in sidelying in bed with needs in reach.  Session 2: Pt received seated in bed, agreeable to PT session. No complaints of pain. Rolling L/R with min A for placement of TLSO. Pt is min A for donning TLSO at bed level. Supine to sit with max A for BLE management and trunk elevation, HOB elevated and use of bedrail. Slide board transfer bed to w/c with assist x 2 due to difficulty balancing on air mattress. PWC to/from therapy gym at Supervision level. Remainder of session focus on slide board transfers Meagher to/from mat table initially at mod A progressing to min A. Pt continues to exhibit improved body mechanics during transfer. Pt requests to return to bed at end of session. Slide board transfer back to bed with mod A. Sit to supine mod A for BLE management. Assisted pt with doffing abdominal binder, TLSO, scrub pants, shoes, TED hose, and scrub top. Pt able to don new  hospital gown and assisted her with donning PRAFOs. Pt left in sidelying in bed with needs in reach.  Therapy Documentation Precautions:  Precautions Precautions: Fall, Back Precaution Comments: Brace donned if HOB >30; Don brace in SUPINE with abdominal binder on first to control excess skin around trunk. Required Braces or Orthoses: Spinal Brace Spinal Brace: Thoracolumbosacral orthotic, Applied in supine position Restrictions Weight Bearing Restrictions: No     Therapy/Group: Individual Therapy   Excell Seltzer, PT, DPT, CSRS  10/21/2021, 5:15 PM

## 2021-10-22 ENCOUNTER — Inpatient Hospital Stay (HOSPITAL_COMMUNITY): Payer: Medicare Other

## 2021-10-22 LAB — GLUCOSE, CAPILLARY
Glucose-Capillary: 151 mg/dL — ABNORMAL HIGH (ref 70–99)
Glucose-Capillary: 105 mg/dL — ABNORMAL HIGH (ref 70–99)
Glucose-Capillary: 160 mg/dL — ABNORMAL HIGH (ref 70–99)
Glucose-Capillary: 130 mg/dL — ABNORMAL HIGH (ref 70–99)

## 2021-10-22 NOTE — Progress Notes (Signed)
Occupational Therapy Session Note  Patient Details  Name: Ellen Lane MRN: [id number] Date of Birth: [birth year]  Today's Date: 10/22/2021 OT Individual Time: 0800-0900 OT Individual Time Calculation (min): 60 min    Short Term Goals: Week 2:  OT Short Term Goal 1 (Week 2): Pt will perform slide board transfers bed <> wheelchair with Mod A of 1 in prep for IADL/ADL tasks OT Short Term Goal 2 (Week 2): Pt will use toilet aide with Mod A to wash/dry buttocks OT Short Term Goal 3 (Week 2): Pt will tolerate sitting EOM/EOB for 5 mins with no more than CGA  Skilled Therapeutic Interventions/Progress Updates:  Pt greeted supine in bed agreeable to OT intervention. Session focus on BADL reeducation, functional mobility and decreasing overall caregiver burden.  Pt request to complete bathing and dressing from bed level, set- up assist for basin and towels with pt completing bahting in side lying with pt needing MOD A overall for bathing with pt needing assist to wash buttock only. Pt completed UB dressing with MAX A as pt needed assist to don brace and ABD binder but able to don shirt with set- up assist. MAX A for LB dressing to don pants and brief, total A to don TEDS. Pt rolling side to side for ADLs with MIN A overall.  pt left supine in bed with IV team present and all needs within reach.                   Therapy Documentation Precautions:  Precautions Precautions: Fall, Back Precaution Comments: Brace donned if HOB >30; Don brace in SUPINE with abdominal binder on first to control excess skin around trunk. Required Braces or Orthoses: Spinal Brace Spinal Brace: Thoracolumbosacral orthotic, Applied in supine position Restrictions Weight Bearing Restrictions: No  Pain: no pain reported during session     Therapy/Group: Individual Therapy  Corinne Ports [facility] 10/22/2021, 12:21 PM

## 2021-10-22 NOTE — Progress Notes (Signed)
Physical Therapy Session Note  Patient Details  Name: Ellen Lane MRN: [id number] Date of Birth: 09-27-1966  Today's Date: 10/22/2021 PT Individual Time: 1415-1531 PT Individual Time Calculation (min): 76 min   Short Term Goals: Week 1:  PT Short Term Goal 1 (Week 1): Pt will perform rolling L/R with min A consistently PT Short Term Goal 1 - Progress (Week 1): Met PT Short Term Goal 2 (Week 1): Pt will perform supine to/from sit with assist x 1 consistently PT Short Term Goal 2 - Progress (Week 1): Progressing toward goal PT Short Term Goal 3 (Week 1): Pt will perform least restrictive transfer with assist x 1 PT Short Term Goal 3 - Progress (Week 1): Progressing toward goal PT Short Term Goal 4 (Week 1): Pt will maintain static sitting balance x 5 min with min A PT Short Term Goal 4 - Progress (Week 1): Met Week 2:  PT Short Term Goal 1 (Week 2): Pt will perform bed mobility with assist x 1 PT Short Term Goal 2 (Week 2): Pt will perform least restrictive transfer with assist x 1 PT Short Term Goal 3 (Week 2): Pt will recall pressure relief schedule independently  Skilled Therapeutic Interventions/Progress Updates:    pt received in bed and agreeable to therapy. No complaint of pain. Pt requested to brush teeth, did so in sidelying with set up. Rolling with min A sidelying>supine.  Pt directed in self stretching routine with assist as necessary using leg lifter. Discussed technique for static stretching and dynamic stretching for improved independence. Pt then transitioned to EOB with max A with +2 present for safety. Pt able to sit EOB with supervision. Reaching to therapists shoulders to assess sitting balance. Pt then performed modified sit ups, VC for maintaining back precautions. Pt then directed in lateral leans x 2 bouts for trunk control and UE strength. Posterior LOB x 2 with mod A to recover. Rest of session focused on scooting up/down bed with mod A, +2 for safety. VC/TC for  anterior weight shift while maintaining back precautions. Pt expressed and demonstrated understanding before becoming fatigued. Pt then returned to bed with mod A. Doffed TLSO and binder with assist to pull from under body and donned gown. Pt remained in bed and was left with all needs in reach and alarm active.   Therapy Documentation Precautions:  Precautions Precautions: Fall, Back Precaution Comments: Brace donned if HOB >30; Don brace in SUPINE with abdominal binder on first to control excess skin around trunk. Required Braces or Orthoses: Spinal Brace Spinal Brace: Thoracolumbosacral orthotic, Applied in supine position Restrictions Weight Bearing Restrictions: No General:     Therapy/Group: Individual Therapy  Mickel Fuchs 10/22/2021, 2:30 PM

## 2021-10-22 NOTE — Progress Notes (Signed)
Occupational Therapy Session Note  Patient Details  Name: Ellen Lane MRN: [id number] Date of Birth: 07/12/1966  Today's Date: 10/23/2021 OT Individual Time: 0100-7121 OT Individual Time Calculation (min): 61 min    Short Term Goals: Week 1:  OT Short Term Goal 1 (Week 1): Pt will complete slide board transfer bed <> wheelchair with Max A of 1 in prep to participate in OOB ADL/IADL OT Short Term Goal 1 - Progress (Week 1): Met OT Short Term Goal 2 (Week 1): Pt will perform LB dress with AE PRN bed level Max A OT Short Term Goal 2 - Progress (Week 1): Met OT Short Term Goal 3 (Week 1): Pt will tolerate sitting EOM/EOB for 5 mins with no more than CGA OT Short Term Goal 3 - Progress (Week 1): Progressing toward goal  Skilled Therapeutic Interventions/Progress Updates:    Pt greeted in bed with no c/o pain. She wanted to engage in BADLs this AM. Pt was set up in sidelying position, needed Mod A overall for LEs and perihygiene, Min A for rolling Rt>Lt for back precaution adherence. During LB self care worked on LE ROM/gentle stretching. She reports that staff has assisted her with wearing PRAFOs at night to improve ankle positioning/ROM. She also reported using the toilet tongs to increase her functional independence with pericare however tongs are not long enough for her to safely meet task demands while adhering to precautions. She would benefit from OT addressing this at later session. Pt able to direct care well and assist while donning abdominal binder + TLSO. She was left in bed at close of session with all needs within reach and 4 bedrails up, props used to support her in sidelying position for pressure relief.   Therapy Documentation Precautions:  Precautions Precautions: Fall, Back Precaution Comments: Brace donned if HOB >30; Don brace in SUPINE with abdominal binder on first to control excess skin around trunk. Required Braces or Orthoses: Spinal Brace Spinal Brace:  Thoracolumbosacral orthotic, Applied in supine position Restrictions Weight Bearing Restrictions: No ADL: ADL Eating: Not assessed Grooming: Supervision/safety Upper Body Bathing: Minimal assistance Lower Body Bathing: Dependent Upper Body Dressing: Maximal assistance Lower Body Dressing: Dependent Toileting: Not assessed  Therapy/Group: Individual Therapy  Bawi Lakins A Lashay Osborne 10/23/2021, 12:57 PM

## 2021-10-22 NOTE — Progress Notes (Signed)
Corrected flow sheets pertaining to number of lumens for PICC.  DL documented and SL was placed.

## 2021-10-22 NOTE — Progress Notes (Signed)
Patient is A&O x 4 and able to make her needs known. Patient repositioned every 2 hours and PRN. Patient continues to be intermittent straight cath. Writer attempted to educate patient on continuing to straight cath self. Patient states I have straight cath myself for 22 years at home, I know how to straight cath myself. However patient declined wanting to attempt to straight cath while in hospital. Continue to encourage patient to reposition self and to attempt to strait cath self. Cal light and personal items within reach, safety maintained.

## 2021-10-22 NOTE — Progress Notes (Signed)
Occupational Therapy Session Note  Patient Details  Name: Ellen Lane MRN: [id number] Date of Birth: [birth year]  Today's Date: 10/22/2021 OT Individual Time: 5859-2924 OT Individual Time Calculation (min): 70 min    Short Term Goals: Week 2:  OT Short Term Goal 1 (Week 2): Pt will perform slide board transfers bed <> wheelchair with Mod A of 1 in prep for IADL/ADL tasks OT Short Term Goal 2 (Week 2): Pt will use toilet aide with Mod A to wash/dry buttocks OT Short Term Goal 3 (Week 2): Pt will tolerate sitting EOM/EOB for 5 mins with no more than CGA  Skilled Therapeutic Interventions/Progress Updates:    Pt resting in bed upon arrival. OT interventin with focus on bed mobility, SB tranfsers, BUE therex, PWC mobility, and safety awareness to increase independence with BADLs. Supine>sit with tot A+1. Sitting balance EOB with max A in preparation for placement of SB. SB tranfser bed>w/c with max A. PWC mobility with supervision to day room. BUE therex with table top arm bike 5 minsx2 opposite directions. Ongoing discussion about activity level at home and home exercises after back has healed. Pt engaged in tapping ball with 2# bar 3x10. Pt returned to room. SB tranfser w/c>bed with mod A. Tot A+2 for sit>supine. Tot A+2 for repositioning in bed. Pt remained in bed with all needs within reach.   Therapy Documentation Precautions:  Precautions Precautions: Fall, Back Precaution Comments: Brace donned if HOB >30; Don brace in SUPINE with abdominal binder on first to control excess skin around trunk. Required Braces or Orthoses: Spinal Brace Spinal Brace: Thoracolumbosacral orthotic, Applied in supine position Restrictions Weight Bearing Restrictions: No   Pain: Pain Assessment Pain Scale: 0-10 Pain Score: 0-No pain Faces Pain Scale: No hurt   Therapy/Group: Individual Therapy  Leroy Libman 10/22/2021, 12:09 PM

## 2021-10-22 NOTE — Progress Notes (Signed)
Patient requested to be straight cath at 1830.

## 2021-10-22 NOTE — Progress Notes (Signed)
PROGRESS NOTE   Subjective/Complaints:  Pt reports has had N/V from Tuesday-Wednesday- hasn't vomited this AM. Didn't actually tell me- was in notes.   Told me refused dulcolax suppository and Dulcolax pill last night- secondary to having BM's during day.   Also, will have to miss therapy at 10am today if scheduled- son has MS and has doctor's appt- has to do over the phone.    ROS:  Pt denies SOB, abd pain, CP, (+) N/V/C/D, and vision changes     Objective:   No results found. No results for input(s): WBC, HGB, HCT, PLT in the last 72 hours.   No results for input(s): NA, K, CL, CO2, GLUCOSE, BUN, CREATININE, CALCIUM in the last 72 hours.    Intake/Output Summary (Last 24 hours) at 10/22/2021 0957 Last data filed at 10/22/2021 0743 Gross per 24 hour  Intake 480 ml  Output 3050 ml  Net -2570 ml     Pressure Injury 09/24/21 Sacrum Stage 3 -  Full thickness tissue loss. Subcutaneous fat may be visible but bone, tendon or muscle are NOT exposed. 2 wounds noted, sacral, one superior to the other (Active)  09/24/21 1030  Location: Sacrum  Location Orientation:   Staging: Stage 3 -  Full thickness tissue loss. Subcutaneous fat may be visible but bone, tendon or muscle are NOT exposed.  Wound Description (Comments): 2 wounds noted, sacral, one superior to the other  Present on Admission: Yes    Physical Exam: Vital Signs Blood pressure 111/77, pulse 80, temperature 98.3 F (36.8 C), resp. rate 16, height '5\' 2"'  (1.575 m), weight 74.5 kg, last menstrual period 01/08/2015, SpO2 100 %.       General: awake, alert, appropriate, laying on bed supine; NAD HENT: conjugate gaze; oropharynx moist CV: regular rate; no JVD Pulmonary: CTA B/L; no W/R/R- good air movement GI: soft, NT, ND, (+)BS; hypoactive Psychiatric: appropriate; interactive Neurological:   Extremities: No clubbing, cyanosis, or edema. Pulses are 2+- at  rest, muscle atrophy/no Edema seen Skin: back incision cdi ; Coccyx- a little small scabbed over ulcer- looks unstageable, but only partial thickness- very superficial- dime sized Neuro:  Alert and oriented x 3. Normal insight and awareness. Intact Memory. Normal language and speech. Cranial nerve exam unremarkable. ~T3 sensory level without sensation below level of injury. Stocking glove sensory loss in hands. Motor 0/5 LE, 4/5 in UE with some pain inhibition, ?peripheral weakness in HI's. DTR's 1+, no resting tone Musculoskeletal: mid back tenderness  Atrophy of LE's.   Assessment/Plan: 1. Functional deficits which require 3+ hours per day of interdisciplinary therapy in a comprehensive inpatient rehab setting. Physiatrist is providing close team supervision and 24 hour management of active medical problems listed below. Physiatrist and rehab team continue to assess barriers to discharge/monitor patient progress toward functional and medical goals  Care Tool:  Bathing    Body parts bathed by patient: Right arm, Left arm, Chest, Abdomen, Face, Front perineal area, Right upper leg, Left upper leg, Right lower leg, Left lower leg   Body parts bathed by helper: Right lower leg, Left lower leg, Buttocks     Bathing assist Assist Level: Moderate Assistance - Patient  50 - 74%     Upper Body Dressing/Undressing Upper body dressing   What is the patient wearing?: Pull over shirt, Orthosis    Upper body assist Assist Level: Maximal Assistance - Patient 25 - 49%    Lower Body Dressing/Undressing Lower body dressing      What is the patient wearing?: Incontinence brief, Pants     Lower body assist Assist for lower body dressing: Maximal Assistance - Patient 25 - 49%     Toileting Toileting    Toileting assist Assist for toileting: Total Assistance - Patient < 25%     Transfers Chair/bed transfer  Transfers assist     Chair/bed transfer assist level: Moderate Assistance -  Patient 50 - 74%     Locomotion Ambulation   Ambulation assist   Ambulation activity did not occur: N/A          Walk 10 feet activity   Assist  Walk 10 feet activity did not occur: N/A        Walk 50 feet activity   Assist Walk 50 feet with 2 turns activity did not occur: N/A         Walk 150 feet activity   Assist Walk 150 feet activity did not occur: N/A         Walk 10 feet on uneven surface  activity   Assist Walk 10 feet on uneven surfaces activity did not occur: N/A         Wheelchair     Assist Is the patient using a wheelchair?: Yes Type of Wheelchair: Manual    Wheelchair assist level: Supervision/Verbal cueing Max wheelchair distance: 150'    Wheelchair 50 feet with 2 turns activity    Assist        Assist Level: Supervision/Verbal cueing   Wheelchair 150 feet activity     Assist      Assist Level: Supervision/Verbal cueing   Blood pressure 111/77, pulse 80, temperature 98.3 F (36.8 C), resp. rate 16, height '5\' 2"'  (1.575 m), weight 74.5 kg, last menstrual period 01/08/2015, SpO2 100 %.  Medical Problem List and Plan: 1. Functional deficits secondary to thoracolumbar epidural abscess s/p T10-L3 laminotomy and decompression 09/28/21 in setting of T3 complete paraplegia             -patient may shower             -ELOS/Goals: 10-12 days, sup-min assist goals at w/c level  D/c 11/04/21 1/5- Con't CIR- PT and OT- con't PRAFOs at night.  2.  Impaired mobility: Continue Lovenox             -antiplatelet therapy: N/a 3. Pain: Continue oxycodone prn for pain.  1/4- pain controlled by taking pain meds ~ 1x/day- con't regimen 4. Mood: LCSW to follow for evaluation and support.              -antipsychotic agents:  5. Neuropsych: This patient is capable of making decisions on her own behalf. 6. 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  --Continue ensure max and Prosource for wound healing.    12/30- pt doing a good job staying off backside when not in therapy.  1/2- will need to go home on Air mattress until Stage III healed completely. 1/4- since going back to acute before d/c, cannot set up prior- but will inform acute hospital with d/c.   1/5- discussed again with pt.  7. Fluids/Electrolytes/Nutrition: encouraged PO.  1/2- labs look great-  con't to monitor 8.  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 .  9. T2DM: Well controlled with A1C-6.0. Was on Ozempic PTA.  --CBG (last 3)  Recent Labs    10/21/21 1641 10/21/21 2121 10/22/21 0616  GLUCAP 135* 221* 151*   12/27- Bgs overall controlled- con't regimen   12/30- Bgs looking OK, but Ozempic being brought in, hopefully today per pt. Will add to her meds to take 1x/week.  1/5- having night time spikes in CBGs- last 2 days- if occurs again, will call DM coordinators for recs.  10. HIV: Stable and undetectable viral load on Biktarvy 11. 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 OSA: Does not use CPAP 13. Morbid obesity s/p gastric bypass: Will resume multivitamin BID for supplement.   1/3- BMI- down to 30 14.  Stage III sacral decub: appear to me more like a stage II, now.  Photo on file is 12/8 and appears to be Stage III. What I see now looks much better.  Will continue air mattress for pressure relief measures             -- vitamin C and Zinc to promote wound healing.              --consult WOC for input.  1/4- is healing- now has scabbed over- con't to monitor/con't air mattress 15. Neurogenic bowel: will resume scheduled po dulcolax in AM and dulcolax supp in PM similar to what she does at home.   12/27- going well per pt- con't regimen  12/30- constipated- LBM 3 days  ago- will give sorbitol at 1530- and con't bowel program; will also change senokot tabs to QAM- night time not usually as effective for SCI pt's.   1/5- Pt having N/V- might be constipated- will check KUB if still having nausea- will check after 11:30 due to son's doctor's appt.  16. Neurogenic bladder:             I/O cath schedule 4-5 x daily  12/27- going well except overflow- see #18  12/30- doing better with caths q4 hours 17. Spasticity  12/26- will increase Baclofen to 10 mg TID_ might need more.   12/27- will increase Baclofen if need be, on Thursday 12/29.   12/29- spasms much better controlled- con't 10 mg TID  1/5- Spasms doing much better not resolved, but doesn't impair function.  18. Bladder spasms/urinary overflow/leakage  12/26- will order Myrbetriq instead of Oxybutynin to help with bladder spasms.   12/27- no side effects- con't regimen  12/29- will increase caths to q4 hours- if this isn't enough, then will increase Myrbetriq for overflow leakage, but her volumes also very high.   12/30- volumes ~400cc- doing much better on q4 hours- will con't.   1/3- will increase Myrbetriq to 50 mg daily-   1/4- no side effects; no results yet with increase  1/5- feels like having less leakage- con't Myrbetriq   I called [facility] and discussed if pt will be able to do her own caths and bowel program after fusion on 1/18- Dr Kathyrn Sheriff said he's double check with Dr Arnoldo Morale, but believes that she should be able to by d/c from acute hospital. 1/4-  spoke with Dr Arnoldo Morale- he agrees no caths/Bowel program until surgery, however can do after surgery.     LOS: 12 days A FACE TO FACE EVALUATION WAS PERFORMED  Ellen Lane 10/22/2021, 9:57 AM

## 2021-10-23 ENCOUNTER — Other Ambulatory Visit (HOSPITAL_COMMUNITY): Payer: Self-pay

## 2021-10-23 LAB — GLUCOSE, CAPILLARY
Glucose-Capillary: 105 mg/dL — ABNORMAL HIGH (ref 70–99)
Glucose-Capillary: 104 mg/dL — ABNORMAL HIGH (ref 70–99)
Glucose-Capillary: 150 mg/dL — ABNORMAL HIGH (ref 70–99)

## 2021-10-23 MED ORDER — SORBITOL 70 % SOLN
30.0000 mL | Freq: Once | Status: AC
  Administered 2021-10-23: 30 mL via ORAL
  Filled 2021-10-23: qty 30

## 2021-10-23 MED ORDER — CHLORHEXIDINE GLUCONATE CLOTH 2 % EX PADS
6.0000 | MEDICATED_PAD | Freq: Every day | CUTANEOUS | Status: DC
  Administered 2021-10-23 – 2021-11-02 (×11): 6 via TOPICAL

## 2021-10-23 MED ORDER — BISACODYL 5 MG PO TBEC
5.0000 mg | DELAYED_RELEASE_TABLET | Freq: Every day | ORAL | Status: DC
  Administered 2021-10-23 – 2021-11-03 (×12): 5 mg via ORAL
  Filled 2021-10-23 (×12): qty 1

## 2021-10-23 MED ORDER — SENNOSIDES-DOCUSATE SODIUM 8.6-50 MG PO TABS
2.0000 | ORAL_TABLET | Freq: Every day | ORAL | Status: DC
  Administered 2021-10-23 – 2021-11-03 (×12): 2 via ORAL
  Filled 2021-10-23 (×12): qty 2

## 2021-10-23 MED ORDER — OZEMPIC (2 MG/DOSE) 8 MG/3ML ~~LOC~~ SOPN
PEN_INJECTOR | SUBCUTANEOUS | 5 refills | Status: DC
  Filled 2021-10-23: qty 3, 28d supply, fill #0

## 2021-10-23 MED ORDER — POLYETHYLENE GLYCOL 3350 17 G PO PACK
17.0000 g | PACK | Freq: Every day | ORAL | Status: DC
  Administered 2021-10-23 – 2021-11-03 (×12): 17 g via ORAL
  Filled 2021-10-23 (×12): qty 1

## 2021-10-23 NOTE — Progress Notes (Signed)
PROGRESS NOTE   Subjective/Complaints:  Pt reports got nauseated last night x1- and spit up phlegm- per KUB, is constipated.  Also thinks stomach is so small that when takes evening meds, and then feels like vomiting due to taking meds AFTER eating.   .    ROS:  Pt denies SOB, abd pain, CP, N/V/C/D, and vision changes      Objective:   DG Abd 1 View  Result Date: 10/22/2021 CLINICAL DATA:  Nausea and vomiting. EXAM: ABDOMEN - 1 VIEW COMPARISON:  08/22/2019 FINDINGS: Suboptimal study. Back brace was not removed for the study, obscuring anatomy. Negative for bowel obstruction. Gas in nondilated large and small bowel loops. Stool in the colon. IMPRESSION: Limited study due to back brace. Constipation.  Nonobstructive bowel gas pattern. Electronically Signed   By: Franchot Gallo M.D.   On: 10/22/2021 13:13   No results for input(s): WBC, HGB, HCT, PLT in the last 72 hours.   No results for input(s): NA, K, CL, CO2, GLUCOSE, BUN, CREATININE, CALCIUM in the last 72 hours.    Intake/Output Summary (Last 24 hours) at 10/23/2021 0823 Last data filed at 10/23/2021 0721 Gross per 24 hour  Intake 360 ml  Output 2100 ml  Net -1740 ml     Pressure Injury 09/24/21 Sacrum Stage 3 -  Full thickness tissue loss. Subcutaneous fat may be visible but bone, tendon or muscle are NOT exposed. 2 wounds noted, sacral, one superior to the other (Active)  09/24/21 1030  Location: Sacrum  Location Orientation:   Staging: Stage 3 -  Full thickness tissue loss. Subcutaneous fat may be visible but bone, tendon or muscle are NOT exposed.  Wound Description (Comments): 2 wounds noted, sacral, one superior to the other  Present on Admission: Yes    Physical Exam: Vital Signs Blood pressure 108/73, pulse 92, temperature 98.5 F (36.9 C), temperature source Oral, resp. rate 16, height '5\' 2"'  (1.575 m), weight 74.5 kg, last menstrual period 01/08/2015,  SpO2 100 %.        General: awake, alert, appropriate,  laying in L side in bed; NAD HENT: conjugate gaze; oropharynx moist CV: regular rate; no JVD Pulmonary: CTA B/L; no W/R/R- good air movement GI: soft, NT, ND, (+)BS Psychiatric: appropriate- flat, but interactive Neurological: Ox3; no spasms seen Extremities: No clubbing, cyanosis, or edema. Pulses are 2+- at rest, muscle atrophy/no Edema seen Skin: back incision cdi ; Coccyx- a little small scabbed over ulcer- looks unstageable, but only partial thickness- very superficial- dime sized Neuro:  Alert and oriented x 3. Normal insight and awareness. Intact Memory. Normal language and speech. Cranial nerve exam unremarkable. ~T3 sensory level without sensation below level of injury. Stocking glove sensory loss in hands. Motor 0/5 LE, 4/5 in UE with some pain inhibition, ?peripheral weakness in HI's. DTR's 1+, no resting tone Musculoskeletal: mid back tenderness  Atrophy of LE's.   Assessment/Plan: 1. Functional deficits which require 3+ hours per day of interdisciplinary therapy in a comprehensive inpatient rehab setting. Physiatrist is providing close team supervision and 24 hour management of active medical problems listed below. Physiatrist and rehab team continue to assess barriers to discharge/monitor patient progress  toward functional and medical goals  Care Tool:  Bathing    Body parts bathed by patient: Right arm, Left arm, Chest, Abdomen, Face, Front perineal area, Right upper leg, Left upper leg, Right lower leg, Left lower leg   Body parts bathed by helper: Buttocks     Bathing assist Assist Level: Moderate Assistance - Patient 50 - 74%     Upper Body Dressing/Undressing Upper body dressing   What is the patient wearing?: Pull over shirt, Orthosis    Upper body assist Assist Level: Maximal Assistance - Patient 25 - 49%    Lower Body Dressing/Undressing Lower body dressing      What is the patient wearing?:  Incontinence brief, Pants     Lower body assist Assist for lower body dressing: Maximal Assistance - Patient 25 - 49%     Toileting Toileting    Toileting assist Assist for toileting: Total Assistance - Patient < 25%     Transfers Chair/bed transfer  Transfers assist     Chair/bed transfer assist level: Moderate Assistance - Patient 50 - 74%     Locomotion Ambulation   Ambulation assist   Ambulation activity did not occur: N/A          Walk 10 feet activity   Assist  Walk 10 feet activity did not occur: N/A        Walk 50 feet activity   Assist Walk 50 feet with 2 turns activity did not occur: N/A         Walk 150 feet activity   Assist Walk 150 feet activity did not occur: N/A         Walk 10 feet on uneven surface  activity   Assist Walk 10 feet on uneven surfaces activity did not occur: N/A         Wheelchair     Assist Is the patient using a wheelchair?: Yes Type of Wheelchair: Manual    Wheelchair assist level: Supervision/Verbal cueing Max wheelchair distance: 150'    Wheelchair 50 feet with 2 turns activity    Assist        Assist Level: Supervision/Verbal cueing   Wheelchair 150 feet activity     Assist      Assist Level: Supervision/Verbal cueing   Blood pressure 108/73, pulse 92, temperature 98.5 F (36.9 C), temperature source Oral, resp. rate 16, height '5\' 2"'  (1.575 m), weight 74.5 kg, last menstrual period 01/08/2015, SpO2 100 %.  Medical Problem List and Plan: 1. Functional deficits secondary to thoracolumbar epidural abscess s/p T10-L3 laminotomy and decompression 09/28/21 in setting of T3 complete paraplegia             -patient may shower             -ELOS/Goals: 10-12 days, sup-min assist goals at w/c level  D/c 11/04/21 1/6- Con't CIR- PT and OT- PRAFOs 2.  Impaired mobility: Continue Lovenox             -antiplatelet therapy: N/a 3. Pain: Continue oxycodone prn for pain.  1/6- Pain  controlle-d rarely taking meds- con't regimen 4. Mood: LCSW to follow for evaluation and support.              -antipsychotic agents:  5. Neuropsych: This patient is capable of making decisions on her own behalf. 6. 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  --Continue ensure max and Prosource for wound healing.   12/30- pt doing a good job staying off  backside when not in therapy.  1/2- will need to go home on Air mattress until Stage III healed completely. 1/4- since going back to acute before d/c, cannot set up prior- but will inform acute hospital with d/c.   1/6- wound healing so might not need air mattress at home- however pt said MIGHT have air mattress- doesn't remember- will check with nephew.  7. Fluids/Electrolytes/Nutrition: encouraged PO.  1/2- labs look great- con't to monitor 8.  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 .  9. T2DM: Well controlled with A1C-6.0. Was on Ozempic PTA.  --CBG (last 3)  Recent Labs    10/22/21 1653 10/22/21 2137 10/23/21 0617  GLUCAP 160* 130* 105*   12/27- Bgs overall controlled- con't regimen   12/30- Bgs looking OK, but Ozempic being brought in, hopefully today per pt. Will add to her meds to take 1x/week.  1/6- Bgs better- didn't spike last night- con't regimen 10. HIV: Stable and undetectable viral load on Biktarvy 11. 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 OSA: Does not use CPAP 13. Morbid obesity s/p gastric bypass: Will resume multivitamin BID for supplement.   1/3- BMI- down to 30 14.  Stage III sacral decub: appear to me more like a stage II, now.  Photo on file is 12/8 and appears to be Stage III. What I see now looks much better.  Will continue air mattress  for pressure relief measures             -- vitamin C and Zinc to promote wound healing.              --consult WOC for input.  1/4- is healing- now has scabbed over- con't to monitor/con't air mattress 15. Neurogenic bowel: will resume scheduled po dulcolax in AM and dulcolax supp in PM similar to what she does at home.   12/27- going well per pt- con't regimen  12/30- constipated- LBM 3 days ago- will give sorbitol at 1530- and con't bowel program; will also change senokot tabs to QAM- night time not usually as effective for SCI pt's.   1/5- Pt having N/V- might be constipated- will check KUB if still having nausea- will check after 11:30 due to son's doctor's appt.   1/6- is constipated- per KUB- likely cause of nausea- will give Sorbitol after therapy today and monitor- will also change dinner meds to BEFORE dinner.  16. Neurogenic bladder:             I/O cath schedule 4-5 x daily  12/27- going well except overflow- see #18  12/30- doing better with caths q4 hours 17. Spasticity  12/26- will increase Baclofen to 10 mg TID_ might need more.   12/27- will increase Baclofen if need be, on Thursday 12/29.   12/29- spasms much better controlled- con't 10 mg TID  1/5- Spasms doing much better not resolved, but doesn't impair function.  18. Bladder spasms/urinary overflow/leakage  12/26- will order Myrbetriq instead of Oxybutynin to help with bladder spasms.   12/27- no side effects- con't regimen  12/29- will increase caths to q4 hours- if this isn't enough, then will increase Myrbetriq for overflow leakage, but her volumes also very  high.   12/30- volumes ~400cc- doing much better on q4 hours- will con't.   1/3- will increase Myrbetriq to 50 mg daily-   1/4- no side effects; no results yet with increase  1/5- feels like having less leakage- con't Myrbetriq  FYI-  I called [facility] and discussed if pt will be able to do her own caths and bowel program after fusion on 1/18- Dr Kathyrn Sheriff said  he's double check with Dr Arnoldo Morale, but believes that she should be able to by d/c from acute hospital. 1/4- spoke with Dr Arnoldo Morale- he agrees no caths/Bowel program until surgery, however can do after surgery.     LOS: 13 days A FACE TO FACE EVALUATION WAS PERFORMED  Twyla Dais 10/23/2021, 8:23 AM

## 2021-10-23 NOTE — Progress Notes (Addendum)
Patient ID: Ellen Lane, female   DOB: [birth year], 56 y.o.   MRN: [id number]  SW spoke with Tina/[facility] Care Administrator [phone]) to inform on about pt stay with upcoming procedure, and unsure on d/c. Reports pt current aide is still available, and if pt requires more hours, there is a form that will need to be completed by physician to increase hours. SW informed will follow-up once I have an answer on when she has a d/c date if she returns back to CIR after her procedure.   *SW submitted CAP/DA referral with [facility]. Expect packet within 10 days. A priority referral will be submitted to pt PCP.   [name], MSW, [place] Office: [phone] Cell: [phone] Fax: [phone]

## 2021-10-23 NOTE — Progress Notes (Signed)
Physical Therapy Session Note  Patient Details  Name: Ellen Lane MRN: [id number] Date of Birth: [birth year]  Today's Date: 10/23/2021 PT Individual Time: 1050-1200 PT Individual Time Calculation (min): 70 min   Short Term Goals: Week 2:  PT Short Term Goal 1 (Week 2): Pt will perform bed mobility with assist x 1 PT Short Term Goal 2 (Week 2): Pt will perform least restrictive transfer with assist x 1 PT Short Term Goal 3 (Week 2): Pt will recall pressure relief schedule independently  Skilled Therapeutic Interventions/Progress Updates: Pt presented in bed agreeable to therapy. Pt denies pain during session. PTA donned shoes total A and pt performed rolling L/R with minA to allow PTA to pull pants over hips as nsg recently completed with cath. Performed supine to sit with maxA and SB set up with total A. Pt performed Slide board transfer to [facility] with maxA x 1 (+2 present for safety). Pt only requiring x 2 scoots to sit safely in chair. Pt then transported self to day room and performed Slide board transfer to high/low mat in same manner as prior. Pt then participated in seated balance activities with use of 1lb dowel. Pt performed bicep curls, chest press within small range, and shoulder ER/IR, all performed x 10 and with minA. Pt encouraged to stop during activity if using enough momentum to shift body or if pt felt like she was leaning too far in one direction or another to correct back to neutral. Once activity completed pt transferred back to [facility] in same manner continuing to only require x 2 scoots to transfer. From [place] pt participated in reaching activity placing clothespins on basketball net with PTA placing pt in neutral position (vs slightly leaning back in chair). Pt able to perform with overall minA for support. Pt then transported back to room and performed Slide board transfer to bed with maxA x 1 (near [place]) and +2 present for safety. Pt required maxA for sit to supine and for Slide  board removal. PTA then removed shoes, had pt roll L/R to lower pants over hips on preparation for cath and pt remained in R sidelying position. Pt left with call bell within reach and current needs met.      Therapy Documentation Precautions:  Precautions Precautions: Fall, Back Precaution Comments: Brace donned if HOB >30; Don brace in SUPINE with abdominal binder on first to control excess skin around trunk. Required Braces or Orthoses: Spinal Brace Spinal Brace: Thoracolumbosacral orthotic, Applied in supine position Restrictions Weight Bearing Restrictions: No General:   Vital Signs: Therapy Vitals Temp: 98.1 F (36.7 C) Temp Source: Oral Pulse Rate: 90 Resp: 17 BP: 127/78 Patient Position (if appropriate): Lying Oxygen Therapy SpO2: 100 % O2 Device: Room Air Pain:   Mobility:   Locomotion :    Trunk/Postural Assessment :    Balance:   Exercises:   Other Treatments:      Therapy/Group: Individual Therapy  Amaurie Schreckengost 10/23/2021, 4:25 PM

## 2021-10-23 NOTE — Progress Notes (Signed)
Occupational Therapy Session Note  Patient Details  Name: Ellen Lane MRN: [id number] Date of Birth: Jul 11, 1966  Today's Date: 10/23/2021 OT Individual Time: 1401-1510 OT Individual Time Calculation (min): 69 min    Short Term Goals: Week 2:  OT Short Term Goal 1 (Week 2): Pt will perform slide board transfers bed <> wheelchair with Mod A of 1 in prep for IADL/ADL tasks OT Short Term Goal 2 (Week 2): Pt will use toilet aide with Mod A to wash/dry buttocks OT Short Term Goal 3 (Week 2): Pt will tolerate sitting EOM/EOB for 5 mins with no more than CGA  Skilled Therapeutic Interventions/Progress Updates:    Pt received supine in the bed with no c/o pain. She requested to begin with BLE PROM. OT completed hip flexion, knee flexion and extension, and ankle flexion/ext with no resistance. She was able to provide great caregiver instructions re LE management and preparation for transfer. Supine to sidelying with min A, sidelying to sit with max A. Max A to remain seated. Pt completed a slideboard transfer to the power w/c with mod A. She was able to navigate w/c to the therapy gym without assist while OT managed IV pole. Pt completed another slideboard transfer to the therapy mat with mod A. Pt completed several functional reaching activity to challenge sitting balance. Added overhead reaching to challenge pec opening. She transferred back to the w/c and then to the bed. Assisted her in doffing clothes and donning new gown. She was left supine with all needs met.   Therapy Documentation Precautions:  Precautions Precautions: Fall, Back Precaution Comments: Brace donned if HOB >30; Don brace in SUPINE with abdominal binder on first to control excess skin around trunk. Required Braces or Orthoses: Spinal Brace Spinal Brace: Thoracolumbosacral orthotic, Applied in supine position Restrictions Weight Bearing Restrictions: No   Therapy/Group: Individual Therapy  Curtis Sites 10/23/2021,  6:27 AM

## 2021-10-23 NOTE — Plan of Care (Signed)
°  Problem: Consults Goal: RH GENERAL PATIENT EDUCATION Description: See Patient Education module for education specifics. Outcome: Progressing Goal: Skin Care Protocol Initiated - if Braden Score 18 or less Description: If consults are not indicated, leave blank or document N/A Outcome: Progressing Goal: Diabetes Guidelines if Diabetic/Glucose > 140 Description: If diabetic or lab glucose is > 140 mg/dl - Initiate Diabetes/Hyperglycemia Guidelines & Document Interventions  Outcome: Progressing   Problem: RH BOWEL ELIMINATION Goal: RH STG MANAGE BOWEL WITH ASSISTANCE Description: STG Manage Bowel with Min Assistance. Outcome: Progressing Goal: RH STG MANAGE BOWEL W/MEDICATION W/ASSISTANCE Description: STG Manage Bowel with Medication with [place]. Outcome: Progressing   Problem: RH BLADDER ELIMINATION Goal: RH STG MANAGE BLADDER WITH ASSISTANCE Description: STG Manage Bladder With Min Assistance Outcome: Progressing Goal: RH STG MANAGE BLADDER WITH MEDICATION WITH ASSISTANCE Description: STG Manage Bladder With Medication With [place]. Outcome: Progressing   Problem: RH SKIN INTEGRITY Goal: RH STG SKIN FREE OF INFECTION/BREAKDOWN Description: Skin will remain intact with no additional breakdown or infection while on rehab with min assist. Outcome: Progressing Goal: RH STG MAINTAIN SKIN INTEGRITY WITH ASSISTANCE Description: STG Maintain Skin Integrity With [place]. Outcome: Progressing Goal: RH STG ABLE TO PERFORM INCISION/WOUND CARE W/ASSISTANCE Description: STG Able To Perform Incision/Wound Care With [employer]. Outcome: Progressing   Problem: RH SAFETY Goal: RH STG ADHERE TO SAFETY PRECAUTIONS W/ASSISTANCE/DEVICE Description: STG Adhere to Safety Precautions With Cues and Reminders. Outcome: Progressing   Problem: RH PAIN MANAGEMENT Goal: RH STG PAIN MANAGED AT OR BELOW PT'S PAIN GOAL Description: < 4 on a 0-10 pain scale. Outcome:  Progressing   Problem: RH KNOWLEDGE DEFICIT GENERAL Goal: RH STG INCREASE KNOWLEDGE OF SELF CARE AFTER HOSPITALIZATION Description: Patient will demonstrate knowledge of medication/pain management, skin/wound care, and bowel/bladder management with educational materials and handouts provided by staff independently at discharge. Outcome: Progressing

## 2021-10-24 LAB — GLUCOSE, CAPILLARY
Glucose-Capillary: 116 mg/dL — ABNORMAL HIGH (ref 70–99)
Glucose-Capillary: 123 mg/dL — ABNORMAL HIGH (ref 70–99)
Glucose-Capillary: 158 mg/dL — ABNORMAL HIGH (ref 70–99)
Glucose-Capillary: 238 mg/dL — ABNORMAL HIGH (ref 70–99)

## 2021-10-24 MED ORDER — SORBITOL 70 % SOLN
30.0000 mL | Freq: Once | Status: AC
  Administered 2021-10-24: 30 mL via ORAL
  Filled 2021-10-24: qty 30

## 2021-10-24 MED ORDER — GABAPENTIN 250 MG/5ML PO SOLN
400.0000 mg | Freq: Three times a day (TID) | ORAL | Status: DC
  Administered 2021-10-24 – 2021-11-03 (×32): 400 mg via ORAL
  Filled 2021-10-24 (×34): qty 8

## 2021-10-24 NOTE — Plan of Care (Signed)
°  Problem: Consults Goal: RH GENERAL PATIENT EDUCATION Description: See Patient Education module for education specifics. Outcome: Progressing Goal: Skin Care Protocol Initiated - if Braden Score 18 or less Description: If consults are not indicated, leave blank or document N/A Outcome: Progressing Goal: Diabetes Guidelines if Diabetic/Glucose > 140 Description: If diabetic or lab glucose is > 140 mg/dl - Initiate Diabetes/Hyperglycemia Guidelines & Document Interventions  Outcome: Progressing   Problem: RH BOWEL ELIMINATION Goal: RH STG MANAGE BOWEL WITH ASSISTANCE Description: STG Manage Bowel with Min Assistance. Outcome: Progressing Goal: RH STG MANAGE BOWEL W/MEDICATION W/ASSISTANCE Description: STG Manage Bowel with Medication with [place]. Outcome: Progressing   Problem: RH BLADDER ELIMINATION Goal: RH STG MANAGE BLADDER WITH ASSISTANCE Description: STG Manage Bladder With Min Assistance Outcome: Progressing Goal: RH STG MANAGE BLADDER WITH MEDICATION WITH ASSISTANCE Description: STG Manage Bladder With Medication With [place]. Outcome: Progressing   Problem: RH SKIN INTEGRITY Goal: RH STG SKIN FREE OF INFECTION/BREAKDOWN Description: Skin will remain intact with no additional breakdown or infection while on rehab with min assist. Outcome: Progressing Goal: RH STG MAINTAIN SKIN INTEGRITY WITH ASSISTANCE Description: STG Maintain Skin Integrity With [place]. Outcome: Progressing Goal: RH STG ABLE TO PERFORM INCISION/WOUND CARE W/ASSISTANCE Description: STG Able To Perform Incision/Wound Care With [employer]. Outcome: Progressing   Problem: RH SAFETY Goal: RH STG ADHERE TO SAFETY PRECAUTIONS W/ASSISTANCE/DEVICE Description: STG Adhere to Safety Precautions With Cues and Reminders. Outcome: Progressing   Problem: RH PAIN MANAGEMENT Goal: RH STG PAIN MANAGED AT OR BELOW PT'S PAIN GOAL Description: < 4 on a 0-10 pain scale. Outcome:  Progressing   Problem: RH KNOWLEDGE DEFICIT GENERAL Goal: RH STG INCREASE KNOWLEDGE OF SELF CARE AFTER HOSPITALIZATION Description: Patient will demonstrate knowledge of medication/pain management, skin/wound care, and bowel/bladder management with educational materials and handouts provided by staff independently at discharge. Outcome: Progressing

## 2021-10-24 NOTE — Progress Notes (Signed)
Occupational Therapy Session Note  Patient Details  Name: Ellen Lane MRN: [id number] Date of Birth: 10-Dec-1965  Today's Date: 10/25/2021 OT Individual Time: 0750-0850 OT Individual Time Calculation (min): 60 min   Skilled Therapeutic Interventions/Progress Updates:    Pt greeted in bed with no c/o pain, agreeable to engage in BADLs this AM. Pt was set up in sidelying positions with HOB below 30 degrees per precautions. OT assisted with LB self care including LE ROM/stretching, continued education regarding directing caregivers to assist with LE ROM during ADL routine post d/c for contracture prevention. OT fashioned built up handles to increase pts independence with perihygiene using toilet tongs. Min A for thoroughness after using AE and pt doing well to adhere to her back precautions while using. Min A for rolling Rt>Lt during UB self care. +2 for boosting up in bed. Pt remained comfortably in bed at close of session, all needs within reach and 4 bedrails up. Tx focus placed on adaptive self care skills, activity tolerance, and pt education.   Therapy Documentation Precautions:  Precautions Precautions: Fall, Back Precaution Comments: Brace donned if HOB >30; Don brace in SUPINE with abdominal binder on first to control excess skin around trunk. Required Braces or Orthoses: Spinal Brace Spinal Brace: Thoracolumbosacral orthotic, Applied in supine position Restrictions Weight Bearing Restrictions: No ADL: ADL Eating: Not assessed Grooming: Supervision/safety Upper Body Bathing: Minimal assistance Lower Body Bathing: Dependent Upper Body Dressing: Maximal assistance Lower Body Dressing: Dependent Toileting: Not assessed Therapy/Group: Individual Therapy  Enma Maeda A Lynette Noah 10/25/2021, 1:04 PM

## 2021-10-24 NOTE — Progress Notes (Signed)
PROGRESS NOTE   Subjective/Complaints:  Pt reports small BM with sorbitol- wants more sorbitol, but not till after therapy.  Her Ozempic was expired so mother will bring in when can get it here- hopefully today/tomorrow.    .    ROS:  Pt denies SOB, abd pain, CP, N/V/(+)C/D, and vision changes    Objective:   No results found. No results for input(s): WBC, HGB, HCT, PLT in the last 72 hours.   No results for input(s): NA, K, CL, CO2, GLUCOSE, BUN, CREATININE, CALCIUM in the last 72 hours.    Intake/Output Summary (Last 24 hours) at 10/24/2021 1432 Last data filed at 10/24/2021 0903 Gross per 24 hour  Intake 490 ml  Output 600 ml  Net -110 ml     Pressure Injury 09/24/21 Sacrum Stage 3 -  Full thickness tissue loss. Subcutaneous fat may be visible but bone, tendon or muscle are NOT exposed. 2 wounds noted, sacral, one superior to the other (Active)  09/24/21 1030  Location: Sacrum  Location Orientation:   Staging: Stage 3 -  Full thickness tissue loss. Subcutaneous fat may be visible but bone, tendon or muscle are NOT exposed.  Wound Description (Comments): 2 wounds noted, sacral, one superior to the other  Present on Admission: Yes    Physical Exam: Vital Signs Blood pressure 107/74, pulse (!) 109, temperature 98.4 F (36.9 C), resp. rate 17, height '5\' 2"'  (1.575 m), weight 74.5 kg, last menstrual period 01/08/2015, SpO2 100 %.         General: awake, alert, appropriate, laying on R side in bed; off wound; air mattress; NAD HENT: conjugate gaze; oropharynx moist CV: regular/mildly tachycardic rate; no JVD Pulmonary: CTA B/L; no W/R/R- good air movement GI: soft, NT, ND, (+)BS Psychiatric: appropriate Neurological: Ox3 No spasms seen Extremities: No clubbing, cyanosis, or edema. Pulses are 2+- at rest, muscle atrophy/no Edema seen Skin: back incision cdi ; Coccyx- a little small scabbed over ulcer-  looks unstageable, but only partial thickness- very superficial- dime sized Neuro:  Alert and oriented x 3. Normal insight and awareness. Intact Memory. Normal language and speech. Cranial nerve exam unremarkable. ~T3 sensory level without sensation below level of injury. Stocking glove sensory loss in hands. Motor 0/5 LE, 4/5 in UE with some pain inhibition, ?peripheral weakness in HI's. DTR's 1+, no resting tone Musculoskeletal: mid back tenderness  Atrophy of LE's.   Assessment/Plan: 1. Functional deficits which require 3+ hours per day of interdisciplinary therapy in a comprehensive inpatient rehab setting. Physiatrist is providing close team supervision and 24 hour management of active medical problems listed below. Physiatrist and rehab team continue to assess barriers to discharge/monitor patient progress toward functional and medical goals  Care Tool:  Bathing    Body parts bathed by patient: Right arm, Left arm, Chest, Abdomen, Face, Front perineal area, Right upper leg, Left upper leg, Right lower leg, Left lower leg   Body parts bathed by helper: Buttocks     Bathing assist Assist Level: Moderate Assistance - Patient 50 - 74%     Upper Body Dressing/Undressing Upper body dressing   What is the patient wearing?: Pull over shirt, Orthosis  Upper body assist Assist Level: Maximal Assistance - Patient 25 - 49%    Lower Body Dressing/Undressing Lower body dressing      What is the patient wearing?: Incontinence brief, Pants     Lower body assist Assist for lower body dressing: Maximal Assistance - Patient 25 - 49%     Toileting Toileting    Toileting assist Assist for toileting: Total Assistance - Patient < 25%     Transfers Chair/bed transfer  Transfers assist     Chair/bed transfer assist level: Moderate Assistance - Patient 50 - 74%     Locomotion Ambulation   Ambulation assist   Ambulation activity did not occur: N/A          Walk 10 feet  activity   Assist  Walk 10 feet activity did not occur: N/A        Walk 50 feet activity   Assist Walk 50 feet with 2 turns activity did not occur: N/A         Walk 150 feet activity   Assist Walk 150 feet activity did not occur: N/A         Walk 10 feet on uneven surface  activity   Assist Walk 10 feet on uneven surfaces activity did not occur: N/A         Wheelchair     Assist Is the patient using a wheelchair?: Yes Type of Wheelchair: Manual    Wheelchair assist level: Supervision/Verbal cueing Max wheelchair distance: 150'    Wheelchair 50 feet with 2 turns activity    Assist        Assist Level: Supervision/Verbal cueing   Wheelchair 150 feet activity     Assist      Assist Level: Supervision/Verbal cueing   Blood pressure 107/74, pulse (!) 109, temperature 98.4 F (36.9 C), resp. rate 17, height '5\' 2"'  (1.575 m), weight 74.5 kg, last menstrual period 01/08/2015, SpO2 100 %.  Medical Problem List and Plan: 1. Functional deficits secondary to thoracolumbar epidural abscess s/p T10-L3 laminotomy and decompression 09/28/21 in setting of T3 complete paraplegia             -patient may shower             -ELOS/Goals: 10-12 days, sup-min assist goals at w/c level  D/c 11/04/21 1/7- Con't CIR- PT and OT 2.  Impaired mobility: Continue Lovenox             -antiplatelet therapy: N/a 3. Pain: Continue oxycodone prn for pain.  1/6- Pain controlle-d rarely taking meds- con't regimen 4. Mood: LCSW to follow for evaluation and support.              -antipsychotic agents:  5. Neuropsych: This patient is capable of making decisions on her own behalf. 6. 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  --Continue ensure max and Prosource for wound healing.   12/30- pt doing a good job staying off backside when not in therapy.  1/2- will need to go home on Air mattress until Stage III healed completely. 1/4- since going back  to acute before d/c, cannot set up prior- but will inform acute hospital with d/c.   1/6- wound healing so might not need air mattress at home- however pt said MIGHT have air mattress- doesn't remember- will check with nephew.  7. Fluids/Electrolytes/Nutrition: encouraged PO.  1/2- labs look great- con't to monitor 8.  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 .  9. T2DM: Well controlled with A1C-6.0. Was on Ozempic PTA.  --CBG (last 3)  Recent Labs    10/23/21 1646 10/24/21 0622 10/24/21 1138  GLUCAP 150* 116* 123*   12/27- Bgs overall controlled- con't regimen  1/7- Needs to still have Ozempic brought in- con't regimen fo rnow 10. HIV: Stable and undetectable viral load on Biktarvy 11. 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 OSA: Does not use CPAP 13. Morbid obesity s/p gastric bypass: Will resume multivitamin BID for supplement.   1/3- BMI- down to 30 14.  Stage III sacral decub: appear to me more like a stage II, now.  Photo on file is 12/8 and appears to be Stage III. What I see now looks much better.  Will continue air mattress for pressure relief measures             -- vitamin C and Zinc to promote wound healing.              --consult WOC for input.  1/4- is healing- now has scabbed over- con't to monitor/con't air mattress 15. Neurogenic bowel: will resume scheduled po dulcolax in AM and dulcolax supp in PM similar to what she does at home.   12/27- going well per pt- con't regimen  12/30- constipated- LBM 3 days ago- will give sorbitol at 1530- and con't bowel program; will also change senokot tabs to QAM- night time not usually as effective for SCI pt's.   1/5- Pt having N/V- might be constipated- will check KUB if still having  nausea- will check after 11:30 due to son's doctor's appt.   1/6- is constipated- per KUB- likely cause of nausea- will give Sorbitol after therapy today and monitor- will also change dinner meds to BEFORE dinner.   1/7- will give another dose of Sorbitol today after 3pm since only small BM yesterdya- less nausea.  16. Neurogenic bladder:             I/O cath schedule 4-5 x daily  12/27- going well except overflow- see #18  12/30- doing better with caths q4 hours 17. Spasticity  12/26- will increase Baclofen to 10 mg TID_ might need more.   12/27- will increase Baclofen if need be, on Thursday 12/29.   12/29- spasms much better controlled- con't 10 mg TID  1/5- Spasms doing much better not resolved, but doesn't impair function.  18. Bladder spasms/urinary overflow/leakage  12/26- will order Myrbetriq instead of Oxybutynin to help with bladder spasms.   12/27- no side effects- con't regimen  12/29- will increase caths to q4 hours- if this isn't enough, then will increase Myrbetriq for overflow leakage, but her volumes also very high.   12/30- volumes ~400cc- doing much better on q4 hours- will con't.   1/3- will increase Myrbetriq to 50 mg daily-   1/4- no side effects; no results yet with increase  1/5- feels like having less leakage- con't Myrbetriq  FYI-  I called [facility] and discussed if pt will be able to do her own caths and bowel program after fusion on 1/18- Dr Kathyrn Sheriff said he's double check with Dr Arnoldo Morale, but believes that she should be able to by d/c from acute hospital.  1/4- spoke with Dr Arnoldo Morale- he agrees no caths/Bowel program until surgery, however can do after surgery.     LOS: 14 days A FACE TO FACE EVALUATION WAS PERFORMED  Dorell Gatlin 10/24/2021, 2:32 PM

## 2021-10-25 LAB — GLUCOSE, CAPILLARY
Glucose-Capillary: 160 mg/dL — ABNORMAL HIGH (ref 70–99)
Glucose-Capillary: 104 mg/dL — ABNORMAL HIGH (ref 70–99)
Glucose-Capillary: 150 mg/dL — ABNORMAL HIGH (ref 70–99)
Glucose-Capillary: 176 mg/dL — ABNORMAL HIGH (ref 70–99)

## 2021-10-25 NOTE — Progress Notes (Signed)
Physical Therapy Session Note  Patient Details  Name: Ellen Lane MRN: [id number] Date of Birth: [birth year]  Today's Date: 10/25/2021 PT Individual Time: 1000-1100; 1145-1200 PT Individual Time Calculation (min): 60 min and 15 min  Short Term Goals: Week 2:  PT Short Term Goal 1 (Week 2): Pt will perform bed mobility with assist x 1 PT Short Term Goal 2 (Week 2): Pt will perform least restrictive transfer with assist x 1 PT Short Term Goal 3 (Week 2): Pt will recall pressure relief schedule independently  Skilled Therapeutic Interventions/Progress Updates:    Session 1: Pt received in R sidelying in bed, agreeable to PT session. No complaints of pain. Pt already wearing TLSO and abdominal binder from previous OT session, just needs to pull pants up over hips. Rolling L/R with min A to pull pants up over hips. Supine to sit with max A for BLE management and trunk elevation. Slide board transfer from air mattress hospital bed to [facility] with max A with +2 for safety. Once in [facility] pt is at Supervision level for controls and adjusting positioning in chair. PWC mobility to/from therapy gym at mod I level. Slide board transfer [place] to/from mat table initially with mod A progressing to min A along level surface with cues for head/hips relationship. Seated balance and UB strengthening performing 3# weighted dowel rod volleyball, 3 x 15 reps to fatigue. Seated L/R lateral leans 2 x 5 reps with min A for trunk control and balance. Pt requests to remain seated in PWC in room at end of session, needs in reach. Reviewed pressure relief schedule with patient (every 20 min x 2 min) with good recall.  Session 2: Pt received seated in PWC in room, requesting to transfer back to bed. No complaints of pain. Slide board transfer w/c to air mattress with mod A. Sit to supine mod A needed for BLE management. Rolling L/R with min A for LE management to remove TLSO, abdominal binder, pants, shirt, shoes, and TEDs.  Assisted pt with donning new,clean gown and non-skid socks. Pt left in R sidelying in bed with needs in reach at end of session.  Therapy Documentation Precautions:  Precautions Precautions: Fall, Back Precaution Comments: Brace donned if HOB >30; Don brace in SUPINE with abdominal binder on first to control excess skin around trunk. Required Braces or Orthoses: Spinal Brace Spinal Brace: Thoracolumbosacral orthotic, Applied in supine position Restrictions Weight Bearing Restrictions: No    Therapy/Group: Individual Therapy   Excell Seltzer, PT, DPT, CSRS  10/25/2021, 12:19 PM

## 2021-10-25 NOTE — Plan of Care (Signed)
°  Problem: Consults Goal: RH GENERAL PATIENT EDUCATION Description: See Patient Education module for education specifics. Outcome: Progressing Goal: Skin Care Protocol Initiated - if Braden Score 18 or less Description: If consults are not indicated, leave blank or document N/A Outcome: Progressing Goal: Diabetes Guidelines if Diabetic/Glucose > 140 Description: If diabetic or lab glucose is > 140 mg/dl - Initiate Diabetes/Hyperglycemia Guidelines & Document Interventions  Outcome: Progressing   Problem: RH BOWEL ELIMINATION Goal: RH STG MANAGE BOWEL WITH ASSISTANCE Description: STG Manage Bowel with Min Assistance. Outcome: Progressing Goal: RH STG MANAGE BOWEL W/MEDICATION W/ASSISTANCE Description: STG Manage Bowel with Medication with [place]. Outcome: Progressing   Problem: RH BLADDER ELIMINATION Goal: RH STG MANAGE BLADDER WITH ASSISTANCE Description: STG Manage Bladder With Min Assistance Outcome: Progressing Goal: RH STG MANAGE BLADDER WITH MEDICATION WITH ASSISTANCE Description: STG Manage Bladder With Medication With [place]. Outcome: Progressing   Problem: RH SKIN INTEGRITY Goal: RH STG SKIN FREE OF INFECTION/BREAKDOWN Description: Skin will remain intact with no additional breakdown or infection while on rehab with min assist. Outcome: Progressing Goal: RH STG MAINTAIN SKIN INTEGRITY WITH ASSISTANCE Description: STG Maintain Skin Integrity With [place]. Outcome: Progressing Goal: RH STG ABLE TO PERFORM INCISION/WOUND CARE W/ASSISTANCE Description: STG Able To Perform Incision/Wound Care With [employer]. Outcome: Progressing   Problem: RH SAFETY Goal: RH STG ADHERE TO SAFETY PRECAUTIONS W/ASSISTANCE/DEVICE Description: STG Adhere to Safety Precautions With Cues and Reminders. Outcome: Progressing   Problem: RH PAIN MANAGEMENT Goal: RH STG PAIN MANAGED AT OR BELOW PT'S PAIN GOAL Description: < 4 on a 0-10 pain scale. Outcome:  Progressing   Problem: RH KNOWLEDGE DEFICIT GENERAL Goal: RH STG INCREASE KNOWLEDGE OF SELF CARE AFTER HOSPITALIZATION Description: Patient will demonstrate knowledge of medication/pain management, skin/wound care, and bowel/bladder management with educational materials and handouts provided by staff independently at discharge. Outcome: Progressing

## 2021-10-26 LAB — CK: Total CK: 242 U/L — ABNORMAL HIGH (ref 38–234)

## 2021-10-26 LAB — CBC WITH DIFFERENTIAL/PLATELET
Abs Immature Granulocytes: 0.02 10*3/uL (ref 0.00–0.07)
Basophils Absolute: 0 10*3/uL (ref 0.0–0.1)
Basophils Relative: 1 %
Eosinophils Absolute: 0.2 10*3/uL (ref 0.0–0.5)
Eosinophils Relative: 4 %
HCT: 28 % — ABNORMAL LOW (ref 36.0–46.0)
Hemoglobin: 9 g/dL — ABNORMAL LOW (ref 12.0–15.0)
Immature Granulocytes: 0 %
Lymphocytes Relative: 36 %
Lymphs Abs: 1.8 10*3/uL (ref 0.7–4.0)
MCH: 26.9 pg (ref 26.0–34.0)
MCHC: 32.1 g/dL (ref 30.0–36.0)
MCV: 83.8 fL (ref 80.0–100.0)
Monocytes Absolute: 0.5 10*3/uL (ref 0.1–1.0)
Monocytes Relative: 10 %
Neutro Abs: 2.5 10*3/uL (ref 1.7–7.7)
Neutrophils Relative %: 49 %
Platelets: 229 10*3/uL (ref 150–400)
RBC: 3.34 MIL/uL — ABNORMAL LOW (ref 3.87–5.11)
RDW: 15.8 % — ABNORMAL HIGH (ref 11.5–15.5)
WBC: 5.1 10*3/uL (ref 4.0–10.5)
nRBC: 0 % (ref 0.0–0.2)

## 2021-10-26 LAB — GLUCOSE, CAPILLARY
Glucose-Capillary: 186 mg/dL — ABNORMAL HIGH (ref 70–99)
Glucose-Capillary: 108 mg/dL — ABNORMAL HIGH (ref 70–99)
Glucose-Capillary: 109 mg/dL — ABNORMAL HIGH (ref 70–99)
Glucose-Capillary: 182 mg/dL — ABNORMAL HIGH (ref 70–99)

## 2021-10-26 LAB — COMPREHENSIVE METABOLIC PANEL
ALT: 25 U/L (ref 0–44)
AST: 23 U/L (ref 15–41)
Albumin: 2.4 g/dL — ABNORMAL LOW (ref 3.5–5.0)
Alkaline Phosphatase: 143 U/L — ABNORMAL HIGH (ref 38–126)
Anion gap: 10 (ref 5–15)
BUN: 26 mg/dL — ABNORMAL HIGH (ref 6–20)
CO2: 25 mmol/L (ref 22–32)
Calcium: 9.6 mg/dL (ref 8.9–10.3)
Chloride: 101 mmol/L (ref 98–111)
Creatinine, Ser: 0.51 mg/dL (ref 0.44–1.00)
GFR, Estimated: >60 mL/min (ref >60)
Glucose, Bld: 137 mg/dL — ABNORMAL HIGH (ref 70–99)
Potassium: 4.2 mmol/L (ref 3.5–5.1)
Sodium: 136 mmol/L (ref 135–145)
Total Bilirubin: 0.3 mg/dL (ref 0.3–1.2)
Total Protein: 6.3 g/dL — ABNORMAL LOW (ref 6.5–8.1)

## 2021-10-26 LAB — C-REACTIVE PROTEIN: CRP: <0.5 mg/dL (ref <1.0)

## 2021-10-26 LAB — SEDIMENTATION RATE: Sed Rate: 75 mm/hr — ABNORMAL HIGH (ref 0–22)

## 2021-10-26 NOTE — Plan of Care (Signed)
°  Problem: RH Balance Goal: LTG Patient will maintain dynamic sitting balance (PT) Description: LTG:  Patient will maintain dynamic sitting balance with assistance during mobility activities (PT) Flowsheets (Taken 10/26/2021 0757) LTG: Pt will maintain dynamic sitting balance during mobility activities with:: (upgrade due to progress) Supervision/Verbal cueing Note: Upgrade due to progress   Problem: RH Bed Mobility Goal: LTG Patient will perform bed mobility with assist (PT) Description: LTG: Patient will perform bed mobility with assistance, with/without cues (PT). Flowsheets (Taken 10/26/2021 0757) LTG: Pt will perform bed mobility with assistance level of: (downgrade due to back precautions) Minimal Assistance - Patient > 75% Note: Downgrade due to back precautions

## 2021-10-26 NOTE — Progress Notes (Signed)
Physical Therapy Weekly Progress Note  Patient Details  Name: Ellen Lane MRN: [id number] Date of Birth: 30-Nov-1965  Beginning of progress report period: October 19, 2021 End of progress report period: October 26, 2021  Today's Date: 10/26/2021 PT Individual Time: 1000-1100; 1545-1700 PT Individual Time Calculation (min): 60 min and 75 min  Patient has met 3 of 3 short term goals.  Pt is making good progress towards therapy goals overall. She remains at mod to max A for bed mobility mostly for LE management due to back precautions. She has progressed with slide board transfers and consistently requires assist x 1 and ranges from min A to max A depending on pt fatigue level and surface height/stability. Pt is at mod I level for PWC mobility.  Patient continues to demonstrate the following deficits muscle weakness, muscle joint tightness, and muscle paralysis, decreased cardiorespiratoy endurance, abnormal tone and unbalanced muscle activation, and decreased sitting balance, decreased postural control, and decreased balance strategies and therefore will continue to benefit from skilled PT intervention to increase functional independence with mobility.  Patient progressing toward long term goals..  Plan of care revisions: upgraded sitting balance goal from min A to Supervision due to progress, downgraded bed mobility goal from mod I to min A due to ongoing back precautions.  PT Short Term Goals Week 2:  PT Short Term Goal 1 (Week 2): Pt will perform bed mobility with assist x 1 PT Short Term Goal 1 - Progress (Week 2): Met PT Short Term Goal 2 (Week 2): Pt will perform least restrictive transfer with assist x 1 PT Short Term Goal 2 - Progress (Week 2): Met PT Short Term Goal 3 (Week 2): Pt will recall pressure relief schedule independently PT Short Term Goal 3 - Progress (Week 2): Met Week 3:  PT Short Term Goal 1 (Week 3): =LTG due to ELOS  Skilled Therapeutic Interventions/Progress  Updates:    Session 1: Pt received supine in bed, agreeable to PT session. No complaints of pain. Rolling L/R with min A in order to pull pants up over hips. Supine to sit from flat bed with use of bedrail with max A due to assist needed for BLE management and trunk elevation. Slide board transfer to [facility] with mod A. PWC mobility and use of chair features for repositioning at mod I level. Slide board transfer to/from therapy mat at min to mod A level. Session focus on sitting balance/endurance. Seated ball pass around body R and L x 10 reps each direction with CGA to close Supervision for sitting balance. Seated ball toss 3 x 15 reps with CGA overall for balance. Pt requests to return to bed at end of session. Slide board transfer back to bed with min A. Sit to supine mod A needed for BLE management. Pt left in R sidelying in bed with needs in reach at end of session.  Session 2: Pt received seated in bed, agreeable to PT session. Supine to sit with max A for BLE management and trunk elevation. Slide board transfer to [facility] with mod A. PWC mobility at mod I level during session. Slide board transfer [place] to/from mat table at min to mod A level. Seated EOB to long-sitting on mat table with assist x 2 for LE management and trunk control. While in long-sitting position for balance and B HS stretch pt able to perform ball toss 4 x 15 reps to fatigue with semi-reclined rest break in between reps. Long-sitting to short-sitting EOM with assist  x 2. Seated balance EOM performing "zoom" ball while reaching outside BOS with BUE. Pt exhibits fair ability to maintain balance in this position with dynamic activity. Pt transferred back to [facility] then back to bed with mod A. Sit to supine mod A for BLE management. Assisted pt with doffing pants, shirt, abdominal binder, TLSO, TEDs, and shoes and donning new hospital gown at bed level. Pt left in L sidelying in bed with needs in reach at end of session.  Therapy  Documentation Precautions:  Precautions Precautions: Fall, Back Precaution Comments: Brace donned if HOB >30; Don brace in SUPINE with abdominal binder on first to control excess skin around trunk. Required Braces or Orthoses: Spinal Brace Spinal Brace: Thoracolumbosacral orthotic, Applied in supine position Restrictions Weight Bearing Restrictions: No     Therapy/Group: Individual Therapy   Excell Seltzer, PT, DPT, CSRS 10/26/2021, 11:55 AM

## 2021-10-26 NOTE — Progress Notes (Signed)
Occupational Therapy Session Note  Patient Details  Name: Ellen Lane MRN: [id number] Date of Birth: Nov 19, 1965  Today's Date: 10/26/2021 OT Individual Time: 9741-6384 OT Individual Time Calculation (min): 69 min    Short Term Goals: Week 1:  OT Short Term Goal 1 (Week 1): Pt will complete slide board transfer bed <> wheelchair with Max A of 1 in prep to participate in OOB ADL/IADL OT Short Term Goal 1 - Progress (Week 1): Met OT Short Term Goal 2 (Week 1): Pt will perform LB dress with AE PRN bed level Max A OT Short Term Goal 2 - Progress (Week 1): Met OT Short Term Goal 3 (Week 1): Pt will tolerate sitting EOM/EOB for 5 mins with no more than CGA OT Short Term Goal 3 - Progress (Week 1): Progressing toward goal Week 2:  OT Short Term Goal 1 (Week 2): Pt will perform slide board transfers bed <> wheelchair with Mod A of 1 in prep for IADL/ADL tasks OT Short Term Goal 2 (Week 2): Pt will use toilet aide with Mod A to wash/dry buttocks OT Short Term Goal 3 (Week 2): Pt will tolerate sitting EOM/EOB for 5 mins with no more than CGA   Skilled Therapeutic Interventions/Progress Updates:    Pt greeted at time of session semireclined bed level, no pain, agreeable to OT session. Note pt did have 1 episode of incontinent BM during session. Focused on BADL retraining with pt able to direct her care, OT assisting with managing BLEs as needed to maintain precautions throughout but pt able to wash UB/LB with LHS and compensatory techniques as well as modified toileting aide. Extended time for all tasks to manage multiple rolls, bathing/rinsing, and managing AE. Pt performs most of the task but does need assist to bring LE's up to her to maintain precautions. UB dress Mod A for shirt and therapist assist for binder and TLSO, pt able to roll to assist and in supine able to bring brace parts around anteriorly to fasten. LB dressing dependent for TEDS and total A for pants up to thigh level as they  will be cathing soon. Hand off to nursing for IV hook up at end of session.   Therapy Documentation Precautions:  Precautions Precautions: Fall, Back Precaution Comments: Brace donned if HOB >30; Don brace in SUPINE with abdominal binder on first to control excess skin around trunk. Required Braces or Orthoses: Spinal Brace Spinal Brace: Thoracolumbosacral orthotic, Applied in supine position Restrictions Weight Bearing Restrictions: No    Therapy/Group: Individual Therapy  [name] 10/26/2021, 7:14 AM

## 2021-10-26 NOTE — Progress Notes (Signed)
PROGRESS NOTE   Subjective/Complaints:  Labs reviewed , hgb low but stable vs prior values ROS:  Pt denies SOB, abd pain, CP, N/V/(+)C/D, and vision changes    Objective:   No results found. Recent Labs    10/26/21 0003  WBC 5.1  HGB 9.0*  HCT 28.0*  PLT 229     Recent Labs    10/26/21 0003  NA 136  K 4.2  CL 101  CO2 25  GLUCOSE 137*  BUN 26*  CREATININE 0.51  CALCIUM 9.6      Intake/Output Summary (Last 24 hours) at 10/26/2021 7124 Last data filed at 10/26/2021 0824 Gross per 24 hour  Intake 1280 ml  Output 2550 ml  Net -1270 ml      Pressure Injury 09/24/21 Sacrum Stage 3 -  Full thickness tissue loss. Subcutaneous fat may be visible but bone, tendon or muscle are NOT exposed. 2 wounds noted, sacral, one superior to the other (Active)  09/24/21 1030  Location: Sacrum  Location Orientation:   Staging: Stage 3 -  Full thickness tissue loss. Subcutaneous fat may be visible but bone, tendon or muscle are NOT exposed.  Wound Description (Comments): 2 wounds noted, sacral, one superior to the other  Present on Admission: Yes    Physical Exam: Vital Signs Blood pressure 131/85, pulse 79, temperature 98.7 F (37.1 C), temperature source Oral, resp. rate 16, height _0  (1.575 m), weight 74.5 kg, last menstrual period 01/08/2015, SpO2 100 %.  General: No acute distress Mood and affect are appropriate Heart: Regular rate and rhythm no rubs murmurs or extra sounds Lungs: Clear to auscultation, breathing unlabored, no rales or wheezes Abdomen: Positive bowel sounds, soft nontender to palpation, nondistended    Extremities: No clubbing, cyanosis, or edema. Pulses are 2+- at rest, muscle atrophy/no Edema seen Skin: back incision cdi ; Coccyx- a little small scabbed over ulcer- looks unstageable, but only partial thickness- very superficial- dime sized Neuro:  Alert and oriented x 3. Normal insight and  awareness. Intact Memory. Normal language and speech. Cranial nerve exam unremarkable. ~T3 sensory level without sensation below level of injury. Stocking glove sensory loss in hands. Motor 0/5 LE, 4/5 in UE with some pain inhibition, ?peripheral weakness in HI's. DTR's 1+, no resting tone Musculoskeletal: mid back tenderness  Atrophy of LE's.   Assessment/Plan: 1. Functional deficits which require 3+ hours per day of interdisciplinary therapy in a comprehensive inpatient rehab setting. Physiatrist is providing close team supervision and 24 hour management of active medical problems listed below. Physiatrist and rehab team continue to assess barriers to discharge/monitor patient progress toward functional and medical goals  Care Tool:  Bathing    Body parts bathed by patient: Right arm, Left arm, Chest, Abdomen, Face, Front perineal area, Right upper leg, Left upper leg, Buttocks   Body parts bathed by helper: Right lower leg, Left lower leg     Bathing assist Assist Level: Moderate Assistance - Patient 50 - 74%     Upper Body Dressing/Undressing Upper body dressing   What is the patient wearing?: Pull over shirt, Orthosis    Upper body assist Assist Level: Maximal Assistance - Patient 25 -  49%    Lower Body Dressing/Undressing Lower body dressing      What is the patient wearing?: Incontinence brief, Pants     Lower body assist Assist for lower body dressing: Maximal Assistance - Patient 25 - 49%     Toileting Toileting    Toileting assist Assist for toileting: Total Assistance - Patient < 25%     Transfers Chair/bed transfer  Transfers assist     Chair/bed transfer assist level: Maximal Assistance - Patient 25 - 49%     Locomotion Ambulation   Ambulation assist   Ambulation activity did not occur: N/A          Walk 10 feet activity   Assist  Walk 10 feet activity did not occur: N/A        Walk 50 feet activity   Assist Walk 50 feet with 2  turns activity did not occur: N/A         Walk 150 feet activity   Assist Walk 150 feet activity did not occur: N/A         Walk 10 feet on uneven surface  activity   Assist Walk 10 feet on uneven surfaces activity did not occur: N/A         Wheelchair     Assist Is the patient using a wheelchair?: Yes Type of Wheelchair: Manual    Wheelchair assist level: Independent Max wheelchair distance: 150'    Wheelchair 50 feet with 2 turns activity    Assist        Assist Level: Independent   Wheelchair 150 feet activity     Assist      Assist Level: Independent   Blood pressure 131/85, pulse 79, temperature 98.7 F (37.1 C), temperature source Oral, resp. rate 16, height _0  (1.575 m), weight 74.5 kg, last menstrual period 01/08/2015, SpO2 100 %.  Medical Problem List and Plan: 1. Functional deficits secondary to thoracolumbar epidural abscess s/p T10-L3 laminotomy and decompression 09/28/21 in setting of T3 complete paraplegia             -patient may shower             -ELOS/Goals: 10-12 days, sup-min assist goals at w/c level  D/c 11/04/21- awaiting second stage thoracic spine surgery  1/7- Con't CIR- PT and OT 2.  Impaired mobility: Continue Lovenox             -antiplatelet therapy: N/a 3. Pain: Continue oxycodone prn for pain.  1/6- Pain controlle-d rarely taking meds- con't regimen 4. Mood: LCSW to follow for evaluation and support.              -antipsychotic agents:  5. Neuropsych: This patient is capable of making decisions on her own behalf. 6. 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  --Continue ensure max and Prosource for wound healing.   12/30- pt doing a good job staying off backside when not in therapy.  1/2- will need to go home on Air mattress until Stage III healed completely. 1/4- since going back to acute before d/c, cannot set up prior- but will inform acute hospital with d/c.   1/6- wound healing  so might not need air mattress at home- however pt said MIGHT have air mattress- doesn't remember- will check with nephew.  7. Fluids/Electrolytes/Nutrition: encouraged PO.  1/2- labs look great- con't to monitor 8.  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 .  9. T2DM: Well controlled with A1C-6.0. Was on Ozempic PTA.  --CBG (last 3)  Recent Labs    10/25/21 1638 10/25/21 2053 10/26/21 0534  GLUCAP 150* 176* 186*   Fair control 1/9 1/7- Needs to still have Ozempic brought in- con't regimen fo rnow 10. HIV: Stable and undetectable viral load on Biktarvy 11. 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 OSA: Does not use CPAP 13. Morbid obesity s/p gastric bypass: Will resume multivitamin BID for supplement.   1/3- BMI- down to 30 14.  Stage III sacral decub: appear to me more like a stage II, now.  Photo on file is 12/8 and appears to be Stage III. What I see now looks much better.  Will continue air mattress for pressure relief measures             -- vitamin C and Zinc to promote wound healing.              --consult WOC for input.  1/4- is healing- now has scabbed over- con't to monitor/con't air mattress 15. Neurogenic bowel: will resume scheduled po dulcolax in AM and dulcolax supp in PM similar to what she does at home.   Freq BMs, 4x yesterday , likely looser related to abx, also used less pain meds yesterday, will monitor prior to changes  16. Neurogenic bladder:             I/O cath schedule 4-5 x daily  12/27- going well except overflow- see #18  12/30- doing better with caths q4 hours 17. Spasticity  12/26- will increase Baclofen to 10 mg TID_ might need more.   12/27- will increase Baclofen if need be, on Thursday 12/29.    12/29- spasms much better controlled- con't 10 mg TID  1/5- Spasms doing much better not resolved, but doesn't impair function.  18. Bladder spasms/urinary overflow/leakage  12/26- will order Myrbetriq instead of Oxybutynin to help with bladder spasms.   12/27- no side effects- con't regimen  12/29- will increase caths to q4 hours- if this isn't enough, then will increase Myrbetriq for overflow leakage, but her volumes also very high.   12/30- volumes ~400cc- doing much better on q4 hours- will con't.   1/3- will increase Myrbetriq to 50 mg daily-   1/4- no side effects; no results yet with increase  1/5- feels like having less leakage- con't Myrbetriq   1/4- per  Dr Arnoldo Morale- he agrees no self caths/Bowel program until surgery, however can do after surgery.     LOS: 16 days A FACE TO FACE EVALUATION WAS PERFORMED  Charlett Blake 10/26/2021, 9:22 AM

## 2021-10-26 NOTE — Progress Notes (Addendum)
Bowel program initiated this shift, patient tolerated well. Oncoming nurse aware. No BM prior to departure.   Yehuda Mao, LPN

## 2021-10-27 ENCOUNTER — Other Ambulatory Visit: Payer: Self-pay | Admitting: Neurosurgery

## 2021-10-27 LAB — GLUCOSE, CAPILLARY
Glucose-Capillary: 94 mg/dL (ref 70–99)
Glucose-Capillary: 97 mg/dL (ref 70–99)
Glucose-Capillary: 149 mg/dL — ABNORMAL HIGH (ref 70–99)
Glucose-Capillary: 188 mg/dL — ABNORMAL HIGH (ref 70–99)

## 2021-10-27 NOTE — Progress Notes (Signed)
Physical Therapy Session Note  Patient Details  Name: Ellen Lane MRN: [id number] Date of Birth: December 02, 1965  Today's Date: 10/27/2021 PT Individual Time: 1453-1550 PT Individual Time Calculation (min): 57 min   Short Term Goals: Week 3:  PT Short Term Goal 1 (Week 3): =LTG due to ELOS  Skilled Therapeutic Interventions/Progress Updates:    Pt received supine in bed, agreeable to PT session. No complaints of pain. Assisted pt with donning pants at bed level dependently with min A for rolling L/R. Supine to sit with max A for BLE management and trunk elevation. Slide board transfer to [facility] with mod A. Pt is at mod I level for PWC mobility. Pt reports she had elevated BP overnight and nursing provided some education regarding AD but she wanted further education. Provided handout and discussed signs/symptoms of AD, causes of AD, and treatment. Pt appreciative of education. Slide board transfer [place] to/from mat table with min to mod A during session. Pt returned to bed at end of session, mod A for SB transfer. Sit to supine mod A for BLE management. Pt found to be incontinent of BM, assisted with pericare and brief change. Pt left in L sidelying in bed with needs in reach at end of session.  Therapy Documentation Precautions:  Precautions Precautions: Fall, Back Precaution Comments: Brace donned if HOB >30; Don brace in SUPINE with abdominal binder on first to control excess skin around trunk. Required Braces or Orthoses: Spinal Brace Spinal Brace: Thoracolumbosacral orthotic, Applied in supine position Restrictions Weight Bearing Restrictions: No     Therapy/Group: Individual Therapy   Excell Seltzer, PT, DPT, CSRS  10/27/2021, 5:15 PM

## 2021-10-27 NOTE — Progress Notes (Signed)
PROGRESS NOTE   Subjective/Complaints: The patient has had straight cath this morning she states she can feel when she is full although she does not have normal sensation. Labs reviewed , hgb low but stable vs prior values ROS:  Pt denies SOB, abd pain, CP, N/V/(+)C/D, and vision changes    Objective:   No results found. Recent Labs    10/26/21 0003  WBC 5.1  HGB 9.0*  HCT 28.0*  PLT 229      Recent Labs    10/26/21 0003  NA 136  K 4.2  CL 101  CO2 25  GLUCOSE 137*  BUN 26*  CREATININE 0.51  CALCIUM 9.6       Intake/Output Summary (Last 24 hours) at 10/27/2021 0931 Last data filed at 10/27/2021 0816 Gross per 24 hour  Intake 4707.65 ml  Output 3750 ml  Net 957.65 ml      Pressure Injury 09/24/21 Sacrum Stage 3 -  Full thickness tissue loss. Subcutaneous fat may be visible but bone, tendon or muscle are NOT exposed. 2 wounds noted, sacral, one superior to the other (Active)  09/24/21 1030  Location: Sacrum  Location Orientation:   Staging: Stage 3 -  Full thickness tissue loss. Subcutaneous fat may be visible but bone, tendon or muscle are NOT exposed.  Wound Description (Comments): 2 wounds noted, sacral, one superior to the other  Present on Admission: Yes    Physical Exam: Vital Signs Blood pressure 106/65, pulse 74, temperature 97.8 F (36.6 C), resp. rate 16, height '5\' 2"'  (1.575 m), weight 74.5 kg, last menstrual period 01/08/2015, SpO2 100 %.  General: No acute distress Mood and affect are appropriate Heart: Regular rate and rhythm no rubs murmurs or extra sounds Lungs: Clear to auscultation, breathing unlabored, no rales or wheezes Abdomen: Positive bowel sounds, soft nontender to palpation, nondistended  Extremities: No clubbing, cyanosis, or edema. Pulses are 2+- at rest, muscle atrophy/no Edema seen Skin: back incision cdi ; Coccyx- a little small scabbed over ulcer- looks  unstageable, but only partial thickness- very superficial- dime sized Neuro:  Alert and oriented x 3. Normal insight and awareness. Intact Memory. Normal language and speech. Cranial nerve exam unremarkable. ~T3 sensory level without sensation below level of injury. Stocking glove sensory loss in hands. Motor 0/5 LE, 4/5 in UE with some pain inhibition, ?peripheral weakness in HI's. DTR's 1+, no resting tone Musculoskeletal: mid back tenderness  Atrophy of LE's.   Assessment/Plan: 1. Functional deficits which require 3+ hours per day of interdisciplinary therapy in a comprehensive inpatient rehab setting. Physiatrist is providing close team supervision and 24 hour management of active medical problems listed below. Physiatrist and rehab team continue to assess barriers to discharge/monitor patient progress toward functional and medical goals  Care Tool:  Bathing    Body parts bathed by patient: Right arm, Left arm, Chest, Abdomen, Face, Front perineal area, Right upper leg, Left upper leg, Buttocks   Body parts bathed by helper: Right lower leg, Left lower leg     Bathing assist Assist Level: Moderate Assistance - Patient 50 - 74%     Upper Body Dressing/Undressing Upper body dressing   What is  the patient wearing?: Pull over shirt, Orthosis    Upper body assist Assist Level: Maximal Assistance - Patient 25 - 49%    Lower Body Dressing/Undressing Lower body dressing      What is the patient wearing?: Incontinence brief, Pants     Lower body assist Assist for lower body dressing: Maximal Assistance - Patient 25 - 49%     Toileting Toileting    Toileting assist Assist for toileting: Total Assistance - Patient < 25%     Transfers Chair/bed transfer  Transfers assist     Chair/bed transfer assist level: Moderate Assistance - Patient 50 - 74%     Locomotion Ambulation   Ambulation assist   Ambulation activity did not occur: N/A          Walk 10 feet  activity   Assist  Walk 10 feet activity did not occur: N/A        Walk 50 feet activity   Assist Walk 50 feet with 2 turns activity did not occur: N/A         Walk 150 feet activity   Assist Walk 150 feet activity did not occur: N/A         Walk 10 feet on uneven surface  activity   Assist Walk 10 feet on uneven surfaces activity did not occur: N/A         Wheelchair     Assist Is the patient using a wheelchair?: Yes Type of Wheelchair: Manual    Wheelchair assist level: Independent Max wheelchair distance: 150'    Wheelchair 50 feet with 2 turns activity    Assist        Assist Level: Independent   Wheelchair 150 feet activity     Assist      Assist Level: Independent   Blood pressure 106/65, pulse 74, temperature 97.8 F (36.6 C), resp. rate 16, height '5\' 2"'  (1.575 m), weight 74.5 kg, last menstrual period 01/08/2015, SpO2 100 %.  Medical Problem List and Plan: 1. Functional deficits secondary to thoracolumbar epidural abscess s/p T10-L3 laminotomy and decompression 09/28/21 in setting of T3 complete paraplegia             -patient may shower             -ELOS/Goals: 10-12 days, sup-min assist goals at w/c level  D/c 11/04/21- awaiting second stage thoracic spine surgery, with Dr. Arnoldo Morale.  Will likely be be discharged straight to neurosurgery 1/7- Con't CIR- PT and OT 2.  Impaired mobility: Continue Lovenox             -antiplatelet therapy: N/a 3. Pain: Continue oxycodone prn for pain.  1/6- Pain controlle-d rarely taking meds- con't regimen 4. Mood: LCSW to follow for evaluation and support.              -antipsychotic agents:  5. Neuropsych: This patient is capable of making decisions on her own behalf. 6. 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  --Continue ensure max and Prosource for wound healing.   12/30- pt doing a good job staying off backside when not in therapy.  1/2- will need to go home  on Air mattress until Stage III healed completely. 1/4- since going back to acute before d/c, cannot set up prior- but will inform acute hospital with d/c.   1/6- wound healing so might not need air mattress at home- however pt said MIGHT have air mattress- doesn't remember- will check with nephew.  7. Fluids/Electrolytes/Nutrition: encouraged PO.  1/2- labs look great- con't to monitor 8.  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 .  9. T2DM: Well controlled with A1C-6.0. Was on Ozempic PTA.  --CBG (last 3)  Recent Labs    10/26/21 1707 10/26/21 2044 10/27/21 0538  GLUCAP 109* 182* 94   Fair control 1/9 1/7- Needs to still have Ozempic brought in- con't regimen fo rnow 10. HIV: Stable and undetectable viral load on Biktarvy 11. 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 OSA: Does not use CPAP 13. Morbid obesity s/p gastric bypass: Will resume multivitamin BID for supplement.   1/3- BMI- down to 30 14.  Stage III sacral decub: appear to me more like a stage II, now.  Photo on file is 12/8 and appears to be Stage III. What I see now looks much better.  Will continue air mattress for pressure relief measures             -- vitamin C and Zinc to promote wound healing.              --consult WOC for input.  1/4- is healing- now has scabbed over- con't to monitor/con't air mattress 15. Neurogenic bowel: will resume scheduled po dulcolax in AM and dulcolax supp in PM similar to what she does at home.   Freq BMs, 4x yesterday , likely looser related to abx, also used less pain meds yesterday, will monitor prior to changes  16. Neurogenic bladder:             I/O cath schedule 4-5 x daily  12/27- going well except overflow- see #18  12/30- doing  better with caths q4 hours 17. Spasticity  12/26- will increase Baclofen to 10 mg TID_ might need more.   12/27- will increase Baclofen if need be, on Thursday 12/29.   12/29- spasms much better controlled- con't 10 mg TID  1/5- Spasms doing much better not resolved, but doesn't impair function.  18. Bladder spasms/urinary overflow/leakage  12/26- will order Myrbetriq instead of Oxybutynin to help with bladder spasms.   12/27- no side effects- con't regimen  12/29- will increase caths to q4 hours- if this isn't enough, then will increase Myrbetriq for overflow leakage, but her volumes also very high.   12/30- volumes ~400cc- doing much better on q4 hours- will con't.   1/3- will increase Myrbetriq to 50 mg daily-   1/4- no side effects; no results yet with increase  1/5- feels like having less leakage- con't Myrbetriq   1/4- per  Dr Arnoldo Morale- he agrees no self caths/Bowel program until surgery, however can do after surgery.     LOS: 17 days A FACE TO FACE EVALUATION WAS PERFORMED  Charlett Blake 10/27/2021, 9:31 AM

## 2021-10-27 NOTE — Progress Notes (Signed)
Occupational Therapy Session Note  Patient Details  Name: Ellen Lane MRN: [id number] Date of Birth: 22-Mar-1966  Today's Date: 10/27/2021 OT Individual Time: 5462-7035 OT Individual Time Calculation (min): 58 min    Short Term Goals: Week 1:  OT Short Term Goal 1 (Week 1): Pt will complete slide board transfer bed <> wheelchair with Max A of 1 in prep to participate in OOB ADL/IADL OT Short Term Goal 1 - Progress (Week 1): Met OT Short Term Goal 2 (Week 1): Pt will perform LB dress with AE PRN bed level Max A OT Short Term Goal 2 - Progress (Week 1): Met OT Short Term Goal 3 (Week 1): Pt will tolerate sitting EOM/EOB for 5 mins with no more than CGA OT Short Term Goal 3 - Progress (Week 1): Progressing toward goal Week 2:  OT Short Term Goal 1 (Week 2): Pt will perform slide board transfers bed <> wheelchair with Mod A of 1 in prep for IADL/ADL tasks OT Short Term Goal 2 (Week 2): Pt will use toilet aide with Mod A to wash/dry buttocks OT Short Term Goal 3 (Week 2): Pt will tolerate sitting EOM/EOB for 5 mins with no more than CGA   Skilled Therapeutic Interventions/Progress Updates:    Pt greeted at time of session supine in bed resting agreeable to OT session and no pain. Pt wanting to perform ADL routine this morning for bathing and dressing. Pt performing bed mobility with Min A only to manage LE to maintain back precautions for log rolling but otherwise able to manage with UB. Pt performing bathing at bed level with HOB <30* within precautions, set up for items needed and pt performing UB bathing with set up/supervision and LB bathing with Mod A. Therapist bringing LE up to the pt and pt able to wash each LE and periarea, used AE tongs for washing buttocks but needing assist for thoroughness. Pt performing UB dress shirt only Mod/Max A bed level with assist to pull down in the back and donned abdominal binder in same manner rolling L/R. Nursing present at this time for dressing  change so did not don brace and removed binder for access. Pt left with nursing care.   Therapy Documentation Precautions:  Precautions Precautions: Fall, Back Precaution Comments: Brace donned if HOB >30; Don brace in SUPINE with abdominal binder on first to control excess skin around trunk. Required Braces or Orthoses: Spinal Brace Spinal Brace: Thoracolumbosacral orthotic, Applied in supine position Restrictions Weight Bearing Restrictions: No    Therapy/Group: Individual Therapy  [name] 10/27/2021, 7:18 AM

## 2021-10-27 NOTE — Progress Notes (Signed)
Bowel program initiated this shift, patient tolerated well. Oncoming nurse aware. No BM prior to departure.   Yehuda Mao, LPN

## 2021-10-27 NOTE — Patient Care Conference (Signed)
Inpatient RehabilitationTeam Conference and Plan of Care Update Date: 10/27/2021   Time: 11:12 AM    Patient Name: Ellen Lane      Medical Record Number: [id number]  Date of Birth: 20-Nov-1965 Sex: Female         Room/Bed: 4W14C/4W14C-01 Payor Info: Payor: [employer] park [employer] / Plan: [employer] / Product Type: *No Product type* /    Admit Date/Time:  10/10/2021 10:44 PM  Primary Diagnosis:  Abscess of paraspinal muscles  Hospital Problems: Principal Problem:   Abscess of paraspinal muscles    Expected Discharge Date: Expected Discharge Date: 11/04/21  Team Members Present: Physician leading conference: Dr. Alger Simons Social Worker Present: [name], [place] Nurse Present: Dorthula Nettles, RN PT Present: Excell Seltzer, PT OT Present: Roanna Epley, COTA;Jennifer Tamala Julian, OT PPS Coordinator present : Gunnar Fusi, SLP     Current Status/Progress Goal Weekly Team Focus  Bowel/Bladder   I/O cath Q4hr, Bowel program. LBm 1/9  Continue cathing schedule.  Continue bowel program and I/O cath schedule.   Swallow/Nutrition/ Hydration             ADL's   bathing/dressing at bed level-max A: SB tranfsers-mod A+1; sitting balance-max A  bathing-min A (w/c level and/or bed level); LB dressing-mod A; UB dressing-supervision (excluding orthosis)  BADL retraining, SB tranfsers, activity tolerance, UB strengthening   Mobility   min A rolling, mod to max A supine to/from sit, min to max A SB transfer, mod I PWC mobility  min A overall for transfers, bed mobility, etc; mod I PWC mobility  transfers, sitting balance, bed mobility, SCI Pharmacist, community Observations            Pain   Pain to upper back. PRN medicaitons available.  remain pain < 3  Assess Qshift and prn   Skin   Back incision, daily dressing change. Pressure injury to sacrum.  continue healing on current skin status and remain free of new breakdown  Assess  Qshift and prn     Discharge Planning:  Pt to d/c to home. Pt reports that her brother from [place] will be coming to stay with her for a short period of time, and her son will visit as well. Pt reports to having PCS services in which she gets aide service with [facility] for M-W 3.5hrs and TR/F 3hrs.  CAP/DA referral submitted.   Team Discussion: Epidural abscess, continuing IV antibiotics. Discharging to surgery next week for stabilization/fusion. Will return to acute care. Neurogenic bowel/bladder. Stage 3 to sacrum continues to heal. Back incision clean and dry, mod assist for bed mobility, min assist to max assist for slide board transfers. Mod I with power WC. Requires assist due to back precautions. Needs upper extremity support.  Patient on target to meet rehab goals: yes, min assist overall.  *See Care Plan and progress notes for long and short-term goals.   Revisions to Treatment Plan:  Not at this time. Teaching Needs: Family education, medication management, skin/wound care, transfer training, etc.  Current Barriers to Discharge: Decreased caregiver support, IV antibiotics, Neurogenic bowel and bladder, Wound care, Lack of/limited family support, Weight bearing restrictions, Medication compliance, and Pending surgery  Possible Resolutions to Barriers: Family education Discharge to acute for surgery 11/04/21      Medical Summary Current Status: bowel program. stage III sacrum healing. back incision improved. pain controlled. back to OR on 11/04/21  Barriers to Discharge: Medical stability   Possible Resolutions to Barriers/Weekly Focus: daily assessment of patient labs and data, wound care   Continued Need for Acute Rehabilitation Level of Care: The patient requires daily medical management by a physician with specialized training in physical medicine and rehabilitation for the following reasons: Direction of a multidisciplinary physical rehabilitation program to maximize  functional independence : Yes Medical management of patient stability for increased activity during participation in an intensive rehabilitation regime.: Yes Analysis of laboratory values and/or radiology reports with any subsequent need for medication adjustment and/or medical intervention. : Yes   I attest that I was present, lead the team conference, and concur with the assessment and plan of the team.   Cristi Loron 10/27/2021, 4:15 PM

## 2021-10-27 NOTE — Progress Notes (Addendum)
Patient ID: Ellen Lane, female   DOB: [birth year], 56 y.o.   MRN: [id number]  SW met with pt in room to provide updates from team conference, in which she will d/c acute on 1/18 for procedure and will not return to CIR. Pt aware SW will work on arranging [facility] for IV abx and therapies, and will see about air mattress. SW encouraged to look for CAP/DA referral in mail, and will f/u with aide service to discuss above as well. Preferred [facility] is [facility]. Fam edu schedule for Monday 1/16 8am-12pm with her mother. Primary support will be  her mother and sister, since her brother recently gained employment and will not be coming for [name].  [name], MSW, [place] Office: [phone] Cell: [phone] Fax: [phone]

## 2021-10-27 NOTE — Progress Notes (Signed)
Physical Therapy Session Note  Patient Details  Name: Ellen Lane MRN: [id number] Date of Birth: [birth year]  Today's Date: 10/27/2021 PT Individual Time: 1132-1200 PT Individual Time Calculation (min): 28 min   Short Term Goals: Week 2:  PT Short Term Goals - Week 2 PT Short Term Goal 1 (Week 2): Pt will perform bed mobility with assist x 1 PT Short Term Goal 1 - Progress (Week 2): Met PT Short Term Goal 2 (Week 2): Pt will perform least restrictive transfer with assist x 1 PT Short Term Goal 2 - Progress (Week 2): Met PT Short Term Goal 3 (Week 2): Pt will recall pressure relief schedule independently PT Short Term Goal 3 - Progress (Week 2): Met PT Short Term Goals - Week 3 PT Short Term Goal 1 (Week 3): =LTG due to ELOS  Skilled Therapeutic Interventions/Progress Updates:  Patient seated upright in power chair on entrance to room. Patient alert and agreeable to PT session. COTA informed this therapist prior to session that pt would need to return to bed during session for change in positioning and pressure relief.   Patient with no pain complaint throughout session.  Discussion with pt re: regular transfer methods vs slide board use now. Pt directed session with therapist demonstrating good awareness of precautions and safety.   Therapeutic Activity: Transfers: Patient performed slide board transfer to L side from w/c to deflated air mattress with ModA. One LOB to L and one posteriorly requiring ModA to correct.  Bed Mobility: Patient performed sit--> supine with ModA for BLE. Max A for positioning toward HOB. Pt able to roll to each side and provided with MinA to maintain back precautions. Assisted with doffing of pants prior to upcoming in/out cath.   Therapeutic Exercise: Pt guided and assisted in LE stretching of gastrocsoleus complex using leg lifter for 5sec holds x10 bilaterally. Guided pt with palpation to tissue to ensure good stretch without overstretch and  potential harm. Progressed to straight leg HS stretch requiring assist for positioning of LE and leg lifter for improved knee extension position. 5sec holds x10 bilaterally.  Patient supine  in bed at end of session with brakes locked, bed alarm set, and all needs within reach. Lunch tray arriving. Pt left in care of NT.      Therapy Documentation Precautions:  Precautions Precautions: Fall, Back Precaution Comments: Brace donned if HOB >30; Don brace in SUPINE with abdominal binder on first to control excess skin around trunk. Required Braces or Orthoses: Spinal Brace Spinal Brace: Thoracolumbosacral orthotic, Applied in supine position Restrictions Weight Bearing Restrictions: No General:   Vital Signs:   Pain: Pain Assessment Pain Scale: 0-10 Pain Score: 0-No pain  Therapy/Group: Individual Therapy  Alger Simons PT, DPT 10/27/2021, 10:31 AM

## 2021-10-27 NOTE — Progress Notes (Signed)
Occupational Therapy Session Note  Patient Details  Name: Safira Proffit MRN: [id number] Date of Birth: Aug 07, 1966  Today's Date: 10/27/2021 OT Individual Time: 1000-1055 OT Individual Time Calculation (min): 55 min    Short Term Goals: Week 2:  OT Short Term Goal 1 (Week 2): Pt will perform slide board transfers bed <> wheelchair with Mod A of 1 in prep for IADL/ADL tasks OT Short Term Goal 2 (Week 2): Pt will use toilet aide with Mod A to wash/dry buttocks OT Short Term Goal 3 (Week 2): Pt will tolerate sitting EOM/EOB for 5 mins with no more than CGA  Skilled Therapeutic Interventions/Progress Updates:    Pt resting in bed upon arrival. Initial focus on donning TLSO. Pt independent in directing placement of abd binder and brace but dependent for donning brace. Supine>sit EOB with tot A. Sitting balance with mod A. SB tranfser to w/c with max A+1. PWC mobility with supervision. Theract with rebounder beach ball 12x3 and 10x3 with 1kg ball. Chest presses 8x3 with 2# bar. Pt returned to room and remained in [facility] with all needs within reach.  Therapy Documentation Precautions:  Precautions Precautions: Fall, Back Precaution Comments: Brace donned if HOB >30; Don brace in SUPINE with abdominal binder on first to control excess skin around trunk. Required Braces or Orthoses: Spinal Brace Spinal Brace: Thoracolumbosacral orthotic, Applied in supine position Restrictions Weight Bearing Restrictions: No   Pain: Pain Assessment Pain Scale: 0-10 Pain Score: 0-No pain   Therapy/Group: Individual Therapy  Leroy Libman 10/27/2021, 11:31 AM

## 2021-10-28 LAB — GLUCOSE, CAPILLARY
Glucose-Capillary: 119 mg/dL — ABNORMAL HIGH (ref 70–99)
Glucose-Capillary: 87 mg/dL (ref 70–99)
Glucose-Capillary: 134 mg/dL — ABNORMAL HIGH (ref 70–99)
Glucose-Capillary: 202 mg/dL — ABNORMAL HIGH (ref 70–99)

## 2021-10-28 MED ORDER — SEMAGLUTIDE (2 MG/DOSE) 8 MG/3ML ~~LOC~~ SOPN
2.0000 mg | PEN_INJECTOR | SUBCUTANEOUS | Status: DC
  Administered 2021-10-28: 2 mg via SUBCUTANEOUS
  Filled 2021-10-28 (×2): qty 1

## 2021-10-28 NOTE — Progress Notes (Signed)
Occupational Therapy Weekly Progress Note  Patient Details  Name: Ellen Lane MRN: [id number] Date of Birth: 06-Feb-1966  Beginning of progress report period: October 20, 2021 End of progress report period: October 28, 2021    Patient has met 1 of 3 short term goals.  Pt is slowly progressing toward LTGs as pt directs her care very well during ADL tasks and performs bathing/dressing bed level with HOB <30* per precautions before donning brace. Occassional cues during BADL to maintain back precautions and Min A to manage BLEs during rolling. Planning for upcoming family ed next week prior to pt having further surgical intervention for fusion.   Patient continues to demonstrate the following deficits: muscle weakness, decreased cardiorespiratoy endurance, impaired timing and sequencing, abnormal tone, decreased coordination, and decreased motor planning, and decreased sitting balance, decreased postural control, and decreased balance strategies and therefore will continue to benefit from skilled OT intervention to enhance overall performance with BADL and Reduce care partner burden.  Patient progressing toward long term goals..  Continue plan of care.  OT Short Term Goals Week 2:  OT Short Term Goal 1 (Week 2): Pt will perform slide board transfers bed <> wheelchair with Mod A of 1 in prep for IADL/ADL tasks OT Short Term Goal 1 - Progress (Week 2): Progressing toward goal OT Short Term Goal 2 (Week 2): Pt will use toilet aide with Mod A to wash/dry buttocks OT Short Term Goal 2 - Progress (Week 2): Met OT Short Term Goal 3 (Week 2): Pt will tolerate sitting EOM/EOB for 5 mins with no more than CGA OT Short Term Goal 3 - Progress (Week 2): Progressing toward goal Week 3:  OT Short Term Goal 1 (Week 3): STGs = LTGs 2/2 LOS   Therapy Documentation Precautions:  Precautions Precautions: Fall, Back Precaution Comments: Brace donned if HOB >30; Don brace in SUPINE with abdominal binder  on first to control excess skin around trunk. Required Braces or Orthoses: Spinal Brace Spinal Brace: Thoracolumbosacral orthotic, Applied in supine position Restrictions Weight Bearing Restrictions: No    Therapy/Group: Individual Therapy  [name] 10/28/2021, 7:14 AM

## 2021-10-28 NOTE — Progress Notes (Signed)
Physical Therapy Session Note ° °Patient Details  °Name: Ellen Lane °MRN: [id number] °Date of Birth: 06/23/1966 ° °Today's Date: 10/28/2021 °PT Individual Time: 1434-1545 °PT Individual Time Calculation (min): 71 min  ° °Short Term Goals: °Week 3:  PT Short Term Goal 1 (Week 3): =LTG due to ELOS ° °Skilled Therapeutic Interventions/Progress Updates: Pt presented in bed agreeable to therapy. Pt denies pain during session. Performed supine to sit with maxA and set up with Slide board maxA with pt requiring assist for leaning. Performed Slide board transfer with modA and pt demonstrating good push and head hips relationship but requiring assist for truncal support. Pt then independently directed PWC to day room and performed Slide board transfer to high/low mat in same manner. Pt participated in sitting balance activities including lateral ball taps with 1Kg weighted ball requiring modA, ball catch with beach ball, and static sitting balance, and bean bag toss. Pt requiring minA for all dynamic balance activities with x 1 posterior LOB noted when pt moved ball with increased momentum and legs not strapped together. Pt encouraged to attempt to reach placing arms away from trunk with pt attempting intermittently. Pt performed all these activities to fatigue Pt then performed Slide board transfer in same manner to return to PWC and drove back to room. Once in room pt performed Slide board transfer back to bed with modA and requiring modA for sit to supine. Once in bed TED hose and shoes doffed total A and pt performed rolling L/R to have pants doffed. PTA then noted pt with incontinent BM and peri-care performed total A. While performing rolling TLSO and abdominal binder removed with pt able to unstrap and initiate rolling of binder. Pt was able to doff scrub top with supervision. Once completed pt left in L sidelying with call bell within reach and needs met.  °   ° °Therapy Documentation °Precautions:   °Precautions °Precautions: Fall, Back °Precaution Comments: Brace donned if HOB >30°; Don brace in SUPINE with abdominal binder on first to control excess skin around trunk. °Required Braces or Orthoses: Spinal Brace °Spinal Brace: Thoracolumbosacral orthotic, Applied in supine position °Restrictions °Weight Bearing Restrictions: No °General: °  °Vital Signs: °Therapy Vitals °Temp: (!) 97.5 °F (36.4 °C) °Temp Source: Oral °Pulse Rate: 96 °Resp: 18 °BP: 116/73 °Patient Position (if appropriate): Lying °Oxygen Therapy °SpO2: 100 % °O2 Device: Room Air °Pain: °  °Mobility: °  °Locomotion : °   °Trunk/Postural Assessment : °   °Balance: °  °Exercises: °  °Other Treatments:   ° ° ° °Therapy/Group: Individual Therapy ° °Rosita DeChalus °10/28/2021, 4:04 PM  °

## 2021-10-28 NOTE — Progress Notes (Signed)
Occupational Therapy Session Note  Patient Details  Name: Ellen Lane MRN: [id number] Date of Birth: 06/15/1966  Today's Date: 10/28/2021 OT Individual Time: 4287-6811 OT Individual Time Calculation (min): 70 min    Short Term Goals: Week 2:  OT Short Term Goal 1 (Week 2): Pt will perform slide board transfers bed <> wheelchair with Mod A of 1 in prep for IADL/ADL tasks OT Short Term Goal 2 (Week 2): Pt will use toilet aide with Mod A to wash/dry buttocks OT Short Term Goal 3 (Week 2): Pt will tolerate sitting EOM/EOB for 5 mins with no more than CGA  Skilled Therapeutic Interventions/Progress Updates:    Pt resting in bed upon arrival. TLSO already donned. OT intervention with focus on bed mobility, sitting balance, PWC mobility, and BUE strengthening to increase pt's independence with functional transfers. Supine>sit EOB with max A+1. Sitting balance EOB with BUE support with min A. SB transfer to [place] with max A+1. PWC mobility with supervision. BUE therex: rebounder with dodge ball 12x3, rebounder with 1kg ball 10x3. Tap dodge ball with 2# bar 3x12 and 3# bar 3x10. Pt returned to room SB tranfser to bed with max A+1. Pt remained in bed with all needs within reach.   Therapy Documentation Precautions:  Precautions Precautions: Fall, Back Precaution Comments: Brace donned if HOB >30; Don brace in SUPINE with abdominal binder on first to control excess skin around trunk. Required Braces or Orthoses: Spinal Brace Spinal Brace: Thoracolumbosacral orthotic, Applied in supine position Restrictions Weight Bearing Restrictions: No   Pain: Pt denies pain this morning   Therapy/Group: Individual Therapy  Leroy Libman 10/28/2021, 12:04 PM

## 2021-10-28 NOTE — Discharge Instructions (Addendum)
Inpatient Rehab Discharge Instructions  [place] Discharge date and time: No discharge date for patient encounter.   Activities/Precautions/ Functional Status: Activity: activity as tolerated/back brace when out of bed Diet: regular diet Wound Care: Routine skin checks Functional status:  ___ No restrictions     ___ Walk up steps independently ___ 24/7 supervision/assistance   ___ Walk up steps with assistance ___ Intermittent supervision/assistance  ___ Bathe/dress independently ___ Walk with walker     __x_ Bathe/dress with assistance ___ Walk Independently    ___ Shower independently ___ Walk with assistance    ___ Shower with assistance ___ No alcohol     ___ Return to work/school ________  Special Instructions:  No driving smoking or alcohol  Intravenous Maxipime 2 g every 8 hours through 11/22/2021 and stop  My questions have been answered and I understand these instructions. I will adhere to these goals and the provided educational materials after my discharge from the hospital.  Patient/Caregiver Signature _______________________________ Date __________  Clinician Signature _______________________________________ Date __________  Please bring this form and your medication list with you to all your follow-up doctor's appointments.

## 2021-10-28 NOTE — Progress Notes (Signed)
Occupational Therapy Session Note  Patient Details  Name: Ellen Lane MRN: [id number] Date of Birth: 04-22-1966  Today's Date: 10/28/2021 OT Individual Time: 5643-3295 OT Individual Time Calculation (min): 47 min  and Today's Date: 10/28/2021 OT Missed Time: 13 Minutes Missed Time Reason: Other (comment) (pt on phone)   Short Term Goals: Week 1:  OT Short Term Goal 1 (Week 1): Pt will complete slide board transfer bed <> wheelchair with Max A of 1 in prep to participate in OOB ADL/IADL OT Short Term Goal 1 - Progress (Week 1): Met OT Short Term Goal 2 (Week 1): Pt will perform LB dress with AE PRN bed level Max A OT Short Term Goal 2 - Progress (Week 1): Met OT Short Term Goal 3 (Week 1): Pt will tolerate sitting EOM/EOB for 5 mins with no more than CGA OT Short Term Goal 3 - Progress (Week 1): Progressing toward goal Week 2:  OT Short Term Goal 1 (Week 2): Pt will perform slide board transfers bed <> wheelchair with Mod A of 1 in prep for IADL/ADL tasks OT Short Term Goal 2 (Week 2): Pt will use toilet aide with Mod A to wash/dry buttocks OT Short Term Goal 3 (Week 2): Pt will tolerate sitting EOM/EOB for 5 mins with no more than CGA   Skilled Therapeutic Interventions/Progress Updates:    Pt greeted at time of session semireclined bed level receiving a phone call at beginning of session regarding her son, pt needing a few minutes at beginning of session to finish phone call. Resumed session and pt performing morning routine of ADL tasks, pt directing her care throughout. 2/2 time constraints, OT assisting with LB bathe/dress for time conservation but pt is normally able to assist with compensatory and adaptive techniques. Bed level bathing with Set up for UB using LHS and environmental modifications. UB dress for shirt with Mod/Max bed level to fully pull down in the back and donned binder/TLSO bed level. MinA for rolling throughout only to manage LE to maintain back precautions for  log roll. Pt at bed level call bell in reach. Missed 13 mins 2/2 phone call.   Therapy Documentation Precautions:  Precautions Precautions: Fall, Back Precaution Comments: Brace donned if HOB >30; Don brace in SUPINE with abdominal binder on first to control excess skin around trunk. Required Braces or Orthoses: Spinal Brace Spinal Brace: Thoracolumbosacral orthotic, Applied in supine position Restrictions Weight Bearing Restrictions: No     Therapy/Group: Individual Therapy  [name] 10/28/2021, 7:14 AM

## 2021-10-28 NOTE — Progress Notes (Signed)
Patient ID: Ellen Lane, female   DOB: 05/29/1966, 56 y.o.   MRN: [id number]  SW order hospital bed and air mattress with [place] via parachute.   SW updated [name] on pt d/c plan. SW sent HHPT/OT/SN (wound care and Iv abx until 2/5).  SW sent HHPT/OTSN (wound care and IV abx referral to [name] and waiting on follow-up.   [name], MSW, [place] Office: [phone] Cell: [phone] Fax: [phone]

## 2021-10-28 NOTE — Progress Notes (Signed)
Pt complains of lump on right breast. Not painful anymore but is a concern of hers.   Arlon Bleier Allegra Grana

## 2021-10-28 NOTE — Progress Notes (Signed)
PROGRESS NOTE   Subjective/Complaints:  Pt states bowel and bladder program doing well.  Hand tingling pain improved on Gabapentin  ROS:  Pt denies SOB, abd pain, CP, N/V/(+)C/D, and vision changes    Objective:   No results found. Recent Labs    10/26/21 0003  WBC 5.1  HGB 9.0*  HCT 28.0*  PLT 229      Recent Labs    10/26/21 0003  NA 136  K 4.2  CL 101  CO2 25  GLUCOSE 137*  BUN 26*  CREATININE 0.51  CALCIUM 9.6       Intake/Output Summary (Last 24 hours) at 10/28/2021 1237 Last data filed at 10/28/2021 1014 Gross per 24 hour  Intake 1562 ml  Output 3750 ml  Net -2188 ml      Pressure Injury 09/24/21 Sacrum Stage 3 -  Full thickness tissue loss. Subcutaneous fat may be visible but bone, tendon or muscle are NOT exposed. 2 wounds noted, sacral, one superior to the other (Active)  09/24/21 1030  Location: Sacrum  Location Orientation:   Staging: Stage 3 -  Full thickness tissue loss. Subcutaneous fat may be visible but bone, tendon or muscle are NOT exposed.  Wound Description (Comments): 2 wounds noted, sacral, one superior to the other  Present on Admission: Yes    Physical Exam: Vital Signs Blood pressure 128/73, pulse 73, temperature 98 F (36.7 C), resp. rate 18, height _0  (1.575 m), weight 74.5 kg, last menstrual period 01/08/2015, SpO2 100 %.   General: No acute distress Mood and affect are appropriate Heart: Regular rate and rhythm no rubs murmurs or extra sounds Lungs: Clear to auscultation, breathing unlabored, no rales or wheezes Abdomen: Positive bowel sounds, soft nontender to palpation, nondistended    Extremities: No clubbing, cyanosis, or edema. Pulses are 2+- at rest, muscle atrophy/no Edema seen Skin: back incision cdi ; Coccyx- a little small scabbed over ulcer- looks unstageable, but only partial thickness- very superficial- dime sized Neuro:  Alert and oriented x  3. Normal insight and awareness. Intact Memory. Normal language and speech. Cranial nerve exam unremarkable. ~T3 sensory level without sensation below level of injury. Stocking glove sensory loss in hands. Motor 0/5 LE, 4/5 in UE with some pain inhibition, ?peripheral weakness in HI's. DTR's 1+, no resting tone Musculoskeletal: mid back tenderness  Atrophy of LE's.   Assessment/Plan: 1. Functional deficits which require 3+ hours per day of interdisciplinary therapy in a comprehensive inpatient rehab setting. Physiatrist is providing close team supervision and 24 hour management of active medical problems listed below. Physiatrist and rehab team continue to assess barriers to discharge/monitor patient progress toward functional and medical goals  Care Tool:  Bathing    Body parts bathed by patient: Right arm, Left arm, Chest, Abdomen, Face, Front perineal area, Right upper leg, Left upper leg, Buttocks   Body parts bathed by helper: Right lower leg, Left lower leg     Bathing assist Assist Level: Moderate Assistance - Patient 50 - 74%     Upper Body Dressing/Undressing Upper body dressing   What is the patient wearing?: Pull over shirt, Orthosis    Upper body assist Assist  Level: Maximal Assistance - Patient 25 - 49%    Lower Body Dressing/Undressing Lower body dressing      What is the patient wearing?: Incontinence brief, Pants     Lower body assist Assist for lower body dressing: Maximal Assistance - Patient 25 - 49%     Toileting Toileting    Toileting assist Assist for toileting: Total Assistance - Patient < 25%     Transfers Chair/bed transfer  Transfers assist     Chair/bed transfer assist level: Moderate Assistance - Patient 50 - 74%     Locomotion Ambulation   Ambulation assist   Ambulation activity did not occur: N/A          Walk 10 feet activity   Assist  Walk 10 feet activity did not occur: N/A        Walk 50 feet  activity   Assist Walk 50 feet with 2 turns activity did not occur: N/A         Walk 150 feet activity   Assist Walk 150 feet activity did not occur: N/A         Walk 10 feet on uneven surface  activity   Assist Walk 10 feet on uneven surfaces activity did not occur: N/A         Wheelchair     Assist Is the patient using a wheelchair?: Yes Type of Wheelchair: Manual    Wheelchair assist level: Independent Max wheelchair distance: 150'    Wheelchair 50 feet with 2 turns activity    Assist        Assist Level: Independent   Wheelchair 150 feet activity     Assist      Assist Level: Independent   Blood pressure 128/73, pulse 73, temperature 98 F (36.7 C), resp. rate 18, height _0  (1.575 m), weight 74.5 kg, last menstrual period 01/08/2015, SpO2 100 %.  Medical Problem List and Plan: 1. Functional deficits secondary to thoracolumbar epidural abscess s/p T10-L3 laminotomy and decompression 09/28/21 in setting of T3 complete paraplegia             -patient may shower             -ELOS/Goals: 10-12 days, sup-min assist goals at w/c level  D/c 11/04/21- awaiting second stage thoracic spine surgery, with Dr. Arnoldo Morale.  Will likely be be discharged straight to neurosurgery 1/7- Con't CIR- PT and OT 2.  Impaired mobility: Continue Lovenox             -antiplatelet therapy: N/a 3. Pain: Continue oxycodone prn for pain.  1/6- Pain controlle-d rarely taking meds- con't regimen 4. Mood: LCSW to follow for evaluation and support.              -antipsychotic agents:  5. Neuropsych: This patient is capable of making decisions on her own behalf. 6. 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  --Continue ensure max and Prosource for wound healing.   12/30- pt doing a good job staying off backside when not in therapy.  1/2- will need to go home on Air mattress until Stage III healed completely. 1/4- since going back to acute before  d/c, cannot set up prior- but will inform acute hospital with d/c.   1/6- wound healing so might not need air mattress at home- however pt said MIGHT have air mattress- doesn't remember- will check with nephew.  7. Fluids/Electrolytes/Nutrition: encouraged PO.  1/2- labs look great- con't to monitor 8.  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 .  9. T2DM: Well controlled with A1C-6.0. Was on Ozempic PTA.  --CBG (last 3)  Recent Labs    10/27/21 2115 10/28/21 0551 10/28/21 1201  GLUCAP 188* 119* 87   Fair control 1/11 1/7- Needs to still have Ozempic brought in- con't regimen fo rnow 10. HIV: Stable and undetectable viral load on Biktarvy 11. 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 OSA: Does not use CPAP 13. Morbid obesity s/p gastric bypass: Will resume multivitamin BID for supplement.   1/3- BMI- down to 30 14.  Stage III sacral decub: appear to me more like a stage II, now.  Photo on file is 12/8 and appears to be Stage III. What I see now looks much better.  Will continue air mattress for pressure relief measures             -- vitamin C and Zinc to promote wound healing.              --consult WOC for input.  1/4- is healing- now has scabbed over- con't to monitor/con't air mattress 15. Neurogenic bowel: will resume scheduled po dulcolax in AM and dulcolax supp in PM similar to what she does at home.   Freq BMs, 4x yesterday , likely looser related to abx, also used less pain meds yesterday, will monitor prior to changes  16. Neurogenic bladder:             I/O cath schedule 4-5 x daily  12/27- going well except overflow- see #18  12/30- doing better with caths q4 hours 17. Spasticity  12/26- will increase Baclofen to 10 mg TID_  might need more.   12/27- will increase Baclofen if need be, on Thursday 12/29.   12/29- spasms much better controlled- con't 10 mg TID  1/5- Spasms doing much better not resolved, but doesn't impair function.  18. Bladder spasms/urinary overflow/leakage  12/26- will order Myrbetriq instead of Oxybutynin to help with bladder spasms.   12/27- no side effects- con't regimen  12/29- will increase caths to q4 hours- if this isn't enough, then will increase Myrbetriq for overflow leakage, but her volumes also very high.   12/30- volumes ~400cc- doing much better on q4 hours- will con't.   1/3- will increase Myrbetriq to 50 mg daily-   1/4- no side effects; no results yet with increase  1/5- feels like having less leakage- con't Myrbetriq 1/11- pt states having some higher volumes at times if she goes too long between caths   1/4- per  Dr Arnoldo Morale- he agrees no self caths/Bowel program until surgery, however can do after surgery may have some immediate post op restrictions .     LOS: 18 days A FACE TO FACE EVALUATION WAS PERFORMED  Charlett Blake 10/28/2021, 12:37 PM

## 2021-10-29 LAB — GLUCOSE, CAPILLARY
Glucose-Capillary: 112 mg/dL — ABNORMAL HIGH (ref 70–99)
Glucose-Capillary: 102 mg/dL — ABNORMAL HIGH (ref 70–99)
Glucose-Capillary: 113 mg/dL — ABNORMAL HIGH (ref 70–99)
Glucose-Capillary: 147 mg/dL — ABNORMAL HIGH (ref 70–99)

## 2021-10-29 NOTE — Progress Notes (Signed)
Request to cap PICC.  Patient has IV medications due at 1600.  Discussed with Autumn, LPN and will leave at [facility] to decrease infection risk of disconnecting and reconnecting the line.

## 2021-10-29 NOTE — Progress Notes (Signed)
Patient ID: [name], female   DOB: 1966-03-01, 56 y.o.   MRN: [id number]  SW waiting on updates from [name] about HHPT/OT/SN (wound care and IV abx) referral.   SW returned phone call to Tina/TCD Care Administrator (p:[phone]: [phone]) to discuss patient discharge. She is aware on pt d/c to acute and not knowing when pt will d/c from this service. Will fax extended aide hours form for completion.  SW faxed PCS change in medical condition form to [employer] [phone]) and Otila Kluver as well   [name], MSW, [place] Office: [phone] Cell: [phone] Fax: [phone]

## 2021-10-29 NOTE — Progress Notes (Signed)
This nurse administered Dulcolax 10mg  suppository at 20:40.

## 2021-10-29 NOTE — Progress Notes (Signed)
Physical Therapy Session Note  Patient Details  Name: Ellen Lane MRN: [id number] Date of Birth: 03/01/1966  Today's Date: 10/29/2021 PT Individual Time: 1530-1630 PT Individual Time Calculation (min): 60 min   Short Term Goals: Week 3:  PT Short Term Goal 1 (Week 3): =LTG due to ELOS  Skilled Therapeutic Interventions/Progress Updates:    Pt received sidelying in bed, agreeable to PT session. Pt found to be incontinent of stool and then has bladder incontinence during pericare. Assisted pt with pericare and donning of new brief and pants. Supine to sit with max A for BLE management and trunk elevation. Session focus on sitting balance EOB while performing ball toss 3 x 10 reps, 2# chest press with weighted dowel rod 3 x 10 reps, and bicep curls with 3# weighted dowel rod 3 x 10 reps. Pt requires CGA to min A to maintain sitting balance on deflated air mattress while performing activities. Pt returns to supine at end of session, mod A for BLE management. Pt found to again be incontinent of bowel and bladder, assisted with brief change. Pt left in R sidelying in bed with needs in reach at end of session.  Therapy Documentation Precautions:  Precautions Precautions: Fall, Back Precaution Comments: Brace donned if HOB >30; Don brace in SUPINE with abdominal binder on first to control excess skin around trunk. Required Braces or Orthoses: Spinal Brace Spinal Brace: Thoracolumbosacral orthotic, Applied in supine position Restrictions Weight Bearing Restrictions: No     Therapy/Group: Individual Therapy   Excell Seltzer, PT, DPT, CSRS  10/29/2021, 5:12 PM

## 2021-10-29 NOTE — Progress Notes (Signed)
Occupational Therapy Session Note  Patient Details  Name: Ellen Lane MRN: [id number] Date of Birth: 1965-12-29  Today's Date: 10/30/2021 OT Individual Time: 3875-6433 OT Individual Time Calculation (min): 60 min  Skilled Therapeutic Interventions/Progress Updates:    Pt greeted in bed with no c/o pain. Agreeable to engage in BADLs. Pt was set up in sidelying position to complete UB self care with setup assist. Max A for LB self care with OT assisting pt with passive stretching of lower limbs. Continued education regarding use of AE, directing care for contracture prevention, and methods to maximize functional independence with BADLs while adhering to back precautions. Setup for grooming tasks. Min A to roll Rt>Lt for back precaution adherence. Pt remained in bed at close of session, all needs within reach and 4 bedrails ups.   Therapy Documentation Precautions:  Precautions Precautions: Fall, Back Precaution Comments: Brace donned if HOB >30; Don brace in SUPINE with abdominal binder on first to control excess skin around trunk. Required Braces or Orthoses: Spinal Brace Spinal Brace: Thoracolumbosacral orthotic, Applied in supine position Restrictions Weight Bearing Restrictions: No ADL: ADL Eating: Not assessed Grooming: Supervision/safety Upper Body Bathing: Minimal assistance Lower Body Bathing: Dependent Upper Body Dressing: Maximal assistance Lower Body Dressing: Dependent Toileting: Not assessed  Therapy/Group: Individual Therapy  Sreeja Spies A Lezlee Gills 10/30/2021, 12:40 PM

## 2021-10-29 NOTE — Progress Notes (Signed)
Physical Therapy Session Note  Patient Details  Name: Ellen Lane MRN: [id number] Date of Birth: [birth year]  Today's Date: 10/29/2021 PT Individual Time: 0900-0915 PT Individual Time Calculation (min): 15 min   Short Term Goals: Week 3:  PT Short Term Goal 1 (Week 3): =LTG due to ELOS  Skilled Therapeutic Interventions/Progress Updates: Pt presented in bed agreeable to thearpy. Pt then receiving call from MD with pre-op questions which caused 15 min skilled PT. Due to upcoming scheduled I/O cath deferred any OOB transfers. Pt agreeable to BLE ROM/stretching. PTA performed manual stretch to hamstrings, heel cord, and ROM of hips. Pt remained in bed at end of session in supine with nsg notified prepared for cath and pt left with current needs met.      Therapy Documentation Precautions:  Precautions Precautions: Fall, Back Precaution Comments: Brace donned if HOB >30; Don brace in SUPINE with abdominal binder on first to control excess skin around trunk. Required Braces or Orthoses: Spinal Brace Spinal Brace: Thoracolumbosacral orthotic, Applied in supine position Restrictions Weight Bearing Restrictions: No General: PT Amount of Missed Time (min): 15 Minutes PT Missed Treatment Reason: Other (Comment) (MD call)   Therapy/Group: Individual Therapy  Solene Hereford Mariette Cowley, PTA  10/29/2021, 11:19 AM
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
## 2021-10-29 NOTE — Progress Notes (Signed)
Occupational Therapy Session Note  Patient Details  Name: Ellen Lane MRN: [id number] Date of Birth: April 28, 1966  Today's Date: 10/29/2021 OT Individual Time: 7366-8159 OT Individual Time Calculation (min): 59 min    Short Term Goals: Week 2:  OT Short Term Goal 1 (Week 2): Pt will perform slide board transfers bed <> wheelchair with Mod A of 1 in prep for IADL/ADL tasks OT Short Term Goal 1 - Progress (Week 2): Progressing toward goal OT Short Term Goal 2 (Week 2): Pt will use toilet aide with Mod A to wash/dry buttocks OT Short Term Goal 2 - Progress (Week 2): Met OT Short Term Goal 3 (Week 2): Pt will tolerate sitting EOM/EOB for 5 mins with no more than CGA OT Short Term Goal 3 - Progress (Week 2): Progressing toward goal Week 3:  OT Short Term Goal 1 (Week 3): STGs = LTGs 2/2 LOS  Skilled Therapeutic Interventions/Progress Updates:    Pt greeted at time of session semireclined bed level agreeable to OT session, wanting to perform morning routine for BADL session. Provided items needed at table top level at shoulder height for pt to reach witin precautions. Set up for UB bathing using LHS as needed, LB bathing Min/Mod A to wash buttocks thoroughly, utiilizing bed features and AE PRN for adaptive techniques and compensating. Pt able to wash periarea and BLEs with therapist bringing each LE to the pt, dried off same manner. Since PICC running, provided cut shirt to modify per pt request and donned bed level. Donned TLSO and binder with Max A bed level rolling L/R. Donned pants for the pt to maintain precautions up to thigh level in prep for nursing to cath later. Pt set up with all needs met, call bell in reach.   Therapy Documentation Precautions:  Precautions Precautions: Fall, Back Precaution Comments: Brace donned if HOB >30; Don brace in SUPINE with abdominal binder on first to control excess skin around trunk. Required Braces or Orthoses: Spinal Brace Spinal Brace:  Thoracolumbosacral orthotic, Applied in supine position Restrictions Weight Bearing Restrictions: No     Therapy/Group: Individual Therapy  [name] 10/29/2021, 7:09 AM

## 2021-10-29 NOTE — Progress Notes (Signed)
PROGRESS NOTE   Subjective/Complaints: Complaints of right sided chest lump and is concerned that spinal brace is worsening it. She follows with breast clinic outpatient and has been monitored for abnormal changes that have been stable  ROS:  Pt denies SOB, abd pain, CP, N/V/(+)C/D, and vision changes, +right chest discomfort    Objective:   No results found. No results for input(s): WBC, HGB, HCT, PLT in the last 72 hours.   No results for input(s): NA, K, CL, CO2, GLUCOSE, BUN, CREATININE, CALCIUM in the last 72 hours.    Intake/Output Summary (Last 24 hours) at 10/29/2021 1104 Last data filed at 10/29/2021 0951 Gross per 24 hour  Intake 1201.03 ml  Output 2625 ml  Net -1423.97 ml     Pressure Injury 09/24/21 Sacrum Stage 3 -  Full thickness tissue loss. Subcutaneous fat may be visible but bone, tendon or muscle are NOT exposed. 2 wounds noted, sacral, one superior to the other (Active)  09/24/21 1030  Location: Sacrum  Location Orientation:   Staging: Stage 3 -  Full thickness tissue loss. Subcutaneous fat may be visible but bone, tendon or muscle are NOT exposed.  Wound Description (Comments): 2 wounds noted, sacral, one superior to the other  Present on Admission: Yes    Physical Exam: Vital Signs Blood pressure 106/76, pulse 81, temperature 98.6 F (37 C), temperature source Oral, resp. rate 18, height '5\' 2"'  (1.575 m), weight 74.5 kg, last menstrual period 01/08/2015, SpO2 100 %.   General: No acute distress Mood and affect are appropriate Heart: Regular rate and rhythm no rubs murmurs or extra sounds Lungs: Clear to auscultation, breathing unlabored, no rales or wheezes Abdomen: Positive bowel sounds, soft nontender to palpation, nondistended    Extremities: No clubbing, cyanosis, or edema. Pulses are 2+- at rest, muscle atrophy/no Edema seen Skin: back incision cdi ; Coccyx- a little small scabbed  over ulcer- looks unstageable, but only partial thickness- very superficial- dime sized, 0.1 cm X 0.2 cm Neuro:  Alert and oriented x 3. Normal insight and awareness. Intact Memory. Normal language and speech. Cranial nerve exam unremarkable. ~T3 sensory level without sensation below level of injury. Stocking glove sensory loss in hands. Motor 0/5 LE, 4/5 in UE with some pain inhibition, ?peripheral weakness in HI's. DTR's 1+, no resting tone Musculoskeletal: mid back tenderness  Atrophy of LE's.   Assessment/Plan: 1. Functional deficits which require 3+ hours per day of interdisciplinary therapy in a comprehensive inpatient rehab setting. Physiatrist is providing close team supervision and 24 hour management of active medical problems listed below. Physiatrist and rehab team continue to assess barriers to discharge/monitor patient progress toward functional and medical goals  Care Tool:  Bathing    Body parts bathed by patient: Right arm, Left arm, Chest, Abdomen, Face, Front perineal area, Right upper leg, Left upper leg, Buttocks   Body parts bathed by helper: Right lower leg, Left lower leg     Bathing assist Assist Level: Moderate Assistance - Patient 50 - 74%     Upper Body Dressing/Undressing Upper body dressing   What is the patient wearing?: Pull over shirt, Orthosis    Upper body assist Assist  Level: Maximal Assistance - Patient 25 - 49%    Lower Body Dressing/Undressing Lower body dressing      What is the patient wearing?: Incontinence brief, Pants     Lower body assist Assist for lower body dressing: Maximal Assistance - Patient 25 - 49%     Toileting Toileting    Toileting assist Assist for toileting: Total Assistance - Patient < 25%     Transfers Chair/bed transfer  Transfers assist     Chair/bed transfer assist level: Moderate Assistance - Patient 50 - 74%     Locomotion Ambulation   Ambulation assist   Ambulation activity did not occur:  N/A          Walk 10 feet activity   Assist  Walk 10 feet activity did not occur: N/A        Walk 50 feet activity   Assist Walk 50 feet with 2 turns activity did not occur: N/A         Walk 150 feet activity   Assist Walk 150 feet activity did not occur: N/A         Walk 10 feet on uneven surface  activity   Assist Walk 10 feet on uneven surfaces activity did not occur: N/A         Wheelchair     Assist Is the patient using a wheelchair?: Yes Type of Wheelchair: Manual    Wheelchair assist level: Independent Max wheelchair distance: 150'    Wheelchair 50 feet with 2 turns activity    Assist        Assist Level: Independent   Wheelchair 150 feet activity     Assist      Assist Level: Independent   Blood pressure 106/76, pulse 81, temperature 98.6 F (37 C), temperature source Oral, resp. rate 18, height '5\' 2"'  (1.575 m), weight 74.5 kg, last menstrual period 01/08/2015, SpO2 100 %.  Medical Problem List and Plan: 1. Functional deficits secondary to thoracolumbar epidural abscess s/p T10-L3 laminotomy and decompression 09/28/21 in setting of T3 complete paraplegia             -patient may shower             -ELOS/Goals: 10-12 days, sup-min assist goals at w/c level  D/c 11/04/21- awaiting second stage thoracic spine surgery, with Dr. Arnoldo Morale.  Will likely be be discharged straight to neurosurgery Continue CIR- PT and OT 2.  Impaired mobility: Continue Lovenox             -antiplatelet therapy: N/a 3. Pain: Continue oxycodone prn for pain.  1/6- Pain controlle-d rarely taking meds- con't regimen 4. Mood: LCSW to follow for evaluation and support.              -antipsychotic agents:  5. Neuropsych: This patient is capable of making decisions on her own behalf. 6. 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  --Continue ensure max and Prosource for wound healing.   12/30- pt doing a good job staying off  backside when not in therapy.  1/2- will need to go home on Air mattress until Stage III healed completely. 1/4- since going back to acute before d/c, cannot set up prior- but will inform acute hospital with d/c.   1/6- wound healing so might not need air mattress at home- however pt said MIGHT have air mattress- doesn't remember- will check with nephew.  1/12: order air mattress and hospital bed.  7. Fluids/Electrolytes/Nutrition: encouraged PO.  1/2- labs look great- con't to monitor 8.  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 .  9. T2DM: Well controlled with A1C-6.0. Was on Ozempic PTA.  --CBG (last 3)  Recent Labs    10/28/21 1631 10/28/21 2111 10/29/21 0533  GLUCAP 134* 202* 112*  Fair control 1/11 1/7- Needs to still have Ozempic brought in- con't regimen fo rnow 10. HIV: Stable and undetectable viral load on Biktarvy 11. 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 OSA: Does not use CPAP 13. Morbid obesity s/p gastric bypass: Will resume multivitamin BID for supplement.   1/3- BMI- down to 30 14.  Stage III sacral decub: appear to me more like a stage II, now.  Photo on file is 12/8 and appears to be Stage III. What I see now looks much better.  Will continue air mattress for pressure relief measures             -- vitamin C and Zinc to promote wound healing.              --consult WOC for input.  1/4- is healing- now has scabbed over- con't to monitor/con't air mattress 15. Neurogenic bowel: will resume scheduled po dulcolax in AM and dulcolax supp in PM similar to what she does at home.   Freq BMs, 4x yesterday , likely looser related to abx, also used less pain meds yesterday, will monitor prior to changes  16. Neurogenic  bladder:             I/O cath schedule 4-5 x daily  12/27- going well except overflow- see #18  12/30- doing better with caths q4 hours 17. Spasticity  12/26- will increase Baclofen to 10 mg TID_ might need more.   12/27- will increase Baclofen if need be, on Thursday 12/29.   12/29- spasms much better controlled- con't 10 mg TID  1/5- Spasms doing much better not resolved, but doesn't impair function.  18. Bladder spasms/urinary overflow/leakage  12/26- will order Myrbetriq instead of Oxybutynin to help with bladder spasms.   12/27- no side effects- con't regimen  12/29- will increase caths to q4 hours- if this isn't enough, then will increase Myrbetriq for overflow leakage, but her volumes also very high.   12/30- volumes ~400cc- doing much better on q4 hours- will con't.   1/3- will increase Myrbetriq to 50 mg daily-   1/4- no side effects; no results yet with increase  1/5- feels like having less leakage- con't Myrbetriq 1/11- pt states having some higher volumes at times if she goes too long between caths   1/4- per  Dr Arnoldo Morale- he agrees no self caths/Bowel program until surgery, however can do after surgery may have some immediate post op restrictions . 19. Right sided chest discomfort: asked Katharine Look to see if we can get her a better fitting brace of cushioned brace to relieve discomfort 20. History of abnormal breast changes on cancer screening: discussed that she should follow-up with her outpatient provider on discharge if she notes changes in size of right breast lump.      LOS: 19 days A FACE TO FACE EVALUATION WAS PERFORMED  Ellen Lane P Icela Glymph 10/29/2021, 11:04 AM

## 2021-10-29 NOTE — Progress Notes (Signed)
Occupational Therapy Session Note  Patient Details  Name: Shalayah Beagley MRN: [id number] Date of Birth: 1965/11/07  Today's Date: 10/29/2021 OT Individual Time: 1015-1110 OT Individual Time Calculation (min): 55 min    Short Term Goals: Week 2:  OT Short Term Goal 1 (Week 2): Pt will perform slide board transfers bed <> wheelchair with Mod A of 1 in prep for IADL/ADL tasks OT Short Term Goal 1 - Progress (Week 2): Progressing toward goal OT Short Term Goal 2 (Week 2): Pt will use toilet aide with Mod A to wash/dry buttocks OT Short Term Goal 2 - Progress (Week 2): Met OT Short Term Goal 3 (Week 2): Pt will tolerate sitting EOM/EOB for 5 mins with no more than CGA OT Short Term Goal 3 - Progress (Week 2): Progressing toward goal  Skilled Therapeutic Interventions/Progress Updates:    Pt resting in bed upon arrival. OT interventin with focus on bed mobility, SB transfers, sitting balance, BUE therex, PWC mobility, and activity tolerance. Adjustment to TLSO per below. Supine>sit EOB with max A+1. Sitting balance EOB with min A and BUE support. SB transfer to [facility] with max A+1. BUE therex with 2# bar-rowing 12x3 and tapping ball 12x3. Pt also completed chest presses with 3# bar-8x2. Pt returned to room and tranfserred back to bed. SB with max A+1. Pt remained in bed with all needs within reach.  Therapy Documentation Precautions:  Precautions Precautions: Fall, Back Precaution Comments: Brace donned if HOB >30; Don brace in SUPINE with abdominal binder on first to control excess skin around trunk. Required Braces or Orthoses: Spinal Brace Spinal Brace: Thoracolumbosacral orthotic, Applied in supine position Restrictions Weight Bearing Restrictions: No   Pain:  Pt c/o Rt breast discomfort from TLSO (RN and MD aware); TLSO adjusted with relief   Therapy/Group: Individual Therapy  Leroy Libman 10/29/2021, 11:13 AM

## 2021-10-30 LAB — GLUCOSE, CAPILLARY
Glucose-Capillary: 108 mg/dL — ABNORMAL HIGH (ref 70–99)
Glucose-Capillary: 91 mg/dL (ref 70–99)
Glucose-Capillary: 114 mg/dL — ABNORMAL HIGH (ref 70–99)
Glucose-Capillary: 119 mg/dL — ABNORMAL HIGH (ref 70–99)

## 2021-10-30 MED ORDER — SODIUM CHLORIDE 0.9 % IV SOLN
2.0000 g | Freq: Three times a day (TID) | INTRAVENOUS | Status: DC
  Administered 2021-10-30 – 2021-11-04 (×15): 2 g via INTRAVENOUS
  Filled 2021-10-30 (×15): qty 2

## 2021-10-30 NOTE — Progress Notes (Signed)
Recreational Therapy Session Note  Patient Details  Name: Ellen Lane MRN: [id number] Date of Birth: October 16, 1966 Today's Date: 10/30/2021 Time:  11-12 Pain: no c/o Skilled Therapeutic Interventions/Progress Updates: Pt participated in stress managment/coping group today.  Pt education/discussion focused on stress exploration including factors that contribute to stress, factors that protect against stress and potential coping strategies.  Coping strategies included deep breathing, progressive muscle relaxation, imagery & challenging irrational thoughts.  Handouts provided.  Pt appreciative of group session.   Therapy/Group: Group Therapy   Ellen Lane 10/30/2021, 1:07 PM

## 2021-10-30 NOTE — Progress Notes (Signed)
Patient ID: Ellen Lane, female   DOB: 1965/11/02, 56 y.o.   MRN: [id number]  Still no follow-up from [name].  SW sent HHPT/OT/SN (wound care and IV abx)  referral to [facility].  -referral declined.  Referral accepted by [facility].  [name], MSW, [place] Office: [phone] Cell: [phone] Fax: [phone]

## 2021-10-30 NOTE — Progress Notes (Signed)
Occupational Therapy Session Note  Patient Details  Name: Ellen Lane MRN: [id number] Date of Birth: [birth year]  Today's Date: 10/30/2021 OT Group Time: 1100-1200 OT Group Time Calculation (min): 60 min   Short Term Goals: Week 3:  OT Short Term Goal 1 (Week 3): STGs = LTGs 2/2 LOS  Skilled Therapeutic Interventions/Progress Updates:  Pt participated in group session with a focus on stress mgmt, education on healthy coping strategies, and social interaction. Focus of session on providing coping strategies to manage new current level of function as a result of new diagnosis.  Session focus on breaking down stressors into daily hassles, major life stressors and life circumstances in an effort to allow pts to chunk their stressors into groups. Pt actively sharing stressors and contributing to group conversation. Provided active listening, emotional support and therapeutic use of self. Offered education on factors that protect [place] against stress such as daily uplifts, healthy coping strategies and protective factors. Encouraged all group members to make an effort to actively recall one event from their day that was a daily uplift in an effort to protect their mindset from stressors as well as sharing this information with their caregivers to facilitate improved caregiver communication and decrease overall burden of care.  Issued pt handouts on healthy coping strategies to implement into routine. Pt transported back to room by this OTA where pt completed SB transfer back to bed with MODA. Pt left in sidelying with NT present.   Therapy Documentation Precautions:  Precautions Precautions: Fall, Back Precaution Comments: Brace donned if HOB >30; Don brace in SUPINE with abdominal binder on first to control excess skin around trunk. Required Braces or Orthoses: Spinal Brace Spinal Brace: Thoracolumbosacral orthotic, Applied in supine position Restrictions Weight Bearing  Restrictions: No  Pain: no pain reported during session     Therapy/Group: Group Therapy  Precious Haws 10/30/2021, 12:41 PM

## 2021-10-30 NOTE — Progress Notes (Signed)
PROGRESS NOTE   Subjective/Complaints: Appreciate Ellen Lane adjusting her brace- discomfort subsided She notes that she is limited by her IV in AM therapy session. She wakes by 5- would be agreeable to me adjusting medication timing so she receives earlier in the day  ROS:  Pt denies SOB, abd pain, CP, N/V/(+)C/D, and vision changes, +right chest discomfort- resolved    Objective:   No results found. No results for input(s): WBC, HGB, HCT, PLT in the last 72 hours.   No results for input(s): NA, K, CL, CO2, GLUCOSE, BUN, CREATININE, CALCIUM in the last 72 hours.    Intake/Output Summary (Last 24 hours) at 10/30/2021 1311 Last data filed at 10/30/2021 0930 Gross per 24 hour  Intake 720 ml  Output 1925 ml  Net -1205 ml     Pressure Injury 09/24/21 Sacrum Stage 3 -  Full thickness tissue loss. Subcutaneous fat may be visible but bone, tendon or muscle are NOT exposed. 2 wounds noted, sacral, one superior to the other (Active)  09/24/21 1030  Location: Sacrum  Location Orientation:   Staging: Stage 3 -  Full thickness tissue loss. Subcutaneous fat may be visible but bone, tendon or muscle are NOT exposed.  Wound Description (Comments): 2 wounds noted, sacral, one superior to the other  Present on Admission: Yes    Physical Exam: Vital Signs Blood pressure 129/81, pulse 85, temperature 98.3 F (36.8 C), resp. rate 16, height '5\' 2"'  (1.575 m), weight 74.5 kg, last menstrual period 01/08/2015, SpO2 97 %.  Gen: no distress, normal appearing HEENT: oral mucosa pink and moist, NCAT Cardio: Reg rate Chest: normal effort, normal rate of breathing Abd: soft, non-distended  Extremities: No clubbing, cyanosis, or edema. Pulses are 2+- at rest, muscle atrophy/no Edema seen Skin: back incision cdi ; Coccyx- a little small scabbed over ulcer- looks unstageable, but only partial thickness- very superficial- dime sized, 0.1 cm X 0.2  cm Neuro:  Alert and oriented x 3. Normal insight and awareness. Intact Memory. Normal language and speech. Cranial nerve exam unremarkable. ~T3 sensory level without sensation below level of injury. Stocking glove sensory loss in hands. Motor 0/5 LE, 4/5 in UE with some pain inhibition, ?peripheral weakness in HI's. DTR's 1+, no resting tone Musculoskeletal: mid back tenderness  Atrophy of LE's.   Assessment/Plan: 1. Functional deficits which require 3+ hours per day of interdisciplinary therapy in a comprehensive inpatient rehab setting. Physiatrist is providing close team supervision and 24 hour management of active medical problems listed below. Physiatrist and rehab team continue to assess barriers to discharge/monitor patient progress toward functional and medical goals  Care Tool:  Bathing    Body parts bathed by patient: Right arm, Left arm, Chest, Abdomen, Face, Front perineal area, Right upper leg, Left upper leg, Buttocks   Body parts bathed by helper: Right lower leg, Left lower leg     Bathing assist Assist Level: Moderate Assistance - Patient 50 - 74%     Upper Body Dressing/Undressing Upper body dressing   What is the patient wearing?: Pull over shirt, Orthosis    Upper body assist Assist Level: Maximal Assistance - Patient 25 - 49%    Lower  Body Dressing/Undressing Lower body dressing      What is the patient wearing?: Incontinence brief, Pants     Lower body assist Assist for lower body dressing: Maximal Assistance - Patient 25 - 49%     Toileting Toileting    Toileting assist Assist for toileting: Total Assistance - Patient < 25%     Transfers Chair/bed transfer  Transfers assist     Chair/bed transfer assist level: Moderate Assistance - Patient 50 - 74%     Locomotion Ambulation   Ambulation assist   Ambulation activity did not occur: N/A          Walk 10 feet activity   Assist  Walk 10 feet activity did not occur: N/A         Walk 50 feet activity   Assist Walk 50 feet with 2 turns activity did not occur: N/A         Walk 150 feet activity   Assist Walk 150 feet activity did not occur: N/A         Walk 10 feet on uneven surface  activity   Assist Walk 10 feet on uneven surfaces activity did not occur: N/A         Wheelchair     Assist Is the patient using a wheelchair?: Yes Type of Wheelchair: Manual    Wheelchair assist level: Independent Max wheelchair distance: 150'    Wheelchair 50 feet with 2 turns activity    Assist        Assist Level: Independent   Wheelchair 150 feet activity     Assist      Assist Level: Independent   Blood pressure 129/81, pulse 85, temperature 98.3 F (36.8 C), resp. rate 16, height '5\' 2"'  (1.575 m), weight 74.5 kg, last menstrual period 01/08/2015, SpO2 97 %.  Medical Problem List and Plan: 1. Functional deficits secondary to thoracolumbar epidural abscess s/p T10-L3 laminotomy and decompression 09/28/21 in setting of T3 complete paraplegia             -patient may shower             -ELOS/Goals: 10-12 days, sup-min assist goals at w/c level  D/c 11/04/21- awaiting second stage thoracic spine surgery, with Dr. Arnoldo Morale.  Will likely be be discharged straight to neurosurgery Continue CIR- PT and OT 2.  Impaired mobility: Continue Lovenox             -antiplatelet therapy: N/a 3. Pain: Continue oxycodone prn for pain.  1/6- Pain controlle-d rarely taking meds- con't regimen 4. Mood: LCSW to follow for evaluation and support.              -antipsychotic agents:  5. Neuropsych: This patient is capable of making decisions on her own behalf. 6. 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  --Continue ensure max and Prosource for wound healing.   12/30- pt doing a good job staying off backside when not in therapy.  1/2- will need to go home on Air mattress until Stage III healed completely. 1/4- since going back  to acute before d/c, cannot set up prior- but will inform acute hospital with d/c.   1/6- wound healing so might not need air mattress at home- however pt said MIGHT have air mattress- doesn't remember- will check with nephew.  1/12: order air mattress and hospital bed.  7. Fluids/Electrolytes/Nutrition: encouraged PO.  1/2- labs look great- con't to monitor 8.  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 Adjusted medication timing so she receives at 7am rather than during her 8am therapy session.              12/26- will order CRP/ESR, CBC with diff and CMP weekly 1/4- ESR 42- stable. .  9. T2DM: Well controlled with A1C-6.0. Was on Ozempic PTA.  --CBG (last 3)  Recent Labs    10/29/21 2114 10/30/21 0543 10/30/21 1208  GLUCAP 147* 108* 91  Fair control 1/11 1/7- Needs to still have Ozempic brought in- con't regimen fo rnow 10. HIV: Stable and undetectable viral load on Biktarvy 11. 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 OSA: Does not use CPAP 13. Morbid obesity s/p gastric bypass: Will resume multivitamin BID for supplement.   1/3- BMI- down to 30 14.  Stage III sacral decub: appear to me more like a stage II, now.  Photo on file is 12/8 and appears to be Stage III. What I see now looks much better.  Will continue air mattress for pressure relief measures             -- vitamin C and Zinc to promote wound healing.              --consult WOC for input.  1/4- is healing- now has scabbed over- con't to monitor/con't air mattress 15. Neurogenic bowel: will resume scheduled po dulcolax in AM and dulcolax supp in PM similar to what she does at home.   Freq BMs, 4x yesterday , likely looser related to abx, also used less pain meds yesterday, will monitor prior to changes  16. Neurogenic bladder:             I/O cath schedule 4-5 x  daily  12/27- going well except overflow- see #18  12/30- doing better with caths q4 hours 17. Spasticity  12/26- will increase Baclofen to 10 mg TID_ might need more.   12/27- will increase Baclofen if need be, on Thursday 12/29.   12/29- spasms much better controlled- con't 10 mg TID  1/5- Spasms doing much better not resolved, but doesn't impair function.  18. Bladder spasms/urinary overflow/leakage  12/26- will order Myrbetriq instead of Oxybutynin to help with bladder spasms.   12/27- no side effects- con't regimen  12/29- will increase caths to q4 hours- if this isn't enough, then will increase Myrbetriq for overflow leakage, but her volumes also very high.   12/30- volumes ~400cc- doing much better on q4 hours- will con't.   1/3- will increase Myrbetriq to 50 mg daily-   1/4- no side effects; no results yet with increase  1/5- feels like having less leakage- con't Myrbetriq 1/11- pt states having some higher volumes at times if she goes too long between caths   1/4- per  Dr Arnoldo Morale- he agrees no self caths/Bowel program until surgery, however can do after surgery may have some immediate post op restrictions . 19. Right sided chest discomfort: asked Katharine Look to see if we can get her a better fitting brace of cushioned brace to relieve discomfort 20. History of abnormal breast changes on cancer screening: discussed that she should follow-up with her outpatient provider on discharge if she notes changes in size of right breast lump.      LOS: 20 days A FACE TO FACE EVALUATION WAS PERFORMED  Hulan Szumski P Cyler Kappes 10/30/2021, 1:11 PM

## 2021-10-30 NOTE — Progress Notes (Signed)
Physical Therapy Session Note  Patient Details  Name: Ellen Lane MRN: [id number] Date of Birth: 14-Jun-1966  Today's Date: 10/30/2021 PT Individual Time: 1007-1100 PT Individual Time Calculation (min): 53 min   Short Term Goals: Week 3:  PT Short Term Goal 1 (Week 3): =LTG due to ELOS  Skilled Therapeutic Interventions/Progress Updates: Pt presented in bed agreeable to therapy. Pt denies pain throughout session. Performed rolling L/R with minA to pull pants over hips. Pt then performed supine to sit to EOB with maxA. Pt able to lean to L to allow PTA to place Slide board and required modA to come back to sitting. Performed Slide board transfer to [facility] with modA. Upon sitting in PWC noted that TLSO shifted therefore pt reclined chair and PTA was able to adjust. Pt then requesting to go outside with PTA agreeable. Pt able to drive PWC independently to [facility] entrance mod I. Pt very appreciative for ~10 min outside for improved mental state. Pt returned to ortho gym driving [place] at mod I level and participated in UBE L4 x 5 min for general conditioning.  Once completed pt handed off off to [place], RT in ortho gym for group session with current needs met.      Therapy Documentation Precautions:  Precautions Precautions: Fall, Back Precaution Comments: Brace donned if HOB >30; Don brace in SUPINE with abdominal binder on first to control excess skin around trunk. Required Braces or Orthoses: Spinal Brace Spinal Brace: Thoracolumbosacral orthotic, Applied in supine position Restrictions Weight Bearing Restrictions: No General:   Vital Signs: Therapy Vitals Pulse Rate: 85 Resp: 16 BP: 129/81 Patient Position (if appropriate): Lying Oxygen Therapy O2 Device: Room Air Pain:   Mobility:   Locomotion :    Trunk/Postural Assessment :    Balance:   Exercises:   Other Treatments:      Therapy/Group: Individual Therapy  Darshana Curnutt 10/30/2021, 3:50 PM

## 2021-10-30 NOTE — Progress Notes (Signed)
Occupational Therapy Session Note  Patient Details  Name: Ellen Lane MRN: [id number] Date of Birth: Jun 10, 1966  Today's Date: 10/30/2021 OT Individual Time: 1300-1330 OT Individual Time Calculation (min): 30 min    Short Term Goals: Week 3:  OT Short Term Goal 1 (Week 3): STGs = LTGs 2/2 LOS  Skilled Therapeutic Interventions/Progress Updates:    Pt resting in bed upon arrival with TLSO doffed and pants below knees awaiting nursing for I/O cath. Pt agreeable to bed level BUE therex. Chest presses with 1kg ball 3x12, unilateral presses with 2# barbell 3x10, unilateral curls with 3# barbell 3x12. Pt remained in bed with all needs within reach.   Therapy Documentation Precautions:  Precautions Precautions: Fall, Back Precaution Comments: Brace donned if HOB >30; Don brace in SUPINE with abdominal binder on first to control excess skin around trunk. Required Braces or Orthoses: Spinal Brace Spinal Brace: Thoracolumbosacral orthotic, Applied in supine position Restrictions Weight Bearing Restrictions: No Pain:  Pt denies pain this afternoon   Therapy/Group: Individual Therapy  Leroy Libman 10/30/2021, 1:36 PM

## 2021-10-31 LAB — GLUCOSE, CAPILLARY
Glucose-Capillary: 105 mg/dL — ABNORMAL HIGH (ref 70–99)
Glucose-Capillary: 91 mg/dL (ref 70–99)
Glucose-Capillary: 106 mg/dL — ABNORMAL HIGH (ref 70–99)
Glucose-Capillary: 194 mg/dL — ABNORMAL HIGH (ref 70–99)

## 2021-10-31 NOTE — Progress Notes (Signed)
Occupational Therapy Session Note  Patient Details  Name: Ellen Lane MRN: [id number] Date of Birth: 1966/05/20  Today's Date: 11/01/2021 OT Individual Time: 9179-1505 OT Individual Time Calculation (min): 47 min   Skilled Therapeutic Interventions/Progress Updates:    Pt greeted in bed, requesting to engage in bathing/dressing tasks during session. Pt completed BADL mostly in sidelying position with HOB <30 degrees for precaution adherence. Pt able to wash lower legs without bending back, Min A for rolling Rt>Lt for LE positioning. Pt used modified toilet tongs to complete perihygiene, OT providing Min A to ensure thoroughness. Max A for UB dressing including overhead shirt, abdominal binder, and TLSO. +2 assist for boosting pt up in bed, left pt comfortably with all needs within reach and 4 bedrails up. Tx focus placed on adaptive self care skills, activity tolerance, and precaution adherence during functional activity.   Therapy Documentation Precautions:  Precautions Precautions: Fall, Back Precaution Comments: Brace donned if HOB >30; Don brace in SUPINE with abdominal binder on first to control excess skin around trunk. Required Braces or Orthoses: Spinal Brace Spinal Brace: Thoracolumbosacral orthotic, Applied in supine position Restrictions Weight Bearing Restrictions: No Pain: no c/o pain during session   ADL: ADL Eating: Not assessed Grooming: Supervision/safety Upper Body Bathing: Minimal assistance Lower Body Bathing: Dependent Upper Body Dressing: Maximal assistance Lower Body Dressing: Dependent Toileting: Not assessed  Therapy/Group: Individual Therapy  Masson Nalepa A Stephan Draughn 11/01/2021, 12:22 PM

## 2021-10-31 NOTE — Progress Notes (Signed)
PROGRESS NOTE   Subjective/Complaints: Asking about how NS plans to do back surgery  Discussed post op possiblity of returning to rehab if her fxnl status declines although per Dr Lorenda Peck should be able to start self cath again post op   ROS:  Pt denies SOB, abd pain, CP, N/V/(+)C/D,    Objective:   No results found. No results for input(s): WBC, HGB, HCT, PLT in the last 72 hours.   No results for input(s): NA, K, CL, CO2, GLUCOSE, BUN, CREATININE, CALCIUM in the last 72 hours.    Intake/Output Summary (Last 24 hours) at 10/31/2021 1223 Last data filed at 10/31/2021 0930 Gross per 24 hour  Intake 1360 ml  Output 2750 ml  Net -1390 ml          Physical Exam: Vital Signs Blood pressure 117/74, pulse 81, temperature 98.3 F (36.8 C), temperature source Oral, resp. rate 16, height 5' 2" (1.575 m), weight 74.5 kg, last menstrual period 01/08/2015, SpO2 100 %.   General: No acute distress Mood and affect are appropriate Heart: Regular rate and rhythm no rubs murmurs or extra sounds Lungs: Clear to auscultation, breathing unlabored, no rales or wheezes Abdomen: Positive bowel sounds, soft nontender to palpation, nondistended Extremities: No clubbing, cyanosis, or edema    Extremities: No clubbing, cyanosis, or edema. Pulses are 2+- at rest, muscle atrophy/no Edema seen Skin: back incision cdi ; Coccyx- a little small scabbed over ulcer- looks unstageable, but only partial thickness- very superficial- dime sized, 0.1 cm X 0.2 cm Neuro:  Alert and oriented x 3. Normal insight and awareness. Intact Memory. Normal language and speech. Cranial nerve exam unremarkable. ~T3 sensory level without sensation below level of injury. Stocking glove sensory loss in hands. Motor 0/5 LE, 4/5 in UE with some pain inhibition, ?peripheral weakness in HI's. DTR's 1+, no resting tone Musculoskeletal: mid back tenderness  Atrophy of LE's.    Assessment/Plan: 1. Functional deficits which require 3+ hours per day of interdisciplinary therapy in a comprehensive inpatient rehab setting. Physiatrist is providing close team supervision and 24 hour management of active medical problems listed below. Physiatrist and rehab team continue to assess barriers to discharge/monitor patient progress toward functional and medical goals  Care Tool:  Bathing    Body parts bathed by patient: Right arm, Left arm, Chest, Abdomen, Face, Front perineal area, Right upper leg, Left upper leg, Buttocks   Body parts bathed by helper: Right lower leg, Left lower leg     Bathing assist Assist Level: Moderate Assistance - Patient 50 - 74%     Upper Body Dressing/Undressing Upper body dressing   What is the patient wearing?: Pull over shirt, Orthosis    Upper body assist Assist Level: Maximal Assistance - Patient 25 - 49%    Lower Body Dressing/Undressing Lower body dressing      What is the patient wearing?: Incontinence brief, Pants     Lower body assist Assist for lower body dressing: Maximal Assistance - Patient 25 - 49%     Toileting Toileting    Toileting assist Assist for toileting: Total Assistance - Patient < 25%     Transfers Chair/bed transfer  Transfers assist     Chair/bed transfer assist level: Moderate Assistance - Patient 50 - 74%     Locomotion Ambulation   Ambulation assist   Ambulation activity did not occur: N/A          Walk 10 feet activity   Assist  Walk 10 feet activity did not occur: N/A        Walk 50 feet activity   Assist Walk 50 feet with 2 turns activity did not occur: N/A         Walk 150 feet activity   Assist Walk 150 feet activity did not occur: N/A         Walk 10 feet on uneven surface  activity   Assist Walk 10 feet on uneven surfaces activity did not occur: N/A         Wheelchair     Assist Is the patient using a wheelchair?: Yes Type of  Wheelchair: Manual    Wheelchair assist level: Independent Max wheelchair distance: 150'    Wheelchair 50 feet with 2 turns activity    Assist        Assist Level: Independent   Wheelchair 150 feet activity     Assist      Assist Level: Independent   Blood pressure 117/74, pulse 81, temperature 98.3 F (36.8 C), temperature source Oral, resp. rate 16, height 5' 2" (1.575 m), weight 74.5 kg, last menstrual period 01/08/2015, SpO2 100 %.  Medical Problem List and Plan: 1. Functional deficits secondary to thoracolumbar epidural abscess s/p T10-L3 laminotomy and decompression 09/28/21 in setting of T3 complete paraplegia             -patient may shower             -ELOS/Goals: 10-12 days, sup-min assist goals at w/c level  D/c 11/04/21- awaiting second stage thoracic spine surgery, with Dr. Arnoldo Morale.  Will be discharged straight to neurosurgery Continue CIR- PT and OT 2.  Impaired mobility: Continue Lovenox             -antiplatelet therapy: N/a 3. Pain: Continue oxycodone prn for pain.  1/6- Pain controlle-d rarely taking meds- con't regimen 4. Mood: LCSW to follow for evaluation and support.              -antipsychotic agents:  5. Neuropsych: This patient is capable of making decisions on her own behalf. 6. 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  --Continue ensure max and Prosource for wound healing.   12/30- pt doing a good job staying off backside when not in therapy.  1/2- will need to go home on Air mattress until Stage III healed completely. 1/4- since going back to acute before d/c, cannot set up prior- but will inform acute hospital with d/c.   1/6- wound healing so might not need air mattress at home- however pt said MIGHT have air mattress- doesn't remember- will check with nephew.  1/12: order air mattress and hospital bed.  7. Fluids/Electrolytes/Nutrition: encouraged PO.  1/2- labs look great- con't to monitor 8.  T10/11 discitis  with thoracolumbar epidural abscess, osteomyelitis and instability : On IV cefepime/daptomycin for 8 weeks from OR with end date 11/22/21. Adjusted medication timing so she receives at 7am rather than during her 8am therapy session.              12/26- will order CRP/ESR, CBC with diff and CMP weekly 1/4- ESR 42- stable. .  9. T2DM: Well  controlled with A1C-6.0. Was on Ozempic PTA.  --CBG (last 3)  Recent Labs    10/30/21 2052 10/31/21 0609 10/31/21 1119  GLUCAP 119* 105* 91   Fair control 1/11 1/7- Needs to still have Ozempic brought in- con't regimen fo rnow 10. HIV: Stable and undetectable viral load on Biktarvy 11. 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 OSA: Does not use CPAP 13. Morbid obesity s/p gastric bypass: Will resume multivitamin BID for supplement.   1/3- BMI- down to 30 14.  Stage III sacral decub: appear to me more like a stage II, now.  Photo on file is 12/8 and appears to be Stage III. What I see now looks much better.  Will continue air mattress for pressure relief measures             -- vitamin C and Zinc to promote wound healing.              --consult WOC for input.  1/4- is healing- now has scabbed over- con't to monitor/con't air mattress 15. Neurogenic bowel: will resume scheduled po dulcolax in AM and dulcolax supp in PM similar to what she does at home.   Freq BMs, 4x yesterday , likely looser related to abx, also used less pain meds yesterday, will monitor prior to changes  16. Neurogenic bladder:             I/O cath schedule 4-5 x daily  12/27- going well except overflow- see #18  12/30- doing better with caths q4 hours 17. Spasticity  12/26- will increase Baclofen to 10 mg TID_ might need more.   12/27- will increase Baclofen if need be, on Thursday 12/29.   12/29- spasms much better controlled- con't 10 mg TID  1/5- Spasms doing much better not resolved, but doesn't impair function.  18. Bladder  spasms/urinary overflow/leakage  12/26- will order Myrbetriq instead of Oxybutynin to help with bladder spasms.   12/27- no side effects- con't regimen  12/29- will increase caths to q4 hours- if this isn't enough, then will increase Myrbetriq for overflow leakage, but her volumes also very high.   12/30- volumes ~400cc- doing much better on q4 hours- will con't.   1/3- will increase Myrbetriq to 50 mg daily-   1/4- no side effects; no results yet with increase  1/5- feels like having less leakage- con't Myrbetriq 1/11- pt states having some higher volumes at times if she goes too long between caths   1/4- per  Dr Arnoldo Morale- he agrees no self caths/Bowel program until surgery, however can do after surgery no immediate post op restrictions .  20. History of abnormal breast changes on cancer screening: discussed that she should follow-up with her outpatient provider on discharge if she notes changes in size of right breast lump.      LOS: 21 days A FACE TO FACE EVALUATION WAS PERFORMED  Charlett Blake 10/31/2021, 12:23 PM

## 2021-10-31 NOTE — Accreditation Note (Signed)
Bowel program initiated this shift, patient tolerated well. Oncoming nurse aware. No BM prior to departure.  Yehuda Mao, LPN

## 2021-11-01 LAB — GLUCOSE, CAPILLARY
Glucose-Capillary: 107 mg/dL — ABNORMAL HIGH (ref 70–99)
Glucose-Capillary: 108 mg/dL — ABNORMAL HIGH (ref 70–99)
Glucose-Capillary: 120 mg/dL — ABNORMAL HIGH (ref 70–99)
Glucose-Capillary: 113 mg/dL — ABNORMAL HIGH (ref 70–99)

## 2021-11-01 NOTE — Progress Notes (Addendum)
Bowel program completed this shift, patient had a large bowel movement. Patient tolerated bowel program well. Oncoming nurse aware.   Yehuda Mao, LPN

## 2021-11-01 NOTE — Progress Notes (Signed)
Physical Therapy Session Note  Patient Details  Name: Ellen Lane MRN: [id number] Date of Birth: Aug 11, 1966  Today's Date: 11/01/2021 PT Individual Time: 1445-1538 PT Individual Time Calculation (min): 53 min   Short Term Goals: Week 1:  PT Short Term Goal 1 (Week 1): Pt will perform rolling L/R with min A consistently PT Short Term Goal 1 - Progress (Week 1): Met PT Short Term Goal 2 (Week 1): Pt will perform supine to/from sit with assist x 1 consistently PT Short Term Goal 2 - Progress (Week 1): Progressing toward goal PT Short Term Goal 3 (Week 1): Pt will perform least restrictive transfer with assist x 1 PT Short Term Goal 3 - Progress (Week 1): Progressing toward goal PT Short Term Goal 4 (Week 1): Pt will maintain static sitting balance x 5 min with min A PT Short Term Goal 4 - Progress (Week 1): Met Week 2:  PT Short Term Goal 1 (Week 2): Pt will perform bed mobility with assist x 1 PT Short Term Goal 1 - Progress (Week 2): Met PT Short Term Goal 2 (Week 2): Pt will perform least restrictive transfer with assist x 1 PT Short Term Goal 2 - Progress (Week 2): Met PT Short Term Goal 3 (Week 2): Pt will recall pressure relief schedule independently PT Short Term Goal 3 - Progress (Week 2): Met Week 3:  PT Short Term Goal 1 (Week 3): =LTG due to ELOS  Skilled Therapeutic Interventions/Progress Updates:    pt received in bed and agreeable to therapy. No complaint of pain. Pt assisted with pulling pants over hips, min A to roll. Mod A supine<> sit for LE management and some trunk management. slideboard transfer with min A, +2 for safety. Pt navigated power chair to day room. Pt used table top UBE 3 x 5 min with 2 min rest breaks for global endurance and UE strength. Pt then returned to room and to bed in same manner. Doffed shoes with tot A, and TLSO with min A. Doffed pants and shirts with mod A and donned gown independently. Pt remained in bed and was left with all needs in  reach and alarm active.   Therapy Documentation Precautions:  Precautions Precautions: Fall, Back Precaution Comments: Brace donned if HOB >30; Don brace in SUPINE with abdominal binder on first to control excess skin around trunk. Required Braces or Orthoses: Spinal Brace Spinal Brace: Thoracolumbosacral orthotic, Applied in supine position Restrictions Weight Bearing Restrictions: No General:      Therapy/Group: Individual Therapy  Mickel Fuchs 11/01/2021, 3:10 PM

## 2021-11-02 ENCOUNTER — Inpatient Hospital Stay (HOSPITAL_COMMUNITY): Payer: Medicare Other

## 2021-11-02 DIAGNOSIS — M4620 Osteomyelitis of vertebra, site unspecified: Secondary | ICD-10-CM

## 2021-11-02 DIAGNOSIS — G062 Extradural and subdural abscess, unspecified: Secondary | ICD-10-CM

## 2021-11-02 LAB — CBC WITH DIFFERENTIAL/PLATELET
Abs Immature Granulocytes: 0.01 10*3/uL (ref 0.00–0.07)
Basophils Absolute: 0 10*3/uL (ref 0.0–0.1)
Basophils Relative: 1 %
Eosinophils Absolute: 0.1 10*3/uL (ref 0.0–0.5)
Eosinophils Relative: 2 %
HCT: 29.2 % — ABNORMAL LOW (ref 36.0–46.0)
Hemoglobin: 9.6 g/dL — ABNORMAL LOW (ref 12.0–15.0)
Immature Granulocytes: 0 %
Lymphocytes Relative: 39 %
Lymphs Abs: 2 10*3/uL (ref 0.7–4.0)
MCH: 27.2 pg (ref 26.0–34.0)
MCHC: 32.9 g/dL (ref 30.0–36.0)
MCV: 82.7 fL (ref 80.0–100.0)
Monocytes Absolute: 0.5 10*3/uL (ref 0.1–1.0)
Monocytes Relative: 11 %
Neutro Abs: 2.4 10*3/uL (ref 1.7–7.7)
Neutrophils Relative %: 47 %
Platelets: 246 10*3/uL (ref 150–400)
RBC: 3.53 MIL/uL — ABNORMAL LOW (ref 3.87–5.11)
RDW: 15.3 % (ref 11.5–15.5)
WBC: 5.1 10*3/uL (ref 4.0–10.5)
nRBC: 0 % (ref 0.0–0.2)

## 2021-11-02 LAB — SEDIMENTATION RATE: Sed Rate: 75 mm/hr — ABNORMAL HIGH (ref 0–22)

## 2021-11-02 LAB — COMPREHENSIVE METABOLIC PANEL
ALT: 29 U/L (ref 0–44)
AST: 22 U/L (ref 15–41)
Albumin: 2.5 g/dL — ABNORMAL LOW (ref 3.5–5.0)
Alkaline Phosphatase: 160 U/L — ABNORMAL HIGH (ref 38–126)
Anion gap: 4 — ABNORMAL LOW (ref 5–15)
BUN: 18 mg/dL (ref 6–20)
CO2: 26 mmol/L (ref 22–32)
Calcium: 9.1 mg/dL (ref 8.9–10.3)
Chloride: 106 mmol/L (ref 98–111)
Creatinine, Ser: 0.44 mg/dL (ref 0.44–1.00)
GFR, Estimated: >60 mL/min (ref >60)
Glucose, Bld: 107 mg/dL — ABNORMAL HIGH (ref 70–99)
Potassium: 4.2 mmol/L (ref 3.5–5.1)
Sodium: 136 mmol/L (ref 135–145)
Total Bilirubin: 0.3 mg/dL (ref 0.3–1.2)
Total Protein: 6.4 g/dL — ABNORMAL LOW (ref 6.5–8.1)

## 2021-11-02 LAB — CK: Total CK: 105 U/L (ref 38–234)

## 2021-11-02 LAB — GLUCOSE, CAPILLARY
Glucose-Capillary: 97 mg/dL (ref 70–99)
Glucose-Capillary: 92 mg/dL (ref 70–99)
Glucose-Capillary: 103 mg/dL — ABNORMAL HIGH (ref 70–99)
Glucose-Capillary: 129 mg/dL — ABNORMAL HIGH (ref 70–99)

## 2021-11-02 LAB — C-REACTIVE PROTEIN: CRP: <0.5 mg/dL (ref <1.0)

## 2021-11-02 MED ORDER — CHLORHEXIDINE GLUCONATE CLOTH 2 % EX PADS
6.0000 | MEDICATED_PAD | Freq: Two times a day (BID) | CUTANEOUS | Status: DC
  Administered 2021-11-03 – 2021-11-04 (×3): 6 via TOPICAL

## 2021-11-02 NOTE — Progress Notes (Signed)
°  1900: Bowel program initiated by Danae Chen Lpn on day shift, patient tolerated well.   2110: small soft BM occurred and Pt states she feels like the bowel program helped. Education provided on the bowel program at this time.  22:00 In and out cath 500 ml returned. Pt tolerated well  0200: Pt repositioned and turned. Personal possessions are in place.  Personal care completed brief soiled and changed.   0330: In and Out Cath completed without distress.
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
## 2021-11-02 NOTE — Progress Notes (Signed)
Inpatient Rehabilitation Discharge Medication Review by a Pharmacist  A complete drug regimen review was completed for this patient to identify any potential clinically significant medication issues.  High Risk Drug Classes Is patient taking? Indication by Medication  Antipsychotic No   Anticoagulant Yes Lovenox for VTE ppx  Antibiotic Yes IV cefepime/daptomycin until 11/22/21 for abscess and osteo  Opioid Yes Percocet prn for pain  Antiplatelet No   Hypoglycemics/insulin Yes SSI and Ozempic for DM  Vasoactive Medication No   Chemotherapy No   Other Yes Biktarvy for HIV Gabapentin for pain Myrbetriq for bladder Baclofen for spasticity     Type of Medication Issue Identified Description of Issue Recommendation(s)  Drug Interaction(s) (clinically significant)     Duplicate Therapy     Allergy     No Medication Administration End Date     Incorrect Dose     Additional Drug Therapy Needed     Significant med changes from prior encounter (inform family/care partners about these prior to discharge).    Other       Clinically significant medication issues were identified that warrant physician communication and completion of prescribed/recommended actions by midnight of the next day:  No  Pharmacist comments: Discharging back to inpatient for surgery  Time spent performing this drug regimen review (minutes):  20 minutes   Tad Moore 11/02/2021 10:38 AM

## 2021-11-02 NOTE — Progress Notes (Signed)
PROGRESS NOTE   Subjective/Complaints:  Pt reports doing well; Bowels OK- changed to full liquid diet so won't get constipated/ileus after surgery.  Said sacrum has healed per staff- nurse will double check.   Asking about plan- how surgery done- discussed about a fusion, however need to get details from Dr Arnoldo Morale.    ROS:  Pt denies SOB, abd pain, CP, N/V/C/D, and vision changes   Objective:   CT THORACIC SPINE WO CONTRAST  Result Date: 11/02/2021 CLINICAL DATA:  Thoracic osteomyelitis EXAM: CT THORACIC SPINE WITHOUT CONTRAST TECHNIQUE: Multidetector CT images of the thoracic were obtained using the standard protocol without intravenous contrast. RADIATION DOSE REDUCTION: This exam was performed according to the departmental dose-optimization program which includes automated exposure control, adjustment of the mA and/or kV according to patient size and/or use of iterative reconstruction technique. COMPARISON:  MRI and CT thoracic spine 09/24/2021 FINDINGS: Alignment: Physiologic Vertebrae: Advanced endplate changes at U0-4 and T10-11 are unchanged since 09/24/2021. Expanded area of soft tissue at the level of the T10-12 levels with associated heterotopic ossification is also unchanged. There is no new site of erosive change. Paraspinal and other soft tissues: There are small bilateral pleural effusions. Disc levels: There is moderate-to-severe spinal canal stenosis at T5-6 and T10-11. IMPRESSION: 1. Unchanged appearance of advanced mid and lower thoracic findings of discitis-osteomyelitis with associated large phlegmon at the T10-12 level. 2. 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 3. Small bilateral pleural effusions. Electronically Signed   By: Ulyses Jarred M.D.   On: 11/02/2021 02:35   Recent Labs    11/02/21 0359  WBC 5.1  HGB 9.6*  HCT 29.2*  PLT 246     Recent Labs    11/02/21 0359  NA 136  K 4.2   CL 106  CO2 26  GLUCOSE 107*  BUN 18  CREATININE 0.44  CALCIUM 9.1      Intake/Output Summary (Last 24 hours) at 11/02/2021 0845 Last data filed at 11/02/2021 5409 Gross per 24 hour  Intake 1976.31 ml  Output 3100 ml  Net -1123.69 ml         Physical Exam: Vital Signs Blood pressure 95/73, pulse 85, temperature 98.6 F (37 C), temperature source Oral, resp. rate 18, height '5\' 2"'  (1.575 m), weight 69.9 kg, last menstrual period 01/08/2015, SpO2 100 %.    General: awake, alert, appropriate, laying on R side in bed; NAD HENT: conjugate gaze; oropharynx moist CV: regular rate; no JVD Pulmonary: CTA B/L; no W/R/R- good air movement GI: soft, NT, ND, (+)BS- normoactive Psychiatric: appropriate Neurological: Ox3  Extremities: No clubbing, cyanosis, or edema. Pulses are 2+- at rest, muscle atrophy/no Edema seen Skin: back incision cdi ; Coccyx- a little small scabbed over ulcer- looks unstageable, but only partial thickness- very superficial- dime sized, 0.1 cm X 0.2 cm Neuro:  Alert and oriented x 3. Normal insight and awareness. Intact Memory. Normal language and speech. Cranial nerve exam unremarkable. ~T3 sensory level without sensation below level of injury. Stocking glove sensory loss in hands. Motor 0/5 LE, 4/5 in UE with some pain inhibition, ?peripheral weakness in HI's. DTR's 1+, no resting tone Musculoskeletal: mid back tenderness  Atrophy of LE's.   Assessment/Plan: 1. Functional deficits which require 3+ hours per day of interdisciplinary therapy in a comprehensive inpatient rehab setting. Physiatrist is providing close team supervision and 24 hour management of active medical problems listed below. Physiatrist and rehab team continue to assess barriers to discharge/monitor patient progress toward functional and medical goals  Care Tool:  Bathing    Body parts bathed by patient: Right arm, Left arm, Chest, Abdomen, Face, Front perineal area, Right upper leg,  Left upper leg, Buttocks, Right lower leg, Left lower leg   Body parts bathed by helper: Right lower leg, Left lower leg     Bathing assist Assist Level: Minimal Assistance - Patient > 75%     Upper Body Dressing/Undressing Upper body dressing   What is the patient wearing?: Pull over shirt, Orthosis    Upper body assist Assist Level: Maximal Assistance - Patient 25 - 49%    Lower Body Dressing/Undressing Lower body dressing      What is the patient wearing?: Incontinence brief, Pants     Lower body assist Assist for lower body dressing: Maximal Assistance - Patient 25 - 49%     Toileting Toileting    Toileting assist Assist for toileting: Total Assistance - Patient < 25%     Transfers Chair/bed transfer  Transfers assist     Chair/bed transfer assist level: Moderate Assistance - Patient 50 - 74%     Locomotion Ambulation   Ambulation assist   Ambulation activity did not occur: N/A          Walk 10 feet activity   Assist  Walk 10 feet activity did not occur: N/A        Walk 50 feet activity   Assist Walk 50 feet with 2 turns activity did not occur: N/A         Walk 150 feet activity   Assist Walk 150 feet activity did not occur: N/A         Walk 10 feet on uneven surface  activity   Assist Walk 10 feet on uneven surfaces activity did not occur: N/A         Wheelchair     Assist Is the patient using a wheelchair?: Yes Type of Wheelchair: Manual    Wheelchair assist level: Independent Max wheelchair distance: 150'    Wheelchair 50 feet with 2 turns activity    Assist        Assist Level: Independent   Wheelchair 150 feet activity     Assist      Assist Level: Independent   Blood pressure 95/73, pulse 85, temperature 98.6 F (37 C), temperature source Oral, resp. rate 18, height '5\' 2"'  (1.575 m), weight 69.9 kg, last menstrual period 01/08/2015, SpO2 100 %.  Medical Problem List and Plan: 1.  Functional deficits secondary to thoracolumbar epidural abscess s/p T10-L3 laminotomy and decompression 09/28/21 in setting of T3 complete paraplegia             -patient may shower             -ELOS/Goals: 10-12 days, sup-min assist goals at w/c level  D/c 11/04/21- awaiting second stage thoracic spine surgery, with Dr. Arnoldo Morale.  Will be discharged straight to neurosurgery Con't CIR- PT and OT- d/c to acute for surgery 1/18 Continue CIR- PT and OT 2.  Impaired mobility: Continue Lovenox             -antiplatelet therapy: N/a 3. Pain: Continue oxycodone prn  for pain.  1/6- Pain controlle-d rarely taking meds- con't regimen 4. Mood: LCSW to follow for evaluation and support.              -antipsychotic agents:  5. Neuropsych: This patient is capable of making decisions on her own behalf. 6. 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  --Continue ensure max and Prosource for wound healing.   12/30- pt doing a good job staying off backside when not in therapy.  1/2- will need to go home on Air mattress until Stage III healed completely. 1/4- since going back to acute before d/c, cannot set up prior- but will inform acute hospital with d/c.   1/6- wound healing so might not need air mattress at home- however pt said MIGHT have air mattress- doesn't remember- will check with nephew.  1/16- pt reports wound on sacrum is healed- will check into this? 7. Fluids/Electrolytes/Nutrition: encouraged PO.  1/2- labs look great- con't to monitor 8.  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 Adjusted medication timing so she receives at 7am rather than during her 8am therapy session.              12/26- will order CRP/ESR, CBC with diff and CMP weekly 1/4- ESR 42- stable.  1/16- ESR 75- up from 42- will have PA call ID to make sure no changes required- to go for surgery  Wednesday.   9. T2DM: Well controlled with A1C-6.0. Was on Ozempic PTA.  --CBG (last 3)  Recent Labs    11/01/21 1651 11/01/21 2056 11/02/21 0559  GLUCAP 120* 113* 97   1/7- Needs to still have Ozempic brought in- con't regimen fo rnow 1/16- will con't regimen- stable 10. HIV: Stable and undetectable viral load on Biktarvy 11. 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 OSA: Does not use CPAP 13. Morbid obesity s/p gastric bypass: Will resume multivitamin BID for supplement.   1/3- BMI- down to 30  1/16- Bmi down to 28.17- con't to monitor 14.  Stage III sacral decub: appear to me more like a stage II, now.  Photo on file is 12/8 and appears to be Stage III. What I see now looks much better.  Will continue air mattress for pressure relief measures             -- vitamin C and Zinc to promote wound healing.              --consult WOC for input.  1/4- is healing- now has scabbed over- con't to monitor/con't air mattress  1/16- will see if still there 15. Neurogenic bowel: will resume scheduled po dulcolax in AM and dulcolax supp in PM similar to what she does at home.   Freq BMs, 4x yesterday , likely looser related to abx, also used less pain meds yesterday, will monitor prior to changes   1/16- having more Bms/loose because doing full liquid diet-  16. Neurogenic bladder:             I/O cath schedule 4-5 x daily  12/27- going well except overflow- see #18  12/30- doing better with caths q4 hours 17. Spasticity  12/26- will increase Baclofen to 10 mg TID_ might need more.   12/27- will increase Baclofen if need be, on Thursday 12/29.   12/29- spasms much better controlled- con't 10 mg  TID  1/5- Spasms doing much better not resolved, but doesn't impair function.  18. Bladder spasms/urinary overflow/leakage  12/26- will order Myrbetriq instead of Oxybutynin to help with bladder spasms.   12/27- no side effects- con't regimen  12/29-  will increase caths to q4 hours- if this isn't enough, then will increase Myrbetriq for overflow leakage, but her volumes also very high.   12/30- volumes ~400cc- doing much better on q4 hours- will con't.   1/3- will increase Myrbetriq to 50 mg daily-   1/4- no side effects; no results yet with increase  1/5- feels like having less leakage- con't Myrbetriq 1/11- pt states having some higher volumes at times if she goes too long between caths   1/4- per  Dr Arnoldo Morale- he agrees no self caths/Bowel program until surgery, however can do after surgery no immediate post op restrictions .  20. History of abnormal breast changes on cancer screening: discussed that she should follow-up with her outpatient provider on discharge if she notes changes in size of right breast lump.      I spent a total of 36 minutes on total care- >50% coordination of care- d/w PA/ID about plan.   LOS: 23 days A FACE TO FACE EVALUATION WAS PERFORMED  Ellen Lane 11/02/2021, 8:45 AM

## 2021-11-02 NOTE — Progress Notes (Signed)
Occupational Therapy Session Note  Patient Details  Name: Ellen Lane MRN: [id number] Date of Birth: 02-08-1966  Today's Date: 11/02/2021 OT Individual Time: 7893-8101 OT Individual Time Calculation (min): 59 min    Short Term Goals: Week 2:  OT Short Term Goal 1 (Week 2): Pt will perform slide board transfers bed <> wheelchair with Mod A of 1 in prep for IADL/ADL tasks OT Short Term Goal 1 - Progress (Week 2): Progressing toward goal OT Short Term Goal 2 (Week 2): Pt will use toilet aide with Mod A to wash/dry buttocks OT Short Term Goal 2 - Progress (Week 2): Met OT Short Term Goal 3 (Week 2): Pt will tolerate sitting EOM/EOB for 5 mins with no more than CGA OT Short Term Goal 3 - Progress (Week 2): Progressing toward goal Week 3:  OT Short Term Goal 1 (Week 3): STGs = LTGs 2/2 LOS   Skilled Therapeutic Interventions/Progress Updates:    Pt greeted at time of session semireclined in bed resting agreeable to OT session, no pain and RN starting IV abx. Pt family not present for morning session, discussed with pt how she can direct her care very well and she feels comfortable doing so with family/aide at home without formal family ed and training. Pt directed her care throughout session in simulated manor as if instructing an aide/caregiver at home. Pt performing bed level bathing and dressing rolling with Min A only to manage BLEs within precautions. Mod A for LB bathe, total A LB dress, Mod A UB dress, and therapist assist to don brace while rolling with pt able to fasten in the front. Note pt able to direct care throughout session including managing items, BLEs, bracing, etc. Pt left bed level call bell in reach and all needs met.    Therapy Documentation Precautions:  Precautions Precautions: Fall, Back Precaution Comments: Brace donned if HOB >30; Don brace in SUPINE with abdominal binder on first to control excess skin around trunk. Required Braces or Orthoses: Spinal  Brace Spinal Brace: Thoracolumbosacral orthotic, Applied in supine position Restrictions Weight Bearing Restrictions: No     Therapy/Group: Individual Therapy  [name] 11/02/2021, 7:17 AM

## 2021-11-02 NOTE — Progress Notes (Signed)
Pt transported to CT at 0145 for CT scan (Thoracic). Pt tolerated the procedure well. Pt is alert and oriented resp even and unlabored and Pt denies any acute distress. PT is back in her room resting turned from the left to ride side for repositioning. RN will continue to monitor for new distress or changes from baseline.

## 2021-11-02 NOTE — Progress Notes (Signed)
Pt was able to perform self catheterizations this shift with set up assistance.  Sheela Stack, LPN
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
## 2021-11-02 NOTE — Progress Notes (Signed)
Bowel program initiated, dig stim performed, small stool returned. Suppository inserted. Pt instructed to call for bathroom needs. Handoff given to night nurse. Sheela Stack, LPN

## 2021-11-02 NOTE — Progress Notes (Signed)
Mother here for nursing education. Educated family on placing the TLSO brace for pt. Pt is able to take care of the front aspect of the brace. Family educated on placing it from the back. Family has no further questions.  Sheela Stack, LPN

## 2021-11-02 NOTE — Progress Notes (Signed)
Back incision intact with sutures in place with small area of dehiscence at lower aspect. New drainage reported by nursing. Area expressed and drainage sent for culture. Small dime size area of breakdown on bony coccyx noted and being covered with foam dressing. Gerhardt's cream recommended for protection in addition to continuation of air mattress.

## 2021-11-02 NOTE — Progress Notes (Signed)
Physical Therapy Session Note  Patient Details  Name: Ellen Lane MRN: [id number] Date of Birth: 05/07/1966  Today's Date: 11/02/2021 PT Individual Time: 1000-1100; 1545-1710 PT Individual Time Calculation (min): 60 min and 85 min  Short Term Goals: Week 3:  PT Short Term Goal 1 (Week 3): =LTG due to ELOS  Skilled Therapeutic Interventions/Progress Updates:    Session 1: Pt received supine in bed, agreeable to PT session. No complaints of pain. Pt's mother not present initially at beginning of therapy session due to pt's understanding that family education from 11-12 instead of 8-12 with therapy and with nursing. Rolling L/R with min A with use of bedrail. Supine to sit with max A for BLE management and trunk elevation due to back precautions. Slide board transfers at min A level during session with pt able to direct therapist on how to safely setup and assist her with transfer. Pt's mother arrives at end of session. Pt able to direct her mother on setting up transfer safely and how to assist her with transfer. Pt's mom able to assist pt with transfer w/c to bed. Sit to supine mod A for BLE management. Pt and her mother with no further questions prior to d/c. Pt does a great job self-directing her care and directing caregivers on how to assist her. Pt left supine in bed with needs in reach, mother present for hands-on training session with nursing.  Session 2: Pt received sidelying in bed, agreeable to PT session. No complaints of pain. Supine to sit with max A for BLE management and trunk elevation. Slide board transfers at min A level throughout session. PWC mobility at mod I level. Session focus on sitting balance EOM performing ball toss 3 x 25 reps and ball pass around body 2 x 15 reps with CGA to min A for dynamic sitting balance. Pt then found to be incontinent of BM, SB transfer back to [facility] then back to bed with min A. Sit to supine mod A for BLE management. Rolling L/R with min A  during pericare and brief change. Pt left in care of nursing and PA at end of session for inspection of back incision.  Therapy Documentation Precautions:  Precautions Precautions: Fall, Back Precaution Comments: Brace donned if HOB >30; Don brace in SUPINE with abdominal binder on first to control excess skin around trunk. Required Braces or Orthoses: Spinal Brace Spinal Brace: Thoracolumbosacral orthotic, Applied in supine position Restrictions Weight Bearing Restrictions: No     Therapy/Group: Individual Therapy   Excell Seltzer, PT, DPT, CSRS  11/02/2021, 12:27 PM

## 2021-11-02 NOTE — Progress Notes (Signed)
Subjective: The patient is alert and pleasant.  She is in no apparent distress.  Objective: Vital signs in last 24 hours: Temp:  [97.6 F (36.4 C)-99 F (37.2 C)] 99 F (37.2 C) (01/16 1245) Pulse Rate:  [79-92] 92 (01/16 1245) Resp:  [17-18] 17 (01/16 1245) BP: (95-125)/(73-76) 125/76 (01/16 1245) SpO2:  [100 %] 100 % (01/16 1245) Estimated body mass index is 28.17 kg/m as calculated from the following:   Height as of this encounter: 5\' 2"  (1.575 m).   Weight as of this encounter: 69.9 kg.   Intake/Output from previous day: 01/15 0701 - 01/16 0700 In: 1616.3 [P.O.:560; IV Piggyback:1056.3] Out: 3100 [Urine:3100] Intake/Output this shift: Total I/O In: 1800 [P.O.:600; I.V.:1100; IV Piggyback:100] Out: 650 [Urine:650]  Physical exam the patient is alert and pleasant.  She is a complete paraplegic.  I have reviewed the patient's thoracic CT performed today.  It demonstrates destruction of her T10 and T11 vertebral bodies.  Without significant change from her previous CT.  Lab Results: Recent Labs    11/02/21 0359  WBC 5.1  HGB 9.6*  HCT 29.2*  PLT 246   BMET Recent Labs    11/02/21 0359  NA 136  K 4.2  CL 106  CO2 26  GLUCOSE 107*  BUN 18  CREATININE 0.44  CALCIUM 9.1    Studies/Results: CT THORACIC SPINE WO CONTRAST  Result Date: 11/02/2021 CLINICAL DATA:  Thoracic osteomyelitis EXAM: CT THORACIC SPINE WITHOUT CONTRAST TECHNIQUE: Multidetector CT images of the thoracic were obtained using the standard protocol without intravenous contrast. RADIATION DOSE REDUCTION: This exam was performed according to the departmental dose-optimization program which includes automated exposure control, adjustment of the mA and/or kV according to patient size and/or use of iterative reconstruction technique. COMPARISON:  MRI and CT thoracic spine 09/24/2021 FINDINGS: Alignment: Physiologic Vertebrae: Advanced endplate changes at Y1-0 and T10-11 are unchanged since 09/24/2021.  Expanded area of soft tissue at the level of the T10-12 levels with associated heterotopic ossification is also unchanged. There is no new site of erosive change. Paraspinal and other soft tissues: There are small bilateral pleural effusions. Disc levels: There is moderate-to-severe spinal canal stenosis at T5-6 and T10-11. IMPRESSION: 1. Unchanged appearance of advanced mid and lower thoracic findings of discitis-osteomyelitis with associated large phlegmon at the T10-12 level. 2. 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 3. Small bilateral pleural effusions. Electronically Signed   By: Ulyses Jarred M.D.   On: 11/02/2021 02:35    Assessment/Plan: T10, T11 osteomyelitis, instability, chronic paraplegia: I have discussed the situation with the patient.  We have again discussed that this is likely from an infection which has been empirically treated with antibiotics as the cultures were negative.  Another possibility is Charcot joint.  Either way she has thoracic instability.  I am concerned that she could injure her great vessels without stabilization.  I recommended we proceed with a thoracic instrumentation and fusion to provide thoracic stability so she can get on with her life.  I have described that surgery.  We have discussed the risk of surgery including risk of infection, instrumentation malplacement malfunction, fusion failure, medical risk, etc.  She understands that this will not affect her chronic complete paraplegia.  We have also discussed the alternative of prolonged bedrest or chronic TLSO.  We discussed the expected postoperative course and the likelihood of achieving our goals with surgery.  I have answered all her questions.  She wants to proceed with surgery as scheduled on  11/04/2021.  LOS: 23 days     Ellen Lane 11/02/2021, 5:27 PM     Patient ID: Ellen Lane, female   DOB: [birth year], 56 y.o.   MRN: [id number]

## 2021-11-03 ENCOUNTER — Other Ambulatory Visit (HOSPITAL_COMMUNITY): Payer: Self-pay

## 2021-11-03 DIAGNOSIS — K592 Neurogenic bowel, not elsewhere classified: Secondary | ICD-10-CM

## 2021-11-03 DIAGNOSIS — R252 Cramp and spasm: Secondary | ICD-10-CM

## 2021-11-03 LAB — GLUCOSE, CAPILLARY
Glucose-Capillary: 93 mg/dL (ref 70–99)
Glucose-Capillary: 133 mg/dL — ABNORMAL HIGH (ref 70–99)
Glucose-Capillary: 81 mg/dL (ref 70–99)
Glucose-Capillary: 91 mg/dL (ref 70–99)

## 2021-11-03 LAB — ABO/RH: ABO/RH(D): O POS

## 2021-11-03 MED ORDER — POLYETHYLENE GLYCOL 3350 17 GM/SCOOP PO POWD
17.0000 g | Freq: Every day | ORAL | 0 refills | Status: AC | PRN
Start: 2021-11-03 — End: ?
  Filled 2021-11-03: qty 238, 14d supply, fill #0

## 2021-11-03 MED ORDER — SENNOSIDES-DOCUSATE SODIUM 8.6-50 MG PO TABS
2.0000 | ORAL_TABLET | Freq: Every day | ORAL | Status: AC
Start: 2021-11-03 — End: ?

## 2021-11-03 MED ORDER — GERHARDT'S BUTT CREAM
TOPICAL_CREAM | Freq: Two times a day (BID) | CUTANEOUS | Status: DC
  Filled 2021-11-03: qty 1

## 2021-11-03 MED ORDER — ASCORBIC ACID 500 MG PO TABS
500.0000 mg | ORAL_TABLET | Freq: Two times a day (BID) | ORAL | Status: DC
Start: 2021-11-03 — End: 2023-03-04

## 2021-11-03 MED ORDER — OXYCODONE-ACETAMINOPHEN 5-325 MG PO TABS: 1.0000 | ORAL_TABLET | ORAL | 0 refills | Status: DC | PRN

## 2021-11-03 MED ORDER — DAPTOMYCIN IV (FOR PTA / DISCHARGE USE ONLY): 600.0000 mg | INTRAVENOUS | 0 refills | Status: DC

## 2021-11-03 MED ORDER — ZINC SULFATE 220 (50 ZN) MG PO CAPS: 220.0000 mg | ORAL_CAPSULE | Freq: Every day | ORAL | Status: DC

## 2021-11-03 MED ORDER — CEFEPIME IV (FOR PTA / DISCHARGE USE ONLY): 2.0000 g | Freq: Three times a day (TID) | INTRAVENOUS | 0 refills | Status: DC

## 2021-11-03 MED ORDER — MIRABEGRON ER 50 MG PO TB24: 50.0000 mg | ORAL_TABLET | Freq: Every day | ORAL | Status: DC

## 2021-11-03 MED ORDER — INSULIN ASPART 100 UNIT/ML IJ SOLN
0.0000 [IU] | Freq: Three times a day (TID) | INTRAMUSCULAR | 11 refills | Status: DC
Start: 2021-11-03 — End: 2024-05-18

## 2021-11-03 MED ORDER — POLYETHYLENE GLYCOL 3350 17 G PO PACK
17.0000 g | PACK | Freq: Every day | ORAL | 0 refills | Status: DC
  Filled 2021-11-03: qty 14, 14d supply, fill #0

## 2021-11-03 MED ORDER — ACETAMINOPHEN 325 MG PO TABS: 325.0000 mg | ORAL_TABLET | ORAL | Status: DC | PRN

## 2021-11-03 MED ORDER — BISACODYL 10 MG RE SUPP
10.0000 mg | Freq: Every day | RECTAL | 0 refills | Status: AC
Start: 2021-11-03 — End: ?

## 2021-11-03 NOTE — Patient Care Conference (Signed)
Inpatient RehabilitationTeam Conference and Plan of Care Update Date: 11/03/2021   Time: 11:11 AM    Patient Name: Ellen Lane      Medical Record Number: [id number]  Date of Birth: 09-27-1966 Sex: Female         Room/Bed: 4W14C/4W14C-01 Payor Info: Payor: [employer] MEDICARE / Plan: [facility] MEDICARE / Product Type: *No Product type* /    Admit Date/Time:  10/10/2021 10:44 PM  Primary Diagnosis:  Abscess of paraspinal muscles  Hospital Problems: Principal Problem:   Abscess of paraspinal muscles Active Problems:   Epidural abscess   Vertebral osteomyelitis [facility])    Expected Discharge Date: Expected Discharge Date: 11/04/21 (to acute)  Team Members Present: Physician leading conference: Dr. Courtney Heys Social Worker Present: [name], [place] Nurse Present: Dorien Chihuahua, RN PT Present: Excell Seltzer, PT OT Present: Roanna Epley, [place], OT SLP Present: Charolett Bumpers, SLP PPS Coordinator present : Gunnar Fusi, SLP     Current Status/Progress Goal Weekly Team Focus  Bowel/Bladder   I/O cath q4h- or per pt request. Pt remain incont of both B/B  Continue cathing schedule.  Continue Bowel prgram and in/out cath schedule   Swallow/Nutrition/ Hydration             ADL's   bathing/dressing at bed level-mod A; SB transfers-mod A; sitting balance-min A  bathing-min A (w/c level and/or bed level); LB dressing-mod A; UB dressing-supervision (excluding orthosis)  activity tolerance, safety awareness, tranfsers   Mobility   min A rolling, mod to max A supine to/from sit, min A SB transfer, mod I PWC mobility  min A overall for transfers, bed mobility, etc; mod I PWC mobility  d/c planning, family edu, transfers, sitting balance   Communication             Safety/Cognition/ Behavioral Observations            Pain   Pt c/o of back pain, PRN meds effective.  remain pain < 3  Assess pain qshift   Skin   back incision with daily dressing change,  pressure injury to sacrum with foam in place  continue healing on current skin status and remain free of new breakdown  Assess skinqshift/prn     Discharge Planning:  Patient will d/c to acute services for procedure on 1/18 and will not return. Pt mother to assist her with care needs once home. Pt will have HHPT/OT/SN with [facility] and IV abx with [facility]. Referral for increase in hours faxed to [employer] since pt has PCS in place with TCD care-M-W 3.5hrs per day and TR/F 3hrs per day. CAP/DA referral submitted with [facility] CAP/DA program.   Team Discussion: Patient changed to full liquid diet in prep for OR tomorrow. Pain is controlled. Dehiscence monitored Patient on target to meet rehab goals: yes, currently needs min assist for transfers.  *See Care Plan and progress notes for long and short-term goals.   Revisions to Treatment Plan:  OR planned for 11/04/2021   Teaching Needs: Family education completed 11/02/2021; safety, medication management, skin care, transfers, toileting, etc.  Current Barriers to Discharge: Decreased caregiver support  Possible Resolutions to Barriers: Family education completed HH follow up PT, OT and nursing services recommended DME: Slide-board     Medical Summary Current Status: set up i/o caths- stage III healing-  Barriers to Discharge: Decreased family/caregiver support;Home enviroment access/layout;Other (comments);Neurogenic Bowel & Bladder;IV antibiotics;Medical stability;Pending Surgery;Weight bearing restrictions;Wound care  Barriers to Discharge Comments:  T3 paraplegia- will order a sliding board for her- doesn't want a hospital bed. Possible Resolutions to [employer] Focus: d/c to acute hospital- 1/18- ready from PT/OT perspective. Mother did some family education; I directing her own care.   Continued Need for Acute Rehabilitation Level of Care: The patient requires daily medical management by a  physician with specialized training in physical medicine and rehabilitation for the following reasons: Direction of a multidisciplinary physical rehabilitation program to maximize functional independence : Yes Medical management of patient stability for increased activity during participation in an intensive rehabilitation regime.: Yes Analysis of laboratory values and/or radiology reports with any subsequent need for medication adjustment and/or medical intervention. : Yes   I attest that I was present, lead the team conference, and concur with the assessment and plan of the team.   Dorien Chihuahua B 11/03/2021, 2:26 PM

## 2021-11-03 NOTE — Progress Notes (Signed)
Occupational Therapy Discharge Summary  Patient Details  Name: Ellen Lane MRN: [id number] Date of Birth: [birth year]    Patient has met 8 of 8 long term goals due to improved activity tolerance, improved balance, ability to compensate for deficits, and improved coordination.  Patient to discharge at overall Mod Assist level.  Patient's care partner is independent to provide the necessary physical assistance at discharge.  Pt will have assist from family (mother and siblings) with an aide to assist with ADLs/IADLs with potentially increasing aide frequency. Pt is Mod A with UB dress (excluding TLSO), LB bathing, Min/CGA for rolling bed level, and pt is able to direct her care very well. Pt has been trained on use of adaptive equipment including LHS, toileting aide, and other techniques. Pt is Min A with slide board transfers and family members completed family ed with PT but were unable to be present for OT education.   Reasons goals not met: NA  Recommendation:  Patient will benefit from ongoing skilled OT services in home health setting to continue to advance functional skills in the area of BADL, iADL, and Reduce care partner burden.  Equipment: No equipment provided pt already has power chair, no other needs  Reasons for discharge: treatment goals met and discharge from hospital and pt to return to acute for further surgery  Patient/family agrees with progress made and goals achieved: Yes  OT Discharge ADL ADL Eating: Independent Grooming: Setup Upper Body Bathing: Setup Lower Body Bathing: Maximal assistance Upper Body Dressing: Moderate assistance Lower Body Dressing: Maximal assistance Toileting: Maximal assistance (bed level, brief change, tongs for hygiene) Where Assessed-Toileting: Bed level Vision Baseline Vision/History: 0 No visual deficits Patient Visual Report: No change from baseline Vision Assessment?: No apparent visual deficits Perception  Perception:  Within Functional Limits Praxis Praxis: Intact Cognition Overall Cognitive Status: Within Functional Limits for tasks assessed Arousal/Alertness: Awake/alert Orientation Level: Oriented X4 Year: 2023 Month: January Day of Week: Correct Memory: Appears intact Immediate Memory Recall: Sock;Blue;Bed Memory Recall Sock: Without Cue Memory Recall Blue: Without Cue Memory Recall Bed: Without Cue Problem Solving: Appears intact Safety/Judgment: Appears intact Sensation Sensation Light Touch: Impaired Detail Light Touch Impaired Details: Absent RLE;Absent LLE Additional Comments: T2 paraplegia Coordination Gross Motor Movements are Fluid and Coordinated: No Fine Motor Movements are Fluid and Coordinated: Yes Coordination and Movement Description: impaired 2/2 old T2 SCI Pharmacist, community Motor: Paraplegia;Abnormal tone;Abnormal postural alignment and control Motor - Skilled Clinical Observations: T2 complete paraplegia Mobility  Bed Mobility Bed Mobility: Rolling Right;Rolling Left Rolling Right: Minimal Assistance - Patient > 75% Rolling Left: Minimal Assistance - Patient > 75% Supine to Sit: Maximal Assistance - Patient - Patient 25-49% Sit to Supine: Moderate Assistance - Patient 50-74%  Trunk/Postural Assessment  Cervical Assessment Cervical Assessment: Within Functional Limits Thoracic Assessment Thoracic Assessment: Exceptions to [facility] Lumbar Assessment Lumbar Assessment: Exceptions to [facility] Postural Control Postural Control: Deficits on evaluation Trunk Control: impaired 2/2 T2 paraplegia and TLSO  Balance Balance Balance Assessed: Yes Dynamic Sitting Balance Dynamic Sitting - Balance Support: Feet supported;During functional activity;Bilateral upper extremity supported Dynamic Sitting - Level of Assistance: 4: Min assist   Extremity/Trunk Assessment RUE Assessment RUE Assessment: Within Functional Limits General Strength Comments: RUE functional for ADL and bed mobility  tasks LUE Assessment LUE Assessment: Within Functional Limits General Strength Comments: LUE functional for ADL and bed mobility tasks   Ellen Lane 11/03/2021, 12:48 PM

## 2021-11-03 NOTE — Plan of Care (Signed)
Incontinent of bowel with bowel program at discharge

## 2021-11-03 NOTE — Progress Notes (Signed)
Occupational Therapy Session Note  Patient Details  Name: Ellen Lane MRN: [id number] Date of Birth: January 02, 1966  Today's Date: 11/03/2021 OT Individual Time: 0903-0149 OT Individual Time Calculation (min): 58 min    Short Term Goals: Week 2:  OT Short Term Goal 1 (Week 2): Pt will perform slide board transfers bed <> wheelchair with Mod A of 1 in prep for IADL/ADL tasks OT Short Term Goal 1 - Progress (Week 2): Progressing toward goal OT Short Term Goal 2 (Week 2): Pt will use toilet aide with Mod A to wash/dry buttocks OT Short Term Goal 2 - Progress (Week 2): Met OT Short Term Goal 3 (Week 2): Pt will tolerate sitting EOM/EOB for 5 mins with no more than CGA OT Short Term Goal 3 - Progress (Week 2): Progressing toward goal Week 3:  OT Short Term Goal 1 (Week 3): STGs = LTGs 2/2 LOS   Skilled Therapeutic Interventions/Progress Updates:    Pt greeted at time of session semireclined bed level with nursing hooking up PICC for IV abx. No reports of pain, wanting to perform morning ADL session. Pt performing bed level bathing and dressing with set up of water, washcloths, towels, soap, etc. OT assisting today 2/2 time constraints to assist with LB bathe/dress with pt able to direct her care throughout session. Pt with incontinent episode of BM during session in sidelying, extended amount of time required for hygiene and linen change. Pt rolling throughout ADL session Min/CGA for LE management only. Max/total for brief change with pt assisting with straps on front of brief. Donning shirt, binder, and TLSO bed level <30* within precautions. Pt resting call bell in reach all needs met.   Therapy Documentation Precautions:  Precautions Precautions: Fall, Back Precaution Comments: Brace donned if HOB >30; Don brace in SUPINE with abdominal binder on first to control excess skin around trunk. Required Braces or Orthoses: Spinal Brace Spinal Brace: Thoracolumbosacral orthotic, Applied in  supine position Restrictions Weight Bearing Restrictions: No      Therapy/Group: Individual Therapy  [name] 11/03/2021, 7:23 AM

## 2021-11-03 NOTE — Progress Notes (Signed)
PROGRESS NOTE   Subjective/Complaints:  Pt wants to  be switched to full liquid diet- was done this AM. That was doesn't have bowel obstruction after surgery. Miscommunication yesterday that wants it changed- not that had bene changed.   Doesn't want hospital bed at d/c- wants her own queen sized bed so can move around better.  Also, will need a new sliding board- hers has a crack in it.   Went over basics of Dr Arnoldo Morale' discussion yesterday.   ROS:  Pt denies SOB, abd pain, CP, N/V/C/D, and vision changes   Objective:   CT THORACIC SPINE WO CONTRAST  Result Date: 11/02/2021 CLINICAL DATA:  Thoracic osteomyelitis EXAM: CT THORACIC SPINE WITHOUT CONTRAST TECHNIQUE: Multidetector CT images of the thoracic were obtained using the standard protocol without intravenous contrast. RADIATION DOSE REDUCTION: This exam was performed according to the departmental dose-optimization program which includes automated exposure control, adjustment of the mA and/or kV according to patient size and/or use of iterative reconstruction technique. COMPARISON:  MRI and CT thoracic spine 09/24/2021 FINDINGS: Alignment: Physiologic Vertebrae: Advanced endplate changes at G8-9 and T10-11 are unchanged since 09/24/2021. Expanded area of soft tissue at the level of the T10-12 levels with associated heterotopic ossification is also unchanged. There is no new site of erosive change. Paraspinal and other soft tissues: There are small bilateral pleural effusions. Disc levels: There is moderate-to-severe spinal canal stenosis at T5-6 and T10-11. IMPRESSION: 1. Unchanged appearance of advanced mid and lower thoracic findings of discitis-osteomyelitis with associated large phlegmon at the T10-12 level. 2. 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 3. Small bilateral pleural effusions. Electronically Signed   By: Ulyses Jarred M.D.   On: 11/02/2021 02:35    Recent Labs    11/02/21 0359  WBC 5.1  HGB 9.6*  HCT 29.2*  PLT 246     Recent Labs    11/02/21 0359  NA 136  K 4.2  CL 106  CO2 26  GLUCOSE 107*  BUN 18  CREATININE 0.44  CALCIUM 9.1      Intake/Output Summary (Last 24 hours) at 11/03/2021 1694 Last data filed at 11/03/2021 0700 Gross per 24 hour  Intake 2842 ml  Output 1301 ml  Net 1541 ml         Physical Exam: Vital Signs Blood pressure 102/66, pulse 72, temperature 97.6 F (36.4 C), temperature source Oral, resp. rate 17, height _0  (1.575 m), weight 69.9 kg, last menstrual period 01/08/2015, SpO2 100 %.     General: awake, alert, appropriate,  laying supine in bed; NAD HENT: conjugate gaze; oropharynx moist CV: regular rate; no JVD Pulmonary: CTA B/L; no W/R/R- good air movement GI: soft, NT, ND, (+)BS Psychiatric: appropriate- interactive Neurological: Ox3 Skin: dressing on spine- C/D/I Extremities: No clubbing, cyanosis, or edema. Pulses are 2+- at rest, muscle atrophy/no Edema seen Skin: back incision cdi ; Coccyx- a little small scabbed over ulcer- looks unstageable, but only partial thickness- very superficial- dime sized, 0.1 cm X 0.2 cm no change on coccyx wound Neuro:  Alert and oriented x 3. Normal insight and awareness. Intact Memory. Normal language and speech. Cranial nerve exam unremarkable. ~T3 sensory  level without sensation below level of injury. Stocking glove sensory loss in hands. Motor 0/5 LE, 4/5 in UE with some pain inhibition, ?peripheral weakness in HI's. DTR's 1+, no resting tone Musculoskeletal: mid back tenderness  Atrophy of LE's.   Assessment/Plan: 1. Functional deficits which require 3+ hours per day of interdisciplinary therapy in a comprehensive inpatient rehab setting. Physiatrist is providing close team supervision and 24 hour management of active medical problems listed below. Physiatrist and rehab team continue to assess barriers to discharge/monitor patient  progress toward functional and medical goals  Care Tool:  Bathing    Body parts bathed by patient: Right arm, Left arm, Chest, Abdomen, Face, Front perineal area, Right upper leg, Left upper leg, Buttocks, Right lower leg, Left lower leg   Body parts bathed by helper: Right lower leg, Left lower leg     Bathing assist Assist Level: Minimal Assistance - Patient > 75%     Upper Body Dressing/Undressing Upper body dressing   What is the patient wearing?: Pull over shirt, Orthosis    Upper body assist Assist Level: Maximal Assistance - Patient 25 - 49%    Lower Body Dressing/Undressing Lower body dressing      What is the patient wearing?: Incontinence brief, Pants     Lower body assist Assist for lower body dressing: Maximal Assistance - Patient 25 - 49%     Toileting Toileting    Toileting assist Assist for toileting: Total Assistance - Patient < 25%     Transfers Chair/bed transfer  Transfers assist     Chair/bed transfer assist level: Minimal Assistance - Patient > 75%     Locomotion Ambulation   Ambulation assist   Ambulation activity did not occur: N/A          Walk 10 feet activity   Assist  Walk 10 feet activity did not occur: N/A        Walk 50 feet activity   Assist Walk 50 feet with 2 turns activity did not occur: N/A         Walk 150 feet activity   Assist Walk 150 feet activity did not occur: N/A         Walk 10 feet on uneven surface  activity   Assist Walk 10 feet on uneven surfaces activity did not occur: N/A         Wheelchair     Assist Is the patient using a wheelchair?: Yes Type of Wheelchair: Power    Wheelchair assist level: Independent Max wheelchair distance: 150'    Wheelchair 50 feet with 2 turns activity    Assist        Assist Level: Independent   Wheelchair 150 feet activity     Assist      Assist Level: Independent   Blood pressure 102/66, pulse 72, temperature  97.6 F (36.4 C), temperature source Oral, resp. rate 17, height _0  (1.575 m), weight 69.9 kg, last menstrual period 01/08/2015, SpO2 100 %.  Medical Problem List and Plan: 1. Functional deficits secondary to thoracolumbar epidural abscess s/p T10-L3 laminotomy and decompression 09/28/21 in setting of T3 complete paraplegia             -patient may shower             -ELOS/Goals: 10-12 days, sup-min assist goals at w/c level  D/c 11/04/21- awaiting second stage thoracic spine surgery, with Dr. Arnoldo Morale.  Will be discharged straight to neurosurgery Con't CIR today- d/c to acute  tomorrow- team conference today to finalize d/c plans.  2.  Impaired mobility: Continue Lovenox             -antiplatelet therapy: N/a 3. Pain: Continue oxycodone prn for pain.  1/17- pain controlled- rarely takes pain meds- con't regimen prn.  4. Mood: LCSW to follow for evaluation and support.              -antipsychotic agents:  5. Neuropsych: This patient is capable of making decisions on her own behalf. 6. 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  --Continue ensure max and Prosource for wound healing.   12/30- pt doing a good job staying off backside when not in therapy.  1/2- will need to go home on Air mattress until Stage III healed completely. 1/4- since going back to acute before d/c, cannot set up prior- but will inform acute hospital with d/c.   1/6- wound healing so might not need air mattress at home- however pt said MIGHT have air mattress- doesn't remember- will check with nephew.  1/17- still has little area on coccyx- prominent coccyx as well-little dehiscence of back wound- surgery tomorrow   7. Fluids/Electrolytes/Nutrition: encouraged PO.  1/2- labs look great- con't to monitor 8.  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 Adjusted medication timing so she  receives at 7am rather than during her 8am therapy session.              12/26- will order CRP/ESR, CBC with diff and CMP weekly 1/4- ESR 42- stable.  1/16- ESR 75- up from 42- will have PA call ID to make sure no changes required- to go for surgery Wednesday.   1/17- ID wants new cultures during surgery this time 9. T2DM: Well controlled with A1C-6.0. Was on Ozempic PTA.  --CBG (last 3)  Recent Labs    11/02/21 1719 11/02/21 2139 11/03/21 0644  GLUCAP 103* 129* 93   1/7- Needs to still have Ozempic brought in- con't regimen fo rnow 1/16- will con't regimen- stable 10. HIV: Stable and undetectable viral load on Biktarvy 11. 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 OSA: Does not use CPAP 13. Morbid obesity s/p gastric bypass: Will resume multivitamin BID for supplement.   1/3- BMI- down to 30  1/16- Bmi down to 28.17- con't to monitor 14.  Stage III sacral decub: appear to me more like a stage II, now.  Photo on file is 12/8 and appears to be Stage III. What I see now looks much better.  Will continue air mattress for pressure relief measures             -- vitamin C and Zinc to promote wound healing.              --consult WOC for input.  1/4- is healing- now has scabbed over- con't to monitor/con't air mattress  1/17- still there- but smaller- con't air mattress and staying on side as much as possible.  15. Neurogenic bowel: will resume scheduled po dulcolax in AM and dulcolax supp in PM similar to what she does at home.   Freq BMs, 4x yesterday , likely looser related to abx, also used less pain meds yesterday, will monitor prior to changes   1/16- having more Bms/loose because doing full liquid diet-   1/17- changed diet ot clear liquid diet  per pt request. 16. Neurogenic bladder:             I/O cath schedule 4-5 x daily  12/27- going well except overflow- see #18  12/30- doing better with caths q4 hours 17. Spasticity  12/26-  will increase Baclofen to 10 mg TID_ might need more.   12/27- will increase Baclofen if need be, on Thursday 12/29.   12/29- spasms much better controlled- con't 10 mg TID  1/5- Spasms doing much better not resolved, but doesn't impair function.  18. Bladder spasms/urinary overflow/leakage  12/26- will order Myrbetriq instead of Oxybutynin to help with bladder spasms.   12/27- no side effects- con't regimen  12/29- will increase caths to q4 hours- if this isn't enough, then will increase Myrbetriq for overflow leakage, but her volumes also very high.   12/30- volumes ~400cc- doing much better on q4 hours- will con't.   1/3- will increase Myrbetriq to 50 mg daily-   1/4- no side effects; no results yet with increase  1/5- feels like having less leakage- con't Myrbetriq 1/11- pt states having some higher volumes at times if she goes too long between caths   1/4- per  Dr Arnoldo Morale- he agrees no self caths/Bowel program until surgery, however can do after surgery no immediate post op restrictions .  20. History of abnormal breast changes on cancer screening: discussed that she should follow-up with her outpatient provider on discharge if she notes changes in size of right breast lump.      I spent a total of 37 minutes on total visit- >50% coordination of care- team conference- going over ID and NSU notes and communicating with [facility].   NEED Cultures from surgery per ID  LOS: 24 days A FACE TO FACE EVALUATION WAS PERFORMED  Revel Stellmach 11/03/2021, 8:12 AM
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
## 2021-11-03 NOTE — Progress Notes (Signed)
Physical Therapy Discharge Summary  Patient Details  Name: Ellen Lane MRN: [id number] Date of Birth: [birth year]  Patient has met 7 of 8 long term goals due to improved activity tolerance, improved balance, improved postural control, and ability to compensate for deficits.  Patient to discharge at a wheelchair level [place] for transfers and mod I for power wheelchair mobility.   Patient's care partner is independent to provide the necessary physical assistance at discharge. Pt's mother has completed hands on family education and is safe to assist pt upon d/c home. Pt has also excelled at directing caregivers in how to assist her safely.  Reasons goals not met: Pt did not meet bed mobility goal of min A due to ongoing back precautions limiting her ability to maneuver BLE without assistance in order to adhere to precautions. Pt has met all goals adequately for a safe d/c home.  Recommendation:  Patient will benefit from ongoing skilled PT services in  acute care therapy setting following spinal surgery  to continue to advance safe functional mobility, address ongoing impairments in endurance, strength, balance, safety, independence with functional mobility, and minimize fall risk.  Equipment: 30" slide board  Reasons for discharge: treatment goals met and discharge to acute care following surgery  Patient/family agrees with progress made and goals achieved: Yes  PT Discharge Precautions/Restrictions Precautions Precautions: Fall;Back Precaution Comments: Brace donned if HOB >30; Don brace in [place] with abdominal binder on first to control excess skin around trunk. Required Braces or Orthoses: Spinal Brace Spinal Brace: Thoracolumbosacral orthotic;Applied in supine position Restrictions Weight Bearing Restrictions: No Pain Interference Pain Interference Pain Effect on Sleep: 1. Rarely or not at all Pain Interference with Therapy Activities: 1. Rarely or not at all Pain  Interference with Day-to-Day Activities: 1. Rarely or not at all Vision/Perception  Perception Perception: Within Functional Limits Praxis Praxis: Intact  Cognition Overall Cognitive Status: Within Functional Limits for tasks assessed Arousal/Alertness: Awake/alert Orientation Level: Oriented X4 Year: 2023 Month: January Day of Week: Correct Attention: Focused;Sustained Focused Attention: Appears intact Sustained Attention: Appears intact Memory: Appears intact Awareness: Appears intact Problem Solving: Appears intact Safety/Judgment: Appears intact Sensation Sensation Light Touch: Impaired Detail Light Touch Impaired Details: Absent RLE;Absent LLE Proprioception: Impaired Detail Proprioception Impaired Details: Absent RLE;Absent LLE Additional Comments: T2 paraplegia Coordination Gross Motor Movements are Fluid and Coordinated: No Fine Motor Movements are Fluid and Coordinated: Yes Coordination and Movement Description: impaired 2/2 old T2 SCI Heel Shin Test: unable to perform 2/2 T2 complete para Motor  Motor Motor: Paraplegia;Abnormal tone;Abnormal postural alignment and control Motor - Skilled Clinical Observations: T2 complete paraplegia Motor - Discharge Observations: T2 complete paraplegia  Mobility Bed Mobility Bed Mobility: Rolling Right;Rolling Left;Supine to Sit;Sit to Supine Rolling Right: Minimal Assistance - Patient > 75% Rolling Left: Minimal Assistance - Patient > 75% Supine to Sit: Maximal Assistance - Patient - Patient 25-49% Sit to Supine: Moderate Assistance - Patient 50-74% Transfers Transfers: Lateral/Scoot Transfers Lateral/Scoot Transfers: Minimal Assistance - Patient > 75% Transfer (Assistive device): Other (Comment) (slide board) Locomotion  Gait Ambulation: No Gait Gait: No Stairs / Additional Locomotion Stairs: No Pick up small object from the floor (from standing position) activity did not occur: N/A Product manager  Mobility: Yes Wheelchair Assistance: Independent with Camera operator: Power Wheelchair Parts Management: Independent Distance: 1000  Trunk/Postural Assessment  Cervical Assessment Cervical Assessment: Within Water engineer Thoracic Assessment: Exceptions to [facility] (rounded shoulders; T2 para; back precautions) Lumbar Assessment Lumbar Assessment: Exceptions  to [facility] (posterior pelvic tilt, back precautions) Postural Control Postural Control: Deficits on evaluation Trunk Control: impaired 2/2 T2 paraplegia and TLSO Righting Reactions: impaired/insufficient  Balance Balance Balance Assessed: Yes Static Sitting Balance Static Sitting - Balance Support: No upper extremity supported;Feet supported Static Sitting - Level of Assistance: 5: Stand by assistance Dynamic Sitting Balance Dynamic Sitting - Balance Support: Bilateral upper extremity supported;Feet supported;During functional activity Dynamic Sitting - Level of Assistance: 4: Min assist Extremity Assessment   RLE Assessment RLE Assessment: Exceptions to [facility] General Strength Comments: impaired 2/2 T2 paraplegia; 0/5 LLE Assessment LLE Assessment: Exceptions to [facility] General Strength Comments: impaired 2/2 T2 paraplegia; 0/5     Excell Seltzer, PT, DPT, CSRS 11/03/2021, 4:59 PM
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
## 2021-11-03 NOTE — Progress Notes (Signed)
Physical Therapy Session Note  Patient Details  Name: Ellen Lane MRN: [id number] Date of Birth: December 19, 1965  Today's Date: 11/03/2021 PT Individual Time: 1400-1500; 0177-9390 PT Individual Time Calculation (min): 60 min and 25 min  Short Term Goals: Week 3:  PT Short Term Goal 1 (Week 3): =LTG due to ELOS  Skilled Therapeutic Interventions/Progress Updates:    Session 1: Pt received sidelying in bed, agreeable to PT session. No complaints of pain. Rolling L/R with min A to don pants. Supine to sit max A for BLE management and trunk elevation. Slide board transfer to [facility] with min A. Session focus on PWC mobility in functional environment navigating on/off elevators and outdoors at mod I level. Pt demonstrates good awareness of her environment and demonstrates good ability to navigate environment safely. Seated endurance task performing volleyball with 3# dowel rod, 3 x 15 reps to fatigue. Pt agreeable to remain seated in PWC in room at end of session, needs in reach. Pt with good recall of pressure relief schedule and technique and performs pressure relief at end of session.  Session 2: Pt received seated in PWC in room, agreeable to therapy session. No complaints of pain. Pt requesting to transfer back to bed. Slide board transfer with min A. Sit to supine mod A for BLE management. Rolling L/R with min A to doff clothing and don hospital gown. Pt left in L sidelying in bed with needs in reach at end of session.  Therapy Documentation Precautions:  Precautions Precautions: Fall, Back Precaution Comments: Brace donned if HOB >30; Don brace in [place] with abdominal binder on first to control excess skin around trunk. Required Braces or Orthoses: Spinal Brace Spinal Brace: Thoracolumbosacral orthotic, Applied in supine position Restrictions Weight Bearing Restrictions: No      Therapy/Group: Individual Therapy   Excell Seltzer, PT, DPT, CSRS  11/03/2021, 4:38 PM

## 2021-11-03 NOTE — Progress Notes (Signed)
Bowel program started. Dig stim performed. Medium soft stool returned. Suppository inserted. Sheela Stack, LPN

## 2021-11-03 NOTE — Progress Notes (Signed)
Occupational Therapy Session Note  Patient Details  Name: Ellen Lane MRN: [id number] Date of Birth: 1966/03/30  Today's Date: 11/03/2021 OT Individual Time: 1000-1100 OT Individual Time Calculation (min): 60 min    Short Term Goals: Week 3:  OT Short Term Goal 1 (Week 3): STGs = LTGs 2/2 LOS  Skilled Therapeutic Interventions/Progress Updates:    OT intervention with focus on bed mobility, SB transfers, BUE therex, PWC mobility, and safety awareness. Supine>sit EOB with max A. SB transfers with min A+1. Sitting balance EOB with min A. Pt used BUE arm bike 5x44mins. Pt returned to room and bed. All needs within reach.  Therapy Documentation Precautions:  Precautions Precautions: Fall, Back Precaution Comments: Brace donned if HOB >30; Don brace in [place] with abdominal binder on first to control excess skin around trunk. Required Braces or Orthoses: Spinal Brace Spinal Brace: Thoracolumbosacral orthotic, Applied in supine position Restrictions Weight Bearing Restrictions: No   Pain: Pain Assessment Pain Scale: 0-10 Pain Score: 0-No pain   Therapy/Group: Individual Therapy  Leroy Libman 11/03/2021, 11:06 AM

## 2021-11-04 ENCOUNTER — Inpatient Hospital Stay (HOSPITAL_COMMUNITY): Payer: Medicare Other | Admitting: Certified Registered Nurse Anesthetist

## 2021-11-04 ENCOUNTER — Encounter (HOSPITAL_COMMUNITY): Payer: Self-pay | Admitting: Physical Medicine and Rehabilitation

## 2021-11-04 ENCOUNTER — Inpatient Hospital Stay (HOSPITAL_COMMUNITY): Payer: Medicare Other

## 2021-11-04 ENCOUNTER — Encounter (HOSPITAL_COMMUNITY): Payer: Self-pay | Admitting: Neurosurgery

## 2021-11-04 ENCOUNTER — Encounter (HOSPITAL_COMMUNITY)
Admission: RE | Disposition: A | Discharge status: Other Acute Inpatient Hospital | Payer: Self-pay | Source: Intra-hospital | Attending: Physical Medicine and Rehabilitation

## 2021-11-04 ENCOUNTER — Encounter (HOSPITAL_COMMUNITY)
Admission: RE | Disposition: A | Discharge status: Home Health | Payer: Self-pay | Source: Home / Self Care | Attending: Neurosurgery

## 2021-11-04 ENCOUNTER — Inpatient Hospital Stay (HOSPITAL_COMMUNITY)
Admission: RE | Admit: 2021-11-04 | Discharge: 2021-11-18 | DRG: 982 | Disposition: A | Discharge status: Home Health | Payer: Medicare Other | Attending: Neurosurgery | Admitting: Neurosurgery

## 2021-11-04 DIAGNOSIS — E669 Obesity, unspecified: Secondary | ICD-10-CM | POA: Diagnosis present

## 2021-11-04 DIAGNOSIS — D62 Acute posthemorrhagic anemia: Secondary | ICD-10-CM | POA: Diagnosis present

## 2021-11-04 DIAGNOSIS — Z981 Arthrodesis status: Secondary | ICD-10-CM | POA: Diagnosis not present

## 2021-11-04 DIAGNOSIS — G4730 Sleep apnea, unspecified: Secondary | ICD-10-CM | POA: Diagnosis present

## 2021-11-04 DIAGNOSIS — B372 Candidiasis of skin and nail: Secondary | ICD-10-CM | POA: Diagnosis not present

## 2021-11-04 DIAGNOSIS — M4624 Osteomyelitis of vertebra, thoracic region: Secondary | ICD-10-CM

## 2021-11-04 DIAGNOSIS — G8220 Paraplegia, unspecified: Secondary | ICD-10-CM | POA: Diagnosis present

## 2021-11-04 DIAGNOSIS — Y838 Other surgical procedures as the cause of abnormal reaction of the patient, or of later complication, without mention of misadventure at the time of the procedure: Secondary | ICD-10-CM | POA: Diagnosis not present

## 2021-11-04 DIAGNOSIS — E1169 Type 2 diabetes mellitus with other specified complication: Secondary | ICD-10-CM | POA: Diagnosis present

## 2021-11-04 DIAGNOSIS — M4644 Discitis, unspecified, thoracic region: Secondary | ICD-10-CM | POA: Diagnosis present

## 2021-11-04 DIAGNOSIS — G9609 Other spinal cerebrospinal fluid leak: Secondary | ICD-10-CM | POA: Diagnosis not present

## 2021-11-04 DIAGNOSIS — Z6830 Body mass index (BMI) 30.0-30.9, adult: Secondary | ICD-10-CM

## 2021-11-04 DIAGNOSIS — B20 Human immunodeficiency virus [HIV] disease: Secondary | ICD-10-CM | POA: Diagnosis present

## 2021-11-04 DIAGNOSIS — I10 Essential (primary) hypertension: Secondary | ICD-10-CM | POA: Diagnosis present

## 2021-11-04 DIAGNOSIS — M4620 Osteomyelitis of vertebra, site unspecified: Secondary | ICD-10-CM | POA: Diagnosis present

## 2021-11-04 DIAGNOSIS — M6008 Infective myositis, other site: Secondary | ICD-10-CM | POA: Diagnosis not present

## 2021-11-04 DIAGNOSIS — Z419 Encounter for procedure for purposes other than remedying health state, unspecified: Secondary | ICD-10-CM

## 2021-11-04 DIAGNOSIS — S24109S Unspecified injury at unspecified level of thoracic spinal cord, sequela: Secondary | ICD-10-CM

## 2021-11-04 DIAGNOSIS — G061 Intraspinal abscess and granuloma: Secondary | ICD-10-CM | POA: Diagnosis present

## 2021-11-04 DIAGNOSIS — Z452 Encounter for adjustment and management of vascular access device: Secondary | ICD-10-CM

## 2021-11-04 LAB — GLUCOSE, CAPILLARY
Glucose-Capillary: 94 mg/dL (ref 70–99)
Glucose-Capillary: 87 mg/dL (ref 70–99)
Glucose-Capillary: 83 mg/dL (ref 70–99)
Glucose-Capillary: 134 mg/dL — ABNORMAL HIGH (ref 70–99)
Glucose-Capillary: 182 mg/dL — ABNORMAL HIGH (ref 70–99)

## 2021-11-04 LAB — POCT I-STAT, CHEM 8
BUN: 11 mg/dL (ref 6–20)
BUN: 11 mg/dL (ref 6–20)
Calcium, Ion: 1.23 mmol/L (ref 1.15–1.40)
Calcium, Ion: 1.26 mmol/L (ref 1.15–1.40)
Chloride: 105 mmol/L (ref 98–111)
Chloride: 105 mmol/L (ref 98–111)
Creatinine, Ser: 0.3 mg/dL — ABNORMAL LOW (ref 0.44–1.00)
Creatinine, Ser: 0.3 mg/dL — ABNORMAL LOW (ref 0.44–1.00)
Glucose, Bld: 111 mg/dL — ABNORMAL HIGH (ref 70–99)
Glucose, Bld: 126 mg/dL — ABNORMAL HIGH (ref 70–99)
HCT: 32 % — ABNORMAL LOW (ref 36.0–46.0)
HCT: 29 % — ABNORMAL LOW (ref 36.0–46.0)
Hemoglobin: 10.9 g/dL — ABNORMAL LOW (ref 12.0–15.0)
Hemoglobin: 9.9 g/dL — ABNORMAL LOW (ref 12.0–15.0)
Potassium: 3.8 mmol/L (ref 3.5–5.1)
Potassium: 4.1 mmol/L (ref 3.5–5.1)
Sodium: 139 mmol/L (ref 135–145)
Sodium: 139 mmol/L (ref 135–145)
TCO2: 22 mmol/L (ref 22–32)
TCO2: 22 mmol/L (ref 22–32)

## 2021-11-04 LAB — PREPARE RBC (CROSSMATCH)

## 2021-11-04 LAB — SURGICAL PCR SCREEN
MRSA, PCR: NEGATIVE
Staphylococcus aureus: NEGATIVE

## 2021-11-04 SURGERY — POSTERIOR LUMBAR FUSION 3 LEVEL
Anesthesia: General

## 2021-11-04 MED ORDER — SODIUM CHLORIDE 0.9% FLUSH
10.0000 mL | Freq: Two times a day (BID) | INTRAVENOUS | Status: DC
  Administered 2021-11-05: 20 mL
  Administered 2021-11-05 – 2021-11-16 (×13): 10 mL

## 2021-11-04 MED ORDER — ACETAMINOPHEN 325 MG PO TABS
650.0000 mg | ORAL_TABLET | ORAL | Status: DC | PRN
  Administered 2021-11-06 – 2021-11-09 (×4): 650 mg via ORAL
  Filled 2021-11-04 (×4): qty 2

## 2021-11-04 MED ORDER — ALTEPLASE 2 MG IJ SOLR
2.0000 mg | Freq: Once | INTRAMUSCULAR | Status: AC
  Administered 2021-11-04: 2 mg
  Filled 2021-11-04: qty 2

## 2021-11-04 MED ORDER — OXYCODONE HCL 5 MG PO TABS: 5.0000 mg | ORAL_TABLET | Freq: Once | ORAL | Status: DC | PRN

## 2021-11-04 MED ORDER — DEXAMETHASONE SODIUM PHOSPHATE 10 MG/ML IJ SOLN
INTRAMUSCULAR | Status: DC | PRN
  Administered 2021-11-04: 5 mg via INTRAVENOUS

## 2021-11-04 MED ORDER — DAPTOMYCIN IV (FOR PTA / DISCHARGE USE ONLY)
600.0000 mg | INTRAVENOUS | Status: DC
Start: 2021-11-04 — End: 2021-11-04

## 2021-11-04 MED ORDER — POLYETHYLENE GLYCOL 3350 17 G PO PACK
17.0000 g | PACK | Freq: Every day | ORAL | Status: DC
  Administered 2021-11-05 – 2021-11-17 (×12): 17 g via ORAL
  Filled 2021-11-04 (×11): qty 1

## 2021-11-04 MED ORDER — ONDANSETRON HCL 4 MG PO TABS: 4.0000 mg | ORAL_TABLET | Freq: Four times a day (QID) | ORAL | Status: DC | PRN

## 2021-11-04 MED ORDER — ACETAMINOPHEN 500 MG PO TABS
1000.0000 mg | ORAL_TABLET | Freq: Once | ORAL | Status: AC
  Administered 2021-11-04: 1000 mg via ORAL
  Filled 2021-11-04: qty 2

## 2021-11-04 MED ORDER — LACTATED RINGERS IV SOLN: INTRAVENOUS | Status: DC

## 2021-11-04 MED ORDER — OXYCODONE-ACETAMINOPHEN 5-325 MG PO TABS
1.0000 | ORAL_TABLET | ORAL | Status: DC | PRN
  Administered 2021-11-04 – 2021-11-08 (×2): 2 via ORAL
  Administered 2021-11-16: 1 via ORAL
  Filled 2021-11-04 (×2): qty 2
  Filled 2021-11-04: qty 1

## 2021-11-04 MED ORDER — CEFAZOLIN SODIUM-DEXTROSE 2-4 GM/100ML-% IV SOLN
2.0000 g | Freq: Three times a day (TID) | INTRAVENOUS | Status: DC
  Administered 2021-11-05 (×2): 2 g via INTRAVENOUS
  Filled 2021-11-04 (×2): qty 100

## 2021-11-04 MED ORDER — 0.9 % SODIUM CHLORIDE (POUR BTL) OPTIME
TOPICAL | Status: DC | PRN
  Administered 2021-11-04: 1000 mL

## 2021-11-04 MED ORDER — ALBUMIN HUMAN 5 % IV SOLN: INTRAVENOUS | Status: DC | PRN

## 2021-11-04 MED ORDER — BISACODYL 10 MG RE SUPP
10.0000 mg | Freq: Every day | RECTAL | Status: DC
  Administered 2021-11-05 – 2021-11-17 (×11): 10 mg via RECTAL
  Filled 2021-11-04 (×10): qty 1

## 2021-11-04 MED ORDER — ESMOLOL HCL 100 MG/10ML IV SOLN
INTRAVENOUS | Status: DC | PRN
Start: 2021-11-04 — End: 2021-11-04
  Administered 2021-11-04: 20 mg via INTRAVENOUS

## 2021-11-04 MED ORDER — PHENYLEPHRINE 40 MCG/ML (10ML) SYRINGE FOR IV PUSH (FOR BLOOD PRESSURE SUPPORT)
PREFILLED_SYRINGE | INTRAVENOUS | Status: DC | PRN
  Administered 2021-11-04: 40 ug via INTRAVENOUS
  Administered 2021-11-04 (×2): 80 ug via INTRAVENOUS
  Administered 2021-11-04: 120 ug via INTRAVENOUS
  Administered 2021-11-04: 40 ug via INTRAVENOUS
  Administered 2021-11-04: 80 ug via INTRAVENOUS

## 2021-11-04 MED ORDER — CHLORHEXIDINE GLUCONATE CLOTH 2 % EX PADS
6.0000 | MEDICATED_PAD | Freq: Once | CUTANEOUS | Status: AC
  Administered 2021-11-04: 6 via TOPICAL

## 2021-11-04 MED ORDER — BACLOFEN 10 MG PO TABS
10.0000 mg | ORAL_TABLET | Freq: Three times a day (TID) | ORAL | Status: DC
  Administered 2021-11-04 – 2021-11-18 (×41): 10 mg via ORAL
  Filled 2021-11-04 (×41): qty 1

## 2021-11-04 MED ORDER — CHLORHEXIDINE GLUCONATE CLOTH 2 % EX PADS
6.0000 | MEDICATED_PAD | Freq: Every day | CUTANEOUS | Status: DC
  Administered 2021-11-05 – 2021-11-18 (×14): 6 via TOPICAL

## 2021-11-04 MED ORDER — SODIUM CHLORIDE 0.9 % IV SOLN
600.0000 mg | Freq: Every day | INTRAVENOUS | Status: DC
  Administered 2021-11-04 – 2021-11-17 (×14): 600 mg via INTRAVENOUS
  Filled 2021-11-04 (×18): qty 12

## 2021-11-04 MED ORDER — MIDAZOLAM HCL 2 MG/2ML IJ SOLN
INTRAMUSCULAR | Status: AC
  Filled 2021-11-04: qty 2

## 2021-11-04 MED ORDER — CHLORHEXIDINE GLUCONATE 0.12 % MT SOLN: 15.0000 mL | Freq: Once | OROMUCOSAL | Status: AC

## 2021-11-04 MED ORDER — PROPOFOL 10 MG/ML IV BOLUS
INTRAVENOUS | Status: DC | PRN
  Administered 2021-11-04: 90 mg via INTRAVENOUS

## 2021-11-04 MED ORDER — FENTANYL CITRATE (PF) 250 MCG/5ML IJ SOLN
INTRAMUSCULAR | Status: AC
  Filled 2021-11-04: qty 5

## 2021-11-04 MED ORDER — BISACODYL 5 MG PO TBEC
5.0000 mg | DELAYED_RELEASE_TABLET | Freq: Every day | ORAL | Status: DC | PRN
  Administered 2021-11-13 – 2021-11-17 (×2): 5 mg via ORAL
  Filled 2021-11-04 (×2): qty 1

## 2021-11-04 MED ORDER — ROCURONIUM BROMIDE 10 MG/ML (PF) SYRINGE
PREFILLED_SYRINGE | INTRAVENOUS | Status: DC | PRN
Start: 2021-11-04 — End: 2021-11-04
  Administered 2021-11-04 (×3): 30 mg via INTRAVENOUS
  Administered 2021-11-04: 10 mg via INTRAVENOUS
  Administered 2021-11-04: 30 mg via INTRAVENOUS
  Administered 2021-11-04: 60 mg via INTRAVENOUS

## 2021-11-04 MED ORDER — MIRABEGRON ER 50 MG PO TB24
50.0000 mg | ORAL_TABLET | Freq: Every day | ORAL | Status: DC
  Administered 2021-11-05 – 2021-11-18 (×14): 50 mg via ORAL
  Filled 2021-11-04 (×14): qty 1

## 2021-11-04 MED ORDER — LIDOCAINE 2% (20 MG/ML) 5 ML SYRINGE
INTRAMUSCULAR | Status: AC
  Filled 2021-11-04: qty 10

## 2021-11-04 MED ORDER — EPHEDRINE SULFATE-NACL 50-0.9 MG/10ML-% IV SOSY
PREFILLED_SYRINGE | INTRAVENOUS | Status: DC | PRN
Start: 2021-11-04 — End: 2021-11-04
  Administered 2021-11-04: 10 mg via INTRAVENOUS

## 2021-11-04 MED ORDER — BUPIVACAINE-EPINEPHRINE (PF) 0.5% -1:200000 IJ SOLN
INTRAMUSCULAR | Status: DC | PRN
  Administered 2021-11-04: 10 mL

## 2021-11-04 MED ORDER — PHENYLEPHRINE 40 MCG/ML (10ML) SYRINGE FOR IV PUSH (FOR BLOOD PRESSURE SUPPORT)
PREFILLED_SYRINGE | INTRAVENOUS | Status: AC
  Filled 2021-11-04: qty 10

## 2021-11-04 MED ORDER — ADULT MULTIVITAMIN W/MINERALS CH
1.0000 | ORAL_TABLET | Freq: Every day | ORAL | Status: DC
  Administered 2021-11-05 – 2021-11-17 (×14): 1 via ORAL
  Filled 2021-11-04 (×14): qty 1

## 2021-11-04 MED ORDER — ONDANSETRON HCL 4 MG/2ML IJ SOLN
INTRAMUSCULAR | Status: AC
  Filled 2021-11-04: qty 4

## 2021-11-04 MED ORDER — ZOLPIDEM TARTRATE 5 MG PO TABS
5.0000 mg | ORAL_TABLET | Freq: Every evening | ORAL | Status: DC | PRN
  Administered 2021-11-08 – 2021-11-17 (×6): 5 mg via ORAL
  Filled 2021-11-04 (×7): qty 1

## 2021-11-04 MED ORDER — MENTHOL 3 MG MT LOZG: 1.0000 | LOZENGE | OROMUCOSAL | Status: DC | PRN

## 2021-11-04 MED ORDER — CEFEPIME IV (FOR PTA / DISCHARGE USE ONLY): 2.0000 g | Freq: Three times a day (TID) | INTRAVENOUS | Status: DC

## 2021-11-04 MED ORDER — PHENYLEPHRINE HCL-NACL 20-0.9 MG/250ML-% IV SOLN
INTRAVENOUS | Status: DC | PRN
Start: 2021-11-04 — End: 2021-11-04
  Administered 2021-11-04: 40 ug/min via INTRAVENOUS

## 2021-11-04 MED ORDER — PHENOL 1.4 % MT LIQD: 1.0000 | OROMUCOSAL | Status: DC | PRN

## 2021-11-04 MED ORDER — MORPHINE SULFATE (PF) 4 MG/ML IV SOLN
4.0000 mg | INTRAVENOUS | Status: DC | PRN
  Administered 2021-11-05: 4 mg via INTRAVENOUS
  Filled 2021-11-04 (×2): qty 1

## 2021-11-04 MED ORDER — SODIUM CHLORIDE 0.9 % IV SOLN
250.0000 mL | INTRAVENOUS | Status: DC
  Administered 2021-11-04 – 2021-11-09 (×2): 250 mL via INTRAVENOUS

## 2021-11-04 MED ORDER — SODIUM CHLORIDE 0.9% FLUSH: 3.0000 mL | INTRAVENOUS | Status: DC | PRN

## 2021-11-04 MED ORDER — ROCURONIUM BROMIDE 10 MG/ML (PF) SYRINGE
PREFILLED_SYRINGE | INTRAVENOUS | Status: AC
  Filled 2021-11-04: qty 10

## 2021-11-04 MED ORDER — POLYETHYLENE GLYCOL 3350 17 G PO PACK
17.0000 g | PACK | Freq: Every day | ORAL | Status: DC | PRN
  Filled 2021-11-04: qty 1

## 2021-11-04 MED ORDER — SENNOSIDES-DOCUSATE SODIUM 8.6-50 MG PO TABS
2.0000 | ORAL_TABLET | Freq: Every day | ORAL | Status: DC
  Administered 2021-11-05 – 2021-11-17 (×12): 2 via ORAL
  Filled 2021-11-04 (×12): qty 2

## 2021-11-04 MED ORDER — ONDANSETRON HCL 4 MG/2ML IJ SOLN
INTRAMUSCULAR | Status: DC | PRN
Start: 2021-11-04 — End: 2021-11-04
  Administered 2021-11-04: 4 mg via INTRAVENOUS

## 2021-11-04 MED ORDER — THROMBIN 5000 UNITS EX SOLR
CUTANEOUS | Status: AC
  Filled 2021-11-04: qty 5000

## 2021-11-04 MED ORDER — SODIUM CHLORIDE 0.9 % IV SOLN
2.0000 g | Freq: Three times a day (TID) | INTRAVENOUS | Status: DC
  Administered 2021-11-05 – 2021-11-18 (×40): 2 g via INTRAVENOUS
  Filled 2021-11-04 (×41): qty 2

## 2021-11-04 MED ORDER — ORAL CARE MOUTH RINSE: 15.0000 mL | Freq: Once | OROMUCOSAL | Status: AC

## 2021-11-04 MED ORDER — CEFAZOLIN SODIUM-DEXTROSE 2-4 GM/100ML-% IV SOLN
2.0000 g | INTRAVENOUS | Status: AC
  Administered 2021-11-04 (×2): 2 g via INTRAVENOUS
  Filled 2021-11-04: qty 100

## 2021-11-04 MED ORDER — SODIUM CHLORIDE 0.9 % IV SOLN: INTRAVENOUS | Status: DC | PRN

## 2021-11-04 MED ORDER — FENTANYL CITRATE (PF) 250 MCG/5ML IJ SOLN
INTRAMUSCULAR | Status: DC | PRN
Start: 2021-11-04 — End: 2021-11-04
  Administered 2021-11-04: 50 ug via INTRAVENOUS
  Administered 2021-11-04: 25 ug via INTRAVENOUS
  Administered 2021-11-04: 100 ug via INTRAVENOUS
  Administered 2021-11-04 (×2): 50 ug via INTRAVENOUS
  Administered 2021-11-04: 25 ug via INTRAVENOUS
  Administered 2021-11-04: 50 ug via INTRAVENOUS

## 2021-11-04 MED ORDER — LIDOCAINE 2% (20 MG/ML) 5 ML SYRINGE
INTRAMUSCULAR | Status: DC | PRN
Start: 2021-11-04 — End: 2021-11-04
  Administered 2021-11-04: 60 mg via INTRAVENOUS

## 2021-11-04 MED ORDER — SODIUM CHLORIDE 0.9% FLUSH: 10.0000 mL | INTRAVENOUS | Status: DC | PRN

## 2021-11-04 MED ORDER — BACITRACIN ZINC 500 UNIT/GM EX OINT
TOPICAL_OINTMENT | CUTANEOUS | Status: DC | PRN
  Administered 2021-11-04: 1 via TOPICAL

## 2021-11-04 MED ORDER — DEXAMETHASONE SODIUM PHOSPHATE 10 MG/ML IJ SOLN
INTRAMUSCULAR | Status: AC
  Filled 2021-11-04: qty 2

## 2021-11-04 MED ORDER — INSULIN ASPART 100 UNIT/ML IJ SOLN
0.0000 [IU] | INTRAMUSCULAR | Status: DC
  Administered 2021-11-04: 4 [IU] via SUBCUTANEOUS
  Administered 2021-11-05: 3 [IU] via SUBCUTANEOUS
  Administered 2021-11-05: 4 [IU] via SUBCUTANEOUS
  Administered 2021-11-05 (×2): 3 [IU] via SUBCUTANEOUS
  Administered 2021-11-06: 4 [IU] via SUBCUTANEOUS
  Administered 2021-11-06: 3 [IU] via SUBCUTANEOUS
  Administered 2021-11-06: 4 [IU] via SUBCUTANEOUS
  Administered 2021-11-06: 3 [IU] via SUBCUTANEOUS

## 2021-11-04 MED ORDER — BISACODYL 10 MG RE SUPP
10.0000 mg | Freq: Every day | RECTAL | Status: DC | PRN
  Filled 2021-11-04: qty 1

## 2021-11-04 MED ORDER — CHLORHEXIDINE GLUCONATE 0.12 % MT SOLN
OROMUCOSAL | Status: AC
  Administered 2021-11-04: 15 mL via OROMUCOSAL
  Filled 2021-11-04: qty 15

## 2021-11-04 MED ORDER — ESMOLOL HCL 100 MG/10ML IV SOLN
INTRAVENOUS | Status: AC
  Filled 2021-11-04: qty 10

## 2021-11-04 MED ORDER — CYCLOBENZAPRINE HCL 10 MG PO TABS
10.0000 mg | ORAL_TABLET | Freq: Three times a day (TID) | ORAL | Status: DC | PRN
  Administered 2021-11-08 – 2021-11-15 (×5): 10 mg via ORAL
  Filled 2021-11-04 (×5): qty 1

## 2021-11-04 MED ORDER — DOCUSATE SODIUM 100 MG PO CAPS
100.0000 mg | ORAL_CAPSULE | Freq: Two times a day (BID) | ORAL | Status: DC
  Administered 2021-11-05 – 2021-11-11 (×13): 100 mg via ORAL
  Filled 2021-11-04 (×14): qty 1

## 2021-11-04 MED ORDER — OXYCODONE HCL 5 MG PO TABS
10.0000 mg | ORAL_TABLET | ORAL | Status: DC | PRN
  Administered 2021-11-10 – 2021-11-16 (×2): 10 mg via ORAL
  Filled 2021-11-04 (×3): qty 2

## 2021-11-04 MED ORDER — BICTEGRAVIR-EMTRICITAB-TENOFOV 50-200-25 MG PO TABS
1.0000 | ORAL_TABLET | Freq: Every day | ORAL | Status: DC
  Administered 2021-11-05 – 2021-11-18 (×13): 1 via ORAL
  Filled 2021-11-04 (×16): qty 1

## 2021-11-04 MED ORDER — SUGAMMADEX SODIUM 200 MG/2ML IV SOLN
INTRAVENOUS | Status: DC | PRN
Start: 2021-11-04 — End: 2021-11-04
  Administered 2021-11-04: 200 mg via INTRAVENOUS

## 2021-11-04 MED ORDER — ACETAMINOPHEN 500 MG PO TABS
1000.0000 mg | ORAL_TABLET | Freq: Four times a day (QID) | ORAL | Status: AC
  Administered 2021-11-05 (×4): 1000 mg via ORAL
  Filled 2021-11-04 (×4): qty 2

## 2021-11-04 MED ORDER — MIDAZOLAM HCL 2 MG/2ML IJ SOLN
INTRAMUSCULAR | Status: DC | PRN
Start: 2021-11-04 — End: 2021-11-04
  Administered 2021-11-04: 2 mg via INTRAVENOUS

## 2021-11-04 MED ORDER — BUPIVACAINE-EPINEPHRINE 0.5% -1:200000 IJ SOLN
INTRAMUSCULAR | Status: AC
  Filled 2021-11-04: qty 1

## 2021-11-04 MED ORDER — PROPOFOL 10 MG/ML IV BOLUS
INTRAVENOUS | Status: AC
  Filled 2021-11-04: qty 20

## 2021-11-04 MED ORDER — THROMBIN 5000 UNITS EX SOLR
OROMUCOSAL | Status: DC | PRN
  Administered 2021-11-04 (×2): 5 mL via TOPICAL

## 2021-11-04 MED ORDER — ACETAMINOPHEN 650 MG RE SUPP: 650.0000 mg | RECTAL | Status: DC | PRN

## 2021-11-04 MED ORDER — LACTATED RINGERS IV SOLN: INTRAVENOUS | Status: DC | PRN

## 2021-11-04 MED ORDER — ROCURONIUM BROMIDE 10 MG/ML (PF) SYRINGE
PREFILLED_SYRINGE | INTRAVENOUS | Status: AC
  Filled 2021-11-04: qty 20

## 2021-11-04 MED ORDER — BUPIVACAINE LIPOSOME 1.3 % IJ SUSP
INTRAMUSCULAR | Status: AC
  Filled 2021-11-04: qty 20

## 2021-11-04 MED ORDER — DEXAMETHASONE SODIUM PHOSPHATE 10 MG/ML IJ SOLN
INTRAMUSCULAR | Status: AC
  Filled 2021-11-04: qty 1

## 2021-11-04 MED ORDER — SODIUM CHLORIDE 0.9% FLUSH
3.0000 mL | Freq: Two times a day (BID) | INTRAVENOUS | Status: DC
  Administered 2021-11-04 – 2021-11-11 (×6): 3 mL via INTRAVENOUS

## 2021-11-04 MED ORDER — MEPERIDINE HCL 25 MG/ML IJ SOLN: 6.2500 mg | INTRAMUSCULAR | Status: DC | PRN

## 2021-11-04 MED ORDER — ONDANSETRON HCL 4 MG/2ML IJ SOLN
4.0000 mg | Freq: Four times a day (QID) | INTRAMUSCULAR | Status: DC | PRN
  Administered 2021-11-05 – 2021-11-10 (×2): 4 mg via INTRAVENOUS
  Filled 2021-11-04 (×2): qty 2

## 2021-11-04 MED ORDER — MIDAZOLAM HCL 2 MG/2ML IJ SOLN: 0.5000 mg | Freq: Once | INTRAMUSCULAR | Status: DC | PRN

## 2021-11-04 MED ORDER — KETOROLAC TROMETHAMINE 30 MG/ML IJ SOLN
INTRAMUSCULAR | Status: AC
  Filled 2021-11-04: qty 1

## 2021-11-04 MED ORDER — HYDROMORPHONE HCL 1 MG/ML IJ SOLN: 0.2500 mg | INTRAMUSCULAR | Status: DC | PRN

## 2021-11-04 MED ORDER — PROMETHAZINE HCL 25 MG/ML IJ SOLN: 6.2500 mg | INTRAMUSCULAR | Status: DC | PRN

## 2021-11-04 MED ORDER — OXYCODONE HCL 5 MG PO TABS
5.0000 mg | ORAL_TABLET | ORAL | Status: DC | PRN
  Administered 2021-11-09: 5 mg via ORAL
  Filled 2021-11-04 (×2): qty 1

## 2021-11-04 MED ORDER — BACITRACIN ZINC 500 UNIT/GM EX OINT
TOPICAL_OINTMENT | CUTANEOUS | Status: AC
  Filled 2021-11-04: qty 28.35

## 2021-11-04 MED ORDER — POLYETHYLENE GLYCOL 3350 17 GM/SCOOP PO POWD
17.0000 g | Freq: Every day | ORAL | Status: DC | PRN
  Filled 2021-11-04: qty 255

## 2021-11-04 MED ORDER — OXYCODONE HCL 5 MG/5ML PO SOLN: 5.0000 mg | Freq: Once | ORAL | Status: DC | PRN

## 2021-11-04 MED ORDER — EPHEDRINE 5 MG/ML INJ
INTRAVENOUS | Status: AC
  Filled 2021-11-04: qty 5

## 2021-11-04 SURGICAL SUPPLY — 84 items
APL SKNCLS STERI-STRIP NONHPOA (GAUZE/BANDAGES/DRESSINGS) ×2
BAG COUNTER SPONGE SURGICOUNT (BAG) ×5 IMPLANT
BAG SPNG CNTER NS LX DISP (BAG) ×3
BASKET BONE COLLECTION (BASKET) ×2 IMPLANT
BENZOIN TINCTURE PRP APPL 2/3 (GAUZE/BANDAGES/DRESSINGS) ×4 IMPLANT
BIT DRILL NEURO 2X3.1 SFT TUCH (MISCELLANEOUS) IMPLANT
BLADE CLIPPER SURG (BLADE) IMPLANT
BUR MATCHSTICK NEURO 3.0 LAGG (BURR) ×3 IMPLANT
BUR PRECISION FLUTE 6.0 (BURR) ×3 IMPLANT
CANISTER SUCT 3000ML PPV (MISCELLANEOUS) ×5 IMPLANT
CAP LOCK DLX THRD (Cap) ×10 IMPLANT
CARTRIDGE OIL MAESTRO DRILL (MISCELLANEOUS) ×2 IMPLANT
CNTNR URN SCR LID CUP LEK RST (MISCELLANEOUS) ×2 IMPLANT
CONT SPEC 4OZ STRL OR WHT (MISCELLANEOUS) ×2
COVER BACK TABLE 60X90IN (DRAPES) ×3 IMPLANT
DECANTER SPIKE VIAL GLASS SM (MISCELLANEOUS) ×2 IMPLANT
DIFFUSER DRILL AIR PNEUMATIC (MISCELLANEOUS) ×3 IMPLANT
DRAPE C-ARM 42X72 X-RAY (DRAPES) ×9 IMPLANT
DRAPE HALF SHEET 40X57 (DRAPES) ×7 IMPLANT
DRAPE LAPAROTOMY 100X72X124 (DRAPES) ×3 IMPLANT
DRAPE SURG 17X23 STRL (DRAPES) ×12 IMPLANT
DRILL NEURO 2X3.1 SOFT TOUCH (MISCELLANEOUS) ×2
DRSG OPSITE POSTOP 4X10 (GAUZE/BANDAGES/DRESSINGS) ×1 IMPLANT
DRSG OPSITE POSTOP 4X6 (GAUZE/BANDAGES/DRESSINGS) ×3 IMPLANT
ELECT BLADE 4.0 EZ CLEAN MEGAD (MISCELLANEOUS) ×2
ELECT CAUTERY BLADE 6.4 (BLADE) ×1 IMPLANT
ELECT REM PT RETURN 9FT ADLT (ELECTROSURGICAL) ×2
ELECTRODE BLDE 4.0 EZ CLN MEGD (MISCELLANEOUS) ×2 IMPLANT
ELECTRODE REM PT RTRN 9FT ADLT (ELECTROSURGICAL) ×2 IMPLANT
ENDPLATE LOWER 0D 16X16 (Plate) ×1 IMPLANT
ENDPLATE UP FORTIFY 16X16 0D (Plate) ×3 IMPLANT
EVACUATOR 1/8 PVC DRAIN (DRAIN) ×1 IMPLANT
GAUZE 4X4 16PLY ~~LOC~~+RFID DBL (SPONGE) ×4 IMPLANT
GLOVE EXAM NITRILE XL STR (GLOVE) IMPLANT
GLOVE SURG ENC MOIS LTX SZ8 (GLOVE) ×6 IMPLANT
GLOVE SURG ENC MOIS LTX SZ8.5 (GLOVE) ×6 IMPLANT
GOWN STRL REUS W/ TWL LRG LVL3 (GOWN DISPOSABLE) IMPLANT
GOWN STRL REUS W/ TWL XL LVL3 (GOWN DISPOSABLE) ×4 IMPLANT
GOWN STRL REUS W/TWL 2XL LVL3 (GOWN DISPOSABLE) IMPLANT
GOWN STRL REUS W/TWL LRG LVL3 (GOWN DISPOSABLE)
GOWN STRL REUS W/TWL XL LVL3 (GOWN DISPOSABLE) ×4
GRAFT TRINITY ELITE LGE HUMAN (Tissue) ×1 IMPLANT
HEMOSTAT POWDER KIT SURGIFOAM (HEMOSTASIS) ×1 IMPLANT
KIT BASIN OR (CUSTOM PROCEDURE TRAY) ×3 IMPLANT
KIT GRAFTMAG DEL NEURO DISP (NEUROSURGERY SUPPLIES) IMPLANT
KIT POSITION SURG JACKSON T1 (MISCELLANEOUS) ×1 IMPLANT
KIT SUTURE REMOVAL HAMOT (SET/KITS/TRAYS/PACK) ×1 IMPLANT
KIT TURNOVER KIT B (KITS) ×3 IMPLANT
MILL MEDIUM DISP (BLADE) ×2 IMPLANT
NDL HYPO 21X1.5 SAFETY (NEEDLE) IMPLANT
NEEDLE HYPO 21X1.5 SAFETY (NEEDLE) IMPLANT
NEEDLE HYPO 22GX1.5 SAFETY (NEEDLE) ×3 IMPLANT
NS IRRIG 1000ML POUR BTL (IV SOLUTION) ×4 IMPLANT
OIL CARTRIDGE MAESTRO DRILL (MISCELLANEOUS) ×2
PACK LAMINECTOMY NEURO (CUSTOM PROCEDURE TRAY) ×3 IMPLANT
PAD ARMBOARD 7.5X6 YLW CONV (MISCELLANEOUS) ×9 IMPLANT
PATTIES SURGICAL .5 X1 (DISPOSABLE) IMPLANT
PATTIES SURGICAL 1X1 (DISPOSABLE) ×1 IMPLANT
PUTTY DBM 10CC CALC GRAN (Putty) ×1 IMPLANT
PUTTY DBM 5CC CALC GRAN (Putty) ×1 IMPLANT
ROD L635 HEX END UNLINED (Rod) ×1 IMPLANT
ROD SPINAL 6.35X300 COBALT (Rod) ×1 IMPLANT
SCREW DLX CREO 5.5X45 (Screw) ×1 IMPLANT
SCREW PA CREO DLX 6.5X45 (Screw) ×6 IMPLANT
SCREW PA DLX CREO 7.5X50 (Screw) ×4 IMPLANT
SPACER CORE FORTIFY 16X36-57 (Cage) ×1 IMPLANT
SPACER SP 20 UPPER ENDPLATE 0 (Spacer) ×1 IMPLANT
SPACER SP 25X30X20 L ENDPLATE (Spacer) ×1 IMPLANT
SPACER SP ANT CERV 20X41-64X20 (Spacer) ×1 IMPLANT
SPONGE NEURO XRAY DETECT 1X3 (DISPOSABLE) IMPLANT
SPONGE SURGIFOAM ABS GEL 100 (HEMOSTASIS) IMPLANT
SPONGE T-LAP 4X18 ~~LOC~~+RFID (SPONGE) ×2 IMPLANT
STRIP CLOSURE SKIN 1/2X4 (GAUZE/BANDAGES/DRESSINGS) ×4 IMPLANT
SUT SILK 0 TIES 10X30 (SUTURE) ×1 IMPLANT
SUT VIC AB 1 CT1 18XBRD ANBCTR (SUTURE) ×4 IMPLANT
SUT VIC AB 1 CT1 8-18 (SUTURE) ×8
SUT VIC AB 2-0 CP2 18 (SUTURE) ×8 IMPLANT
SWAB COLLECTION DEVICE MRSA (MISCELLANEOUS) ×2 IMPLANT
SWAB CULTURE ESWAB REG 1ML (MISCELLANEOUS) ×2 IMPLANT
SYR 20ML LL LF (SYRINGE) ×1 IMPLANT
TOWEL GREEN STERILE (TOWEL DISPOSABLE) ×3 IMPLANT
TOWEL GREEN STERILE FF (TOWEL DISPOSABLE) ×3 IMPLANT
TRAY FOLEY MTR SLVR 16FR STAT (SET/KITS/TRAYS/PACK) ×3 IMPLANT
WATER STERILE IRR 1000ML POUR (IV SOLUTION) ×3 IMPLANT

## 2021-11-04 NOTE — Progress Notes (Signed)
Orthopedic Tech Progress Note Patient Details:  Ellen Lane 08/09/1966 [id number] Ordered patients brace  Patient ID: [name], female   DOB: [birth year], 56 y.o.   MRN: [id number]  [name] 11/04/2021, 10:58 PM
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
## 2021-11-04 NOTE — Progress Notes (Signed)
PROGRESS NOTE   Subjective/Complaints:  Pt reports ready for surgery- had a few nights that had chills- asking about bandage on PICC to be changed- is due today- PICC line nursing will change after surgery.    ROS:  Pt denies SOB, abd pain, CP, N/V/C/D, and vision changes  Objective:   No results found. Recent Labs    11/02/21 0359  WBC 5.1  HGB 9.6*  HCT 29.2*  PLT 246     Recent Labs    11/02/21 0359  NA 136  K 4.2  CL 106  CO2 26  GLUCOSE 107*  BUN 18  CREATININE 0.44  CALCIUM 9.1      Intake/Output Summary (Last 24 hours) at 11/04/2021 0809 Last data filed at 11/04/2021 0540 Gross per 24 hour  Intake 1134 ml  Output 2300 ml  Net -1166 ml         Physical Exam: Vital Signs Blood pressure 92/69, pulse 79, temperature 98.4 F (36.9 C), temperature source Oral, resp. rate 15, height '5\' 2"'  (1.575 m), weight 69.9 kg, last menstrual period 01/08/2015, SpO2 100 %.     General: awake, alert, appropriate, laying on R side in bed; NAD HENT: conjugate gaze; oropharynx moist CV: regular rate; no JVD Pulmonary: CTA B/L; no W/R/R- good air movement GI: soft, NT, ND, (+)BS Psychiatric: appropriate Neurological: Ox3; no muscle spasms seen Skin: dressing on spine- C/D/I Extremities: No clubbing, cyanosis, or edema. Pulses are 2+- at rest, muscle atrophy/no Edema seen Skin: back incision cdi ; Coccyx- a little small scabbed over ulcer- looks unstageable, but only partial thickness- very superficial- dime sized, 0.1 cm X 0.2 cm no change on coccyx wound Neuro:  Alert and oriented x 3. Normal insight and awareness. Intact Memory. Normal language and speech. Cranial nerve exam unremarkable. ~T3 sensory level without sensation below level of injury. Stocking glove sensory loss in hands. Motor 0/5 LE, 4/5 in UE with some pain inhibition, ?peripheral weakness in HI's. DTR's 1+, no resting tone Musculoskeletal: mid  back tenderness  Atrophy of LE's.   Assessment/Plan: 1. Functional deficits which require 3+ hours per day of interdisciplinary therapy in a comprehensive inpatient rehab setting. Physiatrist is providing close team supervision and 24 hour management of active medical problems listed below. Physiatrist and rehab team continue to assess barriers to discharge/monitor patient progress toward functional and medical goals  Care Tool:  Bathing    Body parts bathed by patient: Right arm, Left arm, Chest, Abdomen, Face, Front perineal area, Right upper leg, Left upper leg, Buttocks, Right lower leg, Left lower leg   Body parts bathed by helper: Buttocks     Bathing assist Assist Level: Minimal Assistance - Patient > 75%     Upper Body Dressing/Undressing Upper body dressing   What is the patient wearing?: Pull over shirt    Upper body assist Assist Level: Moderate Assistance - Patient 50 - 74%    Lower Body Dressing/Undressing Lower body dressing      What is the patient wearing?: Incontinence brief, Pants     Lower body assist Assist for lower body dressing: Maximal Assistance - Patient 25 - 49%     Toileting  Toileting    Toileting assist Assist for toileting: Total Assistance - Patient < 25%     Transfers Chair/bed transfer  Transfers assist     Chair/bed transfer assist level: Minimal Assistance - Patient > 75%     Locomotion Ambulation   Ambulation assist   Ambulation activity did not occur: N/A          Walk 10 feet activity   Assist  Walk 10 feet activity did not occur: N/A        Walk 50 feet activity   Assist Walk 50 feet with 2 turns activity did not occur: N/A         Walk 150 feet activity   Assist Walk 150 feet activity did not occur: N/A         Walk 10 feet on uneven surface  activity   Assist Walk 10 feet on uneven surfaces activity did not occur: N/A         Wheelchair     Assist Is the patient using a  wheelchair?: Yes Type of Wheelchair: Power    Wheelchair assist level: Independent Max wheelchair distance: 150'    Wheelchair 50 feet with 2 turns activity    Assist        Assist Level: Independent   Wheelchair 150 feet activity     Assist      Assist Level: Independent   Blood pressure 92/69, pulse 79, temperature 98.4 F (36.9 C), temperature source Oral, resp. rate 15, height '5\' 2"'  (1.575 m), weight 69.9 kg, last menstrual period 01/08/2015, SpO2 100 %.  Medical Problem List and Plan: 1. Functional deficits secondary to thoracolumbar epidural abscess s/p T10-L3 laminotomy and decompression 09/28/21 in setting of T3 complete paraplegia             -patient may shower             -ELOS/Goals: 10-12 days, sup-min assist goals at w/c level  D/c 11/04/21- awaiting second stage thoracic spine surgery, with Dr. Arnoldo Morale.  Will be discharged straight to neurosurgery Con't CIR today- d/c to acute tomorrow- team conference today to finalize d/c plans.  1/18- D/c today to Surgery-  2.  Impaired mobility: Continue Lovenox             -antiplatelet therapy: N/a 3. Pain: Continue oxycodone prn for pain.  1/17- pain controlled- rarely takes pain meds- con't regimen prn.  4. Mood: LCSW to follow for evaluation and support.              -antipsychotic agents:  5. Neuropsych: This patient is capable of making decisions on her own behalf. 6. 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  --Continue ensure max and Prosource for wound healing.   12/30- pt doing a good job staying off backside when not in therapy.  1/2- will need to go home on Air mattress until Stage III healed completely. 1/4- since going back to acute before d/c, cannot set up prior- but will inform acute hospital with d/c.   1/6- wound healing so might not need air mattress at home- however pt said MIGHT have air mattress- doesn't remember- will check with nephew.  1/17- still has little area  on coccyx- prominent coccyx as well-little dehiscence of back wound- surgery tomorrow  1/18- surgery today.   7. Fluids/Electrolytes/Nutrition: encouraged PO.  1/2- labs look great- con't to monitor 8.  T10/11 discitis with thoracolumbar epidural abscess, osteomyelitis and instability : On IV cefepime/daptomycin  for 8 weeks from OR with end date 11/22/21. Adjusted medication timing so she receives at 7am rather than during her 8am therapy session.              12/26- will order CRP/ESR, CBC with diff and CMP weekly 1/4- ESR 42- stable.  1/16- ESR 75- up from 42- will have PA call ID to make sure no changes required- to go for surgery Wednesday.   1/17- ID wants new cultures during surgery this time 9. T2DM: Well controlled with A1C-6.0. Was on Ozempic PTA.  --CBG (last 3)  Recent Labs    11/03/21 2120 11/04/21 0124 11/04/21 0751  GLUCAP 91 94 87   1/7- Needs to still have Ozempic brought in- con't regimen fo rnow 1/16- will con't regimen- stable 10. HIV: Stable and undetectable viral load on Biktarvy 11. 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 OSA: Does not use CPAP 13. Morbid obesity s/p gastric bypass: Will resume multivitamin BID for supplement.   1/3- BMI- down to 30  1/16- Bmi down to 28.17- con't to monitor 14.  Stage III sacral decub: appear to me more like a stage II, now.  Photo on file is 12/8 and appears to be Stage III. What I see now looks much better.  Will continue air mattress for pressure relief measures             -- vitamin C and Zinc to promote wound healing.              --consult WOC for input.  1/4- is healing- now has scabbed over- con't to monitor/con't air mattress  1/17- still there- but smaller- con't air mattress and staying on side as much as possible.  15. Neurogenic bowel: will resume scheduled po dulcolax in AM and dulcolax supp in PM similar to what she does at home.   Freq BMs, 4x yesterday , likely  looser related to abx, also used less pain meds yesterday, will monitor prior to changes   1/16- having more Bms/loose because doing full liquid diet-   1/17- changed diet ot clear liquid diet per pt request. 16. Neurogenic bladder:             I/O cath schedule 4-5 x daily  12/27- going well except overflow- see #18  12/30- doing better with caths q4 hours 17. Spasticity  12/26- will increase Baclofen to 10 mg TID_ might need more.   12/27- will increase Baclofen if need be, on Thursday 12/29.   12/29- spasms much better controlled- con't 10 mg TID  1/5- Spasms doing much better not resolved, but doesn't impair function.  18. Bladder spasms/urinary overflow/leakage  12/26- will order Myrbetriq instead of Oxybutynin to help with bladder spasms.   12/27- no side effects- con't regimen  12/29- will increase caths to q4 hours- if this isn't enough, then will increase Myrbetriq for overflow leakage, but her volumes also very high.   12/30- volumes ~400cc- doing much better on q4 hours- will con't.   1/3- will increase Myrbetriq to 50 mg daily-   1/4- no side effects; no results yet with increase  1/5- feels like having less leakage- con't Myrbetriq 1/11- pt states having some higher volumes at times if she goes too long between caths   1/4- per  Dr Arnoldo Morale- he agrees no self caths/Bowel program until surgery, however can do after surgery no immediate post op restrictions .  20. History of  abnormal breast changes on cancer screening: discussed that she should follow-up with her outpatient provider on discharge if she notes changes in size of right breast lump.       NEED Cultures from surgery per ID- d/w Dr Arnoldo Morale   LOS: 25 days A FACE TO FACE EVALUATION WAS PERFORMED  Xavian Hardcastle 11/04/2021, 8:09 AM

## 2021-11-04 NOTE — Anesthesia Postprocedure Evaluation (Signed)
Anesthesia Post Note  Patient: Ellen Lane  Procedure(s) Performed: THORACIC ELEVEN,THORACIC TWELVE CORPECTOMY, INTERBODY PROSTHESIS, POSTERIOR INSTRUMENTATION FUSION THORACIC SEVEN- LUMBAR TWO     Patient location during evaluation: PACU Anesthesia Type: General Level of consciousness: awake and alert, patient cooperative and oriented Pain management: pain level controlled Vital Signs Assessment: post-procedure vital signs reviewed and stable Respiratory status: spontaneous breathing, nonlabored ventilation and respiratory function stable Cardiovascular status: blood pressure returned to baseline and stable Postop Assessment: no apparent nausea or vomiting Anesthetic complications: no   No notable events documented.  Last Vitals:  Vitals:   11/04/21 1839 11/04/21 1854  BP: 131/69 (!) 152/97  Pulse: (!) 107 100  Resp: 16 14  Temp:    SpO2: 100% 100%    Last Pain:  Vitals:   11/04/21 1824  TempSrc:   PainSc: 0-No pain                 [place] Krotz,E. Traci Plemons

## 2021-11-04 NOTE — Progress Notes (Addendum)
This is the patient's interim history and physical.  Subjective: The patient is alert and pleasant.  She is ready for surgery.  Objective: Vital signs in last 24 hours: Temp:  [97.5 F (36.4 C)-98.4 F (36.9 C)] 97.9 F (36.6 C) (01/18 0955) Pulse Rate:  [66-95] 66 (01/18 0955) Resp:  [15-18] 16 (01/18 0955) BP: (92-124)/(69-84) 124/77 (01/18 0955) SpO2:  [100 %] 100 % (01/18 0955) Weight:  [72.6 kg] 72.6 kg (01/18 0955) Estimated body mass index is 30.23 kg/m as calculated from the following:   Height as of this encounter: 5\' 1"  (1.549 m).   Weight as of this encounter: 72.6 kg.   Intake/Output from previous day: 01/17 0701 - 01/18 0700 In: 1134 [P.O.:620; I.V.:190; IV Piggyback:324] Out: 2300 [Urine:2300] Intake/Output this shift: Total I/O In: -  Out: 275 [Urine:275]  Physical exam the patient is alert and pleasant.  She is paraplegic.  Lab Results: Recent Labs    11/02/21 0359  WBC 5.1  HGB 9.6*  HCT 29.2*  PLT 246   BMET Recent Labs    11/02/21 0359  NA 136  K 4.2  CL 106  CO2 26  GLUCOSE 107*  BUN 18  CREATININE 0.44  CALCIUM 9.1    Studies/Results: No results found.  Assessment/Plan: T10, T11 osteomyelitis, thoracic instability: I have again discussed situation with patient.  We have discussed the various treatment options.  I have answered her questions regarding surgery.  She wants to proceed with a thoracic corpectomy with thoracolumbar instrumentation and fusion.  LOS: 0 days     [name] 11/04/2021, 10:27 AM

## 2021-11-04 NOTE — Transfer of Care (Signed)
Immediate Anesthesia Transfer of Care Note  Patient: Ellen Lane  Procedure(s) Performed: THORACIC ELEVEN,THORACIC TWELVE CORPECTOMY, INTERBODY PROSTHESIS, POSTERIOR INSTRUMENTATION FUSION THORACIC SEVEN- LUMBAR TWO  Patient Location: PACU  Anesthesia Type:General  Level of Consciousness: awake, alert  and oriented  Airway & Oxygen Therapy: Patient Spontanous Breathing  Post-op Assessment: Report given to RN and Post -op Vital signs reviewed and stable  Post vital signs: Reviewed and stable  Last Vitals:  Vitals Value Taken Time  BP 132/108 11/04/21 1824  Temp    Pulse 105 11/04/21 1829  Resp 38 11/04/21 1829  SpO2 100 % 11/04/21 1829  Vitals shown include unvalidated device data.  Last Pain:  Vitals:   11/04/21 0955  TempSrc: Oral         Complications: No notable events documented.

## 2021-11-04 NOTE — Progress Notes (Addendum)
RUE PICC with no blood return: Dressing change due, noted to be at 1cm. Pulled back to 2cm as previously documented. Line appeared to be bulging at insertion site. Pulled additional cm to straighten line. PICC now at 3cm exposed.  tPA instilled.
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
## 2021-11-04 NOTE — Progress Notes (Signed)
Inpatient Rehabilitation Care Coordinator Discharge Note   Patient Details  Name: Ellen Lane MRN: [id number] Date of Birth: Oct 29, 1965   Discharge location: D/c to acute for surgery.  Length of Stay: 24 days  Discharge activity level: wheelchair level Min Assist for transfers and mod I for power wheelchair mobility  Home/community participation: Limited  Patient response KQ:ASUORV Literacy - How often do you need to have someone help you when you read instructions, pamphlets, or other written material from your doctor or pharmacy?: Never  Patient response IF:BPPHKF Isolation - How often do you feel lonely or isolated from those around you?: Never  Services provided included: MD, PT, OT, RD, RN, CM, Pharmacy, TR, Neuropsych, SW  Financial Services:  Charity fundraiser Utilized: Medicaid UHC Medicare (primary)  Choices offered to/list presented to: Yes  Follow-up services arranged:  Patient/Family request agency HH/DME, Home Health, DME Home Health Agency: [facility] for HHPT/OT/SN (wound care and PICC care/IV abx)    DME : [place] for transfer board HH/DME Requested Agency: [facility]  Patient response to transportation need: Is the patient able to respond to transportation needs?: Yes In the past 12 months, has lack of transportation kept you from medical appointments or from getting medications?: No In the past 12 months, has lack of transportation kept you from meetings, work, or from getting things needed for daily living?: No  Comments (or additional information):  Patient/Family verbalized understanding of follow-up arrangements:  Yes  Individual responsible for coordination of the follow-up plan: contact pt# [phone]  Confirmed correct DME delivered: Rana Snare 11/04/2021    Rana Snare

## 2021-11-04 NOTE — Anesthesia Procedure Notes (Signed)
Procedure Name: Intubation Date/Time: 11/04/2021 11:43 AM Performed by: Janace Litten, CRNA Pre-anesthesia Checklist: Patient identified, Emergency Drugs available, Suction available and Patient being monitored Patient Re-evaluated:Patient Re-evaluated prior to induction Oxygen Delivery Method: Circle System Utilized Preoxygenation: Pre-oxygenation with 100% oxygen Induction Type: IV induction Ventilation: Mask ventilation without difficulty Laryngoscope Size: Mac and 3 Grade View: Grade I Tube type: Oral Tube size: 7.0 mm Number of attempts: 1 Airway Equipment and Method: Stylet and Oral airway Placement Confirmation: ETT inserted through vocal cords under direct vision, positive ETCO2 and breath sounds checked- equal and bilateral Secured at: 22 cm Tube secured with: Tape Dental Injury: Teeth and Oropharynx as per pre-operative assessment

## 2021-11-05 ENCOUNTER — Other Ambulatory Visit (HOSPITAL_COMMUNITY): Payer: Self-pay

## 2021-11-05 DIAGNOSIS — Z981 Arthrodesis status: Secondary | ICD-10-CM

## 2021-11-05 DIAGNOSIS — M4620 Osteomyelitis of vertebra, site unspecified: Secondary | ICD-10-CM

## 2021-11-05 DIAGNOSIS — B20 Human immunodeficiency virus [HIV] disease: Secondary | ICD-10-CM

## 2021-11-05 LAB — GLUCOSE, CAPILLARY
Glucose-Capillary: 115 mg/dL — ABNORMAL HIGH (ref 70–99)
Glucose-Capillary: 120 mg/dL — ABNORMAL HIGH (ref 70–99)
Glucose-Capillary: 122 mg/dL — ABNORMAL HIGH (ref 70–99)
Glucose-Capillary: 124 mg/dL — ABNORMAL HIGH (ref 70–99)
Glucose-Capillary: 139 mg/dL — ABNORMAL HIGH (ref 70–99)
Glucose-Capillary: 147 mg/dL — ABNORMAL HIGH (ref 70–99)
Glucose-Capillary: 180 mg/dL — ABNORMAL HIGH (ref 70–99)

## 2021-11-05 LAB — BODY FLUID CULTURE W GRAM STAIN

## 2021-11-05 LAB — HEMOGLOBIN A1C
Hgb A1c MFr Bld: 5.8 % — ABNORMAL HIGH (ref 4.8–5.6)
Mean Plasma Glucose: 119.76 mg/dL

## 2021-11-05 LAB — BASIC METABOLIC PANEL
Anion gap: 9 (ref 5–15)
BUN: 13 mg/dL (ref 6–20)
CO2: 23 mmol/L (ref 22–32)
Calcium: 9 mg/dL (ref 8.9–10.3)
Chloride: 107 mmol/L (ref 98–111)
Creatinine, Ser: 0.54 mg/dL (ref 0.44–1.00)
GFR, Estimated: >60 mL/min (ref >60)
Glucose, Bld: 134 mg/dL — ABNORMAL HIGH (ref 70–99)
Potassium: 4.2 mmol/L (ref 3.5–5.1)
Sodium: 139 mmol/L (ref 135–145)

## 2021-11-05 LAB — CBC
HCT: 24.6 % — ABNORMAL LOW (ref 36.0–46.0)
Hemoglobin: 8.4 g/dL — ABNORMAL LOW (ref 12.0–15.0)
MCH: 28.1 pg (ref 26.0–34.0)
MCHC: 34.1 g/dL (ref 30.0–36.0)
MCV: 82.3 fL (ref 80.0–100.0)
Platelets: 189 10*3/uL (ref 150–400)
RBC: 2.99 MIL/uL — ABNORMAL LOW (ref 3.87–5.11)
RDW: 14.7 % (ref 11.5–15.5)
WBC: 10.1 10*3/uL (ref 4.0–10.5)
nRBC: 0 % (ref 0.0–0.2)
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
## 2021-11-05 NOTE — Progress Notes (Signed)
Subjective: The patient is alert and pleasant.  She denies pain.  She looks well.  Objective: Vital signs in last 24 hours: Temp:  [97.7 F (36.5 C)-98.4 F (36.9 C)] 98.4 F (36.9 C) (01/19 0737) Pulse Rate:  [66-109] 102 (01/19 0737) Resp:  [14-26] 14 (01/19 0737) BP: (81-152)/(59-108) 81/59 (01/19 0737) SpO2:  [97 %-100 %] 99 % (01/19 0737) Weight:  [72.6 kg] 72.6 kg (01/18 0955) Estimated body mass index is 30.23 kg/m as calculated from the following:   Height as of this encounter: 5\' 1"  (1.549 m).   Weight as of this encounter: 72.6 kg.   Intake/Output from previous day: 01/18 0701 - 01/19 0700 In: 5840.6 [P.O.:250; I.V.:4005; Blood:630; IV Piggyback:955.6] Out: 2930 [Urine:980; Drains:350; Blood:1600] Intake/Output this shift: No intake/output data recorded.  Physical exam the patient is alert and pleasant.  She remains paraplegic.  Lab Results: Recent Labs    11/04/21 1603 11/05/21 0409  WBC  --  10.1  HGB 9.9* 8.4*  HCT 29.0* 24.6*  PLT  --  189   BMET Recent Labs    11/04/21 1603 11/05/21 0409  NA 139 139  K 4.1 4.2  CL 105 107  CO2  --  23  GLUCOSE 126* 134*  BUN 11 13  CREATININE 0.30* 0.54  CALCIUM  --  9.0    Studies/Results: DG THORACOLUMABAR SPINE  Result Date: 11/04/2021 CLINICAL DATA:  T7-L2 posterior fusion. T11-T12 corpectomy and interbody prosthesis. EXAM: THORACOLUMBAR SPINE 1V COMPARISON:  Thoracic spine CT 11/02/2021. FINDINGS: Thoracic spine. Seven low resolution intraoperative spot views of the thoracic spine were obtained. Thoracolumbar fusion hardware present. T11-T12 corpectomy and interbody prosthesis. No fracture visible on the limited views. Total fluoroscopy time: 1 minute 49 seconds Total radiation dose: 36.52 micro Gy IMPRESSION: 1. Intraoperative thoracic spine as above. Electronically Signed   By: Ronney Asters M.D.   On: 11/04/2021 18:53   DG C-Arm 1-60 Min-No Report  Result Date: 11/04/2021 Fluoroscopy was utilized by  the requesting physician.  No radiographic interpretation.   DG C-Arm 1-60 Min-No Report  Result Date: 11/04/2021 Fluoroscopy was utilized by the requesting physician.  No radiographic interpretation.   DG C-Arm 1-60 Min-No Report  Result Date: 11/04/2021 Fluoroscopy was utilized by the requesting physician.  No radiographic interpretation.   DG C-Arm 1-60 Min-No Report  Result Date: 11/04/2021 Fluoroscopy was utilized by the requesting physician.  No radiographic interpretation.   DG C-Arm 1-60 Min-No Report  Result Date: 11/04/2021 Fluoroscopy was utilized by the requesting physician.  No radiographic interpretation.    Assessment/Plan: Postop day #1: The patient is doing well.  We will mobilize her with PT and OT.  She needs to wear her TLSO.  Hopefully we can send her home in a day or 2.  LOS: 1 day     [name] 11/05/2021, 7:47 AM     Patient ID: Ellen Lane, female   DOB: [birth year], 56 y.o.   MRN: [id number]
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
## 2021-11-05 NOTE — Progress Notes (Signed)
°  Transition of Care [facility]) Screening Note   Patient Details  Name: Ellen Lane Date of Birth: 1966/05/25   Transition of Care [facility]) CM/SW Contact:    Vinie Sill, LCSW Phone Number: 11/05/2021, 10:48 AM    Transition of Care Department [facility]) has reviewed patient and no TOC needs have been identified at this time. We will continue to monitor patient advancement through interdisciplinary progression rounds. If new patient transition needs arise, please place a TOC consult.
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
## 2021-11-05 NOTE — Progress Notes (Signed)
Called into room by PT/OT for pt complaining of headache. Pt's blood pressure 181/82 when sat up by therapies, with increase in headache at that time. Tylenol given at 0615, pt denies hx of HTN. Pt laid back down by therapies, repeat bp 135/78.   Justice Rocher, RN

## 2021-11-05 NOTE — Progress Notes (Signed)
Pt arrived to 4NP01 from PACU no belongings with patient, complaining of no pain, full assessment documented, educated on call bell and left within reach

## 2021-11-05 NOTE — Progress Notes (Signed)
Inpatient Rehabilitation Admissions Coordinator   Patient readmitted to acute hospital for surgery. Plans were for direct discharge home after postoperative course. Case discussed with Dr Dagoberto Ligas and I will follow patient's  progress to assess if she has a decline in function postoperatively to assess most appropriate rehab venue.  Danne Baxter, RN, MSN Rehab Admissions Coordinator [phone] 11/05/2021 12:42 PM

## 2021-11-06 LAB — GLUCOSE, CAPILLARY
Glucose-Capillary: 106 mg/dL — ABNORMAL HIGH (ref 70–99)
Glucose-Capillary: 133 mg/dL — ABNORMAL HIGH (ref 70–99)
Glucose-Capillary: 161 mg/dL — ABNORMAL HIGH (ref 70–99)
Glucose-Capillary: 189 mg/dL — ABNORMAL HIGH (ref 70–99)
Glucose-Capillary: 65 mg/dL — ABNORMAL LOW (ref 70–99)
Glucose-Capillary: 96 mg/dL (ref 70–99)

## 2021-11-06 LAB — SURGICAL PATHOLOGY

## 2021-11-06 MED ORDER — FERROUS SULFATE 325 (65 FE) MG PO TABS
325.0000 mg | ORAL_TABLET | Freq: Two times a day (BID) | ORAL | Status: DC
  Administered 2021-11-06 – 2021-11-18 (×25): 325 mg via ORAL
  Filled 2021-11-06 (×25): qty 1

## 2021-11-06 NOTE — Care Management Important Message (Signed)
Important Message  Patient Details  Name: Ellen Lane MRN: [id number] Date of Birth: 1965/11/06   Medicare Important Message Given:  Yes     Hannah Beat 11/06/2021, 1:22 PM

## 2021-11-06 NOTE — Progress Notes (Addendum)
The nurse tech and I gave the patient a bed bath, I changed dressing with ABD pads and gauze and placed new sacral patch. The tech also got her blood sugar and it was 106 (no coverage needed at this time). Patient said she would go home with home health because she would get antibiotics at home.

## 2021-11-06 NOTE — Progress Notes (Signed)
PHARMACY CONSULT NOTE FOR:  OUTPATIENT  PARENTERAL ANTIBIOTIC THERAPY (OPAT)  Indication: vertebral osteomyelitis  Regimen:  daptomycin 600 mg IV q24h cefepime 2 g IV q8h  End date: 11/22/21  IV antibiotic discharge orders are pended. To discharging provider:  please sign these orders via discharge navigator,  Select New Orders & click on the button choice - Manage This Unsigned Work.     Thank you for allowing pharmacy to be a part of this patient's care.  Eliseo Gum, PharmD Candidate  11/06/2021 1:05 PM

## 2021-11-06 NOTE — TOC Progression Note (Addendum)
Transition of Care [facility]) - Progression Note    Patient Details  Name: Ellen Lane MRN: [id number] Date of Birth: Oct 01, 1966  Transition of Care [facility]) CM/SW Contact  Angelita Ingles, RN Phone Number:[phone]  11/06/2021, 3:38 PM  Clinical Narrative:    [facility] consulted for [facility] needs. CM at bedside to offer choice for [facility]. [facility] referral has been accepted by Corene Cornea with [facility]. Start of service can not begin until Monday. MD will need to enter [facility] orders. Pam with [facility] is following for home infusion and has been made aware. TOC will continue to follow.  1650 CM at bedside to make patient aware that [facility] has been set up with [facility] and start of service will begin on Monday. Patient states that she does have 2 caregivers (mother and sister)that will be able to change the daily dressing on her back and states that she can get supplies ordered as long as she has script. RN at bedside states that they will be able to supply patient with some supplies. No other needs noted at this time .        Expected Discharge Plan and Services                                                 Social Determinants of Health (SDOH) Interventions    Readmission Risk Interventions No flowsheet data found.

## 2021-11-06 NOTE — Progress Notes (Signed)
Patient would like for home health to change dressing on her back during visits. Please put in [place] Discharge Orders.
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
## 2021-11-06 NOTE — H&P (Signed)
Subjective: The patient is alert and pleasant.  She wants to go home tomorrow after she is contacted her home health aide.  Objective: Vital signs in last 24 hours: Temp:  [98.2 F (36.8 C)-100.1 F (37.8 C)] 99.2 F (37.3 C) (01/20 0328) Pulse Rate:  [69-113] 108 (01/20 0328) Resp:  [11-17] 15 (01/19 1544) BP: (88-181)/(58-84) 93/72 (01/20 0328) SpO2:  [98 %-100 %] 100 % (01/20 0328) Estimated body mass index is 30.23 kg/m as calculated from the following:   Height as of this encounter: 5\' 1"  (1.549 m).   Weight as of this encounter: 72.6 kg.   Intake/Output from previous day: 01/19 0701 - 01/20 0700 In: 440 [P.O.:440] Out: 1514 [Urine:889; Drains:625] Intake/Output this shift: No intake/output data recorded.  Physical exam patient is alert and pleasant.  Dressing has a small bloodstain.  She is paraplegic.  Lab Results: Recent Labs    11/04/21 1603 11/05/21 0409  WBC  --  10.1  HGB 9.9* 8.4*  HCT 29.0* 24.6*  PLT  --  189   BMET Recent Labs    11/04/21 1603 11/05/21 0409  NA 139 139  K 4.1 4.2  CL 105 107  CO2  --  23  GLUCOSE 126* 134*  BUN 11 13  CREATININE 0.30* 0.54  CALCIUM  --  9.0    Studies/Results: DG THORACOLUMABAR SPINE  Result Date: 11/04/2021 CLINICAL DATA:  T7-L2 posterior fusion. T11-T12 corpectomy and interbody prosthesis. EXAM: THORACOLUMBAR SPINE 1V COMPARISON:  Thoracic spine CT 11/02/2021. FINDINGS: Thoracic spine. Seven low resolution intraoperative spot views of the thoracic spine were obtained. Thoracolumbar fusion hardware present. T11-T12 corpectomy and interbody prosthesis. No fracture visible on the limited views. Total fluoroscopy time: 1 minute 49 seconds Total radiation dose: 36.52 micro Gy IMPRESSION: 1. Intraoperative thoracic spine as above. Electronically Signed   By: Ronney Asters M.D.   On: 11/04/2021 18:53   DG C-Arm 1-60 Min-No Report  Result Date: 11/04/2021 Fluoroscopy was utilized by the requesting physician.  No  radiographic interpretation.   DG C-Arm 1-60 Min-No Report  Result Date: 11/04/2021 Fluoroscopy was utilized by the requesting physician.  No radiographic interpretation.   DG C-Arm 1-60 Min-No Report  Result Date: 11/04/2021 Fluoroscopy was utilized by the requesting physician.  No radiographic interpretation.   DG C-Arm 1-60 Min-No Report  Result Date: 11/04/2021 Fluoroscopy was utilized by the requesting physician.  No radiographic interpretation.   DG C-Arm 1-60 Min-No Report  Result Date: 11/04/2021 Fluoroscopy was utilized by the requesting physician.  No radiographic interpretation.    Assessment/Plan: Postop day #2: The patient seems to be doing well.  We will DC her Hemovac.  Her intraoperative cultures are negative so far.  The plan is to send her home tomorrow.  Acute blood loss anemia: Her hemoglobin was 8.4 yesterday.  I will add iron.  LOS: 2 days     [name] 11/06/2021, 7:54 AM     Patient ID: Ellen Lane, female   DOB: [birth year], 56 y.o.   MRN: [id number]
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
## 2021-11-06 NOTE — Progress Notes (Signed)
Inpatient Rehabilitation Admissions Coordinator   Noted patient preference to return home with [facility] as planned postoperatively. We will sign off at this time.  Danne Baxter, RN, MSN Rehab Admissions Coordinator [phone] 11/06/2021 12:26 PM

## 2021-11-07 LAB — GLUCOSE, CAPILLARY
Glucose-Capillary: 103 mg/dL — ABNORMAL HIGH (ref 70–99)
Glucose-Capillary: 107 mg/dL — ABNORMAL HIGH (ref 70–99)
Glucose-Capillary: 115 mg/dL — ABNORMAL HIGH (ref 70–99)
Glucose-Capillary: 136 mg/dL — ABNORMAL HIGH (ref 70–99)
Glucose-Capillary: 87 mg/dL (ref 70–99)
Glucose-Capillary: 94 mg/dL (ref 70–99)

## 2021-11-07 LAB — HIV-1 RNA QUANT-NO REFLEX-BLD
HIV 1 RNA Quant: <20 copies/mL
LOG10 HIV-1 RNA: UNDETERMINED log10copy/mL

## 2021-11-07 NOTE — Progress Notes (Signed)
Dr Christella Noa at bedside for incision check.  Copious amount of serous fluid discharging from lower incision site.  DC cancelled.  Honeycomb dressing applied per Dr Christella Noa and reinforced with ABD pads

## 2021-11-07 NOTE — TOC Progression Note (Signed)
Transition of Care [facility]) - Progression Note    Patient Details  Name: Ellen Lane MRN: [id number] Date of Birth: 01/24/1966  [facility]) CM/SW Contact  Zenon Mayo, RN Phone Number: 11/07/2021, 4:01 PM  Clinical Narrative:    Patient has a lot of drainage from site so discharge cancelled and PTAR canceled.   Pam  with [employer] states the medication is being delivered to mother's address so she can take it to patient's home.  Pam has done the teaching with patient yesterday and she is capable of doing her iv abx infusion til [facility] is able to get there when she is medically ready to go.  TOC will continue to follow for dc needs.         Expected Discharge Plan and Services                                                 Social Determinants of Health (SDOH) Interventions    Readmission Risk Interventions No flowsheet data found.

## 2021-11-07 NOTE — Progress Notes (Signed)
In and out cath done. 600 ml clear amber urine without sediments obtained. Patient also had a wet bed.

## 2021-11-07 NOTE — Progress Notes (Signed)
Patient ID: Ellen Lane, female   DOB: 27-Nov-1965, 56 y.o.   MRN: [id number] Dressing changed, prodigious drainage from inferior portion of the wound. Will oversew.

## 2021-11-07 NOTE — Progress Notes (Signed)
Patient ID: Ellen Lane, female   DOB: 11/14/1965, 56 y.o.   MRN: [id number] BP 106/78 (BP Location: Left Arm)    Pulse 100    Temp 98.7 F (37.1 C) (Oral)    Resp 15    Ht 5\' 1"  (1.549 m)    Wt 72.6 kg    LMP 01/08/2015    SpO2 100%    BMI 30.23 kg/m  Alert and oriented x 4, speech is clear and fluent Moving all extremities well Dressing dry Home services cannot start until Monday,

## 2021-11-08 LAB — TYPE AND SCREEN
ABO/RH(D): O POS
Antibody Screen: NEGATIVE
Unit division: 0
Unit division: 0
Unit division: 0
Unit division: 0

## 2021-11-08 LAB — BPAM RBC
Blood Product Expiration Date: 202302132359
Blood Product Expiration Date: 202302132359
Blood Product Expiration Date: 202302132359
Blood Product Expiration Date: 202302142359
ISSUE DATE / TIME: 202301181043
ISSUE DATE / TIME: 202301181043
ISSUE DATE / TIME: 202301181043
ISSUE DATE / TIME: 202301181043
Unit Type and Rh: 5100
Unit Type and Rh: 5100
Unit Type and Rh: 5100
Unit Type and Rh: 5100

## 2021-11-08 LAB — GLUCOSE, CAPILLARY
Glucose-Capillary: 112 mg/dL — ABNORMAL HIGH (ref 70–99)
Glucose-Capillary: 111 mg/dL — ABNORMAL HIGH (ref 70–99)
Glucose-Capillary: 108 mg/dL — ABNORMAL HIGH (ref 70–99)
Glucose-Capillary: 95 mg/dL (ref 70–99)
Glucose-Capillary: 137 mg/dL — ABNORMAL HIGH (ref 70–99)
Glucose-Capillary: 121 mg/dL — ABNORMAL HIGH (ref 70–99)

## 2021-11-08 MED ORDER — INSULIN ASPART 100 UNIT/ML IJ SOLN
0.0000 [IU] | Freq: Three times a day (TID) | INTRAMUSCULAR | Status: DC
  Administered 2021-11-08: 22:00:00 3 [IU] via SUBCUTANEOUS
  Administered 2021-11-10: 21:00:00 4 [IU] via SUBCUTANEOUS
  Administered 2021-11-10 (×2): 3 [IU] via SUBCUTANEOUS
  Administered 2021-11-11 – 2021-11-12 (×2): 4 [IU] via SUBCUTANEOUS
  Administered 2021-11-12 – 2021-11-14 (×5): 3 [IU] via SUBCUTANEOUS
  Administered 2021-11-14: 4 [IU] via SUBCUTANEOUS
  Administered 2021-11-14: 3 [IU] via SUBCUTANEOUS
  Administered 2021-11-15: 4 [IU] via SUBCUTANEOUS
  Administered 2021-11-15: 3 [IU] via SUBCUTANEOUS
  Administered 2021-11-15: 4 [IU] via SUBCUTANEOUS
  Administered 2021-11-16: 7 [IU] via SUBCUTANEOUS
  Administered 2021-11-16: 3 [IU] via SUBCUTANEOUS
  Administered 2021-11-16: 4 [IU] via SUBCUTANEOUS
  Administered 2021-11-17 (×2): 3 [IU] via SUBCUTANEOUS
  Administered 2021-11-18 (×2): 4 [IU] via SUBCUTANEOUS

## 2021-11-08 NOTE — Progress Notes (Signed)
Patient ID: Ellen Lane, female   DOB: [birth year], 56 y.o.   MRN: [id number] BP 118/81 (BP Location: Left Arm)    Pulse 88    Temp 98.1 F (36.7 C) (Oral)    Resp 15    Ht 5\' 1"  (1.549 m)    Wt 72.6 kg    LMP 01/08/2015    SpO2 100%    BMI 30.23 kg/m  Alert and oriented x 4 Wound continues to drain serous fluid. Not purulent nor malodorous Is not febrile. May need wound exploration in the OR.

## 2021-11-09 ENCOUNTER — Encounter (HOSPITAL_COMMUNITY)
Admission: RE | Disposition: A | Discharge status: Home Health | Payer: Self-pay | Source: Home / Self Care | Attending: Neurosurgery

## 2021-11-09 ENCOUNTER — Inpatient Hospital Stay (HOSPITAL_COMMUNITY): Payer: Medicare Other

## 2021-11-09 ENCOUNTER — Inpatient Hospital Stay (HOSPITAL_COMMUNITY): Payer: Medicare Other | Admitting: Certified Registered Nurse Anesthetist

## 2021-11-09 ENCOUNTER — Encounter (HOSPITAL_COMMUNITY): Payer: Self-pay | Admitting: Neurosurgery

## 2021-11-09 ENCOUNTER — Other Ambulatory Visit: Payer: Self-pay

## 2021-11-09 DIAGNOSIS — M4624 Osteomyelitis of vertebra, thoracic region: Secondary | ICD-10-CM | POA: Diagnosis present

## 2021-11-09 HISTORY — PX: PLACEMENT OF LUMBAR DRAIN: SHX6028

## 2021-11-09 LAB — CBC
HCT: 19.9 % — ABNORMAL LOW (ref 36.0–46.0)
Hemoglobin: 6.5 g/dL — CL (ref 12.0–15.0)
MCH: 28.1 pg (ref 26.0–34.0)
MCHC: 32.7 g/dL (ref 30.0–36.0)
MCV: 86.1 fL (ref 80.0–100.0)
Platelets: 219 10*3/uL (ref 150–400)
RBC: 2.31 MIL/uL — ABNORMAL LOW (ref 3.87–5.11)
RDW: 15.9 % — ABNORMAL HIGH (ref 11.5–15.5)
WBC: 6.1 10*3/uL (ref 4.0–10.5)
nRBC: 0 % (ref 0.0–0.2)

## 2021-11-09 LAB — AEROBIC/ANAEROBIC CULTURE W GRAM STAIN (SURGICAL/DEEP WOUND)
Culture: NO GROWTH
Culture: NO GROWTH
Gram Stain: NONE SEEN

## 2021-11-09 LAB — COMPREHENSIVE METABOLIC PANEL
ALT: 12 U/L (ref 0–44)
AST: 12 U/L — ABNORMAL LOW (ref 15–41)
Albumin: 2.2 g/dL — ABNORMAL LOW (ref 3.5–5.0)
Alkaline Phosphatase: 90 U/L (ref 38–126)
Anion gap: 9 (ref 5–15)
BUN: 8 mg/dL (ref 6–20)
CO2: 23 mmol/L (ref 22–32)
Calcium: 8.8 mg/dL — ABNORMAL LOW (ref 8.9–10.3)
Chloride: 104 mmol/L (ref 98–111)
Creatinine, Ser: 0.41 mg/dL — ABNORMAL LOW (ref 0.44–1.00)
GFR, Estimated: >60 mL/min (ref >60)
Glucose, Bld: 97 mg/dL (ref 70–99)
Potassium: 3.7 mmol/L (ref 3.5–5.1)
Sodium: 136 mmol/L (ref 135–145)
Total Bilirubin: 0.2 mg/dL — ABNORMAL LOW (ref 0.3–1.2)
Total Protein: 5.6 g/dL — ABNORMAL LOW (ref 6.5–8.1)

## 2021-11-09 LAB — GLUCOSE, CAPILLARY
Glucose-Capillary: 96 mg/dL (ref 70–99)
Glucose-Capillary: 77 mg/dL (ref 70–99)
Glucose-Capillary: 124 mg/dL — ABNORMAL HIGH (ref 70–99)
Glucose-Capillary: 69 mg/dL — ABNORMAL LOW (ref 70–99)
Glucose-Capillary: 89 mg/dL (ref 70–99)
Glucose-Capillary: 92 mg/dL (ref 70–99)

## 2021-11-09 LAB — PREPARE RBC (CROSSMATCH)

## 2021-11-09 LAB — CK: Total CK: 60 U/L (ref 38–234)

## 2021-11-09 LAB — C-REACTIVE PROTEIN: CRP: 4 mg/dL — ABNORMAL HIGH (ref <1.0)

## 2021-11-09 SURGERY — PLACEMENT OF LUMBAR DRAIN
Anesthesia: General | Site: Back

## 2021-11-09 MED ORDER — 0.9 % SODIUM CHLORIDE (POUR BTL) OPTIME
TOPICAL | Status: DC | PRN
  Administered 2021-11-09: 1000 mL

## 2021-11-09 MED ORDER — MENTHOL 3 MG MT LOZG: 1.0000 | LOZENGE | OROMUCOSAL | Status: DC | PRN

## 2021-11-09 MED ORDER — BACITRACIN ZINC 500 UNIT/GM EX OINT
TOPICAL_OINTMENT | CUTANEOUS | Status: AC
  Filled 2021-11-09: qty 28.35

## 2021-11-09 MED ORDER — SUGAMMADEX SODIUM 200 MG/2ML IV SOLN
INTRAVENOUS | Status: DC | PRN
  Administered 2021-11-09: 200 mg via INTRAVENOUS

## 2021-11-09 MED ORDER — ONDANSETRON HCL 4 MG/2ML IJ SOLN
INTRAMUSCULAR | Status: DC | PRN
  Administered 2021-11-09: 4 mg via INTRAVENOUS

## 2021-11-09 MED ORDER — OXYCODONE HCL 5 MG PO TABS: 10.0000 mg | ORAL_TABLET | ORAL | Status: DC | PRN

## 2021-11-09 MED ORDER — BUPIVACAINE HCL (PF) 0.5 % IJ SOLN
INTRAMUSCULAR | Status: AC
  Filled 2021-11-09: qty 30

## 2021-11-09 MED ORDER — PROPOFOL 10 MG/ML IV BOLUS
INTRAVENOUS | Status: AC
  Filled 2021-11-09: qty 20

## 2021-11-09 MED ORDER — BISACODYL 10 MG RE SUPP: 10.0000 mg | Freq: Every day | RECTAL | Status: DC | PRN

## 2021-11-09 MED ORDER — PROPOFOL 10 MG/ML IV BOLUS
INTRAVENOUS | Status: DC | PRN
  Administered 2021-11-09: 80 mg via INTRAVENOUS

## 2021-11-09 MED ORDER — LIDOCAINE 2% (20 MG/ML) 5 ML SYRINGE
INTRAMUSCULAR | Status: AC
  Filled 2021-11-09: qty 15

## 2021-11-09 MED ORDER — ROCURONIUM BROMIDE 10 MG/ML (PF) SYRINGE
PREFILLED_SYRINGE | INTRAVENOUS | Status: AC
  Filled 2021-11-09: qty 10

## 2021-11-09 MED ORDER — LIDOCAINE-EPINEPHRINE 1 %-1:100000 IJ SOLN
INTRAMUSCULAR | Status: AC
  Filled 2021-11-09: qty 1

## 2021-11-09 MED ORDER — HEMOSTATIC AGENTS (NO CHARGE) OPTIME
TOPICAL | Status: DC | PRN
  Administered 2021-11-09: 1 via TOPICAL

## 2021-11-09 MED ORDER — CHLORHEXIDINE GLUCONATE 0.12 % MT SOLN
OROMUCOSAL | Status: AC
  Administered 2021-11-09: 15 mL via OROMUCOSAL
  Filled 2021-11-09: qty 15

## 2021-11-09 MED ORDER — MORPHINE SULFATE (PF) 4 MG/ML IV SOLN: 4.0000 mg | INTRAVENOUS | Status: DC | PRN

## 2021-11-09 MED ORDER — ACETAMINOPHEN 500 MG PO TABS
1000.0000 mg | ORAL_TABLET | Freq: Four times a day (QID) | ORAL | Status: AC
  Administered 2021-11-09 – 2021-11-10 (×3): 1000 mg via ORAL
  Filled 2021-11-09 (×4): qty 2

## 2021-11-09 MED ORDER — CHLORHEXIDINE GLUCONATE 0.12 % MT SOLN: 15.0000 mL | Freq: Once | OROMUCOSAL | Status: AC

## 2021-11-09 MED ORDER — SODIUM CHLORIDE 0.9% FLUSH
3.0000 mL | INTRAVENOUS | Status: DC | PRN
  Administered 2021-11-10: 10:00:00 3 mL via INTRAVENOUS

## 2021-11-09 MED ORDER — MIDAZOLAM HCL 2 MG/2ML IJ SOLN
INTRAMUSCULAR | Status: AC
  Filled 2021-11-09: qty 2

## 2021-11-09 MED ORDER — ONDANSETRON HCL 4 MG PO TABS: 4.0000 mg | ORAL_TABLET | Freq: Four times a day (QID) | ORAL | Status: DC | PRN

## 2021-11-09 MED ORDER — SODIUM CHLORIDE 0.9% FLUSH
3.0000 mL | Freq: Two times a day (BID) | INTRAVENOUS | Status: DC
  Administered 2021-11-09 – 2021-11-16 (×7): 3 mL via INTRAVENOUS

## 2021-11-09 MED ORDER — THROMBIN 20000 UNITS EX SOLR
CUTANEOUS | Status: DC | PRN
  Administered 2021-11-09: 20 mL via TOPICAL

## 2021-11-09 MED ORDER — DEXAMETHASONE SODIUM PHOSPHATE 10 MG/ML IJ SOLN
INTRAMUSCULAR | Status: DC | PRN
  Administered 2021-11-09: 5 mg via INTRAVENOUS

## 2021-11-09 MED ORDER — ACETAMINOPHEN 325 MG PO TABS
650.0000 mg | ORAL_TABLET | ORAL | Status: DC | PRN
  Administered 2021-11-11 – 2021-11-18 (×15): 650 mg via ORAL
  Filled 2021-11-09 (×16): qty 2

## 2021-11-09 MED ORDER — ORAL CARE MOUTH RINSE: 15.0000 mL | Freq: Once | OROMUCOSAL | Status: AC

## 2021-11-09 MED ORDER — CYCLOBENZAPRINE HCL 10 MG PO TABS: 10.0000 mg | ORAL_TABLET | Freq: Three times a day (TID) | ORAL | Status: DC | PRN

## 2021-11-09 MED ORDER — OXYCODONE HCL 5 MG PO TABS: 5.0000 mg | ORAL_TABLET | ORAL | Status: DC | PRN

## 2021-11-09 MED ORDER — DEXTROSE 50 % IV SOLN
INTRAVENOUS | Status: AC
  Filled 2021-11-09: qty 50

## 2021-11-09 MED ORDER — FENTANYL CITRATE (PF) 250 MCG/5ML IJ SOLN
INTRAMUSCULAR | Status: AC
  Filled 2021-11-09: qty 5

## 2021-11-09 MED ORDER — BUPIVACAINE-EPINEPHRINE 0.5% -1:200000 IJ SOLN
INTRAMUSCULAR | Status: AC
  Filled 2021-11-09: qty 1

## 2021-11-09 MED ORDER — PHENYLEPHRINE HCL-NACL 20-0.9 MG/250ML-% IV SOLN
INTRAVENOUS | Status: DC | PRN
  Administered 2021-11-09: 25 ug/min via INTRAVENOUS

## 2021-11-09 MED ORDER — DOCUSATE SODIUM 100 MG PO CAPS: 100.0000 mg | ORAL_CAPSULE | Freq: Two times a day (BID) | ORAL | Status: DC

## 2021-11-09 MED ORDER — ROCURONIUM BROMIDE 10 MG/ML (PF) SYRINGE
PREFILLED_SYRINGE | INTRAVENOUS | Status: DC | PRN
Start: 2021-11-09 — End: 2021-11-09
  Administered 2021-11-09: 30 mg via INTRAVENOUS
  Administered 2021-11-09: 20 mg via INTRAVENOUS
  Administered 2021-11-09: 50 mg via INTRAVENOUS

## 2021-11-09 MED ORDER — ONDANSETRON HCL 4 MG/2ML IJ SOLN
INTRAMUSCULAR | Status: AC
  Filled 2021-11-09: qty 2

## 2021-11-09 MED ORDER — OXYCODONE HCL 5 MG/5ML PO SOLN: 5.0000 mg | Freq: Once | ORAL | Status: DC | PRN

## 2021-11-09 MED ORDER — LIDOCAINE 2% (20 MG/ML) 5 ML SYRINGE
INTRAMUSCULAR | Status: DC | PRN
Start: 2021-11-09 — End: 2021-11-09
  Administered 2021-11-09: 60 mg via INTRAVENOUS

## 2021-11-09 MED ORDER — SODIUM CHLORIDE 0.9 % IV SOLN: 250.0000 mL | INTRAVENOUS | Status: DC

## 2021-11-09 MED ORDER — THROMBIN 5000 UNITS EX SOLR
CUTANEOUS | Status: AC
  Filled 2021-11-09: qty 5000

## 2021-11-09 MED ORDER — ONDANSETRON HCL 4 MG/2ML IJ SOLN: 4.0000 mg | Freq: Four times a day (QID) | INTRAMUSCULAR | Status: DC | PRN

## 2021-11-09 MED ORDER — BACITRACIN ZINC 500 UNIT/GM EX OINT
TOPICAL_OINTMENT | CUTANEOUS | Status: DC | PRN
  Administered 2021-11-09: 1 via TOPICAL

## 2021-11-09 MED ORDER — MIDAZOLAM HCL 2 MG/2ML IJ SOLN
INTRAMUSCULAR | Status: DC | PRN
  Administered 2021-11-09: 2 mg via INTRAVENOUS

## 2021-11-09 MED ORDER — ACETAMINOPHEN 650 MG RE SUPP: 650.0000 mg | RECTAL | Status: DC | PRN

## 2021-11-09 MED ORDER — LACTATED RINGERS IV SOLN: INTRAVENOUS | Status: DC

## 2021-11-09 MED ORDER — OXYCODONE HCL 5 MG PO TABS: 5.0000 mg | ORAL_TABLET | Freq: Once | ORAL | Status: DC | PRN

## 2021-11-09 MED ORDER — DEXAMETHASONE SODIUM PHOSPHATE 10 MG/ML IJ SOLN
INTRAMUSCULAR | Status: AC
  Filled 2021-11-09: qty 1

## 2021-11-09 MED ORDER — PHENYLEPHRINE 40 MCG/ML (10ML) SYRINGE FOR IV PUSH (FOR BLOOD PRESSURE SUPPORT)
PREFILLED_SYRINGE | INTRAVENOUS | Status: DC | PRN
Start: 2021-11-09 — End: 2021-11-09
  Administered 2021-11-09 (×2): 80 ug via INTRAVENOUS
  Administered 2021-11-09: 160 ug via INTRAVENOUS

## 2021-11-09 MED ORDER — THROMBIN 20000 UNITS EX SOLR
CUTANEOUS | Status: AC
  Filled 2021-11-09: qty 20000

## 2021-11-09 MED ORDER — PHENOL 1.4 % MT LIQD: 1.0000 | OROMUCOSAL | Status: DC | PRN

## 2021-11-09 MED ORDER — PHENYLEPHRINE 40 MCG/ML (10ML) SYRINGE FOR IV PUSH (FOR BLOOD PRESSURE SUPPORT)
PREFILLED_SYRINGE | INTRAVENOUS | Status: AC
  Filled 2021-11-09: qty 10

## 2021-11-09 MED ORDER — ZOLPIDEM TARTRATE 5 MG PO TABS: 5.0000 mg | ORAL_TABLET | Freq: Every evening | ORAL | Status: DC | PRN

## 2021-11-09 MED ORDER — FENTANYL CITRATE (PF) 250 MCG/5ML IJ SOLN
INTRAMUSCULAR | Status: DC | PRN
Start: 2021-11-09 — End: 2021-11-09
  Administered 2021-11-09 (×2): 50 ug via INTRAVENOUS
  Administered 2021-11-09: 25 ug via INTRAVENOUS

## 2021-11-09 MED ORDER — FENTANYL CITRATE (PF) 100 MCG/2ML IJ SOLN: 25.0000 ug | INTRAMUSCULAR | Status: DC | PRN

## 2021-11-09 SURGICAL SUPPLY — 77 items
APL SKNCLS STERI-STRIP NONHPOA (GAUZE/BANDAGES/DRESSINGS) ×1
BAG COUNTER SPONGE SURGICOUNT (BAG) ×4 IMPLANT
BAG DRN CSF CATH SYS STRL MNTR (MISCELLANEOUS) ×1
BAG SPNG CNTER NS LX DISP (BAG) ×2
BALL CTTN LRG ABS STRL LF (GAUZE/BANDAGES/DRESSINGS)
BAND INSRT 18 STRL LF DISP RB (MISCELLANEOUS)
BAND RUBBER #18 3X1/16 STRL (MISCELLANEOUS) IMPLANT
BENZOIN TINCTURE PRP APPL 2/3 (GAUZE/BANDAGES/DRESSINGS) ×3 IMPLANT
BLADE CLIPPER SURG (BLADE) IMPLANT
BLADE SURG 11 STRL SS (BLADE) IMPLANT
BLADE ULTRA TIP 2M (BLADE) IMPLANT
BUR PRECISION FLUTE 6.0 (BURR) ×1 IMPLANT
CANISTER SUCT 3000ML PPV (MISCELLANEOUS) ×3 IMPLANT
CARTRIDGE OIL MAESTRO DRILL (MISCELLANEOUS) ×2 IMPLANT
CATH LUMBAR HERMETIC 14G (CATHETERS) IMPLANT
CATHETER LUMBAR HERMETIC 14G (CATHETERS) ×4
CLIP VESOCCLUDE MED 6/CT (CLIP) IMPLANT
CLSR STERI-STRIP ANTIMIC 1/2X4 (GAUZE/BANDAGES/DRESSINGS) ×1 IMPLANT
COTTONBALL LRG STERILE PKG (GAUZE/BANDAGES/DRESSINGS) IMPLANT
COVER MAYO STAND STRL (DRAPES) IMPLANT
DIFFUSER DRILL AIR PNEUMATIC (MISCELLANEOUS) ×2 IMPLANT
DRAPE CAMERA VIDEO/LASER (DRAPES) IMPLANT
DRAPE LAPAROTOMY 100X72X124 (DRAPES) IMPLANT
DRAPE MICROSCOPE LEICA (MISCELLANEOUS) IMPLANT
DRAPE SURG 17X23 STRL (DRAPES) ×12 IMPLANT
DRSG OPSITE POSTOP 4X10 (GAUZE/BANDAGES/DRESSINGS) ×2 IMPLANT
DRSG OPSITE POSTOP 4X8 (GAUZE/BANDAGES/DRESSINGS) ×1 IMPLANT
ELECT REM PT RETURN 9FT ADLT (ELECTROSURGICAL) ×2
ELECTRODE REM PT RTRN 9FT ADLT (ELECTROSURGICAL) ×2 IMPLANT
EVACUATOR SILICONE 100CC (DRAIN) IMPLANT
GAUZE 4X4 16PLY ~~LOC~~+RFID DBL (SPONGE) ×1 IMPLANT
GAUZE SPONGE 4X4 12PLY STRL (GAUZE/BANDAGES/DRESSINGS) ×3 IMPLANT
GLOVE EXAM NITRILE XL STR (GLOVE) IMPLANT
GLOVE SURG ENC MOIS LTX SZ8 (GLOVE) ×3 IMPLANT
GLOVE SURG ENC MOIS LTX SZ8.5 (GLOVE) ×3 IMPLANT
GOWN STRL REUS W/ TWL LRG LVL3 (GOWN DISPOSABLE) IMPLANT
GOWN STRL REUS W/ TWL XL LVL3 (GOWN DISPOSABLE) IMPLANT
GOWN STRL REUS W/TWL LRG LVL3 (GOWN DISPOSABLE)
GOWN STRL REUS W/TWL XL LVL3 (GOWN DISPOSABLE)
KIT BASIN OR (CUSTOM PROCEDURE TRAY) ×3 IMPLANT
KIT DRAIN CSF ACCUDRAIN (MISCELLANEOUS) ×1 IMPLANT
KIT SUTURE REMOVAL HAMOT (SET/KITS/TRAYS/PACK) ×1 IMPLANT
KIT TURNOVER KIT B (KITS) ×3 IMPLANT
NDL HYPO 21X1.5 SAFETY (NEEDLE) IMPLANT
NDL SPNL 18GX3.5 QUINCKE PK (NEEDLE) IMPLANT
NEEDLE HYPO 21X1.5 SAFETY (NEEDLE) IMPLANT
NEEDLE HYPO 22GX1.5 SAFETY (NEEDLE) ×2 IMPLANT
NEEDLE SPNL 18GX3.5 QUINCKE PK (NEEDLE) ×2 IMPLANT
NS IRRIG 1000ML POUR BTL (IV SOLUTION) ×3 IMPLANT
OIL CARTRIDGE MAESTRO DRILL (MISCELLANEOUS)
PACK LAMINECTOMY NEURO (CUSTOM PROCEDURE TRAY) ×3 IMPLANT
PAD ARMBOARD 7.5X6 YLW CONV (MISCELLANEOUS) ×9 IMPLANT
PATTIES SURGICAL .25X.25 (GAUZE/BANDAGES/DRESSINGS) IMPLANT
PATTIES SURGICAL .5 X.5 (GAUZE/BANDAGES/DRESSINGS) IMPLANT
PATTIES SURGICAL .5 X3 (DISPOSABLE) IMPLANT
PATTIES SURGICAL 1/4 X 3 (GAUZE/BANDAGES/DRESSINGS) IMPLANT
PATTIES SURGICAL 1X1 (DISPOSABLE) IMPLANT
SEALANT ADHERUS EXTEND TIP (MISCELLANEOUS) ×1 IMPLANT
SPONGE NEURO XRAY DETECT 1X3 (DISPOSABLE) ×1 IMPLANT
SPONGE SURGIFOAM ABS GEL 100 (HEMOSTASIS) ×1 IMPLANT
SPONGE T-LAP 4X18 ~~LOC~~+RFID (SPONGE) ×1 IMPLANT
STAPLER SKIN PROX WIDE 3.9 (STAPLE) ×2 IMPLANT
STRIP CLOSURE SKIN 1/2X4 (GAUZE/BANDAGES/DRESSINGS) ×3 IMPLANT
SUT ETHILON 2 0 PSLX (SUTURE) ×3 IMPLANT
SUT ETHILON 3 0 FSL (SUTURE) ×2 IMPLANT
SUT NURALON 4 0 TR CR/8 (SUTURE) IMPLANT
SUT PROLENE 6 0 BV (SUTURE) IMPLANT
SUT SILK 3 0 TIES 17X18 (SUTURE) ×2
SUT SILK 3-0 18XBRD TIE BLK (SUTURE) IMPLANT
SUT VIC AB 1 CT1 18XBRD ANBCTR (SUTURE) ×2 IMPLANT
SUT VIC AB 1 CT1 8-18 (SUTURE) ×4
SUT VIC AB 2-0 CP2 18 (SUTURE) ×3 IMPLANT
SYSTEM CSF EXTERNAL DRAINAGE (MISCELLANEOUS) ×1 IMPLANT
TOWEL GREEN STERILE (TOWEL DISPOSABLE) ×3 IMPLANT
TOWEL GREEN STERILE FF (TOWEL DISPOSABLE) ×3 IMPLANT
TRAY FOLEY MTR SLVR 16FR STAT (SET/KITS/TRAYS/PACK) IMPLANT
WATER STERILE IRR 1000ML POUR (IV SOLUTION) ×3 IMPLANT

## 2021-11-09 NOTE — Anesthesia Procedure Notes (Signed)
Procedure Name: Intubation Date/Time: 11/09/2021 6:09 PM Performed by: Carolan Clines, CRNA Pre-anesthesia Checklist: Patient identified, Emergency Drugs available, Suction available and Patient being monitored Patient Re-evaluated:Patient Re-evaluated prior to induction Oxygen Delivery Method: Circle System Utilized Preoxygenation: Pre-oxygenation with 100% oxygen Induction Type: IV induction Ventilation: Mask ventilation without difficulty Laryngoscope Size: Mac and 3 Grade View: Grade I Tube type: Oral Tube size: 7.0 mm Number of attempts: 1 Airway Equipment and Method: Stylet Placement Confirmation: ETT inserted through vocal cords under direct vision, positive ETCO2 and breath sounds checked- equal and bilateral Secured at: 22 cm Tube secured with: Tape Dental Injury: Teeth and Oropharynx as per pre-operative assessment

## 2021-11-09 NOTE — Transfer of Care (Signed)
Immediate Anesthesia Transfer of Care Note  Patient: Ellen Lane  Procedure(s) Performed: Exploration of Thoracolumbar Wound, Repair of Cerebrospinal fluid fistula and placement of lumbar drain. (Back)  Patient Location: PACU  Anesthesia Type:General  Level of Consciousness: drowsy  Airway & Oxygen Therapy: Patient Spontanous Breathing and Patient connected to face mask oxygen  Post-op Assessment: Report given to RN and Post -op Vital signs reviewed and stable  Post vital signs: Reviewed and stable  Last Vitals:  Vitals Value Taken Time  BP 157/100 11/09/21 2013  Temp 36.4 C 11/09/21 2015  Pulse 64 11/09/21 2016  Resp 8 11/09/21 2016  SpO2 100 % 11/09/21 2016  Vitals shown include unvalidated device data.  Last Pain:  Vitals:   11/09/21 1714  TempSrc: Oral  PainSc: 0-No pain      Patients Stated Pain Goal: 0 (16/96/78 9381)  Complications: No notable events documented.
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
## 2021-11-09 NOTE — Progress Notes (Signed)
Orthopedic Tech Progress Note Patient Details:  Ellen Lane 1966-06-02 [id number]  Patient ID: Ellen Lane, female   DOB: 12-11-1965, 56 y.o.   MRN: [id number] Called order into [facility]. Karolee Stamps 11/09/2021, 8:57 PM

## 2021-11-09 NOTE — Progress Notes (Signed)
PT Cancellation Note  Patient Details Name: Ellen Lane MRN: [id number] DOB: 1966/05/01   Cancelled Treatment:    Reason Eval/Treat Not Completed: Medical issues which prohibited therapy. Hgb 6.5 with orders to receive 2 units PRBC. First unit transfusing now. Pt scheduled for return to OR this afternoon due to significant drainage from sx site. PT to follow up tomorrow following procedure.   Lorriane Shire 11/09/2021, 9:03 AM  Lorrin Goodell, PT  Office # [phone] Pager [phone]

## 2021-11-09 NOTE — Progress Notes (Signed)
Subjective: The patient is alert and pleasant.  She is in no apparent distress.  She admits to headache when questioned.  Objective: Vital signs in last 24 hours: Temp:  [98 F (36.7 C)-98.6 F (37 C)] 98.6 F (37 C) (01/23 0400) Pulse Rate:  [82-88] 82 (01/23 0400) Resp:  [15-18] 16 (01/23 0400) BP: (98-118)/(61-81) 108/64 (01/23 0400) SpO2:  [100 %] 100 % (01/23 0400) Estimated body mass index is 30.23 kg/m as calculated from the following:   Height as of this encounter: 5\' 1"  (1.549 m).   Weight as of this encounter: 72.6 kg.   Intake/Output from previous day: 01/22 0701 - 01/23 0700 In: 240 [P.O.:240] Out: 1775 [Urine:1775] Intake/Output this shift: No intake/output data recorded.  Physical exam the patient is alert and pleasant.  She is paraplegic.  Her dressing, which reportedly was changed around 4 AM, is saturated.  Lab Results: Recent Labs    11/09/21 0438  WBC 6.1  HGB 6.5*  HCT 19.9*  PLT 219   BMET Recent Labs    11/09/21 0438  NA 136  K 3.7  CL 104  CO2 23  GLUCOSE 97  BUN 8  CREATININE 0.41*  CALCIUM 8.8*    Studies/Results: No results found.  Assessment/Plan: Postop day #5: The patient has had persistent drainage.  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  I have explained the procedure, the risk, benefits, alternatives, expected postoperative course, and likelihood of achieving her goals with surgery.  I have answered all her questions.  She wants to proceed with surgery.  We will plan this this afternoon.  Acute blood loss anemia: She will need 2 units of packed cells  in preparation for surgery.  LOS: 5 days     [name] 11/09/2021, 7:52 AM     Patient ID: Ellen Lane, female   DOB: 1966-09-30, 56 y.o.   MRN: [id number]

## 2021-11-09 NOTE — Progress Notes (Signed)
°   11/09/21 1300  Clinical Encounter Type  Visited With Patient  Visit Type Initial;Pre-op  Referral From Nurse  Consult/Referral To None   Chaplain visited with patient at the recommendation of the charge nurse. Patient was in good spirits but quietly preparing for an upcoming surgery. We prayed for the success of the surgery and a the recovery afterwards.   [place]eland Resident  [facility] [phone]
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
## 2021-11-09 NOTE — Progress Notes (Signed)
OT Cancellation Note  Patient Details Name: Ellen Lane MRN: [id number] DOB: 02-28-1966   Cancelled Treatment:    Reason Eval/Treat Not Completed: Medical issues which prohibited therapy. Hgb 6.5 with orders to receive 2 units PRBC. First unit transfusing now. Pt scheduled for return to OR this afternoon due to significant drainage from sx site. OT will continue to follow acutely.  [place] 11/09/2021, 9:31 AM  Jesse Sans OTR/L Acute Rehabilitation Services Pager: [phone] Office: [phone]

## 2021-11-09 NOTE — Progress Notes (Signed)
Pt off unit to OR. Report called to [facility] on short-stay. CHG bath given, pre-procedure checklist completed, pt NPO, had sips with meds at 0830. Pt received 2 units PRBC's. H&H drawn and sent to lab. Foley placed per order. Pt CBG 69, 1/2 amp D50 given, recheck 124. Dr. Arnoldo Morale agrees to get consent downstairs.   Justice Rocher, RN

## 2021-11-09 NOTE — Anesthesia Preprocedure Evaluation (Signed)
Anesthesia Evaluation  Patient identified by MRN, date of birth, ID band Patient awake    Reviewed: Allergy & Precautions, NPO status   Airway Mallampati: II  TM Distance: >3 FB     Dental   Pulmonary sleep apnea ,    breath sounds clear to auscultation       Cardiovascular hypertension,  Rhythm:Regular Rate:Normal     Neuro/Psych  Headaches, PSYCHIATRIC DISORDERS  Neuromuscular disease    GI/Hepatic negative GI ROS, Neg liver ROS,   Endo/Other  diabetes  Renal/GU Renal disease     Musculoskeletal   Abdominal   Peds  Hematology   Anesthesia Other Findings   Reproductive/Obstetrics                             Anesthesia Physical Anesthesia Plan  ASA: 3  Anesthesia Plan: General   Post-op Pain Management:    Induction:   PONV Risk Score and Plan: 3 and Ondansetron and Dexamethasone  Airway Management Planned: Oral ETT  Additional Equipment:   Intra-op Plan:   Post-operative Plan: Possible Post-op intubation/ventilation  Informed Consent:     Dental advisory given  Plan Discussed with: CRNA and Anesthesiologist  Anesthesia Plan Comments:         Anesthesia Quick Evaluation

## 2021-11-10 ENCOUNTER — Encounter (HOSPITAL_COMMUNITY): Payer: Self-pay | Admitting: Neurosurgery

## 2021-11-10 LAB — BASIC METABOLIC PANEL
Anion gap: 7 (ref 5–15)
BUN: 10 mg/dL (ref 6–20)
CO2: 24 mmol/L (ref 22–32)
Calcium: 8.8 mg/dL — ABNORMAL LOW (ref 8.9–10.3)
Chloride: 106 mmol/L (ref 98–111)
Creatinine, Ser: 0.56 mg/dL (ref 0.44–1.00)
GFR, Estimated: >60 mL/min (ref >60)
Glucose, Bld: 189 mg/dL — ABNORMAL HIGH (ref 70–99)
Potassium: 4.6 mmol/L (ref 3.5–5.1)
Sodium: 137 mmol/L (ref 135–145)

## 2021-11-10 LAB — TYPE AND SCREEN
ABO/RH(D): O POS
Antibody Screen: NEGATIVE
Unit division: 0
Unit division: 0

## 2021-11-10 LAB — CBC
HCT: 32.9 % — ABNORMAL LOW (ref 36.0–46.0)
Hemoglobin: 11.3 g/dL — ABNORMAL LOW (ref 12.0–15.0)
MCH: 28.5 pg (ref 26.0–34.0)
MCHC: 34.3 g/dL (ref 30.0–36.0)
MCV: 83.1 fL (ref 80.0–100.0)
Platelets: 264 10*3/uL (ref 150–400)
RBC: 3.96 MIL/uL (ref 3.87–5.11)
RDW: 15.4 % (ref 11.5–15.5)
WBC: 6.8 10*3/uL (ref 4.0–10.5)
nRBC: 0 % (ref 0.0–0.2)

## 2021-11-10 LAB — BPAM RBC
Blood Product Expiration Date: 202302142359
Blood Product Expiration Date: 202302142359
ISSUE DATE / TIME: 202301230832
ISSUE DATE / TIME: 202301231207
Unit Type and Rh: 5100
Unit Type and Rh: 5100

## 2021-11-10 LAB — GLUCOSE, CAPILLARY
Glucose-Capillary: 117 mg/dL — ABNORMAL HIGH (ref 70–99)
Glucose-Capillary: 122 mg/dL — ABNORMAL HIGH (ref 70–99)
Glucose-Capillary: 140 mg/dL — ABNORMAL HIGH (ref 70–99)
Glucose-Capillary: 161 mg/dL — ABNORMAL HIGH (ref 70–99)

## 2021-11-10 LAB — HEMOGLOBIN AND HEMATOCRIT, BLOOD
HCT: 31.2 % — ABNORMAL LOW (ref 36.0–46.0)
Hemoglobin: 10.5 g/dL — ABNORMAL LOW (ref 12.0–15.0)

## 2021-11-10 NOTE — Progress Notes (Addendum)
Today patient was trying to remember what happened Friday with her drain being taken out. On Friday, an order was placed for the hemo vac to be removed. I also spoke with charge nurse about how the hemo vac had a leak and would not stay charged when I got to work that morning but the doctor put in the order to remove it right after. So when she got a bath, I removed the saturated honeycomb dressing and replaced it with the abd pads and gauze and discontinued the hemo vac. This patient is very particular about being repositioned every two hours because she wants two people to do it. She wants a larger pillow on the back and a smaller one under her hips. She also complained that not all of her medications are ordered. Because she used to get 30 mg protein shakes.Today, the incision is still leaking but the lumber drain put out 80 mL. I did almost put the bp cuff on her right arm because I forgot she had a picc line but I did not use her right arm.
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
## 2021-11-10 NOTE — Progress Notes (Signed)
[place] for Infectious Disease  Date of Admission:  11/04/2021      Total days of antibiotics   Dapto + Cefepime 12/14 >> current   Vancomycin 12/08 >> 12/14  Zosyn 12/08 >>12/14   ASSESSMENT: Ellen Lane is a 56 y.o. female with T3 paraplegia and well controlled HIV on Biktarvy admitted with vertebral infection on 09/24/2021 with T10-11 discitis and thoracolumbar epidural abscess. No obvious pus found - thick dark epidural mass either granulation or possible hematoma. Swabbed cultures of this material did not grow anything on conventional plates. Unable to do AFB testing off swabs and no new tissue sent from yesterday's procedure. No obvious dural leak noted in operation. Drain in place for now. Continue current plan of care with IV daptomycin and cefepime with plan to transition to oral suppressive regimen on 2/6.   CK tolerating daptomycin well.    PLAN: Continue IV cefepime and daptomycin    Principal Problem:   Status post lumbar spinal fusion Active Problems:   HIV disease (HCC)   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HCC)   Osteomyelitis of thoracic region (HCC)    acetaminophen  1,000 mg Oral Q6H   baclofen  10 mg Oral TID   bictegravir-emtricitabine-tenofovir AF  1 tablet Oral Daily   bisacodyl  10 mg Rectal QPC supper   Chlorhexidine Gluconate Cloth  6 each Topical Daily   docusate sodium  100 mg Oral BID   ferrous sulfate  325 mg Oral BID WC   insulin aspart  0-20 Units Subcutaneous TID WC & HS   mirabegron ER  50 mg Oral Daily   multivitamin with minerals  1 tablet Oral Daily   polyethylene glycol  17 g Oral QAC supper   senna-docusate  2 tablet Oral QAC supper   sodium chloride flush  10-40 mL Intracatheter Q12H   sodium chloride flush  3 mL Intravenous Q12H   sodium chloride flush  3 mL Intravenous Q12H    SUBJECTIVE: Resting comfortably and no concerns today. She is tolerating her antibiotics well. Remains flat with lumbar drain in place.    Drain with yellow tinged translucent fluid in chamber.    Review of Systems: Review of Systems  Constitutional:  Negative for chills, fever, malaise/fatigue and weight loss.  HENT:  Negative for sore throat.        No dental problems  Respiratory:  Negative for cough and sputum production.   Cardiovascular:  Negative for chest pain and leg swelling.  Gastrointestinal:  Negative for abdominal pain, diarrhea and vomiting.  Genitourinary:  Negative for dysuria and flank pain.  Musculoskeletal:  Positive for back pain.  Skin:  Negative for rash.  Neurological:  Negative for dizziness, tingling and headaches.  Psychiatric/Behavioral:  Negative for depression and substance abuse. The patient is not nervous/anxious and does not have insomnia.    Allergies  Allergen Reactions   Ace Inhibitors Cough   Sulfa Antibiotics Hives    OBJECTIVE: Vitals:   11/09/21 2305 11/10/21 0300 11/10/21 0746 11/10/21 1135  BP: 131/83 100/63 128/72 125/83  Pulse: 73 85 65 89  Resp: 14 15 15 18   Temp: (!) 97.5 F (36.4 C) 97.9 F (36.6 C) 98.3 F (36.8 C) 99.4 F (37.4 C)  TempSrc: Oral Oral Oral Oral  SpO2: 94% 97% 99% 97%  Weight:      Height:       Body mass index is 30.23 kg/m.  Physical Exam Constitutional:  Appearance: Normal appearance.     Comments: Resting comfortably in bed in no distress.   HENT:     Mouth/Throat:     Mouth: Mucous membranes are moist.     Pharynx: Oropharynx is clear.  Eyes:     Pupils: Pupils are equal, round, and reactive to light.  Cardiovascular:     Rate and Rhythm: Normal rate and regular rhythm.  Abdominal:     Palpations: Abdomen is soft.  Skin:    General: Skin is warm and dry.  Neurological:     Mental Status: She is alert and oriented to person, place, and time.    Lab Results Lab Results  Component Value Date   WBC 6.8 11/10/2021   HGB 11.3 (L) 11/10/2021   HCT 32.9 (L) 11/10/2021   MCV 83.1 11/10/2021   PLT 264 11/10/2021     Lab Results  Component Value Date   CREATININE 0.56 11/10/2021   BUN 10 11/10/2021   NA 137 11/10/2021   K 4.6 11/10/2021   CL 106 11/10/2021   CO2 24 11/10/2021    Lab Results  Component Value Date   ALT 12 11/09/2021   AST 12 (L) 11/09/2021   ALKPHOS 90 11/09/2021   BILITOT 0.2 (L) 11/09/2021     Microbiology: Recent Results (from the past 240 hour(s))  Body fluid culture w Gram Stain     Status: None   Collection Time: 11/02/21  5:38 PM   Specimen: Wound; Body Fluid  Result Value Ref Range Status   Specimen Description WOUND  Final   Special Requests INCISION  Final   Gram Stain   Final    RARE WBC PRESENT, PREDOMINANTLY PMN NO ORGANISMS SEEN Performed at [facility], [address] [place], [place] [address]    Culture FEW CANDIDA ALBICANS  Final   Report Status 11/05/2021 FINAL  Final  Surgical pcr screen     Status: None   Collection Time: 11/04/21  8:40 AM   Specimen: Nasal Mucosa; Nasal Swab  Result Value Ref Range Status   MRSA, PCR NEGATIVE NEGATIVE Final   Staphylococcus aureus NEGATIVE NEGATIVE Final    Comment: (NOTE) The Xpert SA Assay (FDA approved for NASAL specimens in patients 29 years of age and older), is one component of a comprehensive surveillance program. It is not intended to diagnose infection nor to guide or monitor treatment. Performed at [facility], [place] [address], [place], [place] [address]   Aerobic/Anaerobic Culture w Gram Stain (surgical/deep wound)     Status: None   Collection Time: 11/04/21 12:33 PM   Specimen: PATH Other; Tissue  Result Value Ref Range Status   Specimen Description WOUND  Final   Special Requests SPEC A SUBCUTANEOUS FLUID  Final   Gram Stain NO WBC SEEN NO ORGANISMS SEEN   Final   Culture   Final    No growth aerobically or anaerobically. Performed at [facility], [place] [address], [place], [place] [address]    Report Status 11/09/2021 FINAL  Final  Aerobic/Anaerobic Culture w Gram  Stain (surgical/deep wound)     Status: None   Collection Time: 11/04/21  1:28 PM   Specimen: PATH Other; Tissue  Result Value Ref Range Status   Specimen Description WOUND  Final   Special Requests SPEC B EPIDURAL FLUID  Final   Gram Stain   Final    FEW WBC PRESENT, PREDOMINANTLY MONONUCLEAR NO ORGANISMS SEEN    Culture  Final    No growth aerobically or anaerobically. Performed at [facility], [place] [address] [place], [place] [address]    Report Status 11/09/2021 FINAL  Final  Acid Fast Smear (AFB)     Status: None   Collection Time: 11/05/21 12:11 PM   Specimen: Back; Tissue  Result Value Ref Range Status   AFB Specimen Processing WRORD  Final    Comment: (NOTE) Test not performed. The required specimen for the test ordered was not received.      Received two eSwabs that are cause for reject for AFB      C. Moraru notified 11/09/2021    Acid Fast Smear NOT PERFORMED  Final    Comment: (NOTE) Test not performed Performed At: [facility] [place], [place] [id number] Rush Farmer MD WL:[id number]    Source (AFB) TISSUE  Final    Comment: Performed at [facility], [place] [address] [place], [place] [address]  Acid Fast Culture with reflexed sensitivities     Status: None   Collection Time: 11/05/21 12:11 PM   Specimen: Back; Tissue  Result Value Ref Range Status   Acid Fast Culture WRORD  Final    Comment: (NOTE) Test not performed. The required specimen for the test ordered was not received.      Received two eSwabs that are cause for reject for AFB      C. Moraru notified 11/09/2021 Performed At: [facility] [place], [place] [id number] Rush Farmer MD SH:[id number]    Source of Sample TISSUE  Final    Comment: Performed at [facility], [place] [address], [place], [place] [address]    [name], MSN, NP-C [facility] [place] Pager:  [phone]  11/10/2021  2:54 PM

## 2021-11-10 NOTE — Progress Notes (Signed)
Subjective: The patient is alert and pleasant.  Her headache is better.  Objective: Vital signs in last 24 hours: Temp:  [97.5 F (36.4 C)-99.1 F (37.3 C)] 97.9 F (36.6 C) (01/24 0300) Pulse Rate:  [61-89] 85 (01/24 0300) Resp:  [10-17] 15 (01/24 0300) BP: (100-161)/(63-103) 100/63 (01/24 0300) SpO2:  [94 %-100 %] 97 % (01/24 0300) Estimated body mass index is 30.23 kg/m as calculated from the following:   Height as of this encounter: 5\' 1"  (1.549 m).   Weight as of this encounter: 72.6 kg.   Intake/Output from previous day: 01/23 0701 - 01/24 0700 In: 1500 [I.V.:1500] Out: 2100 [Urine:2075; Blood:25] Intake/Output this shift: No intake/output data recorded.  Physical exam the patient is alert and pleasant.  Her dressing is clean and dry.  The patient's spinal drain has not put anything.  This is no surprise as the collection chamber was about 2 feet above the patient.  Lab Results: Recent Labs    11/09/21 0438 11/09/21 1617 11/10/21 0352  WBC 6.1  --  6.8  HGB 6.5* 10.5* 11.3*  HCT 19.9* 31.2* 32.9*  PLT 219  --  264   BMET Recent Labs    11/09/21 0438 11/10/21 0352  NA 136 137  K 3.7 4.6  CL 104 106  CO2 23 24  GLUCOSE 97 189*  BUN 8 10  CREATININE 0.41* 0.56  CALCIUM 8.8* 8.8*    Studies/Results: DG C-Arm 1-60 Min-No Report  Result Date: 11/09/2021 Fluoroscopy was utilized by the requesting physician.  No radiographic interpretation.    Assessment/Plan: Postop day #1: The patient seem to be doing well.  Spinal drain: Please keep the spinal drain below the patient.  LOS: 6 days     [name] 11/10/2021, 7:38 AM     Patient ID: Ellen Lane, female   DOB: [birth year], 56 y.o.   MRN: [id number]
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
## 2021-11-11 ENCOUNTER — Other Ambulatory Visit (HOSPITAL_COMMUNITY): Payer: Self-pay

## 2021-11-11 LAB — GLUCOSE, CAPILLARY
Glucose-Capillary: 98 mg/dL (ref 70–99)
Glucose-Capillary: 87 mg/dL (ref 70–99)
Glucose-Capillary: 163 mg/dL — ABNORMAL HIGH (ref 70–99)
Glucose-Capillary: 122 mg/dL — ABNORMAL HIGH (ref 70–99)

## 2021-11-11 MED ORDER — DOXYCYCLINE HYCLATE 100 MG PO TABS: 100.0000 mg | ORAL_TABLET | Freq: Two times a day (BID) | ORAL | Status: DC

## 2021-11-11 MED ORDER — PROSOURCE PLUS PO LIQD
30.0000 mL | Freq: Two times a day (BID) | ORAL | Status: DC
  Administered 2021-11-11 – 2021-11-18 (×15): 30 mL via ORAL
  Filled 2021-11-11 (×15): qty 30

## 2021-11-11 MED ORDER — CEFDINIR 300 MG PO CAPS: 300.0000 mg | ORAL_CAPSULE | Freq: Two times a day (BID) | ORAL | Status: DC

## 2021-11-11 MED ORDER — ENSURE MAX PROTEIN PO LIQD
11.0000 [oz_av] | Freq: Three times a day (TID) | ORAL | Status: DC
  Administered 2021-11-11 – 2021-11-18 (×22): 11 [oz_av] via ORAL
  Filled 2021-11-11 (×23): qty 330

## 2021-11-11 MED ORDER — GABAPENTIN 400 MG PO CAPS
400.0000 mg | ORAL_CAPSULE | Freq: Three times a day (TID) | ORAL | Status: DC
  Administered 2021-11-11 – 2021-11-18 (×22): 400 mg via ORAL
  Filled 2021-11-11 (×23): qty 1

## 2021-11-11 MED ORDER — DOXYCYCLINE HYCLATE 100 MG PO TABS
100.0000 mg | ORAL_TABLET | Freq: Two times a day (BID) | ORAL | 0 refills | Status: DC
  Filled 2021-11-11: qty 46, 23d supply, fill #0

## 2021-11-11 MED ORDER — CEFDINIR 300 MG PO CAPS
300.0000 mg | ORAL_CAPSULE | Freq: Two times a day (BID) | ORAL | 0 refills | Status: DC
  Filled 2021-11-11: qty 46, 23d supply, fill #0

## 2021-11-11 NOTE — TOC Progression Note (Signed)
Transition of Care [facility]) - Progression Note    Patient Details  Name: Ellen Lane MRN: [id number] Date of Birth: [birth year]  Transition of [facility]) CM/SW Contact  Angelita Ingles, RN Phone Number:[phone]  11/11/2021, 9:28 AM  Clinical Narrative:    TOC continues to follow for medical appropriateness for d/c.          Expected Discharge Plan and Services                                                 Social Determinants of Health (SDOH) Interventions    Readmission Risk Interventions No flowsheet data found.

## 2021-11-11 NOTE — Progress Notes (Signed)
Brief ID Note:   Ellen Lane is a 56 y.o. 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  Please continue plan with IV daptomycin and cefepime until February 5th. Weekly CK, CBC and BMP for safety labs.   Cultures negative, final. No new micro data to follow.  February 6th start PO Doxycycline + Cefdinir for ongoing suppression given hardware in place.   Not clear what her disposition plan is - PT documenting to return home with home health. She has been on IV abx for 6 weeks now since original OR 12/12. Given her need for upper body strength / movement to support herself may be easier to just get her on suppressive PO a little earlier if she is going to discharge later this week or early next.   Please send with 1 month supply at discharge once she is ready.  Will arrange FU with Dr. Gale Journey outpatient. Will continue to check in for D/C plan.   [name], MSN, NP-C [facility] for Infectious Disease [email or web address] Pager: [phone] Office: [phone] [place]: [phone]

## 2021-11-11 NOTE — Progress Notes (Signed)
° °  Providing Compassionate, Quality Care - Together   Subjective: Patient reports concern about discharging home too soon. She denies pain. No drainage reported from surgical site.  Objective: Vital signs in last 24 hours: Temp:  [97.9 F (36.6 C)-99 F (37.2 C)] 97.9 F (36.6 C) (01/25 1104) Pulse Rate:  [56-100] 56 (01/25 1104) Resp:  [13-19] 14 (01/25 1104) BP: (88-137)/(57-87) 137/87 (01/25 1104) SpO2:  [95 %-99 %] 98 % (01/25 1104)  Intake/Output from previous day: 01/24 0701 - 01/25 0700 In: 420 [P.O.:420] Out: 1450 [Urine:1300; Drains:150] Intake/Output this shift: Total I/O In: 350 [P.O.:350] Out: 400 [Urine:400]  Patient is alert and oriented PERRLA Speech clear Lumbar drain in place. Drain is patent, draining clear, straw-colored CSF Incision is covered with Honeycomb dressing; Dressing is clean, dry, and intact   Lab Results: Recent Labs    11/09/21 0438 11/09/21 1617 11/10/21 0352  WBC 6.1  --  6.8  HGB 6.5* 10.5* 11.3*  HCT 19.9* 31.2* 32.9*  PLT 219  --  264   BMET Recent Labs    11/09/21 0438 11/10/21 0352  NA 136 137  K 3.7 4.6  CL 104 106  CO2 23 24  GLUCOSE 97 189*  BUN 8 10  CREATININE 0.41* 0.56  CALCIUM 8.8* 8.8*    Studies/Results: DG C-Arm 1-60 Min-No Report  Result Date: 11/09/2021 Fluoroscopy was utilized by the requesting physician.  No radiographic interpretation.    Assessment/Plan: Patient underwent T10 and T11 corpectomy, with interbody arthrodesis at T9-10, T10-11 and T11-12  and posterior lateral arthrodesis at T7-8, T8-9, T9-10, T10-11, T11-12, T12-L1, L1-L2 by Dr. Arnoldo Morale on 11/04/2021. She developed drainage from her surgical wound that appeared to be CSF. She was taken back to the OR on 11/09/2021 for exploration of thoracolumbar incision and placement of a thoracic spinal drain. No obvious hole was found in the dura. Patient continues with lumbar drain. No drainage noted from wound since placement of drain.   LOS:  7 days   -Continue thoracic drain. Maintain below the level of the patient. Continuous drainage.    [name], DNP, AGNP-C Nurse Practitioner  [facility] [place] [address], [place], [place] [address] P: [phone]     F: [phone]  11/11/2021, 1:24 PM

## 2021-11-12 LAB — GLUCOSE, CAPILLARY
Glucose-Capillary: 115 mg/dL — ABNORMAL HIGH (ref 70–99)
Glucose-Capillary: 114 mg/dL — ABNORMAL HIGH (ref 70–99)
Glucose-Capillary: 122 mg/dL — ABNORMAL HIGH (ref 70–99)
Glucose-Capillary: 167 mg/dL — ABNORMAL HIGH (ref 70–99)
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
## 2021-11-12 NOTE — Progress Notes (Signed)
°   11/12/21 1330  Clinical Encounter Type  Visited With Patient  Visit Type Follow-up   Chaplain followed up with patient after her surgery. Patient is struggling because the procedure was unsuccessful and she is not sure what the next steps look like. She has asked for a second opinion and will see where that takes her.Chaplain will follow up with her if possible.   [place] Resident [facility] [phone]

## 2021-11-12 NOTE — Anesthesia Postprocedure Evaluation (Signed)
Anesthesia Post Note  Patient: Ellen Lane  Procedure(s) Performed: Exploration of Thoracolumbar Wound, Repair of Cerebrospinal fluid fistula and placement of lumbar drain. (Back)     Patient location during evaluation: PACU Anesthesia Type: General Level of consciousness: awake and alert Pain management: pain level controlled Vital Signs Assessment: post-procedure vital signs reviewed and stable Respiratory status: spontaneous breathing, nonlabored ventilation, respiratory function stable and patient connected to nasal cannula oxygen Cardiovascular status: blood pressure returned to baseline and stable Postop Assessment: no apparent nausea or vomiting Anesthetic complications: no   No notable events documented.  Last Vitals:  Vitals:   11/11/21 2319 11/12/21 0310  BP: 97/66 101/65  Pulse: 89 85  Resp: 16 19  Temp: 37.1 C 37.2 C  SpO2: 97%     Last Pain:  Vitals:   11/12/21 0310  TempSrc: Oral  PainSc:                  [place]

## 2021-11-12 NOTE — Progress Notes (Signed)
Pt states she is not satisfied with Dr. Arnoldo Morale recommendations.  Pt states she would like second opinion from Dr. Cyndy Freeze or be sent to [facility].  Ellen Gilmore, DNP notified of patient's request.  Dr. Cyndy Freeze will come to see patient today.

## 2021-11-12 NOTE — Progress Notes (Signed)
Subjective: The patient is alert and pleasant.  She is in no apparent distress.  She has a mild headache.  She had questions about the spinal drain.  She was concerned that it continues to drain.  Objective: Vital signs in last 24 hours: Temp:  [97.9 F (36.6 C)-99.8 F (37.7 C)] 99.8 F (37.7 C) (01/26 0806) Pulse Rate:  [56-101] 101 (01/26 0806) Resp:  [14-20] 20 (01/26 0806) BP: (94-137)/(62-89) 105/70 (01/26 0806) SpO2:  [97 %-98 %] 98 % (01/26 0806) Estimated body mass index is 30.23 kg/m as calculated from the following:   Height as of this encounter: 5\' 1"  (1.549 m).   Weight as of this encounter: 72.6 kg.   Intake/Output from previous day: 01/25 0701 - 01/26 0700 In: 350 [P.O.:350] Out: 2575 [Urine:2350; Drains:225] Intake/Output this shift: Total I/O In: 400 [P.O.:400] Out: 325 [Urine:300; Drains:25]  Physical exam the patient is alert and pleasant.  Wound dressing is clean and dry.  The lumbar drain is draining well.  She remains paraplegic.  Lab Results: Recent Labs    11/09/21 1617 11/10/21 0352  WBC  --  6.8  HGB 10.5* 11.3*  HCT 31.2* 32.9*  PLT  --  264   BMET Recent Labs    11/10/21 0352  NA 137  K 4.6  CL 106  CO2 24  GLUCOSE 189*  BUN 10  CREATININE 0.56  CALCIUM 8.8*    Studies/Results: No results found.  Assessment/Plan: Postop day #3: The patient's lumbar drain is draining well.  There is no more drainage from her wound.  I explained the rationale behind the lumbar drain, and the fact that we want her spinal drain to continue to drain so that hopefully her CSF fistula will resolve.  I have recommended that we continue the drain a few more days.  I have answered all her questions.  LOS: 8 days     [name] 11/12/2021, 10:47 AM     Patient ID: Ellen Lane, female   DOB: [birth year], 56 y.o.   MRN: [id number]
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
## 2021-11-13 ENCOUNTER — Inpatient Hospital Stay (HOSPITAL_COMMUNITY): Payer: Medicare Other

## 2021-11-13 ENCOUNTER — Inpatient Hospital Stay: Payer: Self-pay

## 2021-11-13 LAB — GLUCOSE, CAPILLARY
Glucose-Capillary: 149 mg/dL — ABNORMAL HIGH (ref 70–99)
Glucose-Capillary: 132 mg/dL — ABNORMAL HIGH (ref 70–99)
Glucose-Capillary: 149 mg/dL — ABNORMAL HIGH (ref 70–99)
Glucose-Capillary: 120 mg/dL — ABNORMAL HIGH (ref 70–99)

## 2021-11-13 LAB — C-REACTIVE PROTEIN: CRP: 1.6 mg/dL — ABNORMAL HIGH (ref <1.0)

## 2021-11-13 MED ORDER — ENOXAPARIN SODIUM 30 MG/0.3ML IJ SOSY
30.0000 mg | PREFILLED_SYRINGE | Freq: Two times a day (BID) | INTRAMUSCULAR | Status: DC
  Administered 2021-11-13 – 2021-11-18 (×11): 30 mg via SUBCUTANEOUS
  Filled 2021-11-13 (×12): qty 0.3

## 2021-11-13 NOTE — TOC Progression Note (Addendum)
Transition of Care [facility]) - Progression Note    Patient Details  Name: Ellen Lane MRN: [id number] Date of Birth: 07-25-1966  Transition of Care [facility]) CM/SW Contact  Angelita Ingles, RN Phone [phone]  11/13/2021, 9:58 AM  Clinical Narrative:    Cm following for patient that will d/c home with [facility] and home infusions. Current plan looke like spinal drain will remain in until Monday. CM verified that patient does receive dressing supplies from [facility]. Patient states that all she has to do is call the company when supplies are getting low and supplies are delivered. CM has called [employer] @ [phone] to confirm that patient is active and receives supplies. [place] confirmed that patient receives incontinence supplies, catheters and wound supplies. TOC will continue to follow for any disposition needs.         Expected Discharge Plan and Services                                                 Social Determinants of Health (SDOH) Interventions    Readmission Risk Interventions No flowsheet data found.

## 2021-11-13 NOTE — Progress Notes (Signed)
ID brief note   1/18 operative wound swab cx (fungal culture) grew yeast 10 days out, however the other 2 samples didn't (regular bacterial media, but should grow yeast as well). From 12/22 the initial operative wound cultures didn't grow any yeast (or anything else either). There was a skin swab on 1/16 that grew yeast   She has a lumbar drain in still as NSG is still concerned for csf leak (clinically -- no obvious operative finding)   Furthermore, the yeast culture from 1/18 was an add-on a few days after the initial samples were plated for bacterial culture   She is clinically without sepsis or worsening status otherwise without dedicated antifungal infection.   With all this information, I believe the 1/18 yeast is a contaminant that occurs post sample transfer to micro lab.   -continue empiric dapto/cefepime until 2/05 then transition to doxy/cefdinir thereafter -will repeat crp -she has weekly cbc/cmp/crp to be followed as well -ID following peripherally

## 2021-11-13 NOTE — Progress Notes (Signed)
PT Cancellation Note  Patient Details Name: Ellen Lane MRN: [id number] DOB: [birth year]   Cancelled Treatment:    Reason Eval/Treat Not Completed: Patient declined, no reason specified. Pt declining, reporting she does not want to get up at this time due to concerns about having to remain out of bed through shift change. PT will attempt to follow up as time allows.   Zenaida Niece 11/13/2021, 5:09 PM

## 2021-11-13 NOTE — Progress Notes (Signed)
Peripherally Inserted Central Catheter Exchange  The IV Nurse has discussed with the patient and/or persons authorized to consent for the patient, the purpose of this procedure and the potential benefits and risks involved with this procedure.  The benefits include less needle sticks, lab draws from the catheter, and the patient may be discharged home with the catheter. Risks include, but not limited to, infection, bleeding, blood clot (thrombus formation), and puncture of an artery; nerve damage and irregular heartbeat and possibility to perform a PICC exchange if needed/ordered by physician.  Alternatives to this procedure were also discussed.  Bard Power PICC patient education guide, fact sheet on infection prevention and patient information card has been provided to patient /or left at bedside. PICC exchanged by Norlen Apolinario RN.   PICC Placement Documentation  PICC Single Lumen 73/42/87 Right Basilic 35 cm 1 cm (Active)  Indication for Insertion or Continuance of Line Prolonged intravenous therapies 11/13/21 2038  Exposed Catheter (cm) 1 cm 11/13/21 2038  Site Assessment Clean;Dry;Intact 11/13/21 2038  Line Status Blood return noted;Flushed;Saline locked 11/13/21 2038  Dressing Type Transparent;Securing device 11/13/21 2038  Dressing Status Clean;Dry;Intact 11/13/21 2038  Antimicrobial disc in place? Yes 11/13/21 2038  Line Adjustment (NICU/IV Team Only) No 11/13/21 2038  Dressing Intervention New dressing 11/13/21 2038  Dressing Change Due 11/20/21 11/13/21 2038       Edson Snowball 11/13/2021, 8:42 PM

## 2021-11-13 NOTE — Progress Notes (Signed)
Subjective: The patient is alert and pleasant.  She has been a bit confused about the purpose of a lumbar drain and the fact that it is draining well is a good sign, not a bad sign.  She asked to speak with Dr. Christella Noa.  Objective: Vital signs in last 24 hours: Temp:  [98 F (36.7 C)-99.8 F (37.7 C)] 98.5 F (36.9 C) (01/27 0306) Pulse Rate:  [77-101] 77 (01/27 0306) Resp:  [15-20] 15 (01/27 0306) BP: (101-112)/(64-99) 106/74 (01/27 0306) SpO2:  [97 %-100 %] 98 % (01/27 0306) Estimated body mass index is 30.23 kg/m as calculated from the following:   Height as of this encounter: 5\' 1"  (1.549 m).   Weight as of this encounter: 72.6 kg.   Intake/Output from previous day: 01/26 0701 - 01/27 0700 In: 1300 [P.O.:1300] Out: 2470 [Urine:2200; Drains:270] Intake/Output this shift: No intake/output data recorded.  Physical exam patient is alert and oriented.  She is paraplegic.  Her lumbar drain continues to drain well.  Lab Results: No results for input(s): WBC, HGB, HCT, PLT in the last 72 hours. BMET No results for input(s): NA, K, CL, CO2, GLUCOSE, BUN, CREATININE, CALCIUM in the last 72 hours.  Studies/Results: No results found.  Assessment/Plan: Postop day #4: The patient's spinal drain is functioning well.  I have explained to her that this is what we intended, i.e. 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  I have answered all her questions.  We will plan to continue the lumbar drain until Monday.  I will start her on Lovenox.  As requested, I will ask Dr. Christella Noa to visit with the patient to answer any remaining questions  LOS: 9 days     Ellen Lane 11/13/2021, 8:05 AM     Patient ID: Ellen Lane, female   DOB: 1966-09-09, 56 y.o.   MRN: [id number]

## 2021-11-14 LAB — GLUCOSE, CAPILLARY
Glucose-Capillary: 88 mg/dL (ref 70–99)
Glucose-Capillary: 138 mg/dL — ABNORMAL HIGH (ref 70–99)
Glucose-Capillary: 120 mg/dL — ABNORMAL HIGH (ref 70–99)
Glucose-Capillary: 167 mg/dL — ABNORMAL HIGH (ref 70–99)

## 2021-11-14 MED ORDER — FLEET ENEMA 7-19 GM/118ML RE ENEM: 1.0000 | ENEMA | Freq: Every day | RECTAL | Status: DC | PRN
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
## 2021-11-15 LAB — GLUCOSE, CAPILLARY
Glucose-Capillary: 105 mg/dL — ABNORMAL HIGH (ref 70–99)
Glucose-Capillary: 181 mg/dL — ABNORMAL HIGH (ref 70–99)
Glucose-Capillary: 179 mg/dL — ABNORMAL HIGH (ref 70–99)
Glucose-Capillary: 146 mg/dL — ABNORMAL HIGH (ref 70–99)

## 2021-11-15 NOTE — Progress Notes (Signed)
Pt's back dressing fully changed at this time with new honeycomb. CSF drainage catheter in place at 7.5 cm. Incision site appears to have old skin sloughing off around it. Sutures are in place but appear to be pulling on skin, no current dehiscence. Image sent to Weston Brass, NP. Will continue to monitor.   Justice Rocher, RN

## 2021-11-15 NOTE — Progress Notes (Signed)
Subjective: Patient reports that she is feeling "more like myself." She had intermittent mild headaches overnight. She also reports minimal paraesthesia in her bilateral hands. No acute events overnight.   Objective: Vital signs in last 24 hours: Temp:  [97.9 F (36.6 C)-98.7 F (37.1 C)] 98.7 F (37.1 C) (01/29 0747) Pulse Rate:  [74-98] 85 (01/29 0747) Resp:  [13-19] 15 (01/29 0747) BP: (110-131)/(73-97) 117/80 (01/29 0747) SpO2:  [98 %-100 %] 98 % (01/29 0747)  Intake/Output from previous day: 01/28 0701 - 01/29 0700 In: 480 [P.O.:480] Out: 1250 [Urine:1000; Drains:250] Intake/Output this shift: No intake/output data recorded.  Physical Exam: Patient is awake, A/O X 4, conversant, and in good spirits. They are in NAD and VSS. Doing well. Speech is fluent and appropriate.She remains paraplegic. PERLA, EOMI. CNs grossly intact. Dressing is clean dry intact. Lumbar drain is patent, draining clear CSF.  Lab Results: No results for input(s): WBC, HGB, HCT, PLT in the last 72 hours. BMET No results for input(s): NA, K, CL, CO2, GLUCOSE, BUN, CREATININE, CALCIUM in the last 72 hours.  Studies/Results: DG CHEST PORT 1 VIEW  Result Date: 11/13/2021 CLINICAL DATA:  PICC line placement EXAM: PORTABLE CHEST 1 VIEW COMPARISON:  09/27/2021 FINDINGS: Frontal view of the chest was obtained with apical lordotic positioning. Right-sided PICC identified, tip overlying the right brachiocephalic vein in the region of the first rib. Cardiac silhouette is unremarkable. No airspace disease, effusion, or pneumothorax. Shrapnel overlies the upper thoracic spine. Median sternotomy wires are noted. Postsurgical changes from interval lower thoracic corpectomy and posterior fusion. IMPRESSION: 1. Right-sided PICC tip overlying the region of the right brachiocephalic vein. 2. Postsurgical changes from lower thoracic corpectomy and thoracolumbar fusion. Electronically Signed   By: Randa Ngo M.D.   On:  11/13/2021 18:56   [place] EKG SITE RITE  Result Date: 11/13/2021 If Site Rite image not attached, placement could not be confirmed due to current cardiac rhythm.   Assessment/Plan: 56 y.o. female who is s/p T10 and T11 corpectomy, with interbody arthrodesis at T9-10, T10-11 and T11-12  and posterior lateral arthrodesis at T7-8, T8-9, T9-10, T10-11, T11-12, T12-L1, L1-L2 by Dr. Arnoldo Morale on 11/04/2021. Postoperatively she developed CSF type drainage from her surgical wound and was subsequently taken back to the OR on 11/09/2021 for exploration of thoracolumbar incision and placement of a thoracic spinal drain. Lumbar drain continues to drain appropriately. No drainage noted from wound. Neurological exam remains stable.     - Continue thoracic drain. Maintain below the level of the patient. Continuous drainage.   LOS: 11 days     Marvis Moeller, DNP, NP-C 11/15/2021, 8:12 AM

## 2021-11-15 NOTE — Progress Notes (Signed)
Honeycomb dressing to back partially off from , lumbar drain catheter disconnected from site.Notified Md via telephone order to apply dressing to site and monitor CSF leakage. Pt remain alert and responsive no c/o pain or discomfort at this time.No drainage from site noted at this time. Will continue to monitor.

## 2021-11-16 LAB — COMPREHENSIVE METABOLIC PANEL
ALT: 20 U/L (ref 0–44)
AST: 23 U/L (ref 15–41)
Albumin: 2.2 g/dL — ABNORMAL LOW (ref 3.5–5.0)
Alkaline Phosphatase: 90 U/L (ref 38–126)
Anion gap: 9 (ref 5–15)
BUN: 16 mg/dL (ref 6–20)
CO2: 24 mmol/L (ref 22–32)
Calcium: 9.3 mg/dL (ref 8.9–10.3)
Chloride: 100 mmol/L (ref 98–111)
Creatinine, Ser: 0.53 mg/dL (ref 0.44–1.00)
GFR, Estimated: >60 mL/min (ref >60)
Glucose, Bld: 174 mg/dL — ABNORMAL HIGH (ref 70–99)
Potassium: 4.1 mmol/L (ref 3.5–5.1)
Sodium: 133 mmol/L — ABNORMAL LOW (ref 135–145)
Total Bilirubin: 0.2 mg/dL — ABNORMAL LOW (ref 0.3–1.2)
Total Protein: 6.4 g/dL — ABNORMAL LOW (ref 6.5–8.1)

## 2021-11-16 LAB — C-REACTIVE PROTEIN: CRP: 2.5 mg/dL — ABNORMAL HIGH (ref <1.0)

## 2021-11-16 LAB — GLUCOSE, CAPILLARY
Glucose-Capillary: 114 mg/dL — ABNORMAL HIGH (ref 70–99)
Glucose-Capillary: 133 mg/dL — ABNORMAL HIGH (ref 70–99)
Glucose-Capillary: 169 mg/dL — ABNORMAL HIGH (ref 70–99)
Glucose-Capillary: 204 mg/dL — ABNORMAL HIGH (ref 70–99)

## 2021-11-16 LAB — CK: Total CK: 26 U/L — ABNORMAL LOW (ref 38–234)

## 2021-11-16 LAB — CBC
HCT: 31.6 % — ABNORMAL LOW (ref 36.0–46.0)
Hemoglobin: 10.1 g/dL — ABNORMAL LOW (ref 12.0–15.0)
MCH: 27.5 pg (ref 26.0–34.0)
MCHC: 32 g/dL (ref 30.0–36.0)
MCV: 86.1 fL (ref 80.0–100.0)
Platelets: 316 10*3/uL (ref 150–400)
RBC: 3.67 MIL/uL — ABNORMAL LOW (ref 3.87–5.11)
RDW: 15.1 % (ref 11.5–15.5)
WBC: 9.1 10*3/uL (ref 4.0–10.5)
nRBC: 0 % (ref 0.0–0.2)

## 2021-11-16 MED ORDER — FLUCONAZOLE 150 MG PO TABS
150.0000 mg | ORAL_TABLET | Freq: Every day | ORAL | Status: AC
  Administered 2021-11-16 – 2021-11-17 (×2): 150 mg via ORAL
  Filled 2021-11-16 (×2): qty 1

## 2021-11-16 MED ORDER — BUTALBITAL-APAP-CAFFEINE 50-325-40 MG PO TABS
1.0000 | ORAL_TABLET | Freq: Four times a day (QID) | ORAL | Status: DC | PRN
  Administered 2021-11-16: 2 via ORAL
  Administered 2021-11-16: 1 via ORAL
  Administered 2021-11-17 (×4): 2 via ORAL
  Administered 2021-11-18: 1 via ORAL
  Administered 2021-11-18: 2 via ORAL
  Administered 2021-11-18: 1 via ORAL
  Filled 2021-11-16 (×7): qty 2
  Filled 2021-11-16 (×2): qty 1

## 2021-11-16 NOTE — Progress Notes (Signed)
°   11/16/21 1020  Clinical Encounter Type  Visited With Patient  Visit Type Follow-up  Consult/Referral To None   Stopped in to see patient, she was in good spirits.  Visit was stopped because the medical team came to see patient.   [place] Resident [facility]  [phone]

## 2021-11-16 NOTE — Progress Notes (Signed)
I was told in report that lumbar drain came out last night. Nurse called neurosurgery and they said will look at it today.

## 2021-11-16 NOTE — Progress Notes (Signed)
[facility]  Date of Admission:  11/04/2021      Total days of antibiotics 49   Dapto + Cefepime 12/14 >> current         ASSESSMENT: Ellen Lane is a 56 y.o. 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 She will continue IV daptomycin + cefepime through 2/5 then switch to chronic suppressive doxycycline + cefdinir on 2/6.   PICC line looks good. Incision with maceration and signs of cutaneous candidial infection. Will treat with a few days of fluconazole and place Interdry Ag sheet to skin fold here to help with further wicking at the incision (D/W. NSGY team and OK to place this).    PLAN: Place interdry Ag sheet between skin folds vertically along thoracic incision - change PRN visible soiling  Diflucan 150 mg x 2 doses PO Continue IV antibiotics as previously outlined   Principal Problem:   Status post lumbar spinal fusion Active Problems:   HIV disease (HCC)   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HCC)   Osteomyelitis of thoracic region (HCC)    (feeding supplement) PROSource Plus  30 mL Oral BID BM   baclofen  10 mg Oral TID   bictegravir-emtricitabine-tenofovir AF  1 tablet Oral Daily   bisacodyl  10 mg Rectal QPC supper   [START ON 11/23/2021] cefdinir  300 mg Oral Q12H   Chlorhexidine Gluconate Cloth  6 each Topical Daily   [START ON 11/23/2021] doxycycline  100 mg Oral Q12H   enoxaparin (LOVENOX) injection  30 mg Subcutaneous Q12H   ferrous sulfate  325 mg Oral BID WC   fluconazole  150 mg Oral Daily   gabapentin  400 mg Oral TID   insulin aspart  0-20 Units Subcutaneous TID WC & HS   mirabegron ER  50 mg Oral Daily   multivitamin with minerals  1 tablet Oral Daily   polyethylene glycol  17 g Oral QAC supper   Ensure Max Protein  11 oz Oral TID AC   senna-docusate  2 tablet Oral QAC supper   sodium chloride flush  10-40 mL Intracatheter Q12H   sodium chloride flush  3 mL  Intravenous Q12H    SUBJECTIVE: Lumbar drain fell out last night and having frontal headaches and neck pain since.    Review of Systems: Review of Systems  Constitutional:  Negative for chills, fever, malaise/fatigue and weight loss.  HENT:  Negative for sore throat.        No dental problems  Respiratory:  Negative for cough and sputum production.   Cardiovascular:  Negative for chest pain and leg swelling.  Gastrointestinal:  Negative for abdominal pain, diarrhea and vomiting.  Genitourinary:  Negative for dysuria and flank pain.  Musculoskeletal:  Negative for joint pain, myalgias and neck pain.  Skin:  Negative for rash.  Neurological:  Positive for headaches. Negative for dizziness and tingling.  Psychiatric/Behavioral:  Negative for depression.     Allergies  Allergen Reactions   Ace Inhibitors Cough   Sulfa Antibiotics Hives    OBJECTIVE: Vitals:   11/15/21 2312 11/16/21 0303 11/16/21 0700 11/16/21 1132  BP: 138/61 121/77 (!) 148/75 (!) 147/89  Pulse: 100 98  83  Resp: 20 19  12   Temp: 98.1 F (36.7 C) 99.9 F (37.7 C) 98.5 F (36.9 C) 98.6 F (37 C)  TempSrc: Oral Oral Oral Oral  SpO2:  98% 98% 100%  Weight:  Height:       Body mass index is 30.23 kg/m.  Physical Exam Cardiovascular:     Rate and Rhythm: Normal rate.  Pulmonary:     Effort: Pulmonary effort is normal.  Musculoskeletal:     Cervical back: Normal range of motion. No rigidity.  Skin:    Comments: T spine incision open to air without drainage. Surrounding macerated skin with pink/red base c/w yeast infection. Heavy skin fold from loose skin  Neurological:     Mental Status: She is alert.    Lab Results Lab Results  Component Value Date   WBC 9.1 11/16/2021   HGB 10.1 (L) 11/16/2021   HCT 31.6 (L) 11/16/2021   MCV 86.1 11/16/2021   PLT 316 11/16/2021    Lab Results  Component Value Date   CREATININE 0.53 11/16/2021   BUN 16 11/16/2021   NA 133 (L) 11/16/2021   K 4.1  11/16/2021   CL 100 11/16/2021   CO2 24 11/16/2021    Lab Results  Component Value Date   ALT 20 11/16/2021   AST 23 11/16/2021   ALKPHOS 90 11/16/2021   BILITOT 0.2 (L) 11/16/2021     Microbiology: No results found for this or any previous visit (from the past 240 hour(s)).  [name], MSN, NP-C [facility] for Infectious [place] [facility] Pager: [phone]  11/16/2021

## 2021-11-16 NOTE — Progress Notes (Signed)
Subjective: The patient is alert and pleasant.  She complains of a headache since her drain was pulled out.  Objective: Vital signs in last 24 hours: Temp:  [98.1 F (36.7 C)-99.9 F (37.7 C)] 98.6 F (37 C) (01/30 1132) Pulse Rate:  [79-100] 83 (01/30 1132) Resp:  [12-23] 12 (01/30 1132) BP: (99-148)/(61-89) 147/89 (01/30 1132) SpO2:  [98 %-100 %] 100 % (01/30 1132) Estimated body mass index is 30.23 kg/m as calculated from the following:   Height as of this encounter: 5\' 1"  (1.549 m).   Weight as of this encounter: 72.6 kg.   Intake/Output from previous day: 01/29 0701 - 01/30 0700 In: -  Out: 2297 [Urine:2200; Drains:97] Intake/Output this shift: Total I/O In: 240 [P.O.:240] Out: -   Physical exam the patient is alert and pleasant.  There is no wound drainage.  She is paraplegic.  Lab Results: Recent Labs    11/16/21 1005  WBC 9.1  HGB 10.1*  HCT 31.6*  PLT 316   BMET Recent Labs    11/16/21 1005  NA 133*  K 4.1  CL 100  CO2 24  GLUCOSE 174*  BUN 16  CREATININE 0.53  CALCIUM 9.3    Studies/Results: No results found.  Assessment/Plan: T10 and T11 osteomyelitis, thoracic instability, status post thoracolumbar instrumentation and fusion, CSF fistula: I have discussed situation with the patient.  I am not surprised she has a headache since the drain was pulled out.  The leakage from the drainage exit site should resolve in the next day or 2.  I have answered all her questions.  I will asked Dr. Christella Noa to see the patient as I will be out of town for the rest of the week.  LOS: 12 days     Ellen Lane 11/16/2021, 1:03 PM     Patient ID: Ellen Lane, female   DOB: [birth year], 56 y.o.   MRN: [id number]

## 2021-11-16 NOTE — Progress Notes (Signed)
Dressing to incision site remain intact no drainage noted at this time , Pin medication given for c/o of headache. Remain alert and responsive.

## 2021-11-16 NOTE — Progress Notes (Signed)
PT Cancellation Note  Patient Details Name: Ellen Lane MRN: [id number] DOB: [birth year]   Cancelled Treatment:    Reason Eval/Treat Not Completed: Pain limiting ability to participate; patient with severe headache, RN gave meds, but no relief yet.  Did participate in mobility for bed bath this am.  Will attempt to see another day due to pain and to allow continued monitoring for further CSF leak.   [name] 11/16/2021, 1:09 PM [name], PT Acute Rehabilitation Services Pager:[phone] Office:[phone] 11/16/2021

## 2021-11-16 NOTE — Progress Notes (Signed)
OT Cancellation Note  Patient Details Name: Dusti Tetro MRN: [id number] DOB: [birth year]   Cancelled Treatment:    Reason Eval/Treat Not Completed: Medical issues which prohibited therapy (10/10 headache, concern for CSF leak)  Merri Ray Loranda Mastel 11/16/2021, 11:55 AM  Jesse Sans OTR/L Acute Rehabilitation Services Pager: [phone] Office: [phone]

## 2021-11-16 NOTE — Progress Notes (Signed)
° °  Providing Compassionate, Quality Care - Together   Subjective: Patient reports headache overnight, not responding to Percocet. Nursing staff report lumbar drain pulled out overnight. No CSF noted coming   Objective: Vital signs in last 24 hours: Temp:  [97.7 F (36.5 C)-99.9 F (37.7 C)] 98.5 F (36.9 C) (01/30 0700) Pulse Rate:  [79-100] 98 (01/30 0303) Resp:  [18-23] 19 (01/30 0303) BP: (99-148)/(61-78) 148/75 (01/30 0700) SpO2:  [98 %-100 %] 98 % (01/30 0700)  Intake/Output from previous day: 01/29 0701 - 01/30 0700 In: -  Out: 2297 [Urine:2200; Drains:97] Intake/Output this shift: No intake/output data recorded.  Patient is alert and oriented PERRLA Speech clear Lumbar drain removed. Incision is dry and intact, with some slough. Wound cleansed and left open to air.  Lab Results: No results for input(s): WBC, HGB, HCT, PLT in the last 72 hours. BMET No results for input(s): NA, K, CL, CO2, GLUCOSE, BUN, CREATININE, CALCIUM in the last 72 hours.  Studies/Results: No results found.  Assessment/Plan: Patient underwent T10 and T11 corpectomy, with interbody arthrodesis at T9-10, T10-11 and T11-12  and posterior lateral arthrodesis at T7-8, T8-9, T9-10, T10-11, T11-12, T12-L1, L1-L2 by Dr. Arnoldo Morale on 11/04/2021. She developed drainage from her surgical wound that appeared to be CSF. She was taken back to the OR on 11/09/2021 for exploration of thoracolumbar incision and placement of a thoracic spinal drain. No obvious hole was found in the dura. Thoracic drain pulled out overnight on 11/15/2021. No drainage noted from wound since removal of drain. Patient with headache and neck pain today. No fever.     LOS: 12 days   -Fioricet added for CSF headache -Monitor incision for drainage   [name], DNP, AGNP-C Nurse Practitioner  [facility] [place]. [address], [address], [place], [place] [address] P: [phone]     F:  [phone]  11/16/2021, 10:03 AM

## 2021-11-17 ENCOUNTER — Other Ambulatory Visit (HOSPITAL_COMMUNITY): Payer: Self-pay

## 2021-11-17 LAB — GLUCOSE, CAPILLARY
Glucose-Capillary: 103 mg/dL — ABNORMAL HIGH (ref 70–99)
Glucose-Capillary: 126 mg/dL — ABNORMAL HIGH (ref 70–99)
Glucose-Capillary: 102 mg/dL — ABNORMAL HIGH (ref 70–99)
Glucose-Capillary: 133 mg/dL — ABNORMAL HIGH (ref 70–99)
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
## 2021-11-17 NOTE — Progress Notes (Signed)
° °  Providing Compassionate, Quality Care - Together   Subjective: Patient reports significant improvement of her headache from yesterday. She feels like the Fioricet helps.  Objective: Vital signs in last 24 hours: Temp:  [97.5 F (36.4 C)-98.6 F (37 C)] 98.6 F (37 C) (01/31 0700) Pulse Rate:  [85-103] 90 (01/31 0700) Resp:  [14-20] 14 (01/31 0700) BP: (103-159)/(70-90) 116/78 (01/31 0700) SpO2:  [95 %-100 %] 98 % (01/31 0700)  Intake/Output from previous day: 01/30 0701 - 01/31 0700 In: 420 [P.O.:420] Out: 3000 [Urine:3000] Intake/Output this shift: Total I/O In: 360 [P.O.:360] Out: 350 [Urine:350]  Patient is alert and oriented PERRLA Speech clear Incision is dry and intact, with some slough.  Lab Results: Recent Labs    11/16/21 1005  WBC 9.1  HGB 10.1*  HCT 31.6*  PLT 316   BMET Recent Labs    11/16/21 1005  NA 133*  K 4.1  CL 100  CO2 24  GLUCOSE 174*  BUN 16  CREATININE 0.53  CALCIUM 9.3    Studies/Results: No results found.  Assessment/Plan: Patient underwent T10 and T11 corpectomy, with interbody arthrodesis at T9-10, T10-11 and T11-12  and posterior lateral arthrodesis at T7-8, T8-9, T9-10, T10-11, T11-12, T12-L1, L1-L2 by Dr. Arnoldo Morale on 11/04/2021. She developed drainage from her surgical wound that appeared to be CSF. She was taken back to the OR on 11/09/2021 for exploration of thoracolumbar incision and placement of a thoracic spinal drain. No obvious hole was found in the dura. Thoracic drain pulled out overnight on 11/15/2021. No drainage noted from wound since removal of drain. Patient with headache and neck pain today. No fever.   LOS: 13 days   -If incision remains dry overnight, we will discharge home tomorrow.   [name], DNP, AGNP-C Nurse Practitioner  [facility] [place] [address], [address], [place], [place] [address] P: [phone]     F: [phone]  11/17/2021, 12:20 PM

## 2021-11-17 NOTE — Progress Notes (Signed)
[facility] for Infectious Disease  Date of Admission:  11/04/2021      Total days of antibiotics 50   Dapto + Cefepime 12/14 >> current         ASSESSMENT: Ellen Lane is a 56 y.o. 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 She will continue IV daptomycin + cefepime through 2/5 then switch to chronic suppressive doxycycline + cefdinir on 2/6.   PICC line looks good. Incision looks nice and dry with Ag sheet appropriately placed. Hopeful this and diflucan can help dry it up and keep it that way.   Headaches improved today - hopefully will continue to improve for her.    PLAN: Continue interdry Ag sheet between skin folds vertically along thoracic incision - change PRN visible soiling  Continue diflucan one more dose  Continue IV antibiotics as previously outlined through 2/5 with transition to oral suppression 2/6    Principal Problem:   Status post lumbar spinal fusion Active Problems:   HIV disease (HCC)   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HCC)   Osteomyelitis of thoracic region (HCC)    (feeding supplement) PROSource Plus  30 mL Oral BID BM   baclofen  10 mg Oral TID   bictegravir-emtricitabine-tenofovir AF  1 tablet Oral Daily   bisacodyl  10 mg Rectal QPC supper   [START ON 11/23/2021] cefdinir  300 mg Oral Q12H   Chlorhexidine Gluconate Cloth  6 each Topical Daily   [START ON 11/23/2021] doxycycline  100 mg Oral Q12H   enoxaparin (LOVENOX) injection  30 mg Subcutaneous Q12H   ferrous sulfate  325 mg Oral BID WC   gabapentin  400 mg Oral TID   insulin aspart  0-20 Units Subcutaneous TID WC & HS   mirabegron ER  50 mg Oral Daily   multivitamin with minerals  1 tablet Oral Daily   polyethylene glycol  17 g Oral QAC supper   Ensure Max Protein  11 oz Oral TID AC   senna-docusate  2 tablet Oral QAC supper   sodium chloride flush  10-40 mL Intracatheter Q12H   sodium chloride flush  3 mL Intravenous  Q12H    SUBJECTIVE: Headaches seem much less severe for her today.    Review of Systems: Review of Systems  Constitutional:  Negative for chills, fever, malaise/fatigue and weight loss.  HENT:  Negative for sore throat.        No dental problems  Respiratory:  Negative for cough and sputum production.   Cardiovascular:  Negative for chest pain and leg swelling.  Gastrointestinal:  Negative for abdominal pain, diarrhea and vomiting.  Genitourinary:  Negative for dysuria and flank pain.  Musculoskeletal:  Negative for joint pain, myalgias and neck pain.  Skin:  Negative for rash.  Neurological:  Positive for headaches (improved and less intense/frequent). Negative for dizziness and tingling.  Psychiatric/Behavioral:  Negative for depression.     Allergies  Allergen Reactions   Ace Inhibitors Cough   Sulfa Antibiotics Hives    OBJECTIVE: Vitals:   11/16/21 1459 11/16/21 1912 11/16/21 2300 11/17/21 0700  BP: (!) 159/90 116/70 103/70 116/78  Pulse: 85 (!) 103 90 90  Resp:  20 20 14   Temp: (!) 97.5 F (36.4 C)  98.6 F (37 C) 98.6 F (37 C)  TempSrc: Oral  Oral Oral  SpO2: 100% 95% 96% 98%  Weight:      Height:  Body mass index is 30.23 kg/m.  Physical Exam Cardiovascular:     Rate and Rhythm: Normal rate.  Pulmonary:     Effort: Pulmonary effort is normal.  Musculoskeletal:     Cervical back: Normal range of motion. No rigidity.  Skin:    Comments: T spine incision with interdry Ag placed against it between skin folds. Clean and without drainage.   Neurological:     Mental Status: She is alert.    Lab Results Lab Results  Component Value Date   WBC 9.1 11/16/2021   HGB 10.1 (L) 11/16/2021   HCT 31.6 (L) 11/16/2021   MCV 86.1 11/16/2021   PLT 316 11/16/2021    Lab Results  Component Value Date   CREATININE 0.53 11/16/2021   BUN 16 11/16/2021   NA 133 (L) 11/16/2021   K 4.1 11/16/2021   CL 100 11/16/2021   CO2 24 11/16/2021    Lab Results   Component Value Date   ALT 20 11/16/2021   AST 23 11/16/2021   ALKPHOS 90 11/16/2021   BILITOT 0.2 (L) 11/16/2021     Microbiology: No results found for this or any previous visit (from the past 240 hour(s)).  [name], MSN, NP-C [facility] for Infectious [place] [facility] Pager: [phone]  11/17/2021

## 2021-11-18 ENCOUNTER — Other Ambulatory Visit (HOSPITAL_COMMUNITY): Payer: Self-pay

## 2021-11-18 LAB — GLUCOSE, CAPILLARY
Glucose-Capillary: 96 mg/dL (ref 70–99)
Glucose-Capillary: 175 mg/dL — ABNORMAL HIGH (ref 70–99)
Glucose-Capillary: 151 mg/dL — ABNORMAL HIGH (ref 70–99)
Glucose-Capillary: 164 mg/dL — ABNORMAL HIGH (ref 70–99)

## 2021-11-18 MED ORDER — ENSURE MAX PROTEIN PO LIQD: 11.0000 [oz_av] | Freq: Three times a day (TID) | ORAL | Status: DC

## 2021-11-18 MED ORDER — CEFDINIR 300 MG PO CAPS
300.0000 mg | ORAL_CAPSULE | Freq: Two times a day (BID) | ORAL | 0 refills | Status: DC
  Filled 2021-11-18: qty 46, 23d supply, fill #0

## 2021-11-18 MED ORDER — OXYCODONE HCL 5 MG PO TABS
5.0000 mg | ORAL_TABLET | Freq: Four times a day (QID) | ORAL | 0 refills | Status: DC | PRN
  Filled 2021-11-18: qty 20, 5d supply, fill #0

## 2021-11-18 MED ORDER — GABAPENTIN 400 MG PO CAPS
ORAL_CAPSULE | ORAL | 2 refills | Status: DC
  Filled 2021-11-18: qty 90, 30d supply, fill #0
  Filled 2022-01-01: qty 90, 30d supply, fill #1
  Filled 2022-02-04: qty 90, 30d supply, fill #2

## 2021-11-18 MED ORDER — DOXYCYCLINE HYCLATE 100 MG PO TABS
100.0000 mg | ORAL_TABLET | Freq: Two times a day (BID) | ORAL | 0 refills | Status: DC
  Filled 2021-11-18: qty 46, 23d supply, fill #0

## 2021-11-18 MED ORDER — DOXYCYCLINE HYCLATE 100 MG PO TABS
100.0000 mg | ORAL_TABLET | Freq: Two times a day (BID) | ORAL | 0 refills | Status: AC
  Filled 2021-11-18: qty 56, 28d supply, fill #0

## 2021-11-18 MED ORDER — CEFDINIR 300 MG PO CAPS
300.0000 mg | ORAL_CAPSULE | Freq: Two times a day (BID) | ORAL | 0 refills | Status: AC
  Filled 2021-11-18: qty 56, 28d supply, fill #0

## 2021-11-18 MED ORDER — BUTALBITAL-APAP-CAFFEINE 50-325-40 MG PO TABS
1.0000 | ORAL_TABLET | Freq: Four times a day (QID) | ORAL | 0 refills | Status: DC | PRN
  Filled 2021-11-18: qty 14, 3d supply, fill #0

## 2021-11-18 NOTE — Progress Notes (Signed)
Pt with d/c orders home with [facility]. Pt home meds stored in pharmacy returned to pt. PICC removed by IV team. Assessment complete pt is stable. Medications delivered to room. Discharge instructions provided to pt all questions answered. Pt discharge paperwork placed in envelope at nurses station. Pt belongings packed up. Back brace applied. Awaiting [facility] for transport.

## 2021-11-18 NOTE — Progress Notes (Signed)
[facility]  Date of Admission:  11/04/2021      Total days of antibiotics 51   Dapto + Cefepime 12/14 >> current         ASSESSMENT: Ellen Lane is a 56 y.o. 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 She is ready for discharge today - with her original end date for IV therapy 2/5 we will go ahead and convert her over to oral suppression with doxycycline + cefdinir. She has a follow up with Dr. Gale Journey arranged for 3/1. Would remove PICC Line prior to hospital D/C. Will see if we can get TOC to dispense her antibiotics as well prior to her leaving.   Intertriginous candida - interdry Ag sheet clean and dry to incision. Treated with 2d course fluconazole.   Headaches continue to improve  PLAN: Remove picc line prior to D/C  Start oral doxycycline and cefdinir this evening (if she discharges soon) or 2/2 AM  Will try to get TOC to dispense the doxy and cefdinir today prior to discharge   Follow up arranged with Dr. Gale Journey on 3/1 in ID clinic.    Principal Problem:   Status post lumbar spinal fusion Active Problems:   HIV disease (HCC)   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HCC)   Osteomyelitis of thoracic region (HCC)    (feeding supplement) PROSource Plus  30 mL Oral BID BM   baclofen  10 mg Oral TID   bictegravir-emtricitabine-tenofovir AF  1 tablet Oral Daily   bisacodyl  10 mg Rectal QPC supper   [START ON 11/23/2021] cefdinir  300 mg Oral Q12H   Chlorhexidine Gluconate Cloth  6 each Topical Daily   [START ON 11/23/2021] doxycycline  100 mg Oral Q12H   enoxaparin (LOVENOX) injection  30 mg Subcutaneous Q12H   ferrous sulfate  325 mg Oral BID WC   gabapentin  400 mg Oral TID   insulin aspart  0-20 Units Subcutaneous TID WC & HS   mirabegron ER  50 mg Oral Daily   multivitamin with minerals  1 tablet Oral Daily   polyethylene glycol  17 g Oral QAC supper   Ensure Max Protein  11 oz Oral TID  AC   senna-docusate  2 tablet Oral QAC supper   sodium chloride flush  10-40 mL Intracatheter Q12H   sodium chloride flush  3 mL Intravenous Q12H    SUBJECTIVE: Headaches continue to improve. She is excited to have her PICC Line out before she goes home.    Review of Systems: Review of Systems  Constitutional:  Negative for chills, fever, malaise/fatigue and weight loss.  HENT:  Negative for sore throat.        No dental problems  Respiratory:  Negative for cough and sputum production.   Cardiovascular:  Negative for chest pain and leg swelling.  Gastrointestinal:  Negative for abdominal pain, diarrhea and vomiting.  Genitourinary:  Negative for dysuria and flank pain.  Musculoskeletal:  Negative for joint pain, myalgias and neck pain.  Skin:  Negative for rash.  Neurological:  Positive for headaches (improved and less intense/frequent). Negative for dizziness and tingling.  Psychiatric/Behavioral:  Negative for depression.     Allergies  Allergen Reactions   Ace Inhibitors Cough   Sulfa Antibiotics Hives    OBJECTIVE: Vitals:   11/04/21 0955 11/18/21 0358 11/18/21 0730 11/18/21 1033  BP:  107/73 (!) 141/85 104/74  Pulse:  81 77 99  Resp:  13 14 18   Temp:  98.6 F (37 C) 99 F (37.2 C) 98.4 F (36.9 C)  TempSrc:  Oral Oral Oral  SpO2:  99% 99% 98%  Weight: 72.6 kg     Height: 5\' 1"  (1.549 m)      Body mass index is 30.23 kg/m.  Physical Exam Cardiovascular:     Rate and Rhythm: Normal rate.  Pulmonary:     Effort: Pulmonary effort is normal.  Musculoskeletal:     Cervical back: Normal range of motion. No rigidity.  Skin:    Comments: T spine incision with interdry Ag placed against it between skin folds. Clean and without drainage.   Neurological:     Mental Status: She is alert.    Lab Results Lab Results  Component Value Date   WBC 9.1 11/16/2021   HGB 10.1 (L) 11/16/2021   HCT 31.6 (L) 11/16/2021   MCV 86.1 11/16/2021   PLT 316 11/16/2021     Lab Results  Component Value Date   CREATININE 0.53 11/16/2021   BUN 16 11/16/2021   NA 133 (L) 11/16/2021   K 4.1 11/16/2021   CL 100 11/16/2021   CO2 24 11/16/2021    Lab Results  Component Value Date   ALT 20 11/16/2021   AST 23 11/16/2021   ALKPHOS 90 11/16/2021   BILITOT 0.2 (L) 11/16/2021     Microbiology: No results found for this or any previous visit (from the past 240 hour(s)).  [name], MSN, NP-C [facility] Pager: [phone]  11/18/2021

## 2021-11-18 NOTE — TOC Transition Note (Signed)
Transition of Care [facility]) - CM/SW Discharge Note   Patient Details  Name: Ellen Lane MRN: [id number] Date of Birth: 1966-06-25  Transition of Care [facility]) CM/SW Contact:  Angelita Ingles, RN Phone Number:[phone]  11/18/2021, 3:17 PM   Clinical Narrative:    [facility] transport has been set up for patient discharging home . [facility] made aware of d/c for [facility] services. D/c  packet has been placed at nurses station No other needs noted at this time. TOC will sign off.          Patient Goals and CMS Choice        Discharge Placement                       Discharge Plan and Services                                     Social Determinants of Health (SDOH) Interventions     Readmission Risk Interventions No flowsheet data found.

## 2021-11-18 NOTE — TOC Progression Note (Signed)
Transition of Care [facility]) - Progression Note    Patient Details  Name: Ellen Lane MRN: [id number] Date of Birth: November 13, 1965  [facility] CM/SW Contact  Angelita Ingles, RN Phone Number:[phone]  11/18/2021, 1:41 PM  Clinical Narrative:    CM following for disposition needs. Patient is discharging and will no longer need home infusion. Hh has been previously set up with [facility]. CM has made Corene Cornea with [facility] aware that patient will discharge today. [name] with [facility] has also been made aware that patient will not be going home on IV antibiotics. Per nurse the  patient is to receive 2pm dose of IV antibiotics and then will have PICC discontinued. Patient will need [facility] transport home but is not ready for CM to set up transportation yet. Nurse to notify CM when patient is ready to set up transport.        Expected Discharge Plan and Services           Expected Discharge Date: 11/18/21                                     Social Determinants of Health (SDOH) Interventions    Readmission Risk Interventions No flowsheet data found.
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
## 2021-11-18 NOTE — Discharge Instructions (Signed)
Place interdry Ag sheet between skin folds vertically along thoracic incision - change PRN visible soiling

## 2021-11-18 NOTE — Care Management Important Message (Signed)
Important Message  Patient Details  Name: Ellen Lane MRN: [id number] Date of Birth: Oct 22, 1965   Medicare Important Message Given:  Yes     Hannah Beat 11/18/2021, 1:32 PM

## 2021-11-18 NOTE — Discharge Summary (Addendum)
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 tablet Commonly known as: FIORICET Take 1-2 tablets by mouth every 6 (six) hours as needed for headache.   cefdinir 300 MG capsule Commonly known as: OMNICEF Take 1 capsule (300 mg total) by mouth 2 (two) times daily for 28 days.   doxycycline 100 MG tablet Commonly known as: VIBRA-TABS Take 1 tablet (100 mg total) by mouth 2 (two) times daily for 28 days.   Ensure Max Protein Liqd Take 330 mLs (11 oz total) by mouth 3 (three) times daily before  meals.   gabapentin 400 MG capsule Commonly known as: NEURONTIN Take one capsule (400 mg dose) by mouth 3 (three) times a day.   glucose blood test strip CHECK BLOOD SUGAR FOUR TIMES DAILY AS DIRECTED DX: E11.9   OneTouch Verio test strip  Generic drug: glucose blood USE 1 STRIP TO CHECK BLOOD SUGAR TWICE DAILY   insulin aspart 100 UNIT/ML injection Commonly known as: novoLOG Inject 0-9 Units into the skin 3 (three) times daily with meals.   Insulin Pen Needle 32G X 6 MM Misc USE TO INJECT  SUBCUTANEOUSLY ONE TIME  DAILY   mirabegron ER 50 MG Tb24 tablet Commonly known as: MYRBETRIQ Take 1 tablet (50 mg total) by mouth daily.   multivitamin with minerals Tabs tablet Take 1 tablet by mouth daily.   OneTouch Delica Plus [id number] Misc USE AS DIRECTED TWICE DAILY TO TEST BLOOD SUGAR   OneTouch Verio Flex System w/Device Kit USE AS DIRECTED TWICE DAILY.   oxyCODONE 5 MG immediate release tablet Commonly known as: Oxy IR/ROXICODONE Take 1 tablet (5 mg total) by mouth every 6 (six) hours as needed for moderate pain ((score 4 to 6)).   polyethylene glycol powder 17 GM/SCOOP powder Commonly known as: GLYCOLAX/MIRALAX Take 17 g by mouth daily as needed for mild constipation.   polyethylene glycol 17 g packet Commonly known as: MIRALAX / GLYCOLAX Take 17 g by mouth daily before supper.   senna-docusate 8.6-50 MG tablet Commonly known as: Senokot-S Take 2 tablets by mouth daily before supper.   zinc oxide 20 % ointment Apply topically as needed for irritation.   zinc sulfate 220 (50 Zn) MG capsule Take 1 capsule (220 mg total) by mouth daily.        Follow-up Information     [facility] Follow up.   Why: Your home health has been set up with [facility]. The office will call you with start of care information. If you have any questions please call tythe number listed above. Contact information: [address] [place]  [address] [phone]        Jabier Mutton, MD Follow up on 12/16/2021.   Specialty: Infectious Diseases Why: Hospital Discharge Follow Up - 11:30 am follow up Contact information: [address] [place] [address] [phone]         Patricia Nettle, NP. Go on 11/24/2021.   Specialty: Neurosurgery Why: Appointment for suture removal is 11/24/2021 at 12:00 pm. Contact information: [address] [place] [address] [phone]                 Signed: [name], DNP, AGNP-C Nurse Practitioner  [facility] [place] [address], [address], [place], [place] [address] P: [phone]     F: [phone]  11/18/2021, 2:12 PM

## 2021-11-20 ENCOUNTER — Other Ambulatory Visit (HOSPITAL_COMMUNITY): Payer: Self-pay

## 2021-11-20 MED ORDER — OZEMPIC (2 MG/DOSE) 8 MG/3ML ~~LOC~~ SOPN
PEN_INJECTOR | SUBCUTANEOUS | 5 refills | Status: DC
  Filled 2021-11-20: qty 3, 28d supply, fill #0
  Filled 2021-12-14: qty 3, 28d supply, fill #1
  Filled 2022-01-18: qty 3, 28d supply, fill #2
  Filled 2022-02-15: qty 3, 28d supply, fill #3
  Filled 2022-03-18: qty 3, 28d supply, fill #4
  Filled 2022-04-19: qty 3, 28d supply, fill #5

## 2021-11-21 ENCOUNTER — Other Ambulatory Visit (HOSPITAL_COMMUNITY): Payer: Self-pay

## 2021-11-23 ENCOUNTER — Other Ambulatory Visit (HOSPITAL_COMMUNITY): Payer: Self-pay

## 2021-11-25 ENCOUNTER — Other Ambulatory Visit (HOSPITAL_COMMUNITY): Payer: Self-pay

## 2021-11-25 MED ORDER — FLUCONAZOLE 150 MG PO TABS
ORAL_TABLET | ORAL | 0 refills | Status: DC
  Filled 2021-11-25: qty 2, 2d supply, fill #0

## 2021-11-26 ENCOUNTER — Other Ambulatory Visit (HOSPITAL_COMMUNITY): Payer: Self-pay

## 2021-11-26 MED ORDER — OXYCODONE-ACETAMINOPHEN 5-325 MG PO TABS
ORAL_TABLET | ORAL | 0 refills | Status: DC
  Filled 2021-11-26: qty 60, 30d supply, fill #0

## 2021-11-28 ENCOUNTER — Other Ambulatory Visit (HOSPITAL_COMMUNITY): Payer: Self-pay

## 2021-11-28 MED ORDER — ENSURE MAX PROTEIN PO LIQD: ORAL | 5 refills | Status: DC

## 2021-11-30 ENCOUNTER — Telehealth: Payer: Self-pay

## 2021-11-30 NOTE — Telephone Encounter (Signed)
Patient called stating her pcp would like for her to start drinking ensure three times daily. Patient wanted to make sure it is ok to take with the [place]. Per Cassie patient should take the ensure and biktarvy together with some food if she can. Patient informed and verbalized understanding. [place] President T Brooks Sailors

## 2021-12-01 ENCOUNTER — Encounter (HOSPITAL_COMMUNITY): Payer: Self-pay | Admitting: Neurosurgery

## 2021-12-10 LAB — FUNGAL ORGANISM REFLEX

## 2021-12-10 LAB — ACID FAST CULTURE WITH REFLEXED SENSITIVITIES (MYCOBACTERIA)

## 2021-12-10 LAB — ACID FAST SMEAR (AFB, MYCOBACTERIA)

## 2021-12-10 LAB — FUNGUS CULTURE RESULT

## 2021-12-10 LAB — FUNGUS CULTURE WITH STAIN

## 2021-12-14 ENCOUNTER — Other Ambulatory Visit (HOSPITAL_COMMUNITY): Payer: Self-pay

## 2021-12-14 ENCOUNTER — Other Ambulatory Visit: Payer: Self-pay | Admitting: Infectious Disease

## 2021-12-14 DIAGNOSIS — B20 Human immunodeficiency virus [HIV] disease: Secondary | ICD-10-CM

## 2021-12-14 NOTE — Telephone Encounter (Signed)
Patient has appointment on 3/1

## 2021-12-16 ENCOUNTER — Other Ambulatory Visit (HOSPITAL_COMMUNITY): Payer: Self-pay

## 2021-12-16 ENCOUNTER — Inpatient Hospital Stay: Payer: Medicare Other | Admitting: Internal Medicine

## 2021-12-16 MED ORDER — BIKTARVY 50-200-25 MG PO TABS
1.0000 | ORAL_TABLET | Freq: Every day | ORAL | 0 refills | Status: DC
  Filled 2021-12-16: qty 30, 30d supply, fill #0

## 2021-12-22 ENCOUNTER — Other Ambulatory Visit (HOSPITAL_COMMUNITY): Payer: Self-pay

## 2021-12-24 ENCOUNTER — Ambulatory Visit (INDEPENDENT_AMBULATORY_CARE_PROVIDER_SITE_OTHER): Payer: Medicare Other | Admitting: Internal Medicine

## 2021-12-24 ENCOUNTER — Other Ambulatory Visit: Payer: Self-pay

## 2021-12-24 ENCOUNTER — Other Ambulatory Visit (HOSPITAL_COMMUNITY): Payer: Self-pay

## 2021-12-24 ENCOUNTER — Encounter: Payer: Self-pay | Admitting: Internal Medicine

## 2021-12-24 VITALS — BP 114/80 | HR 100 | Temp 97.2°F | Resp 16 | Ht 63.0 in | Wt 160.0 lb

## 2021-12-24 DIAGNOSIS — T847XXD Infection and inflammatory reaction due to other internal orthopedic prosthetic devices, implants and grafts, subsequent encounter: Secondary | ICD-10-CM | POA: Diagnosis not present

## 2021-12-24 DIAGNOSIS — B20 Human immunodeficiency virus [HIV] disease: Secondary | ICD-10-CM

## 2021-12-24 DIAGNOSIS — M4620 Osteomyelitis of vertebra, site unspecified: Secondary | ICD-10-CM

## 2021-12-24 MED ORDER — DOXYCYCLINE HYCLATE 100 MG PO TABS
100.0000 mg | ORAL_TABLET | Freq: Two times a day (BID) | ORAL | 3 refills | Status: DC
Start: 2021-12-24 — End: 2022-05-17
  Filled 2021-12-24: qty 180, 90d supply, fill #0
  Filled 2022-03-23: qty 180, 90d supply, fill #1

## 2021-12-24 MED ORDER — CEFDINIR 300 MG PO CAPS
300.0000 mg | ORAL_CAPSULE | Freq: Two times a day (BID) | ORAL | 3 refills | Status: DC
  Filled 2021-12-24: qty 180, 90d supply, fill #0
  Filled 2022-03-23: qty 180, 90d supply, fill #1

## 2021-12-24 NOTE — Addendum Note (Signed)
Addended by: Theresia Majors A on: 12/24/2021 10:45 AM ? ? Modules accepted: Orders ? ?

## 2021-12-24 NOTE — Addendum Note (Signed)
Addended by: Theresia Majors A on: 12/24/2021 10:38 AM ? ? Modules accepted: Orders ? ?

## 2021-12-24 NOTE — Patient Instructions (Addendum)
Restart doxycycline and cefdinir ?The plan is indefinite antibiotics ? ? ?I agree with HH twice a week for now ? ? ?Will get ct scan scheduled for your thoracic and lumbar spine to make sure there is no deeper abscess ? ?Will also check labs today and each visit with [place] to make sure your numbers are improving ? ? ?If numbers are improving back on antibiotics, and ct indicates no abscess, will continue as is with ongoing antiobics ? ? ?See me (or dr Tommy Medal) 1-2 weeks after the ct is done ?
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
## 2021-12-25 ENCOUNTER — Other Ambulatory Visit (HOSPITAL_COMMUNITY): Payer: Self-pay

## 2021-12-26 LAB — COMPLETE METABOLIC PANEL WITH GFR
AG Ratio: 1.1 (calc) (ref 1.0–2.5)
ALT: 20 U/L (ref 6–29)
AST: 15 U/L (ref 10–35)
Albumin: 3.6 g/dL (ref 3.6–5.1)
Alkaline phosphatase (APISO): 111 U/L (ref 37–153)
BUN/Creatinine Ratio: 19 (calc) (ref 6–22)
BUN: 7 mg/dL (ref 7–25)
CO2: 23 mmol/L (ref 20–32)
Calcium: 10 mg/dL (ref 8.6–10.4)
Chloride: 102 mmol/L (ref 98–110)
Creat: 0.37 mg/dL — ABNORMAL LOW (ref 0.50–1.03)
Globulin: 3.4 g/dL (calc) (ref 1.9–3.7)
Glucose, Bld: 66 mg/dL (ref 65–99)
Potassium: 4.5 mmol/L (ref 3.5–5.3)
Sodium: 139 mmol/L (ref 135–146)
Total Bilirubin: 0.3 mg/dL (ref 0.2–1.2)
Total Protein: 7 g/dL (ref 6.1–8.1)
eGFR: 120 mL/min/{1.73_m2} (ref >=60)

## 2021-12-26 LAB — CBC
HCT: 42 % (ref 35.0–45.0)
Hemoglobin: 13.1 g/dL (ref 11.7–15.5)
MCH: 26.1 pg — ABNORMAL LOW (ref 27.0–33.0)
MCHC: 31.2 g/dL — ABNORMAL LOW (ref 32.0–36.0)
MCV: 83.8 fL (ref 80.0–100.0)
MPV: 11.9 fL (ref 7.5–12.5)
Platelets: 244 10*3/uL (ref 140–400)
RBC: 5.01 10*6/uL (ref 3.80–5.10)
RDW: 15 % (ref 11.0–15.0)
WBC: 7.4 10*3/uL (ref 3.8–10.8)

## 2021-12-26 LAB — T-HELPER CELLS (CD4) COUNT (NOT AT ARMC)
Absolute CD4: 752 cells/uL (ref 490–1740)
CD4 T Helper %: 34 % (ref 30–61)
Total lymphocyte count: 2212 cells/uL (ref 850–3900)

## 2021-12-26 LAB — HIV-1 RNA QUANT-NO REFLEX-BLD
HIV 1 RNA Quant: 1230 Copies/mL — ABNORMAL HIGH
HIV-1 RNA Quant, Log: 3.09 Log cps/mL — ABNORMAL HIGH

## 2021-12-26 LAB — C-REACTIVE PROTEIN: CRP: 5.3 mg/L (ref <8.0)

## 2021-12-30 ENCOUNTER — Other Ambulatory Visit: Payer: Self-pay

## 2021-12-30 ENCOUNTER — Telehealth: Payer: Self-pay

## 2021-12-30 DIAGNOSIS — B20 Human immunodeficiency virus [HIV] disease: Secondary | ICD-10-CM

## 2021-12-30 NOTE — Telephone Encounter (Signed)
Patient aware of results. Patient scheduled for repeat hiv rna on 4/5. ? ? ? ?Ellen Lane P [name], CMA ? ?

## 2021-12-30 NOTE — Telephone Encounter (Signed)
-----   Message from Jabier Mutton, MD sent at 12/30/2021 12:32 PM EDT ----- ?Hi team. Please have her come back in around 3-4 weeks to repeat hiv rna. Not sure why it is so high. Thank you ?

## 2022-01-01 ENCOUNTER — Other Ambulatory Visit (HOSPITAL_COMMUNITY): Payer: Self-pay

## 2022-01-01 ENCOUNTER — Inpatient Hospital Stay: Payer: Medicare Other | Admitting: Internal Medicine

## 2022-01-06 ENCOUNTER — Other Ambulatory Visit: Payer: Self-pay

## 2022-01-06 ENCOUNTER — Other Ambulatory Visit (HOSPITAL_COMMUNITY): Payer: Self-pay | Admitting: Internal Medicine

## 2022-01-06 ENCOUNTER — Ambulatory Visit (HOSPITAL_COMMUNITY)
Admission: RE | Admit: 2022-01-06 | Discharge: 2022-01-06 | Disposition: A | Discharge status: Home / Self Care | Payer: Medicare Other | Source: Ambulatory Visit | Attending: Internal Medicine | Admitting: Internal Medicine

## 2022-01-06 ENCOUNTER — Ambulatory Visit (HOSPITAL_COMMUNITY): Payer: Medicare Other

## 2022-01-06 DIAGNOSIS — M4620 Osteomyelitis of vertebra, site unspecified: Secondary | ICD-10-CM | POA: Insufficient documentation

## 2022-01-06 DIAGNOSIS — T847XXD Infection and inflammatory reaction due to other internal orthopedic prosthetic devices, implants and grafts, subsequent encounter: Secondary | ICD-10-CM | POA: Diagnosis present

## 2022-01-06 DIAGNOSIS — Z789 Other specified health status: Secondary | ICD-10-CM | POA: Diagnosis present

## 2022-01-06 MED ORDER — IOHEXOL 300 MG/ML  SOLN
100.0000 mL | Freq: Once | INTRAMUSCULAR | Status: AC | PRN
  Administered 2022-01-06: 100 mL via INTRAVENOUS

## 2022-01-08 ENCOUNTER — Other Ambulatory Visit: Payer: Self-pay | Admitting: Infectious Disease

## 2022-01-08 ENCOUNTER — Other Ambulatory Visit (HOSPITAL_COMMUNITY): Payer: Self-pay

## 2022-01-08 DIAGNOSIS — B20 Human immunodeficiency virus [HIV] disease: Secondary | ICD-10-CM

## 2022-01-11 ENCOUNTER — Other Ambulatory Visit (HOSPITAL_COMMUNITY): Payer: Self-pay

## 2022-01-12 ENCOUNTER — Other Ambulatory Visit (HOSPITAL_COMMUNITY): Payer: Self-pay

## 2022-01-12 MED ORDER — BIKTARVY 50-200-25 MG PO TABS
1.0000 | ORAL_TABLET | Freq: Every day | ORAL | 0 refills | Status: DC
  Filled 2022-01-12: qty 30, 30d supply, fill #0

## 2022-01-18 ENCOUNTER — Other Ambulatory Visit (HOSPITAL_COMMUNITY): Payer: Self-pay

## 2022-01-19 ENCOUNTER — Other Ambulatory Visit (HOSPITAL_COMMUNITY): Payer: Self-pay

## 2022-01-19 MED ORDER — ONDANSETRON 4 MG PO TBDP
ORAL_TABLET | ORAL | 0 refills | Status: DC
  Filled 2022-01-19: qty 20, 6d supply, fill #0

## 2022-01-19 MED ORDER — OXYCODONE-ACETAMINOPHEN 5-325 MG PO TABS
ORAL_TABLET | ORAL | 0 refills | Status: DC
  Filled 2022-01-19: qty 60, 30d supply, fill #0

## 2022-01-20 ENCOUNTER — Encounter: Payer: Self-pay | Admitting: Internal Medicine

## 2022-01-20 ENCOUNTER — Other Ambulatory Visit (HOSPITAL_COMMUNITY): Payer: Self-pay

## 2022-01-20 ENCOUNTER — Other Ambulatory Visit: Payer: Medicare Other

## 2022-01-20 ENCOUNTER — Other Ambulatory Visit: Payer: Self-pay

## 2022-01-20 ENCOUNTER — Ambulatory Visit (INDEPENDENT_AMBULATORY_CARE_PROVIDER_SITE_OTHER): Payer: Medicare Other | Admitting: Internal Medicine

## 2022-01-20 VITALS — BP 102/74 | HR 90 | Temp 97.9°F | Resp 16

## 2022-01-20 DIAGNOSIS — B20 Human immunodeficiency virus [HIV] disease: Secondary | ICD-10-CM

## 2022-01-20 DIAGNOSIS — T847XXD Infection and inflammatory reaction due to other internal orthopedic prosthetic devices, implants and grafts, subsequent encounter: Secondary | ICD-10-CM | POA: Diagnosis not present

## 2022-01-20 DIAGNOSIS — M869 Osteomyelitis, unspecified: Secondary | ICD-10-CM

## 2022-01-20 NOTE — Patient Instructions (Signed)
We are rechecking your hiv again. I am unclear why the viral load is high. We are also checking for genetic testing as well. If it still high and there is evidence of resistance, will need to change your hiv medication ? ? ?Continue doxycycline and cefdinir at this time ? ? ?Please make sure you get the weekly picture of the wound in the back and send to my chart ? ? ?Will discuss this again in around 10 days (your hiv testing) ? ? ? ?
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
## 2022-01-21 LAB — T-HELPER CELLS (CD4) COUNT (NOT AT ARMC)
CD4 % Helper T Cell: 39 % (ref 33–65)
CD4 T Cell Abs: 898 /uL (ref 400–1790)

## 2022-01-23 LAB — CBC WITH DIFFERENTIAL/PLATELET
Absolute Monocytes: 509 cells/uL (ref 200–950)
Basophils Absolute: 20 cells/uL (ref 0–200)
Basophils Relative: 0.3 %
Eosinophils Absolute: 221 cells/uL (ref 15–500)
Eosinophils Relative: 3.3 %
HCT: 40.6 % (ref 35.0–45.0)
Hemoglobin: 12.7 g/dL (ref 11.7–15.5)
Lymphs Abs: 2459 cells/uL (ref 850–3900)
MCH: 26 pg — ABNORMAL LOW (ref 27.0–33.0)
MCHC: 31.3 g/dL — ABNORMAL LOW (ref 32.0–36.0)
MCV: 83 fL (ref 80.0–100.0)
MPV: 11.9 fL (ref 7.5–12.5)
Monocytes Relative: 7.6 %
Neutro Abs: 3491 cells/uL (ref 1500–7800)
Neutrophils Relative %: 52.1 %
Platelets: 233 10*3/uL (ref 140–400)
RBC: 4.89 10*6/uL (ref 3.80–5.10)
RDW: 15.8 % — ABNORMAL HIGH (ref 11.0–15.0)
Total Lymphocyte: 36.7 %
WBC: 6.7 10*3/uL (ref 3.8–10.8)

## 2022-01-23 LAB — COMPLETE METABOLIC PANEL WITH GFR
AG Ratio: 1.2 (calc) (ref 1.0–2.5)
ALT: 26 U/L (ref 6–29)
AST: 18 U/L (ref 10–35)
Albumin: 3.7 g/dL (ref 3.6–5.1)
Alkaline phosphatase (APISO): 97 U/L (ref 37–153)
BUN/Creatinine Ratio: 16 (calc) (ref 6–22)
BUN: 7 mg/dL (ref 7–25)
CO2: 25 mmol/L (ref 20–32)
Calcium: 10 mg/dL (ref 8.6–10.4)
Chloride: 103 mmol/L (ref 98–110)
Creat: 0.44 mg/dL — ABNORMAL LOW (ref 0.50–1.03)
Globulin: 3 g/dL (calc) (ref 1.9–3.7)
Glucose, Bld: 73 mg/dL (ref 65–99)
Potassium: 4 mmol/L (ref 3.5–5.3)
Sodium: 138 mmol/L (ref 135–146)
Total Bilirubin: 0.4 mg/dL (ref 0.2–1.2)
Total Protein: 6.7 g/dL (ref 6.1–8.1)
eGFR: 115 mL/min/{1.73_m2} (ref >=60)

## 2022-01-23 LAB — HIV-1 RNA ULTRAQUANT REFLEX TO GENTYP+
HIV 1 RNA Quant: NOT DETECTED copies/mL
HIV-1 RNA Quant, Log: NOT DETECTED Log copies/mL

## 2022-02-01 ENCOUNTER — Other Ambulatory Visit (HOSPITAL_COMMUNITY): Payer: Self-pay

## 2022-02-01 ENCOUNTER — Other Ambulatory Visit: Payer: Self-pay | Admitting: Infectious Disease

## 2022-02-01 DIAGNOSIS — B20 Human immunodeficiency virus [HIV] disease: Secondary | ICD-10-CM

## 2022-02-01 NOTE — Telephone Encounter (Signed)
Appt 4/18

## 2022-02-02 ENCOUNTER — Other Ambulatory Visit: Payer: Self-pay

## 2022-02-02 ENCOUNTER — Other Ambulatory Visit (HOSPITAL_COMMUNITY): Payer: Self-pay

## 2022-02-02 ENCOUNTER — Ambulatory Visit (INDEPENDENT_AMBULATORY_CARE_PROVIDER_SITE_OTHER): Payer: Medicare Other | Admitting: Internal Medicine

## 2022-02-02 DIAGNOSIS — M4624 Osteomyelitis of vertebra, thoracic region: Secondary | ICD-10-CM

## 2022-02-02 DIAGNOSIS — T847XXD Infection and inflammatory reaction due to other internal orthopedic prosthetic devices, implants and grafts, subsequent encounter: Secondary | ICD-10-CM

## 2022-02-02 DIAGNOSIS — B20 Human immunodeficiency virus [HIV] disease: Secondary | ICD-10-CM | POA: Diagnosis not present

## 2022-02-02 MED ORDER — BIKTARVY 50-200-25 MG PO TABS
1.0000 | ORAL_TABLET | Freq: Every day | ORAL | 3 refills | Status: DC
  Filled 2022-02-02: qty 30, 30d supply, fill #0
  Filled 2022-02-25: qty 30, 30d supply, fill #1
  Filled 2022-03-23: qty 30, 30d supply, fill #2
  Filled 2022-04-15: qty 30, 30d supply, fill #3

## 2022-02-02 MED ORDER — METFORMIN HCL 500 MG PO TABS
500.0000 mg | ORAL_TABLET | Freq: Two times a day (BID) | ORAL | 3 refills | Status: DC
  Filled 2022-02-02: qty 180, 90d supply, fill #0
  Filled 2022-05-21: qty 180, 90d supply, fill #1
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
## 2022-02-02 NOTE — Patient Instructions (Signed)
Follow up 6 weeks ? ?No change in plan ?

## 2022-02-03 ENCOUNTER — Other Ambulatory Visit (HOSPITAL_COMMUNITY): Payer: Self-pay

## 2022-02-03 MED ORDER — BACLOFEN 10 MG PO TABS
10.0000 mg | ORAL_TABLET | Freq: Three times a day (TID) | ORAL | 1 refills | Status: DC
  Filled 2022-02-03: qty 270, 90d supply, fill #0
  Filled 2022-04-09: qty 270, 90d supply, fill #1

## 2022-02-03 MED ORDER — OXYBUTYNIN CHLORIDE 5 MG PO TABS
ORAL_TABLET | ORAL | 1 refills | Status: DC
  Filled 2022-02-03 (×2): qty 270, 90d supply, fill #0
  Filled 2022-05-12: qty 270, 90d supply, fill #1

## 2022-02-04 ENCOUNTER — Other Ambulatory Visit (HOSPITAL_COMMUNITY): Payer: Self-pay

## 2022-02-15 ENCOUNTER — Other Ambulatory Visit (HOSPITAL_COMMUNITY): Payer: Self-pay

## 2022-02-17 ENCOUNTER — Other Ambulatory Visit (HOSPITAL_COMMUNITY): Payer: Self-pay

## 2022-02-18 ENCOUNTER — Other Ambulatory Visit (HOSPITAL_COMMUNITY): Payer: Self-pay

## 2022-02-18 MED ORDER — ONETOUCH DELICA PLUS LANCET33G MISC
Freq: Two times a day (BID) | 11 refills | Status: DC
Start: 2022-02-18 — End: 2024-08-24
  Filled 2022-02-18: qty 100, 50d supply, fill #0
  Filled 2023-01-24: qty 100, 50d supply, fill #1

## 2022-02-18 MED ORDER — GLUCOSE BLOOD VI STRP
ORAL_STRIP | 3 refills | Status: AC
  Filled 2022-02-18: qty 100, 50d supply, fill #0

## 2022-02-25 ENCOUNTER — Other Ambulatory Visit (HOSPITAL_COMMUNITY): Payer: Self-pay

## 2022-03-03 ENCOUNTER — Other Ambulatory Visit (HOSPITAL_COMMUNITY): Payer: Self-pay

## 2022-03-09 ENCOUNTER — Other Ambulatory Visit (HOSPITAL_COMMUNITY): Payer: Self-pay

## 2022-03-12 ENCOUNTER — Other Ambulatory Visit (HOSPITAL_COMMUNITY): Payer: Self-pay

## 2022-03-18 ENCOUNTER — Other Ambulatory Visit (HOSPITAL_COMMUNITY): Payer: Self-pay

## 2022-03-18 MED ORDER — OXYCODONE-ACETAMINOPHEN 5-325 MG PO TABS
ORAL_TABLET | ORAL | 0 refills | Status: DC
  Filled 2022-03-18: qty 60, 30d supply, fill #0

## 2022-03-18 MED ORDER — GABAPENTIN 400 MG PO CAPS
ORAL_CAPSULE | ORAL | 2 refills | Status: DC
  Filled 2022-03-18: qty 90, 30d supply, fill #0
  Filled 2022-04-22: qty 90, 30d supply, fill #1
  Filled 2022-07-19: qty 90, 30d supply, fill #2

## 2022-03-23 ENCOUNTER — Other Ambulatory Visit (HOSPITAL_COMMUNITY): Payer: Self-pay

## 2022-03-24 ENCOUNTER — Other Ambulatory Visit (HOSPITAL_COMMUNITY): Payer: Self-pay

## 2022-03-26 ENCOUNTER — Other Ambulatory Visit (HOSPITAL_COMMUNITY): Payer: Self-pay

## 2022-04-09 ENCOUNTER — Other Ambulatory Visit (HOSPITAL_COMMUNITY): Payer: Self-pay

## 2022-04-10 ENCOUNTER — Other Ambulatory Visit (HOSPITAL_COMMUNITY): Payer: Self-pay

## 2022-04-10 MED ORDER — LANCETS MISC
Freq: Two times a day (BID) | 3 refills | Status: DC
  Filled 2022-04-10: qty 100, 50d supply, fill #0
  Filled 2022-05-21: qty 100, 50d supply, fill #1
  Filled 2023-01-21 (×2): qty 100, 50d supply, fill #2

## 2022-04-10 MED ORDER — GLUCOSE BLOOD VI STRP
ORAL_STRIP | 3 refills | Status: AC
  Filled 2022-04-10: qty 100, 50d supply, fill #0
  Filled 2022-05-21: qty 100, 50d supply, fill #1
  Filled 2023-01-21: qty 100, 50d supply, fill #2

## 2022-04-12 ENCOUNTER — Other Ambulatory Visit (HOSPITAL_COMMUNITY): Payer: Self-pay

## 2022-04-14 ENCOUNTER — Other Ambulatory Visit (HOSPITAL_COMMUNITY): Payer: Self-pay

## 2022-04-15 ENCOUNTER — Other Ambulatory Visit (HOSPITAL_COMMUNITY): Payer: Self-pay

## 2022-04-19 ENCOUNTER — Other Ambulatory Visit (HOSPITAL_COMMUNITY): Payer: Self-pay

## 2022-04-21 ENCOUNTER — Other Ambulatory Visit: Payer: Self-pay | Admitting: Nurse Practitioner

## 2022-04-21 ENCOUNTER — Other Ambulatory Visit (HOSPITAL_COMMUNITY): Payer: Self-pay

## 2022-04-21 DIAGNOSIS — Z1231 Encounter for screening mammogram for malignant neoplasm of breast: Secondary | ICD-10-CM

## 2022-04-21 MED ORDER — OXYCODONE-ACETAMINOPHEN 5-325 MG PO TABS
ORAL_TABLET | ORAL | 0 refills | Status: DC
  Filled 2022-04-21: qty 60, 30d supply, fill #0

## 2022-04-22 ENCOUNTER — Other Ambulatory Visit (HOSPITAL_COMMUNITY): Payer: Self-pay

## 2022-04-27 ENCOUNTER — Ambulatory Visit: Payer: Medicare Other | Admitting: Internal Medicine

## 2022-05-12 ENCOUNTER — Other Ambulatory Visit (HOSPITAL_COMMUNITY): Payer: Self-pay

## 2022-05-13 ENCOUNTER — Other Ambulatory Visit (HOSPITAL_COMMUNITY): Payer: Self-pay

## 2022-05-17 ENCOUNTER — Other Ambulatory Visit: Payer: Self-pay | Admitting: Infectious Disease

## 2022-05-17 ENCOUNTER — Other Ambulatory Visit (HOSPITAL_COMMUNITY): Payer: Self-pay

## 2022-05-17 ENCOUNTER — Encounter: Payer: Self-pay | Admitting: Infectious Disease

## 2022-05-17 ENCOUNTER — Other Ambulatory Visit: Payer: Self-pay

## 2022-05-17 ENCOUNTER — Ambulatory Visit (INDEPENDENT_AMBULATORY_CARE_PROVIDER_SITE_OTHER): Payer: Medicare Other | Admitting: Infectious Disease

## 2022-05-17 VITALS — BP 102/71 | HR 110 | Temp 97.6°F

## 2022-05-17 DIAGNOSIS — M4624 Osteomyelitis of vertebra, thoracic region: Secondary | ICD-10-CM

## 2022-05-17 DIAGNOSIS — M4620 Osteomyelitis of vertebra, site unspecified: Secondary | ICD-10-CM

## 2022-05-17 DIAGNOSIS — B20 Human immunodeficiency virus [HIV] disease: Secondary | ICD-10-CM | POA: Diagnosis not present

## 2022-05-17 MED ORDER — BIKTARVY 50-200-25 MG PO TABS
1.0000 | ORAL_TABLET | Freq: Every day | ORAL | 11 refills | Status: DC
  Filled 2022-05-17: qty 30, 30d supply, fill #0
  Filled 2022-06-15: qty 30, 30d supply, fill #1
  Filled 2022-07-19: qty 30, 30d supply, fill #2
  Filled 2022-08-16: qty 30, 30d supply, fill #3
  Filled 2022-09-10: qty 30, 30d supply, fill #4

## 2022-05-17 MED ORDER — DOXYCYCLINE HYCLATE 100 MG PO TABS
100.0000 mg | ORAL_TABLET | Freq: Two times a day (BID) | ORAL | 11 refills | Status: DC
Start: 2022-05-17 — End: 2022-09-22
  Filled 2022-05-17 – 2022-06-28 (×2): qty 60, 30d supply, fill #0
  Filled 2022-08-02: qty 180, 90d supply, fill #1

## 2022-05-17 MED ORDER — ATORVASTATIN CALCIUM 20 MG PO TABS
20.0000 mg | ORAL_TABLET | Freq: Every day | ORAL | 11 refills | Status: DC
  Filled 2022-05-17: qty 30, 30d supply, fill #0
  Filled 2022-06-18: qty 30, 30d supply, fill #1
  Filled 2022-07-21: qty 30, 30d supply, fill #2
  Filled 2022-08-17: qty 30, 30d supply, fill #3
  Filled 2022-09-20: qty 30, 30d supply, fill #4

## 2022-05-17 MED ORDER — CEFDINIR 300 MG PO CAPS
300.0000 mg | ORAL_CAPSULE | Freq: Two times a day (BID) | ORAL | 11 refills | Status: DC
  Filled 2022-05-17 – 2022-06-28 (×2): qty 60, 30d supply, fill #0
  Filled 2022-08-02: qty 180, 90d supply, fill #1
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
## 2022-05-17 NOTE — Telephone Encounter (Signed)
Pending 05/17/22 appointment

## 2022-05-18 ENCOUNTER — Other Ambulatory Visit (HOSPITAL_COMMUNITY): Payer: Self-pay

## 2022-05-18 ENCOUNTER — Ambulatory Visit: Payer: Medicare Other

## 2022-05-18 LAB — T-HELPER CELLS (CD4) COUNT (NOT AT ARMC)
CD4 % Helper T Cell: 40 % (ref 33–65)
CD4 T Cell Abs: 696 /uL (ref 400–1790)

## 2022-05-18 MED ORDER — OZEMPIC (2 MG/DOSE) 8 MG/3ML ~~LOC~~ SOPN
PEN_INJECTOR | SUBCUTANEOUS | 5 refills | Status: DC
  Filled 2022-05-18: qty 3, 28d supply, fill #0
  Filled 2022-06-15: qty 3, 28d supply, fill #1
  Filled 2022-07-19: qty 3, 28d supply, fill #2
  Filled 2022-08-16: qty 3, 28d supply, fill #3
  Filled 2022-09-10: qty 3, 28d supply, fill #4
  Filled 2022-10-12 (×2): qty 3, 28d supply, fill #5

## 2022-05-20 LAB — COMPLETE METABOLIC PANEL WITH GFR
AG Ratio: 1.3 (calc) (ref 1.0–2.5)
ALT: 44 U/L — ABNORMAL HIGH (ref 6–29)
AST: 29 U/L (ref 10–35)
Albumin: 3.5 g/dL — ABNORMAL LOW (ref 3.6–5.1)
Alkaline phosphatase (APISO): 122 U/L (ref 37–153)
BUN/Creatinine Ratio: 24 (calc) — ABNORMAL HIGH (ref 6–22)
BUN: 10 mg/dL (ref 7–25)
CO2: 24 mmol/L (ref 20–32)
Calcium: 9.5 mg/dL (ref 8.6–10.4)
Chloride: 105 mmol/L (ref 98–110)
Creat: 0.42 mg/dL — ABNORMAL LOW (ref 0.50–1.03)
Globulin: 2.8 g/dL (calc) (ref 1.9–3.7)
Glucose, Bld: 78 mg/dL (ref 65–99)
Potassium: 3.5 mmol/L (ref 3.5–5.3)
Sodium: 138 mmol/L (ref 135–146)
Total Bilirubin: 0.4 mg/dL (ref 0.2–1.2)
Total Protein: 6.3 g/dL (ref 6.1–8.1)
eGFR: 116 mL/min/{1.73_m2} (ref >=60)

## 2022-05-20 LAB — CBC WITH DIFFERENTIAL/PLATELET
Absolute Monocytes: 220 cells/uL (ref 200–950)
Basophils Absolute: 22 cells/uL (ref 0–200)
Basophils Relative: 0.5 %
Eosinophils Absolute: 70 cells/uL (ref 15–500)
Eosinophils Relative: 1.6 %
HCT: 39.3 % (ref 35.0–45.0)
Hemoglobin: 12.7 g/dL (ref 11.7–15.5)
Lymphs Abs: 1813 cells/uL (ref 850–3900)
MCH: 27.1 pg (ref 27.0–33.0)
MCHC: 32.3 g/dL (ref 32.0–36.0)
MCV: 84 fL (ref 80.0–100.0)
MPV: 12 fL (ref 7.5–12.5)
Monocytes Relative: 5 %
Neutro Abs: 2275 cells/uL (ref 1500–7800)
Neutrophils Relative %: 51.7 %
Platelets: 167 10*3/uL (ref 140–400)
RBC: 4.68 10*6/uL (ref 3.80–5.10)
RDW: 13.6 % (ref 11.0–15.0)
Total Lymphocyte: 41.2 %
WBC: 4.4 10*3/uL (ref 3.8–10.8)

## 2022-05-20 LAB — RPR: RPR Ser Ql: NONREACTIVE

## 2022-05-20 LAB — LIPID PANEL
Cholesterol: 155 mg/dL (ref <200)
HDL: 49 mg/dL — ABNORMAL LOW (ref >=50)
LDL Cholesterol (Calc): 86 mg/dL (calc)
Non-HDL Cholesterol (Calc): 106 mg/dL (calc) (ref <130)
Total CHOL/HDL Ratio: 3.2 (calc) (ref <5.0)
Triglycerides: 108 mg/dL (ref <150)

## 2022-05-20 LAB — HIV-1 RNA QUANT-NO REFLEX-BLD
HIV 1 RNA Quant: NOT DETECTED Copies/mL
HIV-1 RNA Quant, Log: NOT DETECTED Log cps/mL

## 2022-05-21 ENCOUNTER — Ambulatory Visit: Payer: Medicare Other

## 2022-05-21 ENCOUNTER — Other Ambulatory Visit (HOSPITAL_COMMUNITY): Payer: Self-pay

## 2022-06-10 ENCOUNTER — Other Ambulatory Visit (HOSPITAL_COMMUNITY): Payer: Self-pay

## 2022-06-15 ENCOUNTER — Other Ambulatory Visit (HOSPITAL_COMMUNITY): Payer: Self-pay

## 2022-06-16 ENCOUNTER — Other Ambulatory Visit (HOSPITAL_COMMUNITY): Payer: Self-pay

## 2022-06-18 ENCOUNTER — Other Ambulatory Visit (HOSPITAL_COMMUNITY): Payer: Self-pay

## 2022-06-28 ENCOUNTER — Other Ambulatory Visit (HOSPITAL_COMMUNITY): Payer: Self-pay

## 2022-06-29 ENCOUNTER — Other Ambulatory Visit (HOSPITAL_COMMUNITY): Payer: Self-pay

## 2022-07-02 ENCOUNTER — Other Ambulatory Visit (HOSPITAL_COMMUNITY): Payer: Self-pay

## 2022-07-02 MED ORDER — OXYCODONE-ACETAMINOPHEN 5-325 MG PO TABS
1.0000 | ORAL_TABLET | Freq: Two times a day (BID) | ORAL | 0 refills | Status: DC | PRN
  Filled 2022-07-02: qty 60, 30d supply, fill #0

## 2022-07-03 ENCOUNTER — Other Ambulatory Visit (HOSPITAL_COMMUNITY): Payer: Self-pay

## 2022-07-07 ENCOUNTER — Other Ambulatory Visit (HOSPITAL_COMMUNITY): Payer: Self-pay

## 2022-07-09 ENCOUNTER — Other Ambulatory Visit (HOSPITAL_COMMUNITY): Payer: Self-pay

## 2022-07-12 ENCOUNTER — Other Ambulatory Visit (HOSPITAL_COMMUNITY): Payer: Self-pay

## 2022-07-12 ENCOUNTER — Ambulatory Visit: Payer: Medicare Other | Admitting: Obstetrics & Gynecology

## 2022-07-16 ENCOUNTER — Ambulatory Visit
Admission: RE | Admit: 2022-07-16 | Discharge: 2022-07-16 | Disposition: A | Discharge status: Home / Self Care | Payer: Medicare Other | Source: Ambulatory Visit | Attending: Nurse Practitioner | Admitting: Nurse Practitioner

## 2022-07-16 ENCOUNTER — Other Ambulatory Visit: Payer: Self-pay | Admitting: Nurse Practitioner

## 2022-07-16 ENCOUNTER — Ambulatory Visit: Payer: Medicare Other

## 2022-07-16 DIAGNOSIS — Z1231 Encounter for screening mammogram for malignant neoplasm of breast: Secondary | ICD-10-CM

## 2022-07-16 DIAGNOSIS — N6310 Unspecified lump in the right breast, unspecified quadrant: Secondary | ICD-10-CM

## 2022-07-19 ENCOUNTER — Other Ambulatory Visit (HOSPITAL_COMMUNITY): Payer: Self-pay

## 2022-07-20 ENCOUNTER — Other Ambulatory Visit (HOSPITAL_COMMUNITY): Payer: Self-pay

## 2022-07-20 MED ORDER — BACLOFEN 10 MG PO TABS
10.0000 mg | ORAL_TABLET | Freq: Three times a day (TID) | ORAL | 1 refills | Status: DC
  Filled 2022-07-20: qty 270, 90d supply, fill #0
  Filled 2022-10-04: qty 270, 90d supply, fill #1

## 2022-07-21 ENCOUNTER — Other Ambulatory Visit (HOSPITAL_COMMUNITY): Payer: Self-pay

## 2022-07-28 ENCOUNTER — Other Ambulatory Visit: Payer: Medicare Other

## 2022-07-29 ENCOUNTER — Other Ambulatory Visit (HOSPITAL_COMMUNITY): Payer: Self-pay

## 2022-08-02 ENCOUNTER — Other Ambulatory Visit (HOSPITAL_COMMUNITY): Payer: Self-pay

## 2022-08-03 ENCOUNTER — Other Ambulatory Visit (HOSPITAL_COMMUNITY): Payer: Self-pay

## 2022-08-04 ENCOUNTER — Other Ambulatory Visit (HOSPITAL_COMMUNITY): Payer: Self-pay

## 2022-08-05 ENCOUNTER — Other Ambulatory Visit (HOSPITAL_COMMUNITY): Payer: Self-pay

## 2022-08-10 ENCOUNTER — Ambulatory Visit
Admission: RE | Admit: 2022-08-10 | Discharge: 2022-08-10 | Disposition: A | Discharge status: Home / Self Care | Payer: Medicare Other | Source: Ambulatory Visit | Attending: Nurse Practitioner | Admitting: Nurse Practitioner

## 2022-08-10 ENCOUNTER — Other Ambulatory Visit (HOSPITAL_COMMUNITY): Payer: Self-pay

## 2022-08-10 DIAGNOSIS — N6310 Unspecified lump in the right breast, unspecified quadrant: Secondary | ICD-10-CM

## 2022-08-13 ENCOUNTER — Other Ambulatory Visit (HOSPITAL_COMMUNITY): Payer: Self-pay

## 2022-08-13 MED ORDER — ONDANSETRON 4 MG PO TBDP
4.0000 mg | ORAL_TABLET | Freq: Three times a day (TID) | ORAL | 0 refills | Status: DC | PRN
  Filled 2022-08-13: qty 20, 7d supply, fill #0

## 2022-08-16 ENCOUNTER — Other Ambulatory Visit (HOSPITAL_COMMUNITY): Payer: Self-pay

## 2022-08-17 ENCOUNTER — Other Ambulatory Visit (HOSPITAL_COMMUNITY): Payer: Self-pay

## 2022-08-17 MED ORDER — OXYBUTYNIN CHLORIDE 5 MG PO TABS
5.0000 mg | ORAL_TABLET | Freq: Three times a day (TID) | ORAL | 1 refills | Status: DC
  Filled 2022-08-17 – 2022-08-21 (×2): qty 270, 90d supply, fill #0
  Filled 2022-11-22: qty 270, 90d supply, fill #1

## 2022-08-19 ENCOUNTER — Other Ambulatory Visit (HOSPITAL_COMMUNITY): Payer: Self-pay

## 2022-08-21 ENCOUNTER — Other Ambulatory Visit (HOSPITAL_COMMUNITY): Payer: Self-pay

## 2022-08-23 ENCOUNTER — Other Ambulatory Visit (HOSPITAL_COMMUNITY): Payer: Self-pay

## 2022-08-27 ENCOUNTER — Other Ambulatory Visit (HOSPITAL_COMMUNITY): Payer: Self-pay

## 2022-08-27 ENCOUNTER — Encounter
Payer: Medicare Other | Attending: Physical Medicine and Rehabilitation | Admitting: Physical Medicine and Rehabilitation

## 2022-08-27 ENCOUNTER — Encounter: Payer: Self-pay | Admitting: Physical Medicine and Rehabilitation

## 2022-08-27 VITALS — BP 103/72 | HR 119

## 2022-08-27 DIAGNOSIS — K592 Neurogenic bowel, not elsewhere classified: Secondary | ICD-10-CM | POA: Insufficient documentation

## 2022-08-27 DIAGNOSIS — L89152 Pressure ulcer of sacral region, stage 2: Secondary | ICD-10-CM | POA: Diagnosis present

## 2022-08-27 DIAGNOSIS — G894 Chronic pain syndrome: Secondary | ICD-10-CM | POA: Diagnosis present

## 2022-08-27 DIAGNOSIS — Z5181 Encounter for therapeutic drug level monitoring: Secondary | ICD-10-CM | POA: Insufficient documentation

## 2022-08-27 DIAGNOSIS — G8220 Paraplegia, unspecified: Secondary | ICD-10-CM | POA: Insufficient documentation

## 2022-08-27 DIAGNOSIS — Z79891 Long term (current) use of opiate analgesic: Secondary | ICD-10-CM | POA: Diagnosis present

## 2022-08-27 DIAGNOSIS — G8921 Chronic pain due to trauma: Secondary | ICD-10-CM | POA: Diagnosis present

## 2022-08-27 MED ORDER — "OPTIFOAM GENTLE AG DRESSING 4""X4"" EX PADS"
1.0000 | MEDICATED_PAD | Freq: Every day | CUTANEOUS | 1 refills | Status: AC
  Filled 2022-08-27: qty 30, fill #0

## 2022-08-27 NOTE — Addendum Note (Signed)
Addended by: Jasmine December T on: 08/27/2022 03:39 PM   Modules accepted: Orders
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
## 2022-08-27 NOTE — Patient Instructions (Signed)
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    Send me a picture of backside- by mychart- and I can order something. Sounds like stage II vs shear injury  2. Very allergic to Sulfa-gets hives-  so cannot do Silvadene cream.   3. Optifoam Silver foam dressing- will order- pt has had in past and covered by insurance- will order daily due to bowel issues- since gets soiled with BM's.   4.  SCI support group- 6-7 pm- last Thursday of the month- at [facility] [facility]- 1st first floor conference room which is pst the Gym and deli area on Right.  See if Darnelle Maffucci can add to SCI support group email list.   5.  Can do opiate contract- and oral drug screen.   6. Call me when you need a refill- don't wait til the last second- to let me know and so I can send in meds.   7.   [employer] hasn't fixed w/c accessories- chest strap isn't fixed- Deberah Pelton promised you will get a call from [place]- .   8. [employer] only allows [employer] for w/c- suggest other insurance- medicare options, then can use [employer].  9. F/U in 4 months. Double appt-

## 2022-08-28 ENCOUNTER — Other Ambulatory Visit (HOSPITAL_COMMUNITY): Payer: Self-pay

## 2022-08-30 ENCOUNTER — Other Ambulatory Visit (HOSPITAL_COMMUNITY): Payer: Self-pay

## 2022-08-31 ENCOUNTER — Other Ambulatory Visit (HOSPITAL_COMMUNITY): Payer: Self-pay

## 2022-08-31 ENCOUNTER — Telehealth: Payer: Self-pay

## 2022-08-31 LAB — DRUG TOX ALC METAB W/CON, ORAL FLD: Alcohol Metabolite: NEGATIVE ng/mL (ref <25)

## 2022-08-31 LAB — DRUG TOX MONITOR 1 W/CONF, ORAL FLD

## 2022-08-31 NOTE — Telephone Encounter (Signed)
Patient called for a script  to be sent for tape. Patient advised to call back with more details.

## 2022-09-01 ENCOUNTER — Other Ambulatory Visit (HOSPITAL_COMMUNITY): Payer: Self-pay

## 2022-09-01 ENCOUNTER — Telehealth: Payer: Self-pay

## 2022-09-01 MED ORDER — OXYCODONE-ACETAMINOPHEN 10-325 MG PO TABS
1.0000 | ORAL_TABLET | Freq: Three times a day (TID) | ORAL | 0 refills | Status: DC | PRN
  Filled 2022-09-01 – 2022-09-17 (×2): qty 90, 30d supply, fill #0

## 2022-09-01 NOTE — Telephone Encounter (Signed)
Please send pain medication to [facility].   Patient has requested Percocet 10-325 MG.

## 2022-09-02 ENCOUNTER — Telehealth: Payer: Self-pay | Admitting: Physical Medicine and Rehabilitation

## 2022-09-02 NOTE — Telephone Encounter (Signed)
Attempted to upload picture of wound in my chart and was not able to do so per Dr. Florentina Jenny request.   Can someone call and assist patient.

## 2022-09-06 ENCOUNTER — Ambulatory Visit: Payer: Medicare Other | Admitting: Infectious Disease

## 2022-09-07 ENCOUNTER — Other Ambulatory Visit (HOSPITAL_COMMUNITY): Payer: Self-pay

## 2022-09-08 NOTE — Telephone Encounter (Signed)
Patient will see if a family member can assist. Or print it out in color and drop off the photo.

## 2022-09-10 ENCOUNTER — Other Ambulatory Visit (HOSPITAL_COMMUNITY): Payer: Self-pay

## 2022-09-13 ENCOUNTER — Other Ambulatory Visit (HOSPITAL_COMMUNITY): Payer: Self-pay

## 2022-09-15 ENCOUNTER — Other Ambulatory Visit (HOSPITAL_COMMUNITY)
Admission: RE | Admit: 2022-09-15 | Discharge: 2022-09-15 | Disposition: A | Discharge status: Home / Self Care | Payer: Medicare Other | Source: Ambulatory Visit | Attending: Obstetrics & Gynecology | Admitting: Obstetrics & Gynecology

## 2022-09-15 ENCOUNTER — Ambulatory Visit (INDEPENDENT_AMBULATORY_CARE_PROVIDER_SITE_OTHER): Payer: Medicare Other | Admitting: Obstetrics & Gynecology

## 2022-09-15 ENCOUNTER — Encounter: Payer: Self-pay | Admitting: Obstetrics & Gynecology

## 2022-09-15 VITALS — BP 102/66 | HR 93

## 2022-09-15 DIAGNOSIS — Z01419 Encounter for gynecological examination (general) (routine) without abnormal findings: Secondary | ICD-10-CM | POA: Insufficient documentation

## 2022-09-15 DIAGNOSIS — R87610 Atypical squamous cells of undetermined significance on cytologic smear of cervix (ASC-US): Secondary | ICD-10-CM | POA: Diagnosis not present

## 2022-09-15 DIAGNOSIS — Z1151 Encounter for screening for human papillomavirus (HPV): Secondary | ICD-10-CM | POA: Insufficient documentation
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
## 2022-09-15 NOTE — Progress Notes (Signed)
Patient assisted with in and out cath; urine output approx 80 mL.

## 2022-09-17 ENCOUNTER — Telehealth: Payer: Self-pay | Admitting: *Deleted

## 2022-09-17 ENCOUNTER — Other Ambulatory Visit (HOSPITAL_COMMUNITY): Payer: Self-pay

## 2022-09-17 LAB — CYTOLOGY - PAP
Comment: NEGATIVE
Diagnosis: NEGATIVE
Diagnosis: REACTIVE
High risk HPV: NEGATIVE

## 2022-09-17 NOTE — Telephone Encounter (Signed)
Mr Ellen Lane is calling for his oxycodone to be sent to [facility] pharmacy. In looking in the meds there was an rx sent by Dr Dagoberto Ligas on 09/01/22 and I confirmed the pharmacy has it. I let the patient know.

## 2022-09-20 ENCOUNTER — Other Ambulatory Visit (HOSPITAL_COMMUNITY): Payer: Self-pay
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
## 2022-09-22 ENCOUNTER — Encounter: Payer: Self-pay | Admitting: Infectious Disease

## 2022-09-22 ENCOUNTER — Other Ambulatory Visit (HOSPITAL_COMMUNITY): Payer: Self-pay

## 2022-09-22 ENCOUNTER — Other Ambulatory Visit: Payer: Self-pay

## 2022-09-22 ENCOUNTER — Ambulatory Visit (INDEPENDENT_AMBULATORY_CARE_PROVIDER_SITE_OTHER): Payer: Medicare Other

## 2022-09-22 ENCOUNTER — Ambulatory Visit (INDEPENDENT_AMBULATORY_CARE_PROVIDER_SITE_OTHER): Payer: Medicare Other | Admitting: Infectious Disease

## 2022-09-22 VITALS — BP 100/72 | HR 109 | Temp 97.5°F

## 2022-09-22 DIAGNOSIS — M4624 Osteomyelitis of vertebra, thoracic region: Secondary | ICD-10-CM

## 2022-09-22 DIAGNOSIS — M4620 Osteomyelitis of vertebra, site unspecified: Secondary | ICD-10-CM | POA: Diagnosis not present

## 2022-09-22 DIAGNOSIS — Z23 Encounter for immunization: Secondary | ICD-10-CM | POA: Diagnosis not present

## 2022-09-22 DIAGNOSIS — E119 Type 2 diabetes mellitus without complications: Secondary | ICD-10-CM

## 2022-09-22 DIAGNOSIS — B20 Human immunodeficiency virus [HIV] disease: Secondary | ICD-10-CM

## 2022-09-22 DIAGNOSIS — I10 Essential (primary) hypertension: Secondary | ICD-10-CM | POA: Diagnosis not present

## 2022-09-22 MED ORDER — ATORVASTATIN CALCIUM 20 MG PO TABS
20.0000 mg | ORAL_TABLET | Freq: Every day | ORAL | 11 refills | Status: DC
  Filled 2022-09-22 – 2022-10-22 (×2): qty 30, 30d supply, fill #0
  Filled 2022-11-25 (×2): qty 30, 30d supply, fill #1

## 2022-09-22 MED ORDER — DOXYCYCLINE HYCLATE 100 MG PO TABS
100.0000 mg | ORAL_TABLET | Freq: Two times a day (BID) | ORAL | 11 refills | Status: DC
  Filled 2022-09-22: qty 60, 30d supply, fill #0
  Filled 2022-11-01: qty 180, 90d supply, fill #0
  Filled 2023-01-21: qty 180, 90d supply, fill #1

## 2022-09-22 MED ORDER — BIKTARVY 50-200-25 MG PO TABS
1.0000 | ORAL_TABLET | Freq: Every day | ORAL | 11 refills | Status: DC
  Filled 2022-09-22 – 2022-10-08 (×2): qty 30, 30d supply, fill #0
  Filled 2022-11-01: qty 30, 30d supply, fill #1
  Filled 2022-12-06: qty 30, 30d supply, fill #2
  Filled 2022-12-30: qty 30, 30d supply, fill #3
  Filled 2023-01-21 – 2023-01-25 (×2): qty 30, 30d supply, fill #4

## 2022-09-22 MED ORDER — CEFDINIR 300 MG PO CAPS
300.0000 mg | ORAL_CAPSULE | Freq: Two times a day (BID) | ORAL | 11 refills | Status: DC
  Filled 2022-09-22: qty 60, 30d supply, fill #0
  Filled 2022-11-01: qty 180, 90d supply, fill #0
  Filled 2023-01-21: qty 180, 90d supply, fill #1

## 2022-09-23 LAB — T-HELPER CELLS (CD4) COUNT (NOT AT ARMC)
CD4 % Helper T Cell: 44 % (ref 33–65)
CD4 T Cell Abs: 759 /uL (ref 400–1790)

## 2022-09-25 LAB — LIPID PANEL
Cholesterol: 126 mg/dL (ref <200)
HDL: 55 mg/dL (ref >=50)
LDL Cholesterol (Calc): 56 mg/dL (calc)
Non-HDL Cholesterol (Calc): 71 mg/dL (calc) (ref <130)
Total CHOL/HDL Ratio: 2.3 (calc) (ref <5.0)
Triglycerides: 70 mg/dL (ref <150)

## 2022-09-25 LAB — CBC WITH DIFFERENTIAL/PLATELET
Absolute Monocytes: 198 cells/uL — ABNORMAL LOW (ref 200–950)
Basophils Absolute: 18 cells/uL (ref 0–200)
Basophils Relative: 0.5 %
Eosinophils Absolute: 40 cells/uL (ref 15–500)
Eosinophils Relative: 1.1 %
HCT: 38.7 % (ref 35.0–45.0)
Hemoglobin: 12.6 g/dL (ref 11.7–15.5)
Lymphs Abs: 1858 cells/uL (ref 850–3900)
MCH: 27.6 pg (ref 27.0–33.0)
MCHC: 32.6 g/dL (ref 32.0–36.0)
MCV: 84.9 fL (ref 80.0–100.0)
MPV: 12.5 fL (ref 7.5–12.5)
Monocytes Relative: 5.5 %
Neutro Abs: 1487 cells/uL — ABNORMAL LOW (ref 1500–7800)
Neutrophils Relative %: 41.3 %
Platelets: 182 10*3/uL (ref 140–400)
RBC: 4.56 10*6/uL (ref 3.80–5.10)
RDW: 15.4 % — ABNORMAL HIGH (ref 11.0–15.0)
Total Lymphocyte: 51.6 %
WBC: 3.6 10*3/uL — ABNORMAL LOW (ref 3.8–10.8)

## 2022-09-25 LAB — COMPLETE METABOLIC PANEL WITH GFR
AG Ratio: 1.4 (calc) (ref 1.0–2.5)
ALT: 31 U/L — ABNORMAL HIGH (ref 6–29)
AST: 22 U/L (ref 10–35)
Albumin: 3.6 g/dL (ref 3.6–5.1)
Alkaline phosphatase (APISO): 125 U/L (ref 37–153)
BUN/Creatinine Ratio: 22 (calc) (ref 6–22)
BUN: 10 mg/dL (ref 7–25)
CO2: 21 mmol/L (ref 20–32)
Calcium: 9.1 mg/dL (ref 8.6–10.4)
Chloride: 108 mmol/L (ref 98–110)
Creat: 0.46 mg/dL — ABNORMAL LOW (ref 0.50–1.03)
Globulin: 2.6 g/dL (calc) (ref 1.9–3.7)
Glucose, Bld: 71 mg/dL (ref 65–99)
Potassium: 3.5 mmol/L (ref 3.5–5.3)
Sodium: 140 mmol/L (ref 135–146)
Total Bilirubin: 0.6 mg/dL (ref 0.2–1.2)
Total Protein: 6.2 g/dL (ref 6.1–8.1)
eGFR: 114 mL/min/{1.73_m2} (ref >=60)

## 2022-09-25 LAB — C-REACTIVE PROTEIN: CRP: 0.2 mg/L (ref <8.0)

## 2022-09-25 LAB — RPR: RPR Ser Ql: NONREACTIVE

## 2022-09-25 LAB — LDL CHOLESTEROL, DIRECT: Direct LDL: 61 mg/dL (ref <100)

## 2022-09-25 LAB — SEDIMENTATION RATE: Sed Rate: 11 mm/h (ref 0–30)

## 2022-09-25 LAB — HIV-1 RNA QUANT-NO REFLEX-BLD
HIV 1 RNA Quant: NOT DETECTED Copies/mL
HIV-1 RNA Quant, Log: NOT DETECTED Log cps/mL

## 2022-10-01 ENCOUNTER — Other Ambulatory Visit: Payer: Self-pay

## 2022-10-04 ENCOUNTER — Other Ambulatory Visit (HOSPITAL_COMMUNITY): Payer: Self-pay

## 2022-10-04 ENCOUNTER — Other Ambulatory Visit: Payer: Self-pay

## 2022-10-04 MED ORDER — ONDANSETRON 4 MG PO TBDP
4.0000 mg | ORAL_TABLET | Freq: Three times a day (TID) | ORAL | 0 refills | Status: AC | PRN
  Filled 2022-10-04: qty 20, 7d supply, fill #0

## 2022-10-06 ENCOUNTER — Other Ambulatory Visit (HOSPITAL_COMMUNITY): Payer: Self-pay

## 2022-10-08 ENCOUNTER — Other Ambulatory Visit (HOSPITAL_COMMUNITY): Payer: Self-pay

## 2022-10-08 ENCOUNTER — Other Ambulatory Visit: Payer: Self-pay

## 2022-10-12 ENCOUNTER — Other Ambulatory Visit: Payer: Self-pay

## 2022-10-12 ENCOUNTER — Other Ambulatory Visit (HOSPITAL_COMMUNITY): Payer: Self-pay

## 2022-10-22 ENCOUNTER — Other Ambulatory Visit (HOSPITAL_COMMUNITY): Payer: Self-pay

## 2022-10-25 ENCOUNTER — Other Ambulatory Visit: Payer: Self-pay

## 2022-10-25 ENCOUNTER — Telehealth: Payer: Self-pay | Admitting: Physical Medicine and Rehabilitation

## 2022-10-25 MED ORDER — OXYCODONE-ACETAMINOPHEN 10-325 MG PO TABS
1.0000 | ORAL_TABLET | Freq: Three times a day (TID) | ORAL | 0 refills | Status: DC | PRN
  Filled 2022-10-25: qty 90, 30d supply, fill #0

## 2022-10-25 NOTE — Telephone Encounter (Signed)
The patient called and said she needed her percocet refilled

## 2022-10-26 ENCOUNTER — Other Ambulatory Visit: Payer: Self-pay

## 2022-10-26 ENCOUNTER — Other Ambulatory Visit (HOSPITAL_COMMUNITY): Payer: Self-pay

## 2022-10-27 ENCOUNTER — Other Ambulatory Visit (HOSPITAL_COMMUNITY): Payer: Self-pay

## 2022-10-27 ENCOUNTER — Telehealth: Payer: Self-pay | Admitting: Physical Medicine and Rehabilitation

## 2022-10-27 NOTE — Telephone Encounter (Signed)
Patient is trying to get her oxycodone prescription filled and [facility] doesn't have it.  They told her it is on backorder.  Please call patient.

## 2022-10-27 NOTE — Telephone Encounter (Signed)
LVM for patient to call back. ?

## 2022-10-27 NOTE — Telephone Encounter (Signed)
Pt called back and I advised her to find it in stock at a pharmacy and call back to let us know where to send it to.

## 2022-10-28 ENCOUNTER — Other Ambulatory Visit (HOSPITAL_COMMUNITY): Payer: Self-pay

## 2022-10-29 NOTE — Telephone Encounter (Signed)
Called patient to check on Rx, she advised she was able to pick up oxycodone yesterday.

## 2022-11-01 ENCOUNTER — Other Ambulatory Visit: Payer: Self-pay

## 2022-11-01 ENCOUNTER — Other Ambulatory Visit (HOSPITAL_COMMUNITY): Payer: Self-pay

## 2022-11-03 ENCOUNTER — Other Ambulatory Visit (HOSPITAL_COMMUNITY): Payer: Self-pay

## 2022-11-05 ENCOUNTER — Other Ambulatory Visit: Payer: Self-pay

## 2022-11-05 ENCOUNTER — Other Ambulatory Visit (HOSPITAL_COMMUNITY): Payer: Self-pay

## 2022-11-05 MED ORDER — OZEMPIC (2 MG/DOSE) 8 MG/3ML ~~LOC~~ SOPN
2.0000 mg | PEN_INJECTOR | SUBCUTANEOUS | 5 refills | Status: DC
  Filled 2022-11-05: qty 3, 28d supply, fill #0
  Filled 2022-12-06: qty 3, 28d supply, fill #1
  Filled 2023-01-03: qty 3, 28d supply, fill #2
  Filled 2023-01-21 – 2023-02-02 (×2): qty 3, 28d supply, fill #3
  Filled 2023-02-25: qty 3, 28d supply, fill #4
  Filled 2023-03-29: qty 3, 28d supply, fill #5

## 2022-11-08 ENCOUNTER — Other Ambulatory Visit (HOSPITAL_COMMUNITY): Payer: Self-pay

## 2022-11-09 ENCOUNTER — Other Ambulatory Visit (HOSPITAL_COMMUNITY): Payer: Self-pay

## 2022-11-10 ENCOUNTER — Other Ambulatory Visit: Payer: Self-pay

## 2022-11-10 ENCOUNTER — Other Ambulatory Visit (HOSPITAL_COMMUNITY): Payer: Self-pay

## 2022-11-10 MED ORDER — KETOROLAC TROMETHAMINE 0.4 % OP SOLN
1.0000 [drp] | Freq: Four times a day (QID) | OPHTHALMIC | 1 refills | Status: DC
  Filled 2022-11-10: qty 5, 25d supply, fill #0
  Filled 2022-11-22: qty 5, 25d supply, fill #1

## 2022-11-10 MED ORDER — OFLOXACIN 0.3 % OP SOLN
1.0000 [drp] | Freq: Four times a day (QID) | OPHTHALMIC | 1 refills | Status: DC
  Filled 2022-11-10: qty 5, 25d supply, fill #0
  Filled 2022-11-22: qty 5, 25d supply, fill #1

## 2022-11-10 MED ORDER — PREDNISOLONE ACETATE 1 % OP SUSP
1.0000 [drp] | Freq: Four times a day (QID) | OPHTHALMIC | 1 refills | Status: DC
  Filled 2022-11-10: qty 10, 50d supply, fill #0
  Filled 2022-11-22 – 2023-01-24 (×2): qty 10, 50d supply, fill #1

## 2022-11-11 ENCOUNTER — Other Ambulatory Visit (HOSPITAL_COMMUNITY): Payer: Self-pay

## 2022-11-17 ENCOUNTER — Other Ambulatory Visit (HOSPITAL_COMMUNITY): Payer: Self-pay

## 2022-11-19 ENCOUNTER — Other Ambulatory Visit (HOSPITAL_COMMUNITY): Payer: Self-pay

## 2022-11-22 ENCOUNTER — Other Ambulatory Visit (HOSPITAL_COMMUNITY): Payer: Self-pay

## 2022-11-22 ENCOUNTER — Other Ambulatory Visit: Payer: Self-pay | Admitting: Physical Medicine and Rehabilitation

## 2022-11-22 ENCOUNTER — Other Ambulatory Visit: Payer: Self-pay

## 2022-11-22 MED ORDER — OXYCODONE-ACETAMINOPHEN 10-325 MG PO TABS
1.0000 | ORAL_TABLET | Freq: Three times a day (TID) | ORAL | 0 refills | Status: DC | PRN
  Filled 2022-11-22 – 2022-11-25 (×2): qty 90, 30d supply, fill #0

## 2022-11-23 ENCOUNTER — Other Ambulatory Visit (HOSPITAL_COMMUNITY): Payer: Self-pay

## 2022-11-25 ENCOUNTER — Other Ambulatory Visit: Payer: Self-pay

## 2022-11-25 ENCOUNTER — Other Ambulatory Visit (HOSPITAL_COMMUNITY): Payer: Self-pay

## 2022-11-29 ENCOUNTER — Encounter: Payer: Self-pay | Admitting: Physical Medicine and Rehabilitation

## 2022-11-29 ENCOUNTER — Other Ambulatory Visit (HOSPITAL_COMMUNITY): Payer: Self-pay

## 2022-11-29 ENCOUNTER — Encounter: Payer: 59 | Attending: Physical Medicine and Rehabilitation | Admitting: Physical Medicine and Rehabilitation

## 2022-11-29 VITALS — BP 119/77 | HR 72

## 2022-11-29 DIAGNOSIS — K592 Neurogenic bowel, not elsewhere classified: Secondary | ICD-10-CM | POA: Insufficient documentation

## 2022-11-29 DIAGNOSIS — G43009 Migraine without aura, not intractable, without status migrainosus: Secondary | ICD-10-CM | POA: Diagnosis present

## 2022-11-29 DIAGNOSIS — G8220 Paraplegia, unspecified: Secondary | ICD-10-CM | POA: Insufficient documentation

## 2022-11-29 DIAGNOSIS — Z993 Dependence on wheelchair: Secondary | ICD-10-CM | POA: Diagnosis present

## 2022-11-29 DIAGNOSIS — M7918 Myalgia, other site: Secondary | ICD-10-CM | POA: Insufficient documentation

## 2022-11-29 DIAGNOSIS — G8921 Chronic pain due to trauma: Secondary | ICD-10-CM | POA: Diagnosis present

## 2022-11-29 MED ORDER — OXYCODONE-ACETAMINOPHEN 10-325 MG PO TABS
1.0000 | ORAL_TABLET | Freq: Three times a day (TID) | ORAL | 0 refills | Status: DC | PRN
  Filled 2022-11-29 – 2022-12-20 (×2): qty 90, 30d supply, fill #0

## 2022-11-29 NOTE — Patient Instructions (Signed)
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  F/u for paraplegia   Theracane- hold pressure 2-4 minutes- don't massage- youtube has some videos to learn how to use it. The bigger the muscle, the more time. Can do pecs, chest muscles, neck, like I owrked on; and upper and middle back- hook allows you to hold pressure eon back.  -something you can do to relax muscles.    2.  At next appointment- can try lidocaine trigger point injections if doesn't have good enough results with theracane.   3. Bring transfer board next visit, so can weigh you!   4. Have Dr Arnoldo Morale send me a progress note- of your next visit.   5. Refill Percocet- 10/325 mg 3x/day #90-  refilled for 12/19/22.   6. F/U in 3 months- double appointment- [facility]
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
## 2022-12-01 ENCOUNTER — Other Ambulatory Visit (HOSPITAL_COMMUNITY): Payer: Self-pay

## 2022-12-02 ENCOUNTER — Other Ambulatory Visit (HOSPITAL_COMMUNITY): Payer: Self-pay

## 2022-12-06 ENCOUNTER — Other Ambulatory Visit (HOSPITAL_COMMUNITY): Payer: Self-pay

## 2022-12-08 ENCOUNTER — Other Ambulatory Visit (HOSPITAL_COMMUNITY): Payer: Self-pay

## 2022-12-17 ENCOUNTER — Other Ambulatory Visit: Payer: Self-pay | Admitting: Infectious Disease

## 2022-12-17 ENCOUNTER — Other Ambulatory Visit (HOSPITAL_COMMUNITY): Payer: Self-pay

## 2022-12-20 ENCOUNTER — Other Ambulatory Visit (HOSPITAL_COMMUNITY): Payer: Self-pay

## 2022-12-20 ENCOUNTER — Other Ambulatory Visit: Payer: Self-pay

## 2022-12-20 MED ORDER — ATORVASTATIN CALCIUM 20 MG PO TABS
20.0000 mg | ORAL_TABLET | Freq: Every day | ORAL | 2 refills | Status: DC
  Filled 2022-12-20: qty 90, 90d supply, fill #0
  Filled 2023-01-24 – 2023-03-23 (×2): qty 90, 90d supply, fill #1
  Filled 2023-07-04 (×2): qty 90, 90d supply, fill #2

## 2022-12-21 ENCOUNTER — Other Ambulatory Visit (HOSPITAL_COMMUNITY): Payer: Self-pay

## 2022-12-27 ENCOUNTER — Other Ambulatory Visit (HOSPITAL_COMMUNITY): Payer: Self-pay

## 2022-12-27 ENCOUNTER — Other Ambulatory Visit: Payer: Self-pay

## 2022-12-27 MED ORDER — BLOOD GLUCOSE MONITOR KIT
PACK | Freq: Two times a day (BID) | 0 refills | Status: AC
  Filled 2022-12-27 (×2): qty 1, 30d supply, fill #0

## 2022-12-28 ENCOUNTER — Other Ambulatory Visit (HOSPITAL_COMMUNITY): Payer: Self-pay

## 2022-12-28 MED ORDER — KETOROLAC TROMETHAMINE 0.4 % OP SOLN
1.0000 [drp] | Freq: Four times a day (QID) | OPHTHALMIC | 1 refills | Status: DC
  Filled 2022-12-28: qty 5, 25d supply, fill #0

## 2022-12-29 ENCOUNTER — Other Ambulatory Visit (HOSPITAL_COMMUNITY): Payer: Self-pay

## 2022-12-30 ENCOUNTER — Other Ambulatory Visit (HOSPITAL_COMMUNITY): Payer: Self-pay

## 2022-12-31 ENCOUNTER — Other Ambulatory Visit (HOSPITAL_COMMUNITY): Payer: Self-pay

## 2022-12-31 MED ORDER — OXYBUTYNIN CHLORIDE 5 MG PO TABS
5.0000 mg | ORAL_TABLET | Freq: Three times a day (TID) | ORAL | 11 refills | Status: DC
  Filled 2022-12-31 – 2023-02-14 (×5): qty 90, 30d supply, fill #0
  Filled 2023-03-21: qty 90, 30d supply, fill #1
  Filled 2023-04-22: qty 90, 30d supply, fill #2
  Filled 2023-05-27: qty 90, 30d supply, fill #3
  Filled 2023-06-27: qty 90, 30d supply, fill #4
  Filled 2023-07-29: qty 90, 30d supply, fill #5
  Filled 2023-08-31: qty 90, 30d supply, fill #6
  Filled 2023-12-29: qty 90, 30d supply, fill #7

## 2023-01-03 ENCOUNTER — Other Ambulatory Visit: Payer: Self-pay

## 2023-01-03 ENCOUNTER — Other Ambulatory Visit (HOSPITAL_COMMUNITY): Payer: Self-pay

## 2023-01-04 ENCOUNTER — Other Ambulatory Visit (HOSPITAL_COMMUNITY): Payer: Self-pay

## 2023-01-04 ENCOUNTER — Other Ambulatory Visit: Payer: Self-pay

## 2023-01-04 MED ORDER — BACLOFEN 10 MG PO TABS
10.0000 mg | ORAL_TABLET | Freq: Three times a day (TID) | ORAL | 1 refills | Status: DC
  Filled 2023-01-04: qty 270, 90d supply, fill #0
  Filled 2023-01-21 – 2023-04-04 (×2): qty 270, 90d supply, fill #1

## 2023-01-05 ENCOUNTER — Other Ambulatory Visit: Payer: Self-pay

## 2023-01-05 ENCOUNTER — Other Ambulatory Visit (HOSPITAL_COMMUNITY): Payer: Self-pay

## 2023-01-19 ENCOUNTER — Other Ambulatory Visit (HOSPITAL_COMMUNITY): Payer: Self-pay

## 2023-01-19 ENCOUNTER — Other Ambulatory Visit: Payer: Self-pay | Admitting: Physical Medicine and Rehabilitation

## 2023-01-20 ENCOUNTER — Other Ambulatory Visit (HOSPITAL_COMMUNITY): Payer: Self-pay

## 2023-01-20 ENCOUNTER — Other Ambulatory Visit: Payer: Self-pay

## 2023-01-20 MED ORDER — OXYCODONE-ACETAMINOPHEN 10-325 MG PO TABS
1.0000 | ORAL_TABLET | Freq: Three times a day (TID) | ORAL | 0 refills | Status: DC | PRN
  Filled 2023-01-20: qty 90, 30d supply, fill #0

## 2023-01-21 ENCOUNTER — Other Ambulatory Visit (HOSPITAL_COMMUNITY): Payer: Self-pay

## 2023-01-21 ENCOUNTER — Other Ambulatory Visit: Payer: Self-pay | Admitting: Physical Medicine and Rehabilitation

## 2023-01-21 ENCOUNTER — Other Ambulatory Visit: Payer: Self-pay

## 2023-01-21 MED ORDER — OXYCODONE-ACETAMINOPHEN 10-325 MG PO TABS
1.0000 | ORAL_TABLET | Freq: Three times a day (TID) | ORAL | 0 refills | Status: DC | PRN
  Filled 2023-01-21 – 2023-02-17 (×2): qty 90, 30d supply, fill #0

## 2023-01-21 NOTE — Telephone Encounter (Signed)
Last fill per pmp   Filled  Written  ID  Drug  QTY  Days  Prescriber  RX #  Dispenser  Refill  Daily Dose*  Pymt Type  PMP  12/21/2022 11/29/2022 2  Oxycodone-Acetaminophen 10-325 90.00 30 Me Lov [id number] Con (0126) 0/0 45.00 MME Other [place]

## 2023-01-24 ENCOUNTER — Other Ambulatory Visit: Payer: Self-pay

## 2023-01-24 ENCOUNTER — Other Ambulatory Visit (HOSPITAL_COMMUNITY): Payer: Self-pay

## 2023-01-25 ENCOUNTER — Other Ambulatory Visit (HOSPITAL_COMMUNITY): Payer: Self-pay

## 2023-01-31 ENCOUNTER — Other Ambulatory Visit: Payer: Self-pay

## 2023-02-01 ENCOUNTER — Ambulatory Visit: Payer: Medicare Other | Admitting: Infectious Disease

## 2023-02-02 ENCOUNTER — Other Ambulatory Visit: Payer: Self-pay

## 2023-02-03 ENCOUNTER — Ambulatory Visit: Payer: 59 | Admitting: Infectious Disease

## 2023-02-04 ENCOUNTER — Other Ambulatory Visit (HOSPITAL_COMMUNITY): Payer: Self-pay

## 2023-02-07 ENCOUNTER — Other Ambulatory Visit (HOSPITAL_COMMUNITY): Payer: Self-pay

## 2023-02-07 ENCOUNTER — Encounter: Payer: Self-pay | Admitting: Infectious Disease

## 2023-02-07 ENCOUNTER — Ambulatory Visit (INDEPENDENT_AMBULATORY_CARE_PROVIDER_SITE_OTHER): Payer: 59 | Admitting: Infectious Disease

## 2023-02-07 ENCOUNTER — Other Ambulatory Visit: Payer: Self-pay

## 2023-02-07 VITALS — HR 90 | Temp 97.1°F

## 2023-02-07 DIAGNOSIS — H59032 Cystoid macular edema following cataract surgery, left eye: Secondary | ICD-10-CM

## 2023-02-07 DIAGNOSIS — E782 Mixed hyperlipidemia: Secondary | ICD-10-CM

## 2023-02-07 DIAGNOSIS — T847XXD Infection and inflammatory reaction due to other internal orthopedic prosthetic devices, implants and grafts, subsequent encounter: Secondary | ICD-10-CM

## 2023-02-07 DIAGNOSIS — B20 Human immunodeficiency virus [HIV] disease: Secondary | ICD-10-CM | POA: Diagnosis not present

## 2023-02-07 DIAGNOSIS — M6008 Infective myositis, other site: Secondary | ICD-10-CM

## 2023-02-07 DIAGNOSIS — E785 Hyperlipidemia, unspecified: Secondary | ICD-10-CM

## 2023-02-07 DIAGNOSIS — M4620 Osteomyelitis of vertebra, site unspecified: Secondary | ICD-10-CM | POA: Diagnosis not present

## 2023-02-07 DIAGNOSIS — T847XXA Infection and inflammatory reaction due to other internal orthopedic prosthetic devices, implants and grafts, initial encounter: Secondary | ICD-10-CM | POA: Insufficient documentation

## 2023-02-07 HISTORY — DX: Infection and inflammatory reaction due to other internal orthopedic prosthetic devices, implants and grafts, initial encounter: T84.7XXA

## 2023-02-07 MED ORDER — BIKTARVY 50-200-25 MG PO TABS
1.0000 | ORAL_TABLET | Freq: Every day | ORAL | 11 refills | Status: DC
Start: 2023-02-07 — End: 2023-08-10
  Filled 2023-02-07 – 2023-02-28 (×3): qty 30, 30d supply, fill #0
  Filled 2023-03-29: qty 30, 30d supply, fill #1
  Filled 2023-04-28: qty 30, 30d supply, fill #2
  Filled 2023-05-27: qty 30, 30d supply, fill #3
  Filled 2023-06-27: qty 30, 30d supply, fill #4
  Filled 2023-07-25: qty 30, 30d supply, fill #5

## 2023-02-07 MED ORDER — DOXYCYCLINE HYCLATE 100 MG PO TABS
100.0000 mg | ORAL_TABLET | Freq: Two times a day (BID) | ORAL | 11 refills | Status: DC
  Filled 2023-02-07 – 2023-05-02 (×2): qty 60, 30d supply, fill #0

## 2023-02-07 MED ORDER — CEFDINIR 300 MG PO CAPS
300.0000 mg | ORAL_CAPSULE | Freq: Two times a day (BID) | ORAL | 11 refills | Status: DC
  Filled 2023-02-07 – 2023-05-02 (×2): qty 60, 30d supply, fill #0
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
## 2023-02-08 ENCOUNTER — Other Ambulatory Visit (HOSPITAL_COMMUNITY): Payer: Self-pay

## 2023-02-08 LAB — RPR: RPR Ser Ql: NONREACTIVE

## 2023-02-08 LAB — COMPLETE METABOLIC PANEL WITH GFR
AST: 50 U/L — ABNORMAL HIGH (ref 10–35)
Albumin: 3.9 g/dL (ref 3.6–5.1)
Alkaline phosphatase (APISO): 108 U/L (ref 37–153)
Globulin: 2.5 g/dL (calc) (ref 1.9–3.7)
Glucose, Bld: 92 mg/dL (ref 65–99)
Sodium: 139 mmol/L (ref 135–146)
Total Protein: 6.4 g/dL (ref 6.1–8.1)
eGFR: 111 mL/min/{1.73_m2} (ref >=60)

## 2023-02-08 LAB — LIPID PANEL
Cholesterol: 137 mg/dL (ref <200)
HDL: 57 mg/dL (ref >=50)
Total CHOL/HDL Ratio: 2.4 (calc) (ref <5.0)

## 2023-02-08 LAB — T-HELPER CELLS (CD4) COUNT (NOT AT ARMC)
CD4 % Helper T Cell: 43 % (ref 33–65)
CD4 T Cell Abs: 612 /uL (ref 400–1790)

## 2023-02-08 LAB — HEPATITIS C AB W/RFL RNA, PCR + GENO: Hepatitis C Ab: NONREACTIVE

## 2023-02-10 LAB — COMPLETE METABOLIC PANEL WITH GFR
AG Ratio: 1.6 (calc) (ref 1.0–2.5)
ALT: 68 U/L — ABNORMAL HIGH (ref 6–29)
BUN: 12 mg/dL (ref 7–25)
CO2: 24 mmol/L (ref 20–32)
Calcium: 9.5 mg/dL (ref 8.6–10.4)
Chloride: 107 mmol/L (ref 98–110)
Creat: 0.5 mg/dL (ref 0.50–1.03)
Potassium: 3.9 mmol/L (ref 3.5–5.3)
Total Bilirubin: 0.4 mg/dL (ref 0.2–1.2)

## 2023-02-10 LAB — HIV-1 RNA QUANT-NO REFLEX-BLD
HIV 1 RNA Quant: NOT DETECTED Copies/mL
HIV-1 RNA Quant, Log: NOT DETECTED Log cps/mL

## 2023-02-10 LAB — LIPID PANEL
LDL Cholesterol (Calc): 65 mg/dL (calc)
Non-HDL Cholesterol (Calc): 80 mg/dL (calc) (ref <130)
Triglycerides: 74 mg/dL (ref <150)

## 2023-02-10 LAB — C-REACTIVE PROTEIN: CRP: <3 mg/L (ref <8.0)

## 2023-02-14 ENCOUNTER — Other Ambulatory Visit (HOSPITAL_COMMUNITY): Payer: Self-pay

## 2023-02-16 ENCOUNTER — Other Ambulatory Visit (HOSPITAL_COMMUNITY): Payer: Self-pay

## 2023-02-17 ENCOUNTER — Other Ambulatory Visit: Payer: Self-pay

## 2023-02-17 ENCOUNTER — Other Ambulatory Visit (HOSPITAL_COMMUNITY): Payer: Self-pay

## 2023-02-22 ENCOUNTER — Ambulatory Visit (INDEPENDENT_AMBULATORY_CARE_PROVIDER_SITE_OTHER): Payer: 59 | Admitting: Podiatry

## 2023-02-22 ENCOUNTER — Other Ambulatory Visit (HOSPITAL_COMMUNITY): Payer: Self-pay

## 2023-02-22 ENCOUNTER — Encounter: Payer: Self-pay | Admitting: Podiatry

## 2023-02-22 ENCOUNTER — Ambulatory Visit: Payer: 59 | Admitting: Podiatry

## 2023-02-22 DIAGNOSIS — E1142 Type 2 diabetes mellitus with diabetic polyneuropathy: Secondary | ICD-10-CM

## 2023-02-22 DIAGNOSIS — M79675 Pain in left toe(s): Secondary | ICD-10-CM | POA: Diagnosis not present

## 2023-02-22 DIAGNOSIS — B351 Tinea unguium: Secondary | ICD-10-CM

## 2023-02-22 DIAGNOSIS — G8220 Paraplegia, unspecified: Secondary | ICD-10-CM

## 2023-02-22 DIAGNOSIS — M79674 Pain in right toe(s): Secondary | ICD-10-CM
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
## 2023-02-23 ENCOUNTER — Other Ambulatory Visit (HOSPITAL_COMMUNITY): Payer: Self-pay

## 2023-02-26 ENCOUNTER — Other Ambulatory Visit (HOSPITAL_COMMUNITY): Payer: Self-pay

## 2023-02-28 ENCOUNTER — Other Ambulatory Visit (HOSPITAL_COMMUNITY): Payer: Self-pay

## 2023-02-28 ENCOUNTER — Other Ambulatory Visit: Payer: Self-pay

## 2023-03-01 ENCOUNTER — Other Ambulatory Visit (HOSPITAL_COMMUNITY): Payer: Self-pay

## 2023-03-04 ENCOUNTER — Encounter: Payer: 59 | Attending: Physical Medicine and Rehabilitation | Admitting: Physical Medicine and Rehabilitation

## 2023-03-04 ENCOUNTER — Other Ambulatory Visit (HOSPITAL_COMMUNITY): Payer: Self-pay

## 2023-03-04 ENCOUNTER — Other Ambulatory Visit: Payer: Self-pay

## 2023-03-04 ENCOUNTER — Encounter: Payer: Self-pay | Admitting: Physical Medicine and Rehabilitation

## 2023-03-04 VITALS — BP 96/66 | HR 103 | Ht 63.0 in

## 2023-03-04 DIAGNOSIS — Z5181 Encounter for therapeutic drug level monitoring: Secondary | ICD-10-CM | POA: Diagnosis present

## 2023-03-04 DIAGNOSIS — Z79891 Long term (current) use of opiate analgesic: Secondary | ICD-10-CM

## 2023-03-04 DIAGNOSIS — G8220 Paraplegia, unspecified: Secondary | ICD-10-CM | POA: Diagnosis present

## 2023-03-04 DIAGNOSIS — G8921 Chronic pain due to trauma: Secondary | ICD-10-CM | POA: Diagnosis present

## 2023-03-04 DIAGNOSIS — G894 Chronic pain syndrome: Secondary | ICD-10-CM

## 2023-03-04 MED ORDER — PANTOPRAZOLE SODIUM 40 MG PO TBEC
40.0000 mg | DELAYED_RELEASE_TABLET | Freq: Every day | ORAL | 0 refills | Status: DC
  Filled 2023-03-04: qty 90, 90d supply, fill #0

## 2023-03-04 MED ORDER — OXYCODONE-ACETAMINOPHEN 10-325 MG PO TABS
1.0000 | ORAL_TABLET | Freq: Three times a day (TID) | ORAL | 0 refills | Status: DC | PRN
  Filled 2023-03-04 – 2023-03-18 (×3): qty 90, 30d supply, fill #0

## 2023-03-04 NOTE — Patient Instructions (Addendum)
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  Here for  f/u- was discharged from [facility] 11/04/21.  F/u for paraplegia  Also has chronic pain on pain meds- Percocet 10/325 mg .  Also w/c from 04/2019.    If back gets worse, let me know and can get in for trigger point injections.   -just numbing medicine- like if you go to dentist- no steroids in it.    2. Didn't bring transfer board= so needs to bring to transfer so can weigh her.   3. Will refill Percocet 10/325 mg  up to 3x/day as needed #90- last refill 5/2- so not due yet, but will have to call pharmacy to get refill next month.  Last refilled 5/2- and has 60 pills left.  Wasn't taking  3c/day since has missed them due to abd pain lately.    4. Con't theracane- can do daily if need be- if any questions, can use you tube- to see how to use.    5.  Since having burning reflux symptoms, will try Protonix 40 mg daily- will send in 3 months supply and get refills from PCP in future.   6. Needs to take pain meds with food!- need to take with something- 1/2 banana, crackers, or some cereal or cheese/milk- to help reduce reflux symptoms.   7. There are wedges you can get on [employer]- elevate the Head of bed somehow with the wedge to bring head of bed up- that helps acid stay in stomach, not in throat!  8. W/C is 55 years old- due for replacement in July 2020- so due in 1 year can be replaced.   9. W/C cushion- is ROHO- is due for replacement 04/2024 as well.   10. F/U in 3 months- double SCI appt.    11. Oral drug screen today. Per clinic policy.
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
## 2023-03-07 ENCOUNTER — Other Ambulatory Visit: Payer: Self-pay

## 2023-03-08 LAB — DRUG TOX MONITOR 1 W/CONF, ORAL FLD
Amphetamines: NEGATIVE ng/mL (ref <10)
Barbiturates: NEGATIVE ng/mL (ref <10)
Benzodiazepines: NEGATIVE ng/mL (ref <0.50)
Buprenorphine: NEGATIVE ng/mL (ref <0.10)
Cocaine: NEGATIVE ng/mL (ref <5.0)
Codeine: NEGATIVE ng/mL (ref <2.5)
Dihydrocodeine: NEGATIVE ng/mL (ref <2.5)
Fentanyl: NEGATIVE ng/mL (ref <0.10)
Heroin Metabolite: NEGATIVE ng/mL (ref <1.0)
Hydrocodone: NEGATIVE ng/mL (ref <2.5)
Hydromorphone: NEGATIVE ng/mL (ref <2.5)
MARIJUANA: NEGATIVE ng/mL (ref <2.5)
MDMA: NEGATIVE ng/mL (ref <10)
Meprobamate: NEGATIVE ng/mL (ref <2.5)
Methadone: NEGATIVE ng/mL (ref <5.0)
Morphine: NEGATIVE ng/mL (ref <2.5)
Nicotine Metabolite: NEGATIVE ng/mL (ref <5.0)
Norhydrocodone: NEGATIVE ng/mL (ref <2.5)
Noroxycodone: NEGATIVE ng/mL (ref <2.5)
Opiates: POSITIVE ng/mL — AB (ref <2.5)
Oxycodone: >250 ng/mL — ABNORMAL HIGH (ref <2.5)
Oxymorphone: NEGATIVE ng/mL (ref <2.5)
Phencyclidine: NEGATIVE ng/mL (ref <10)
Tapentadol: NEGATIVE ng/mL (ref <5.0)
Tramadol: NEGATIVE ng/mL (ref <5.0)
Zolpidem: NEGATIVE ng/mL (ref <5.0)

## 2023-03-08 LAB — DRUG TOX ALC METAB W/CON, ORAL FLD: Alcohol Metabolite: NEGATIVE ng/mL (ref <25)

## 2023-03-18 ENCOUNTER — Other Ambulatory Visit (HOSPITAL_COMMUNITY): Payer: Self-pay

## 2023-03-21 ENCOUNTER — Other Ambulatory Visit (HOSPITAL_COMMUNITY): Payer: Self-pay

## 2023-03-23 ENCOUNTER — Other Ambulatory Visit (HOSPITAL_COMMUNITY): Payer: Self-pay

## 2023-03-24 ENCOUNTER — Telehealth: Payer: Self-pay | Admitting: *Deleted

## 2023-03-24 NOTE — Telephone Encounter (Signed)
Oral swab drug screen was consistent for prescribed medications.  ?

## 2023-03-25 ENCOUNTER — Other Ambulatory Visit (HOSPITAL_COMMUNITY): Payer: Self-pay

## 2023-03-25 ENCOUNTER — Other Ambulatory Visit: Payer: Self-pay

## 2023-03-29 ENCOUNTER — Other Ambulatory Visit (HOSPITAL_COMMUNITY): Payer: Self-pay

## 2023-03-29 ENCOUNTER — Other Ambulatory Visit: Payer: Self-pay

## 2023-03-31 ENCOUNTER — Other Ambulatory Visit (HOSPITAL_COMMUNITY): Payer: Self-pay

## 2023-04-04 ENCOUNTER — Other Ambulatory Visit (HOSPITAL_COMMUNITY): Payer: Self-pay

## 2023-04-16 ENCOUNTER — Other Ambulatory Visit (HOSPITAL_COMMUNITY): Payer: Self-pay

## 2023-04-16 ENCOUNTER — Other Ambulatory Visit: Payer: Self-pay | Admitting: Physical Medicine and Rehabilitation

## 2023-04-18 ENCOUNTER — Other Ambulatory Visit (HOSPITAL_COMMUNITY): Payer: Self-pay

## 2023-04-19 ENCOUNTER — Other Ambulatory Visit: Payer: Self-pay

## 2023-04-19 ENCOUNTER — Other Ambulatory Visit (HOSPITAL_COMMUNITY): Payer: Self-pay

## 2023-04-19 ENCOUNTER — Other Ambulatory Visit: Payer: Self-pay | Admitting: Physical Medicine and Rehabilitation

## 2023-04-19 MED ORDER — OXYCODONE-ACETAMINOPHEN 10-325 MG PO TABS
1.0000 | ORAL_TABLET | Freq: Three times a day (TID) | ORAL | 0 refills | Status: DC | PRN
  Filled 2023-04-19: qty 90, 30d supply, fill #0

## 2023-04-19 MED ORDER — OXYCODONE-ACETAMINOPHEN 10-325 MG PO TABS
1.0000 | ORAL_TABLET | Freq: Three times a day (TID) | ORAL | 0 refills | Status: DC | PRN
  Filled 2023-04-19 – 2023-05-18 (×2): qty 90, 30d supply, fill #0

## 2023-04-20 ENCOUNTER — Other Ambulatory Visit (HOSPITAL_COMMUNITY): Payer: Self-pay

## 2023-04-20 ENCOUNTER — Other Ambulatory Visit: Payer: Self-pay

## 2023-04-22 ENCOUNTER — Other Ambulatory Visit (HOSPITAL_COMMUNITY): Payer: Self-pay

## 2023-04-22 ENCOUNTER — Other Ambulatory Visit: Payer: Self-pay

## 2023-04-22 MED ORDER — OMEPRAZOLE 40 MG PO CPDR
40.0000 mg | DELAYED_RELEASE_CAPSULE | Freq: Every day | ORAL | 2 refills | Status: DC
  Filled 2023-04-22: qty 30, 30d supply, fill #0

## 2023-04-22 MED ORDER — ONDANSETRON 4 MG PO TBDP
4.0000 mg | ORAL_TABLET | Freq: Three times a day (TID) | ORAL | 0 refills | Status: DC | PRN
  Filled 2023-04-22: qty 20, 7d supply, fill #0

## 2023-04-25 ENCOUNTER — Other Ambulatory Visit (HOSPITAL_COMMUNITY): Payer: Self-pay

## 2023-04-25 ENCOUNTER — Encounter (HOSPITAL_COMMUNITY): Payer: Self-pay

## 2023-04-28 ENCOUNTER — Other Ambulatory Visit (HOSPITAL_COMMUNITY): Payer: Self-pay

## 2023-04-29 ENCOUNTER — Other Ambulatory Visit (HOSPITAL_COMMUNITY): Payer: Self-pay

## 2023-04-29 MED ORDER — OZEMPIC (2 MG/DOSE) 8 MG/3ML ~~LOC~~ SOPN
2.0000 mg | PEN_INJECTOR | SUBCUTANEOUS | 5 refills | Status: DC
  Filled 2023-04-29: qty 3, 28d supply, fill #0
  Filled 2023-05-27: qty 3, 28d supply, fill #1
  Filled 2023-06-27: qty 3, 28d supply, fill #2
  Filled 2023-07-21: qty 3, 28d supply, fill #3
  Filled 2023-08-22: qty 3, 28d supply, fill #4
  Filled 2023-09-19: qty 3, 28d supply, fill #5

## 2023-05-02 ENCOUNTER — Other Ambulatory Visit (HOSPITAL_COMMUNITY): Payer: Self-pay

## 2023-05-02 ENCOUNTER — Other Ambulatory Visit: Payer: Self-pay | Admitting: Infectious Disease

## 2023-05-02 ENCOUNTER — Other Ambulatory Visit: Payer: Self-pay

## 2023-05-02 MED ORDER — CEFDINIR 300 MG PO CAPS
300.0000 mg | ORAL_CAPSULE | Freq: Two times a day (BID) | ORAL | 0 refills | Status: DC
  Filled 2023-05-02: qty 180, 90d supply, fill #0

## 2023-05-02 MED ORDER — DOXYCYCLINE HYCLATE 100 MG PO TABS
100.0000 mg | ORAL_TABLET | Freq: Two times a day (BID) | ORAL | 0 refills | Status: DC
  Filled 2023-05-02: qty 180, 90d supply, fill #0

## 2023-05-18 ENCOUNTER — Other Ambulatory Visit (HOSPITAL_COMMUNITY): Payer: Self-pay

## 2023-05-18 ENCOUNTER — Other Ambulatory Visit: Payer: Self-pay

## 2023-05-23 ENCOUNTER — Other Ambulatory Visit (HOSPITAL_COMMUNITY): Payer: Self-pay

## 2023-05-27 ENCOUNTER — Other Ambulatory Visit: Payer: Self-pay

## 2023-05-27 ENCOUNTER — Other Ambulatory Visit (HOSPITAL_COMMUNITY): Payer: Self-pay

## 2023-06-01 ENCOUNTER — Encounter: Payer: Self-pay | Admitting: Podiatry

## 2023-06-01 ENCOUNTER — Ambulatory Visit (INDEPENDENT_AMBULATORY_CARE_PROVIDER_SITE_OTHER): Payer: 59 | Admitting: Podiatry

## 2023-06-01 DIAGNOSIS — E1142 Type 2 diabetes mellitus with diabetic polyneuropathy: Secondary | ICD-10-CM

## 2023-06-01 DIAGNOSIS — B351 Tinea unguium: Secondary | ICD-10-CM | POA: Diagnosis not present

## 2023-06-01 DIAGNOSIS — M79674 Pain in right toe(s): Secondary | ICD-10-CM | POA: Diagnosis not present

## 2023-06-01 DIAGNOSIS — M79675 Pain in left toe(s): Secondary | ICD-10-CM
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
## 2023-06-10 ENCOUNTER — Encounter: Payer: Self-pay | Admitting: Physical Medicine and Rehabilitation

## 2023-06-10 ENCOUNTER — Other Ambulatory Visit (HOSPITAL_COMMUNITY): Payer: Self-pay

## 2023-06-10 ENCOUNTER — Encounter: Payer: 59 | Attending: Physical Medicine and Rehabilitation | Admitting: Physical Medicine and Rehabilitation

## 2023-06-10 ENCOUNTER — Other Ambulatory Visit: Payer: Self-pay

## 2023-06-10 VITALS — BP 102/70 | HR 81 | Ht 63.0 in

## 2023-06-10 DIAGNOSIS — G8220 Paraplegia, unspecified: Secondary | ICD-10-CM | POA: Insufficient documentation

## 2023-06-10 DIAGNOSIS — M7918 Myalgia, other site: Secondary | ICD-10-CM | POA: Diagnosis present

## 2023-06-10 DIAGNOSIS — Z993 Dependence on wheelchair: Secondary | ICD-10-CM | POA: Diagnosis not present

## 2023-06-10 DIAGNOSIS — R252 Cramp and spasm: Secondary | ICD-10-CM | POA: Diagnosis not present

## 2023-06-10 DIAGNOSIS — M6530 Trigger finger, unspecified finger: Secondary | ICD-10-CM | POA: Diagnosis not present

## 2023-06-10 DIAGNOSIS — M4620 Osteomyelitis of vertebra, site unspecified: Secondary | ICD-10-CM | POA: Insufficient documentation

## 2023-06-10 MED ORDER — BACLOFEN 10 MG PO TABS
10.0000 mg | ORAL_TABLET | Freq: Three times a day (TID) | ORAL | 1 refills | Status: DC
  Filled 2023-06-10 – 2023-06-27 (×2): qty 270, 90d supply, fill #0
  Filled 2023-10-03: qty 270, 90d supply, fill #1

## 2023-06-10 MED ORDER — OXYCODONE-ACETAMINOPHEN 10-325 MG PO TABS
1.0000 | ORAL_TABLET | Freq: Three times a day (TID) | ORAL | 0 refills | Status: DC | PRN
  Filled 2023-06-10 – 2023-07-14 (×2): qty 90, 30d supply, fill #0

## 2023-06-10 MED ORDER — OXYCODONE-ACETAMINOPHEN 10-325 MG PO TABS
1.0000 | ORAL_TABLET | Freq: Three times a day (TID) | ORAL | 0 refills | Status: DC | PRN
  Filled 2023-06-10 – 2023-06-14 (×2): qty 90, 30d supply, fill #0

## 2023-06-10 NOTE — Patient Instructions (Signed)
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  Here for  f/u- was discharged from [facility] 11/04/21.  F/u for paraplegia  Will bring manual w/c to get weighed. Next visit.    2.  Concerned about steroid injections, but explained it's the only treatment for R thumb- has trigger finger- - will refer to Orthopedics for trigger finger steroid injections.    3. Will refill percocet-  10/325 mg  q8 hours as needed # 90. Will also send in for July 11, 2023- and sent all to [facility]   4. Is on Ozempic- which is working well- 1x/week- A1c 6.3- so plans on keeping current treatment  5. Researched her labs from early August 2024- ALT is 34- normal is 32- and Alk phos very slightly elevated- very trace at 126 (normal 121) .  Says on HIV meds, and thinks from that. Speak with Dr Algis Liming- and let him know to look them up and assess for pt as well. AST 50 and ALT was 68 in 4/24.   6.   We discussed getting new imaging- so I will defer to ID- in next couple of months- last saw  him 02/07/23-  if needs more thoracic MRI?  7. Refill Baclofen- did research- thought we had switched, but had NOT- would not do dantrolene due to LFT elevation for awhile-  8.  F/U in 3 months double appt- SCI
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
## 2023-06-14 ENCOUNTER — Other Ambulatory Visit: Payer: Self-pay

## 2023-06-14 ENCOUNTER — Other Ambulatory Visit (HOSPITAL_COMMUNITY): Payer: Self-pay

## 2023-06-21 ENCOUNTER — Encounter (HOSPITAL_COMMUNITY): Payer: Self-pay

## 2023-06-21 ENCOUNTER — Other Ambulatory Visit (HOSPITAL_COMMUNITY): Payer: Self-pay

## 2023-06-27 ENCOUNTER — Other Ambulatory Visit: Payer: Self-pay

## 2023-06-27 ENCOUNTER — Other Ambulatory Visit (HOSPITAL_COMMUNITY): Payer: Self-pay

## 2023-07-04 ENCOUNTER — Other Ambulatory Visit (HOSPITAL_COMMUNITY): Payer: Self-pay

## 2023-07-07 ENCOUNTER — Other Ambulatory Visit (HOSPITAL_COMMUNITY): Payer: Self-pay

## 2023-07-13 ENCOUNTER — Ambulatory Visit: Payer: 59 | Admitting: Orthopedic Surgery

## 2023-07-13 ENCOUNTER — Other Ambulatory Visit (HOSPITAL_COMMUNITY): Payer: Self-pay

## 2023-07-13 ENCOUNTER — Other Ambulatory Visit: Payer: Self-pay

## 2023-07-14 ENCOUNTER — Other Ambulatory Visit: Payer: Self-pay

## 2023-07-14 ENCOUNTER — Other Ambulatory Visit (HOSPITAL_COMMUNITY): Payer: Self-pay

## 2023-07-18 ENCOUNTER — Other Ambulatory Visit: Payer: Self-pay

## 2023-07-19 ENCOUNTER — Other Ambulatory Visit: Payer: Self-pay | Admitting: Nurse Practitioner

## 2023-07-19 DIAGNOSIS — Z1231 Encounter for screening mammogram for malignant neoplasm of breast: Secondary | ICD-10-CM

## 2023-07-20 ENCOUNTER — Other Ambulatory Visit: Payer: Self-pay

## 2023-07-21 ENCOUNTER — Other Ambulatory Visit (HOSPITAL_COMMUNITY): Payer: Self-pay

## 2023-07-25 ENCOUNTER — Other Ambulatory Visit: Payer: Self-pay

## 2023-07-25 ENCOUNTER — Other Ambulatory Visit (HOSPITAL_COMMUNITY): Payer: Self-pay

## 2023-07-25 NOTE — Progress Notes (Signed)
Patient was contacted regarding delay in delivery. Medication will be shipped 07/26/23, and pt will receive delivery on 07/27/23 to Verified address: [address] [place] [address].

## 2023-07-25 NOTE — Progress Notes (Signed)
Specialty Pharmacy Refill Coordination Note  Ellen Lane is a 57 y.o. female contacted today regarding refills of specialty medication(s) Bictegravir-Emtricitab-Tenofov   Patient requested Delivery   Delivery date: 07/25/23   Verified address: [address] [place] [id number]   Medication will be filled on 07/25/23.   Please ship today 07/25/23.

## 2023-07-29 ENCOUNTER — Other Ambulatory Visit (HOSPITAL_COMMUNITY): Payer: Self-pay

## 2023-07-29 ENCOUNTER — Other Ambulatory Visit: Payer: Self-pay

## 2023-08-04 ENCOUNTER — Other Ambulatory Visit: Payer: Self-pay | Admitting: Infectious Disease

## 2023-08-04 ENCOUNTER — Other Ambulatory Visit (HOSPITAL_COMMUNITY): Payer: Self-pay

## 2023-08-05 ENCOUNTER — Other Ambulatory Visit: Payer: Self-pay

## 2023-08-05 MED ORDER — CEFDINIR 300 MG PO CAPS
300.0000 mg | ORAL_CAPSULE | Freq: Two times a day (BID) | ORAL | 0 refills | Status: DC
  Filled 2023-08-05: qty 180, 90d supply, fill #0

## 2023-08-05 MED ORDER — DOXYCYCLINE HYCLATE 100 MG PO TABS
100.0000 mg | ORAL_TABLET | Freq: Two times a day (BID) | ORAL | 0 refills | Status: DC
  Filled 2023-08-05: qty 180, 90d supply, fill #0

## 2023-08-09 ENCOUNTER — Telehealth: Payer: Self-pay | Admitting: Infectious Disease

## 2023-08-09 NOTE — Telephone Encounter (Signed)
error 

## 2023-08-10 ENCOUNTER — Other Ambulatory Visit (HOSPITAL_COMMUNITY): Payer: Self-pay

## 2023-08-10 ENCOUNTER — Other Ambulatory Visit: Payer: Self-pay

## 2023-08-10 ENCOUNTER — Ambulatory Visit (INDEPENDENT_AMBULATORY_CARE_PROVIDER_SITE_OTHER): Payer: 59 | Admitting: Infectious Disease

## 2023-08-10 VITALS — BP 102/71 | HR 103 | Temp 97.1°F

## 2023-08-10 DIAGNOSIS — N3289 Other specified disorders of bladder: Secondary | ICD-10-CM

## 2023-08-10 DIAGNOSIS — I10 Essential (primary) hypertension: Secondary | ICD-10-CM | POA: Diagnosis not present

## 2023-08-10 DIAGNOSIS — E782 Mixed hyperlipidemia: Secondary | ICD-10-CM

## 2023-08-10 DIAGNOSIS — Z9884 Bariatric surgery status: Secondary | ICD-10-CM

## 2023-08-10 DIAGNOSIS — E119 Type 2 diabetes mellitus without complications: Secondary | ICD-10-CM

## 2023-08-10 DIAGNOSIS — B20 Human immunodeficiency virus [HIV] disease: Secondary | ICD-10-CM

## 2023-08-10 DIAGNOSIS — Z23 Encounter for immunization: Secondary | ICD-10-CM | POA: Diagnosis not present

## 2023-08-10 DIAGNOSIS — M4624 Osteomyelitis of vertebra, thoracic region: Secondary | ICD-10-CM

## 2023-08-10 DIAGNOSIS — Z981 Arthrodesis status: Secondary | ICD-10-CM

## 2023-08-10 DIAGNOSIS — G8220 Paraplegia, unspecified: Secondary | ICD-10-CM

## 2023-08-10 DIAGNOSIS — K219 Gastro-esophageal reflux disease without esophagitis: Secondary | ICD-10-CM

## 2023-08-10 DIAGNOSIS — M4620 Osteomyelitis of vertebra, site unspecified: Secondary | ICD-10-CM

## 2023-08-10 MED ORDER — BIKTARVY 50-200-25 MG PO TABS
1.0000 | ORAL_TABLET | Freq: Every day | ORAL | 11 refills | Status: DC
Start: 2023-08-10 — End: 2024-03-20
  Filled 2023-08-10 – 2023-08-25 (×2): qty 30, 30d supply, fill #0
  Filled 2023-09-19: qty 30, 30d supply, fill #1
  Filled 2023-10-20: qty 30, 30d supply, fill #2
  Filled 2023-11-17: qty 30, 30d supply, fill #3
  Filled 2023-12-19: qty 30, 30d supply, fill #4
  Filled 2024-01-05 (×2): qty 30, 30d supply, fill #5
  Filled 2024-02-09: qty 30, 30d supply, fill #6
  Filled 2024-03-01: qty 30, 30d supply, fill #7

## 2023-08-10 MED ORDER — ATORVASTATIN CALCIUM 20 MG PO TABS
20.0000 mg | ORAL_TABLET | Freq: Every day | ORAL | 3 refills | Status: DC
  Filled 2023-08-10 – 2023-09-29 (×2): qty 90, 90d supply, fill #0
  Filled 2023-12-29: qty 90, 90d supply, fill #1

## 2023-08-10 MED ORDER — CEFDINIR 300 MG PO CAPS
300.0000 mg | ORAL_CAPSULE | Freq: Two times a day (BID) | ORAL | 3 refills | Status: DC
  Filled 2023-08-10 – 2023-11-03 (×2): qty 180, 90d supply, fill #0
  Filled 2024-02-07: qty 180, 90d supply, fill #1

## 2023-08-10 MED ORDER — DOXYCYCLINE HYCLATE 100 MG PO TABS
100.0000 mg | ORAL_TABLET | Freq: Two times a day (BID) | ORAL | 3 refills | Status: DC
  Filled 2023-08-10 – 2023-11-03 (×2): qty 180, 90d supply, fill #0
  Filled 2024-02-07: qty 180, 90d supply, fill #1

## 2023-08-10 NOTE — Addendum Note (Signed)
Addended by: Harley Alto on: 08/10/2023 04:17 PM   Modules accepted: Orders
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
## 2023-08-11 ENCOUNTER — Other Ambulatory Visit (HOSPITAL_COMMUNITY): Payer: Self-pay

## 2023-08-11 LAB — T-HELPER CELLS (CD4) COUNT (NOT AT ARMC)
CD4 % Helper T Cell: 46 % (ref 33–65)
CD4 T Cell Abs: 753 /uL (ref 400–1790)

## 2023-08-12 ENCOUNTER — Other Ambulatory Visit (HOSPITAL_COMMUNITY): Payer: Self-pay

## 2023-08-12 ENCOUNTER — Other Ambulatory Visit: Payer: Self-pay | Admitting: Physical Medicine and Rehabilitation

## 2023-08-12 ENCOUNTER — Other Ambulatory Visit (HOSPITAL_BASED_OUTPATIENT_CLINIC_OR_DEPARTMENT_OTHER): Payer: Self-pay

## 2023-08-12 MED ORDER — OXYCODONE-ACETAMINOPHEN 10-325 MG PO TABS
1.0000 | ORAL_TABLET | Freq: Three times a day (TID) | ORAL | 0 refills | Status: DC | PRN
  Filled 2023-08-12: qty 90, 30d supply, fill #0

## 2023-08-13 ENCOUNTER — Other Ambulatory Visit (HOSPITAL_COMMUNITY): Payer: Self-pay

## 2023-08-13 LAB — CBC WITH DIFFERENTIAL/PLATELET
Absolute Lymphocytes: 1850 {cells}/uL (ref 850–3900)
Absolute Monocytes: 241 {cells}/uL (ref 200–950)
Basophils Absolute: 22 {cells}/uL (ref 0–200)
Basophils Relative: 0.6 %
Eosinophils Absolute: 29 {cells}/uL (ref 15–500)
Eosinophils Relative: 0.8 %
HCT: 33.9 % — ABNORMAL LOW (ref 35.0–45.0)
Hemoglobin: 10.4 g/dL — ABNORMAL LOW (ref 11.7–15.5)
MCH: 25.4 pg — ABNORMAL LOW (ref 27.0–33.0)
MCHC: 30.7 g/dL — ABNORMAL LOW (ref 32.0–36.0)
MCV: 82.9 fL (ref 80.0–100.0)
MPV: 11.7 fL (ref 7.5–12.5)
Monocytes Relative: 6.7 %
Neutro Abs: 1458 {cells}/uL — ABNORMAL LOW (ref 1500–7800)
Neutrophils Relative %: 40.5 %
Platelets: 255 10*3/uL (ref 140–400)
RBC: 4.09 10*6/uL (ref 3.80–5.10)
RDW: 15.7 % — ABNORMAL HIGH (ref 11.0–15.0)
Total Lymphocyte: 51.4 %
WBC: 3.6 10*3/uL — ABNORMAL LOW (ref 3.8–10.8)

## 2023-08-13 LAB — COMPLETE METABOLIC PANEL WITH GFR
AG Ratio: 1.4 (calc) (ref 1.0–2.5)
ALT: 41 U/L — ABNORMAL HIGH (ref 6–29)
AST: 32 U/L (ref 10–35)
Albumin: 3.8 g/dL (ref 3.6–5.1)
Alkaline phosphatase (APISO): 111 U/L (ref 37–153)
BUN: 7 mg/dL (ref 7–25)
CO2: 22 mmol/L (ref 20–32)
Calcium: 9.5 mg/dL (ref 8.6–10.4)
Chloride: 104 mmol/L (ref 98–110)
Creat: 0.51 mg/dL (ref 0.50–1.03)
Globulin: 2.7 g/dL (ref 1.9–3.7)
Glucose, Bld: 75 mg/dL (ref 65–99)
Potassium: 3.4 mmol/L — ABNORMAL LOW (ref 3.5–5.3)
Sodium: 141 mmol/L (ref 135–146)
Total Bilirubin: 0.5 mg/dL (ref 0.2–1.2)
Total Protein: 6.5 g/dL (ref 6.1–8.1)
eGFR: 110 mL/min/{1.73_m2} (ref >=60)

## 2023-08-13 LAB — LIPID PANEL
Cholesterol: 132 mg/dL (ref <200)
HDL: 52 mg/dL (ref >=50)
LDL Cholesterol (Calc): 64 mg/dL
Non-HDL Cholesterol (Calc): 80 mg/dL (ref <130)
Total CHOL/HDL Ratio: 2.5 (calc) (ref <5.0)
Triglycerides: 76 mg/dL (ref <150)

## 2023-08-13 LAB — RPR: RPR Ser Ql: NONREACTIVE

## 2023-08-13 LAB — C-REACTIVE PROTEIN: CRP: <3 mg/L (ref <8.0)

## 2023-08-13 LAB — HIV-1 RNA QUANT-NO REFLEX-BLD
HIV 1 RNA Quant: NOT DETECTED {copies}/mL
HIV-1 RNA Quant, Log: NOT DETECTED {Log_copies}/mL

## 2023-08-13 LAB — SEDIMENTATION RATE: Sed Rate: 22 mm/h (ref 0–30)

## 2023-08-15 ENCOUNTER — Other Ambulatory Visit (HOSPITAL_COMMUNITY): Payer: Self-pay

## 2023-08-16 ENCOUNTER — Other Ambulatory Visit: Payer: Self-pay | Admitting: Pharmacist

## 2023-08-16 NOTE — Progress Notes (Signed)
Specialty Pharmacy Ongoing Clinical Assessment Note  Ellen Lane is a 57 y.o. female who is being followed by the specialty pharmacy service for RxSp HIV   Patient's specialty medication(s) reviewed today: Bictegravir-Emtricitab-Tenofov   Missed doses in the last 4 weeks: 0   Patient/Caregiver did not have any additional questions or concerns.   Therapeutic benefit summary: Patient is achieving benefit   Adverse events/side effects summary: No adverse events/side effects   Patient's therapy is appropriate to: Continue    Goals Addressed             This Visit's Progress    Achieve Undetectable HIV Viral Load < 20       Patient is on track. Patient will maintain adherence      Comply with lab assessments       Patient is on track. Patient will maintain adherence      Increase CD4 count until steady state       Patient is on track. Patient will maintain adherence      Maintain optimal adherence to therapy       Patient is on track. Patient will maintain adherence         Follow up:  6 months  Jennette Kettle Specialty Pharmacist

## 2023-08-22 ENCOUNTER — Other Ambulatory Visit: Payer: Self-pay

## 2023-08-22 ENCOUNTER — Other Ambulatory Visit (HOSPITAL_COMMUNITY): Payer: Self-pay

## 2023-08-24 ENCOUNTER — Other Ambulatory Visit: Payer: Self-pay

## 2023-08-24 ENCOUNTER — Ambulatory Visit
Admission: RE | Admit: 2023-08-24 | Discharge: 2023-08-24 | Disposition: A | Discharge status: Home / Self Care | Payer: 59 | Source: Ambulatory Visit | Attending: Nurse Practitioner | Admitting: Nurse Practitioner

## 2023-08-24 DIAGNOSIS — Z1231 Encounter for screening mammogram for malignant neoplasm of breast: Secondary | ICD-10-CM

## 2023-08-25 ENCOUNTER — Other Ambulatory Visit (HOSPITAL_COMMUNITY): Payer: Self-pay

## 2023-08-25 ENCOUNTER — Other Ambulatory Visit: Payer: Self-pay

## 2023-08-25 NOTE — Progress Notes (Signed)
Specialty Pharmacy Refill Coordination Note  Ellen Lane is a 57 y.o. female contacted today regarding refills of specialty medication(s) Bictegravir-Emtricitab-Tenofov   Patient requested Delivery   Delivery date: 08/29/23   Verified address: [address] [place] [id number]   Medication will be filled on 08/26/23.

## 2023-08-26 ENCOUNTER — Ambulatory Visit: Payer: 59 | Admitting: Orthopedic Surgery

## 2023-08-31 ENCOUNTER — Other Ambulatory Visit (HOSPITAL_COMMUNITY): Payer: Self-pay

## 2023-09-06 ENCOUNTER — Ambulatory Visit (INDEPENDENT_AMBULATORY_CARE_PROVIDER_SITE_OTHER): Payer: 59 | Admitting: Orthopaedic Surgery

## 2023-09-06 ENCOUNTER — Encounter: Payer: Self-pay | Admitting: Orthopaedic Surgery

## 2023-09-06 DIAGNOSIS — M65311 Trigger thumb, right thumb: Secondary | ICD-10-CM

## 2023-09-06 MED ORDER — METHYLPREDNISOLONE ACETATE 40 MG/ML IJ SUSP
13.3300 mg | INTRAMUSCULAR | Status: AC | PRN
  Administered 2023-09-06: 13.33 mg

## 2023-09-06 MED ORDER — BUPIVACAINE HCL 0.5 % IJ SOLN
0.3300 mL | INTRAMUSCULAR | Status: AC | PRN
  Administered 2023-09-06: .33 mL

## 2023-09-06 MED ORDER — LIDOCAINE HCL 1 % IJ SOLN
0.3000 mL | INTRAMUSCULAR | Status: AC | PRN
  Administered 2023-09-06: .3 mL
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
## 2023-09-07 ENCOUNTER — Ambulatory Visit: Payer: 59 | Admitting: Podiatry

## 2023-09-12 ENCOUNTER — Encounter: Payer: Self-pay | Admitting: Physical Medicine and Rehabilitation

## 2023-09-12 ENCOUNTER — Other Ambulatory Visit: Payer: Self-pay

## 2023-09-12 ENCOUNTER — Encounter: Payer: 59 | Attending: Physical Medicine and Rehabilitation | Admitting: Physical Medicine and Rehabilitation

## 2023-09-12 ENCOUNTER — Other Ambulatory Visit (HOSPITAL_COMMUNITY): Payer: Self-pay

## 2023-09-12 VITALS — BP 130/85 | HR 98 | Ht 63.0 in

## 2023-09-12 DIAGNOSIS — G8220 Paraplegia, unspecified: Secondary | ICD-10-CM

## 2023-09-12 DIAGNOSIS — Z79899 Other long term (current) drug therapy: Secondary | ICD-10-CM

## 2023-09-12 DIAGNOSIS — R252 Cramp and spasm: Secondary | ICD-10-CM | POA: Diagnosis present

## 2023-09-12 DIAGNOSIS — G894 Chronic pain syndrome: Secondary | ICD-10-CM | POA: Diagnosis not present

## 2023-09-12 DIAGNOSIS — Z5181 Encounter for therapeutic drug level monitoring: Secondary | ICD-10-CM

## 2023-09-12 DIAGNOSIS — Z993 Dependence on wheelchair: Secondary | ICD-10-CM

## 2023-09-12 DIAGNOSIS — N319 Neuromuscular dysfunction of bladder, unspecified: Secondary | ICD-10-CM | POA: Diagnosis present

## 2023-09-12 MED ORDER — OXYCODONE-ACETAMINOPHEN 10-325 MG PO TABS
1.0000 | ORAL_TABLET | Freq: Three times a day (TID) | ORAL | 0 refills | Status: DC | PRN
  Filled 2023-09-12 – 2023-10-13 (×2): qty 90, 30d supply, fill #0

## 2023-09-12 MED ORDER — OXYCODONE-ACETAMINOPHEN 10-325 MG PO TABS
1.0000 | ORAL_TABLET | Freq: Three times a day (TID) | ORAL | 0 refills | Status: DC | PRN
  Filled 2023-09-12: qty 90, 30d supply, fill #0
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
## 2023-09-12 NOTE — Patient Instructions (Addendum)
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  Here for  f/u- was discharged from [facility] 11/04/21.  F/u for paraplegia   Oral drug screen done today- due to clinic policy from opiate policy.    2.  Suggest asking Urology to discuss Botox of bladder, since bladder volumes are getting smaller- and that's due to bladder spasticity- and bladder functionally getting smaller so volumes it holds is less-  so that's why would be having bladder incontinence in between- so doing Botox can really help. Can last 9-12 months and can relax bladder so doesn't need to cath so often;  using a cystoscope- to botox bladder muscle- since you are a complete SCI patient, you CAN have done in office but can also be done in OR. Appt 12/12   3. Suggest changing caths to every 4 hours- so 6-7 caths/day- so 180-210 caths/month at least until you are getting Botox, if that's what you want. Please ask Urology to increase catheter amounts when you see them.    4. Con't baclofen 10 mg 3x/day- last refill 06/10/23.    5.  Gave patient some 14 french catheters as well catheters cathing kits- with lube, collection bag, etc.    6. F/U in 2 months with Riley Lam- single appt-chronic pain and 4 months with me- double appt- SCI   7. Increased cardiovascular risk is higher - length of time since injury is more indicative of  cardiovascular risk than age- so needs to see Cardiology about this- the amount of time since injury- being 35 years since injury - added to injury above T6- really increases your of stroke and heart attack. Is on Cholesterol lowing medicine-   8. Plans on getting bone density testing as well important to know, since has increased risk of fractures. Speed increases risk of fracture dramatically.

## 2023-09-13 ENCOUNTER — Other Ambulatory Visit: Payer: Self-pay

## 2023-09-13 ENCOUNTER — Other Ambulatory Visit (HOSPITAL_COMMUNITY): Payer: Self-pay

## 2023-09-14 ENCOUNTER — Ambulatory Visit: Payer: 59 | Admitting: Podiatry

## 2023-09-18 LAB — DRUG TOX MONITOR 1 W/CONF, ORAL FLD
Amphetamines: NEGATIVE ng/mL (ref <10)
Barbiturates: NEGATIVE ng/mL (ref <10)
Benzodiazepines: NEGATIVE ng/mL (ref <0.50)
Buprenorphine: NEGATIVE ng/mL (ref <0.10)
Cocaine: NEGATIVE ng/mL (ref <5.0)
Codeine: NEGATIVE ng/mL (ref <2.5)
Dihydrocodeine: NEGATIVE ng/mL (ref <2.5)
Fentanyl: NEGATIVE ng/mL (ref <0.10)
Hydrocodone: NEGATIVE ng/mL (ref <2.5)
Hydromorphone: NEGATIVE ng/mL (ref <2.5)
MARIJUANA: NEGATIVE ng/mL (ref <2.5)
MDMA: NEGATIVE ng/mL (ref <10)
Meprobamate: NEGATIVE ng/mL (ref <2.5)
Methadone: NEGATIVE ng/mL (ref <5.0)
Morphine: NEGATIVE ng/mL (ref <2.5)
Nicotine Metabolite: NEGATIVE ng/mL (ref <5.0)
Norhydrocodone: NEGATIVE ng/mL (ref <2.5)
Noroxycodone: 11.8 ng/mL — ABNORMAL HIGH (ref <2.5)
Opiates: POSITIVE ng/mL — AB (ref <2.5)
Oxycodone: >250 ng/mL — ABNORMAL HIGH (ref <2.5)
Oxymorphone: NEGATIVE ng/mL (ref <2.5)
Phencyclidine: NEGATIVE ng/mL (ref <10)
Tapentadol: NEGATIVE ng/mL (ref <5.0)
Tramadol: NEGATIVE ng/mL (ref <5.0)
Zolpidem: NEGATIVE ng/mL (ref <5.0)

## 2023-09-18 LAB — DRUG TOX ALC METAB W/CON, ORAL FLD: Alcohol Metabolite: NEGATIVE ng/mL (ref <25)

## 2023-09-19 ENCOUNTER — Other Ambulatory Visit (HOSPITAL_COMMUNITY): Payer: Self-pay

## 2023-09-19 ENCOUNTER — Other Ambulatory Visit: Payer: Self-pay

## 2023-09-19 NOTE — Progress Notes (Signed)
Specialty Pharmacy Refill Coordination Note  Ellen Lane is a 57 y.o. female contacted today regarding refills of specialty medication(s) Bictegravir-Emtricitab-Tenofov   Patient requested Delivery   Delivery date: 09/27/23   Verified address: [address] [place] [address]   Medication will be filled on 09/26/23.

## 2023-09-20 ENCOUNTER — Other Ambulatory Visit (HOSPITAL_COMMUNITY): Payer: Self-pay

## 2023-09-21 ENCOUNTER — Other Ambulatory Visit (HOSPITAL_COMMUNITY): Payer: Self-pay

## 2023-09-26 ENCOUNTER — Other Ambulatory Visit (HOSPITAL_COMMUNITY): Payer: Self-pay

## 2023-09-26 ENCOUNTER — Ambulatory Visit (INDEPENDENT_AMBULATORY_CARE_PROVIDER_SITE_OTHER): Payer: 59 | Admitting: Podiatry

## 2023-09-26 ENCOUNTER — Encounter: Payer: Self-pay | Admitting: Podiatry

## 2023-09-26 ENCOUNTER — Other Ambulatory Visit: Payer: Self-pay

## 2023-09-26 DIAGNOSIS — M79675 Pain in left toe(s): Secondary | ICD-10-CM

## 2023-09-26 DIAGNOSIS — B351 Tinea unguium: Secondary | ICD-10-CM | POA: Diagnosis not present

## 2023-09-26 DIAGNOSIS — G8220 Paraplegia, unspecified: Secondary | ICD-10-CM | POA: Diagnosis not present

## 2023-09-26 DIAGNOSIS — E1142 Type 2 diabetes mellitus with diabetic polyneuropathy: Secondary | ICD-10-CM

## 2023-09-26 DIAGNOSIS — M79674 Pain in right toe(s): Secondary | ICD-10-CM

## 2023-09-26 NOTE — Progress Notes (Signed)
This patient returns to my office for at risk foot care.  This patient requires this care by a professional since this patient will be at risk due to having diabetes and paraplegia. This patient is unable to cut nails himself since the patient cannot reach his nails.These nails are painful walking and wearing shoes.  This patient presents for at risk foot care today.  General Appearance  Alert, conversant and in no acute stress.  Vascular  Dorsalis pedis and posterior tibial  pulses are palpable  bilaterally.  Capillary return is within normal limits  bilaterally. Temperature is within normal limits  bilaterally.  Neurologic  Senn-Weinstein monofilament wire test within normal limits  bilaterally. Muscle power within normal limits bilaterally.  Nails Thick disfigured discolored nails with subungual debris  hallux toenails bilaterally. No evidence of bacterial infection or drainage bilaterally.  Orthopedic  No limitations of motion  feet .  No crepitus or effusions noted.  No bony pathology or digital deformities noted.  Skin  normotropic skin with no porokeratosis noted bilaterally.  No signs of infections or ulcers noted.     Onychomycosis  Pain in right toes  Pain in left toes  Consent was obtained for treatment procedures.   Mechanical debridement of nails 1-5  bilaterally performed with a nail nipper.  Filed with dremel without incident.    Return office visit    3 months                   Told patient to return for periodic foot care and evaluation due to potential at risk complications.   Helane Gunther DPM

## 2023-09-27 ENCOUNTER — Other Ambulatory Visit (HOSPITAL_COMMUNITY): Payer: Self-pay

## 2023-09-29 ENCOUNTER — Other Ambulatory Visit: Payer: Self-pay

## 2023-09-29 ENCOUNTER — Other Ambulatory Visit (HOSPITAL_COMMUNITY): Payer: Self-pay

## 2023-09-29 MED ORDER — OXYBUTYNIN CHLORIDE 5 MG PO TABS
5.0000 mg | ORAL_TABLET | Freq: Three times a day (TID) | ORAL | 3 refills | Status: AC
  Filled 2023-09-29: qty 270, 90d supply, fill #0
  Filled 2024-01-27: qty 270, 90d supply, fill #1
  Filled 2024-05-01: qty 270, 90d supply, fill #2
  Filled 2024-08-06: qty 270, 90d supply, fill #3

## 2023-10-03 ENCOUNTER — Other Ambulatory Visit (HOSPITAL_COMMUNITY): Payer: Self-pay

## 2023-10-13 ENCOUNTER — Other Ambulatory Visit (HOSPITAL_COMMUNITY): Payer: Self-pay

## 2023-10-13 ENCOUNTER — Other Ambulatory Visit: Payer: Self-pay

## 2023-10-17 ENCOUNTER — Other Ambulatory Visit (HOSPITAL_COMMUNITY): Payer: Self-pay

## 2023-10-20 ENCOUNTER — Other Ambulatory Visit (HOSPITAL_COMMUNITY): Payer: Self-pay

## 2023-10-20 ENCOUNTER — Other Ambulatory Visit: Payer: Self-pay

## 2023-10-20 ENCOUNTER — Other Ambulatory Visit (HOSPITAL_COMMUNITY): Payer: Self-pay | Admitting: Pharmacy Technician

## 2023-10-20 NOTE — Progress Notes (Signed)
 Specialty Pharmacy Refill Coordination Note  Doree Shaida Route is a 58 y.o. female contacted today regarding refills of specialty medication(s) Bictegravir-Emtricitab-Tenofov (Biktarvy )   Patient requested Delivery   Delivery date: 10/21/23   Verified address: [address]  [place]   Medication will be filled on 10/20/23.

## 2023-10-24 ENCOUNTER — Other Ambulatory Visit: Payer: Self-pay

## 2023-10-25 ENCOUNTER — Other Ambulatory Visit (HOSPITAL_COMMUNITY): Payer: Self-pay

## 2023-10-25 MED ORDER — OZEMPIC (2 MG/DOSE) 8 MG/3ML ~~LOC~~ SOPN
2.0000 mg | PEN_INJECTOR | SUBCUTANEOUS | 5 refills | Status: DC
  Filled 2023-10-25: qty 3, 28d supply, fill #0
  Filled 2023-11-18: qty 3, 28d supply, fill #1
  Filled 2023-12-02: qty 3, 28d supply, fill #2
  Filled 2024-01-03: qty 3, 28d supply, fill #3
  Filled 2024-01-31: qty 3, 28d supply, fill #4
  Filled 2024-02-27: qty 3, 28d supply, fill #5

## 2023-10-26 ENCOUNTER — Other Ambulatory Visit (HOSPITAL_COMMUNITY): Payer: Self-pay

## 2023-11-03 ENCOUNTER — Other Ambulatory Visit (HOSPITAL_COMMUNITY): Payer: Self-pay

## 2023-11-03 ENCOUNTER — Other Ambulatory Visit: Payer: Self-pay

## 2023-11-09 ENCOUNTER — Encounter: Payer: 59 | Admitting: Registered Nurse

## 2023-11-11 ENCOUNTER — Other Ambulatory Visit (HOSPITAL_COMMUNITY): Payer: Self-pay

## 2023-11-14 ENCOUNTER — Other Ambulatory Visit (HOSPITAL_COMMUNITY): Payer: Self-pay

## 2023-11-14 ENCOUNTER — Encounter (HOSPITAL_COMMUNITY): Payer: Self-pay

## 2023-11-16 ENCOUNTER — Other Ambulatory Visit (HOSPITAL_COMMUNITY): Payer: Self-pay

## 2023-11-16 ENCOUNTER — Ambulatory Visit: Payer: 59 | Admitting: Registered Nurse

## 2023-11-17 ENCOUNTER — Other Ambulatory Visit (HOSPITAL_COMMUNITY): Payer: Self-pay

## 2023-11-17 ENCOUNTER — Other Ambulatory Visit: Payer: Self-pay

## 2023-11-17 ENCOUNTER — Telehealth: Payer: Self-pay | Admitting: Registered Nurse

## 2023-11-17 NOTE — Telephone Encounter (Signed)
Patient's wheelchair battery is not working properly. She had to resch until after 11/29/23. She needs refill on Oxycodone.

## 2023-11-17 NOTE — Progress Notes (Signed)
Specialty Pharmacy Refill Coordination Note  Ellen Lane is a 58 y.o. female contacted today regarding refills of specialty medication(s) Bictegravir-Emtricitab-Tenofov [facility])   Patient requested Delivery   Delivery date: 11/22/23   Verified address: [address] [place] [address]   Medication will be filled on 11/21/23.

## 2023-11-18 ENCOUNTER — Encounter: Payer: 59 | Admitting: Registered Nurse

## 2023-11-18 ENCOUNTER — Other Ambulatory Visit: Payer: Self-pay

## 2023-11-18 ENCOUNTER — Other Ambulatory Visit (HOSPITAL_COMMUNITY): Payer: Self-pay

## 2023-11-18 ENCOUNTER — Other Ambulatory Visit: Payer: Self-pay | Admitting: Physical Medicine and Rehabilitation

## 2023-11-18 MED ORDER — OXYCODONE-ACETAMINOPHEN 10-325 MG PO TABS
1.0000 | ORAL_TABLET | Freq: Three times a day (TID) | ORAL | 0 refills | Status: DC | PRN
  Filled 2023-11-18: qty 90, 30d supply, fill #0

## 2023-11-18 NOTE — Telephone Encounter (Signed)
PMP was Reviewed.  Oxycodone e-scribed to pharmacy. Call placed to Ms. Racz regarding the above, she verbalizes understanding.

## 2023-11-19 ENCOUNTER — Other Ambulatory Visit (HOSPITAL_COMMUNITY): Payer: Self-pay

## 2023-11-21 ENCOUNTER — Other Ambulatory Visit: Payer: Self-pay

## 2023-12-02 ENCOUNTER — Other Ambulatory Visit (HOSPITAL_COMMUNITY): Payer: Self-pay

## 2023-12-06 ENCOUNTER — Other Ambulatory Visit: Payer: Self-pay

## 2023-12-07 ENCOUNTER — Encounter: Payer: Self-pay | Admitting: Physical Medicine and Rehabilitation

## 2023-12-07 ENCOUNTER — Encounter: Payer: 59 | Admitting: Registered Nurse

## 2023-12-07 ENCOUNTER — Other Ambulatory Visit (HOSPITAL_COMMUNITY): Payer: Self-pay

## 2023-12-07 ENCOUNTER — Encounter: Payer: 59 | Attending: Physical Medicine and Rehabilitation | Admitting: Physical Medicine and Rehabilitation

## 2023-12-07 DIAGNOSIS — R252 Cramp and spasm: Secondary | ICD-10-CM | POA: Diagnosis not present

## 2023-12-07 DIAGNOSIS — Z993 Dependence on wheelchair: Secondary | ICD-10-CM

## 2023-12-07 DIAGNOSIS — B20 Human immunodeficiency virus [HIV] disease: Secondary | ICD-10-CM

## 2023-12-07 DIAGNOSIS — G8921 Chronic pain due to trauma: Secondary | ICD-10-CM | POA: Diagnosis not present

## 2023-12-07 DIAGNOSIS — G8220 Paraplegia, unspecified: Secondary | ICD-10-CM | POA: Diagnosis not present

## 2023-12-07 DIAGNOSIS — L89309 Pressure ulcer of unspecified buttock, unspecified stage: Secondary | ICD-10-CM

## 2023-12-07 MED ORDER — OXYCODONE-ACETAMINOPHEN 10-325 MG PO TABS
1.0000 | ORAL_TABLET | Freq: Three times a day (TID) | ORAL | 0 refills | Status: DC | PRN
  Filled 2023-12-07 – 2023-12-17 (×2): qty 90, 30d supply, fill #0

## 2023-12-07 MED ORDER — BACLOFEN 10 MG PO TABS
10.0000 mg | ORAL_TABLET | Freq: Three times a day (TID) | ORAL | 1 refills | Status: DC
  Filled 2023-12-07 – 2023-12-29 (×2): qty 270, 90d supply, fill #0
  Filled 2024-04-03: qty 270, 90d supply, fill #1

## 2023-12-07 MED ORDER — OXYCODONE-ACETAMINOPHEN 10-325 MG PO TABS
1.0000 | ORAL_TABLET | Freq: Three times a day (TID) | ORAL | 0 refills | Status: DC | PRN
  Filled 2023-12-07: qty 90, 30d supply, fill #0
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
## 2023-12-07 NOTE — Patient Instructions (Signed)
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  Here for  f/u- was discharged from [facility] 11/04/21.  F/u for paraplegia   Last drug screen- oral was 09/12/23- so not due today per clinic policy.   2. Refilled Baclofen 10 mg 3x/day- sent in 3 months supply with 1 refill- 6 months supply  3. Will refill Pain meds- Percocet 10/325 mg up to 3x/day as needed # 90- and sent in 2 refills- 1 as of 3/1 and one 3/29- based on refill on 1/31.   4. We discussed wound care- has 2 new wounds on buttocks- on back of thighs- will see wound care next week- trying to do pressure relief as much as possible to prevent from getting worse. Doesn't need referral- already has appt.   5. Bladder not leaking anymore- so wait on Botox.    6. Going to Cards about possible Afib- and will get heart monitor-    7.  F/U in 2months- with me- double appt- SCI

## 2023-12-12 ENCOUNTER — Other Ambulatory Visit (HOSPITAL_COMMUNITY): Payer: Self-pay

## 2023-12-14 ENCOUNTER — Other Ambulatory Visit: Payer: Self-pay

## 2023-12-16 ENCOUNTER — Other Ambulatory Visit (HOSPITAL_COMMUNITY): Payer: Self-pay

## 2023-12-17 ENCOUNTER — Other Ambulatory Visit (HOSPITAL_COMMUNITY): Payer: Self-pay

## 2023-12-19 ENCOUNTER — Other Ambulatory Visit (HOSPITAL_COMMUNITY): Payer: Self-pay

## 2023-12-19 ENCOUNTER — Other Ambulatory Visit: Payer: Self-pay

## 2023-12-19 NOTE — Progress Notes (Signed)
 Specialty Pharmacy Refill Coordination Note  Ellen Lane is a 58 y.o. female contacted today regarding refills of specialty medication(s) Bictegravir-Emtricitab-Tenofov Musician)   Patient requested Delivery   Delivery date: 12/21/23   Verified address: [address]  [place] [address]   Medication will be filled on 12/20/23.

## 2023-12-20 ENCOUNTER — Other Ambulatory Visit (HOSPITAL_COMMUNITY): Payer: Self-pay

## 2023-12-21 ENCOUNTER — Other Ambulatory Visit (HOSPITAL_COMMUNITY): Payer: Self-pay

## 2023-12-21 MED ORDER — FUROSEMIDE 20 MG PO TABS
20.0000 mg | ORAL_TABLET | Freq: Every day | ORAL | 5 refills | Status: DC
  Filled 2023-12-21: qty 30, 30d supply, fill #0
  Filled 2024-01-20: qty 30, 30d supply, fill #1
  Filled 2024-06-01: qty 30, 30d supply, fill #2
  Filled 2024-07-31: qty 30, 30d supply, fill #3
  Filled 2024-09-03: qty 30, 30d supply, fill #4
  Filled 2024-09-28: qty 30, 30d supply, fill #5

## 2023-12-21 MED ORDER — MIDODRINE HCL 10 MG PO TABS
10.0000 mg | ORAL_TABLET | Freq: Three times a day (TID) | ORAL | 0 refills | Status: DC
  Filled 2023-12-21: qty 90, 30d supply, fill #0

## 2023-12-22 ENCOUNTER — Other Ambulatory Visit: Payer: Self-pay

## 2023-12-28 ENCOUNTER — Ambulatory Visit: Payer: 59 | Admitting: Podiatry

## 2023-12-29 ENCOUNTER — Other Ambulatory Visit (HOSPITAL_COMMUNITY): Payer: Self-pay

## 2024-01-03 ENCOUNTER — Other Ambulatory Visit (HOSPITAL_COMMUNITY): Payer: Self-pay

## 2024-01-05 ENCOUNTER — Other Ambulatory Visit: Payer: Self-pay

## 2024-01-05 ENCOUNTER — Ambulatory Visit (HOSPITAL_BASED_OUTPATIENT_CLINIC_OR_DEPARTMENT_OTHER): Payer: 59 | Admitting: Internal Medicine

## 2024-01-05 ENCOUNTER — Other Ambulatory Visit (HOSPITAL_COMMUNITY): Payer: Self-pay

## 2024-01-05 NOTE — Progress Notes (Signed)
 Specialty Pharmacy Refill Coordination Note  Ellen Lane is a 58 y.o. female contacted today regarding refills of specialty medication(s) No data recorded  Patient requested (Patient-Rptd) Delivery   Delivery date: (Patient-Rptd) 12/07/[birth year]   Verified address: (Patient-Rptd) [address] [place], [place] [address]   Medication will be filled on 01/11/24. New delivery date is 01/12/24. Patient has been notified.

## 2024-01-11 ENCOUNTER — Other Ambulatory Visit: Payer: Self-pay

## 2024-01-11 ENCOUNTER — Ambulatory Visit: Admitting: Podiatry

## 2024-01-13 ENCOUNTER — Ambulatory Visit: Admitting: Registered Nurse

## 2024-01-13 ENCOUNTER — Encounter: Payer: 59 | Admitting: Physical Medicine and Rehabilitation

## 2024-01-20 ENCOUNTER — Other Ambulatory Visit (HOSPITAL_BASED_OUTPATIENT_CLINIC_OR_DEPARTMENT_OTHER): Payer: Self-pay

## 2024-01-20 ENCOUNTER — Other Ambulatory Visit (HOSPITAL_COMMUNITY): Payer: Self-pay
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
## 2024-01-27 ENCOUNTER — Other Ambulatory Visit (HOSPITAL_COMMUNITY): Payer: Self-pay

## 2024-01-27 ENCOUNTER — Other Ambulatory Visit: Payer: Self-pay

## 2024-01-27 ENCOUNTER — Encounter: Attending: Physical Medicine and Rehabilitation | Admitting: Registered Nurse

## 2024-01-27 DIAGNOSIS — Z5181 Encounter for therapeutic drug level monitoring: Secondary | ICD-10-CM | POA: Diagnosis present

## 2024-01-27 DIAGNOSIS — G894 Chronic pain syndrome: Secondary | ICD-10-CM

## 2024-01-27 DIAGNOSIS — Z79891 Long term (current) use of opiate analgesic: Secondary | ICD-10-CM

## 2024-01-27 DIAGNOSIS — N319 Neuromuscular dysfunction of bladder, unspecified: Secondary | ICD-10-CM

## 2024-01-27 DIAGNOSIS — G8921 Chronic pain due to trauma: Secondary | ICD-10-CM | POA: Diagnosis not present

## 2024-01-27 DIAGNOSIS — G8220 Paraplegia, unspecified: Secondary | ICD-10-CM | POA: Diagnosis not present

## 2024-01-27 DIAGNOSIS — Z993 Dependence on wheelchair: Secondary | ICD-10-CM

## 2024-01-27 DIAGNOSIS — K592 Neurogenic bowel, not elsewhere classified: Secondary | ICD-10-CM

## 2024-01-27 DIAGNOSIS — L89309 Pressure ulcer of unspecified buttock, unspecified stage: Secondary | ICD-10-CM

## 2024-01-27 DIAGNOSIS — R252 Cramp and spasm: Secondary | ICD-10-CM | POA: Diagnosis not present

## 2024-01-27 MED ORDER — OXYCODONE-ACETAMINOPHEN 10-325 MG PO TABS
1.0000 | ORAL_TABLET | Freq: Three times a day (TID) | ORAL | 0 refills | Status: DC | PRN
  Filled 2024-01-27 – 2024-02-24 (×2): qty 90, 30d supply, fill #0

## 2024-01-27 MED ORDER — OXYCODONE-ACETAMINOPHEN 10-325 MG PO TABS
1.0000 | ORAL_TABLET | Freq: Three times a day (TID) | ORAL | 0 refills | Status: DC | PRN
  Filled 2024-01-27: qty 90, 30d supply, fill #0

## 2024-01-31 ENCOUNTER — Other Ambulatory Visit (HOSPITAL_COMMUNITY): Payer: Self-pay

## 2024-01-31 LAB — DRUG TOX MONITOR 1 W/CONF, ORAL FLD
Amphetamines: NEGATIVE ng/mL (ref <10)
Barbiturates: NEGATIVE ng/mL (ref <10)
Benzodiazepines: NEGATIVE ng/mL (ref <0.50)
Buprenorphine: NEGATIVE ng/mL (ref <0.10)
Cocaine: NEGATIVE ng/mL (ref <5.0)
Codeine: NEGATIVE ng/mL (ref <2.5)
Dihydrocodeine: NEGATIVE ng/mL (ref <2.5)
Fentanyl: NEGATIVE ng/mL (ref <0.10)
Hydrocodone: NEGATIVE ng/mL (ref <2.5)
Hydromorphone: NEGATIVE ng/mL (ref <2.5)
MARIJUANA: NEGATIVE ng/mL (ref <2.5)
MDMA: NEGATIVE ng/mL (ref <10)
Meprobamate: NEGATIVE ng/mL (ref <2.5)
Methadone: NEGATIVE ng/mL (ref <5.0)
Morphine: NEGATIVE ng/mL (ref <2.5)
Nicotine Metabolite: NEGATIVE ng/mL (ref <5.0)
Norhydrocodone: NEGATIVE ng/mL (ref <2.5)
Noroxycodone: 7.9 ng/mL — ABNORMAL HIGH (ref <2.5)
Opiates: POSITIVE ng/mL — AB (ref <2.5)
Oxycodone: >250 ng/mL — ABNORMAL HIGH (ref <2.5)
Oxymorphone: NEGATIVE ng/mL (ref <2.5)
Phencyclidine: NEGATIVE ng/mL (ref <10)
Tapentadol: NEGATIVE ng/mL (ref <5.0)
Tramadol: NEGATIVE ng/mL (ref <5.0)
Zolpidem: NEGATIVE ng/mL (ref <5.0)

## 2024-01-31 LAB — DRUG TOX ALC METAB W/CON, ORAL FLD: Alcohol Metabolite: NEGATIVE ng/mL (ref <25)

## 2024-02-07 ENCOUNTER — Other Ambulatory Visit (HOSPITAL_COMMUNITY): Payer: Self-pay

## 2024-02-08 ENCOUNTER — Other Ambulatory Visit (HOSPITAL_COMMUNITY): Payer: Self-pay

## 2024-02-08 ENCOUNTER — Ambulatory Visit (INDEPENDENT_AMBULATORY_CARE_PROVIDER_SITE_OTHER): Admitting: Podiatry

## 2024-02-08 ENCOUNTER — Other Ambulatory Visit: Payer: Self-pay

## 2024-02-08 DIAGNOSIS — Z91199 Patient's noncompliance with other medical treatment and regimen due to unspecified reason: Secondary | ICD-10-CM

## 2024-02-08 MED ORDER — COLCHICINE 0.6 MG PO TABS
0.6000 mg | ORAL_TABLET | Freq: Every day | ORAL | 2 refills | Status: DC
  Filled 2024-02-08 (×2): qty 30, 30d supply, fill #0
  Filled 2024-03-09: qty 30, 30d supply, fill #1
  Filled 2024-04-13: qty 30, 30d supply, fill #2

## 2024-02-08 MED ORDER — FUROSEMIDE 20 MG PO TABS
20.0000 mg | ORAL_TABLET | Freq: Every day | ORAL | 5 refills | Status: DC
  Filled 2024-02-08 – 2024-02-13 (×2): qty 30, 30d supply, fill #0
  Filled 2024-03-19: qty 30, 30d supply, fill #1
  Filled 2024-05-01: qty 30, 30d supply, fill #2

## 2024-02-08 NOTE — Progress Notes (Signed)
 No show

## 2024-02-09 ENCOUNTER — Other Ambulatory Visit (HOSPITAL_COMMUNITY): Payer: Self-pay

## 2024-02-09 ENCOUNTER — Other Ambulatory Visit: Payer: Self-pay

## 2024-02-09 NOTE — Progress Notes (Signed)
 Specialty Pharmacy Refill Coordination Note  Ellen Lane is a 58 y.o. female contacted today regarding refills of specialty medication(s) Bictegravir-Emtricitab-Tenofov (Biktarvy )   Patient requested Delivery   Delivery date: 02/10/24   Verified address: [address] [place], [place] [address]   Medication will be filled on 02/09/24.

## 2024-02-09 NOTE — Progress Notes (Signed)
 Specialty Pharmacy Ongoing Clinical Assessment Note  Ellen Lane is a 58 y.o. female who is being followed by the specialty pharmacy service for RxSp HIV   Patient's specialty medication(s) reviewed today: Bictegravir-Emtricitab-Tenofov (Biktarvy )   Missed doses in the last 4 weeks: 0   Patient/Caregiver did not have any additional questions or concerns.   Therapeutic benefit summary: Patient is achieving benefit   Adverse events/side effects summary: No adverse events/side effects   Patient's therapy is appropriate to: Continue    Goals Addressed             This Visit's Progress    Achieve Undetectable HIV Viral Load < 20   On track    Patient is on track. Patient will maintain adherence      Comply with lab assessments   On track    Patient is on track. Patient will maintain adherence      Increase CD4 count until steady state   On track    Patient is on track. Patient will maintain adherence      Maintain optimal adherence to therapy   On track    Patient is on track. Patient will maintain adherence         Follow up:  6 months  [place] Blizzard M [name] Specialty Pharmacist

## 2024-02-13 ENCOUNTER — Other Ambulatory Visit (HOSPITAL_COMMUNITY): Payer: Self-pay

## 2024-02-13 ENCOUNTER — Other Ambulatory Visit: Payer: Self-pay

## 2024-02-15 ENCOUNTER — Ambulatory Visit: Payer: 59 | Admitting: Infectious Disease

## 2024-02-23 ENCOUNTER — Other Ambulatory Visit (HOSPITAL_COMMUNITY): Payer: Self-pay

## 2024-02-24 ENCOUNTER — Other Ambulatory Visit (HOSPITAL_COMMUNITY): Payer: Self-pay

## 2024-02-24 ENCOUNTER — Other Ambulatory Visit: Payer: Self-pay

## 2024-02-27 ENCOUNTER — Other Ambulatory Visit: Payer: Self-pay

## 2024-02-27 ENCOUNTER — Other Ambulatory Visit (HOSPITAL_COMMUNITY): Payer: Self-pay

## 2024-02-29 ENCOUNTER — Ambulatory Visit: Payer: 59 | Admitting: Infectious Disease

## 2024-02-29 ENCOUNTER — Other Ambulatory Visit (HOSPITAL_COMMUNITY): Payer: Self-pay

## 2024-02-29 MED ORDER — OZEMPIC (2 MG/DOSE) 8 MG/3ML ~~LOC~~ SOPN
2.0000 mg | PEN_INJECTOR | SUBCUTANEOUS | 5 refills | Status: DC
Start: 2024-02-29 — End: 2024-09-14
  Filled 2024-02-29 – 2024-03-28 (×2): qty 3, 28d supply, fill #0
  Filled 2024-04-23: qty 3, 28d supply, fill #1
  Filled 2024-05-21: qty 3, 28d supply, fill #2
  Filled 2024-06-25: qty 3, 28d supply, fill #3
  Filled 2024-07-17: qty 3, 28d supply, fill #4
  Filled 2024-08-16: qty 3, 28d supply, fill #5

## 2024-03-01 ENCOUNTER — Ambulatory Visit: Admitting: Infectious Disease

## 2024-03-01 ENCOUNTER — Other Ambulatory Visit: Payer: Self-pay

## 2024-03-01 ENCOUNTER — Other Ambulatory Visit: Payer: Self-pay | Admitting: Pharmacy Technician

## 2024-03-01 NOTE — Progress Notes (Signed)
 Specialty Pharmacy Refill Coordination Note  Ellen Lane is a 58 y.o. female contacted today regarding refills of specialty medication(s) Bictegravir-Emtricitab-Tenofov (Biktarvy )   Patient requested Delivery   Delivery date: 03/06/24   Verified address: [address], [place], [place] [address]   Medication will be filled on 03/05/24.

## 2024-03-05 ENCOUNTER — Other Ambulatory Visit (HOSPITAL_COMMUNITY): Payer: Self-pay

## 2024-03-05 NOTE — Progress Notes (Signed)
 The 10-year ASCVD risk score (Arnett DK, et al., 2019) is: 8.7%   Values used to calculate the score:     Age: 58 years     Sex: Female     Is Non-Hispanic African American: Yes     Diabetic: Yes     Tobacco smoker: No     Systolic Blood Pressure: 128 mmHg     Is BP treated: Yes     HDL Cholesterol: 57 mg/dL     Total Cholesterol: 142 mg/dL  Currently prescribed atorvastatin  20 mg.   Jericka Kadar, BSN, RN

## 2024-03-09 ENCOUNTER — Other Ambulatory Visit (HOSPITAL_COMMUNITY): Payer: Self-pay

## 2024-03-19 ENCOUNTER — Other Ambulatory Visit (HOSPITAL_COMMUNITY): Payer: Self-pay
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
## 2024-03-21 ENCOUNTER — Other Ambulatory Visit: Payer: Self-pay

## 2024-03-21 ENCOUNTER — Encounter: Payer: Self-pay | Admitting: Infectious Disease

## 2024-03-21 ENCOUNTER — Ambulatory Visit: Admitting: Infectious Disease

## 2024-03-21 ENCOUNTER — Other Ambulatory Visit (HOSPITAL_COMMUNITY): Payer: Self-pay

## 2024-03-21 VITALS — BP 100/71 | HR 101 | Temp 97.7°F

## 2024-03-21 DIAGNOSIS — B20 Human immunodeficiency virus [HIV] disease: Secondary | ICD-10-CM

## 2024-03-21 DIAGNOSIS — M4644 Discitis, unspecified, thoracic region: Secondary | ICD-10-CM | POA: Diagnosis not present

## 2024-03-21 DIAGNOSIS — E1142 Type 2 diabetes mellitus with diabetic polyneuropathy: Secondary | ICD-10-CM

## 2024-03-21 DIAGNOSIS — M4620 Osteomyelitis of vertebra, site unspecified: Secondary | ICD-10-CM

## 2024-03-21 DIAGNOSIS — T847XXD Infection and inflammatory reaction due to other internal orthopedic prosthetic devices, implants and grafts, subsequent encounter: Secondary | ICD-10-CM

## 2024-03-21 DIAGNOSIS — E785 Hyperlipidemia, unspecified: Secondary | ICD-10-CM

## 2024-03-21 DIAGNOSIS — M810 Age-related osteoporosis without current pathological fracture: Secondary | ICD-10-CM

## 2024-03-21 DIAGNOSIS — Z981 Arthrodesis status: Secondary | ICD-10-CM

## 2024-03-21 DIAGNOSIS — M4624 Osteomyelitis of vertebra, thoracic region: Secondary | ICD-10-CM

## 2024-03-21 DIAGNOSIS — G062 Extradural and subdural abscess, unspecified: Secondary | ICD-10-CM

## 2024-03-21 MED ORDER — CEFDINIR 300 MG PO CAPS
300.0000 mg | ORAL_CAPSULE | Freq: Two times a day (BID) | ORAL | 3 refills | Status: AC
  Filled 2024-03-21 – 2024-05-10 (×2): qty 180, 90d supply, fill #0
  Filled 2024-08-09: qty 180, 90d supply, fill #1
  Filled 2024-11-08: qty 180, 90d supply, fill #2

## 2024-03-21 MED ORDER — BIKTARVY 50-200-25 MG PO TABS
1.0000 | ORAL_TABLET | Freq: Every day | ORAL | 11 refills | Status: AC
  Filled 2024-03-21 – 2024-03-29 (×2): qty 30, 30d supply, fill #0
  Filled 2024-04-25 – 2024-05-01 (×2): qty 30, 30d supply, fill #1
  Filled 2024-05-28 – 2024-06-01 (×2): qty 30, 30d supply, fill #2
  Filled 2024-06-25 – 2024-06-28 (×2): qty 30, 30d supply, fill #3
  Filled 2024-07-24 – 2024-07-27 (×2): qty 30, 30d supply, fill #4
  Filled 2024-08-21 – 2024-08-29 (×2): qty 30, 30d supply, fill #5
  Filled 2024-09-21 – 2024-09-28 (×2): qty 30, 30d supply, fill #6
  Filled 2024-10-17 – 2024-10-22 (×2): qty 30, 30d supply, fill #7
  Filled 2024-11-16: qty 30, 30d supply, fill #8

## 2024-03-21 MED ORDER — DOXYCYCLINE HYCLATE 100 MG PO TABS
100.0000 mg | ORAL_TABLET | Freq: Two times a day (BID) | ORAL | 3 refills | Status: AC
  Filled 2024-03-21 – 2024-05-10 (×2): qty 180, 90d supply, fill #0
  Filled 2024-08-09: qty 180, 90d supply, fill #1
  Filled 2024-11-08: qty 180, 90d supply, fill #2

## 2024-03-21 MED ORDER — ATORVASTATIN CALCIUM 20 MG PO TABS
20.0000 mg | ORAL_TABLET | Freq: Every day | ORAL | 3 refills | Status: AC
  Filled 2024-03-21: qty 90, 90d supply, fill #0
  Filled 2024-06-28: qty 90, 90d supply, fill #1
  Filled 2024-10-05: qty 90, 90d supply, fill #2

## 2024-03-22 ENCOUNTER — Other Ambulatory Visit (HOSPITAL_COMMUNITY): Payer: Self-pay

## 2024-03-22 ENCOUNTER — Other Ambulatory Visit: Payer: Self-pay

## 2024-03-22 LAB — T-HELPER CELLS (CD4) COUNT (NOT AT ARMC)
CD4 % Helper T Cell: 41 % (ref 33–65)
CD4 T Cell Abs: 667 /uL (ref 400–1790)

## 2024-03-23 ENCOUNTER — Encounter: Payer: Self-pay | Admitting: Registered Nurse

## 2024-03-23 ENCOUNTER — Encounter: Attending: Physical Medicine and Rehabilitation | Admitting: Registered Nurse

## 2024-03-23 ENCOUNTER — Other Ambulatory Visit: Payer: Self-pay

## 2024-03-23 VITALS — BP 109/72 | HR 100 | Ht 63.0 in | Wt 140.0 lb

## 2024-03-23 DIAGNOSIS — Z993 Dependence on wheelchair: Secondary | ICD-10-CM | POA: Diagnosis not present

## 2024-03-23 DIAGNOSIS — L89309 Pressure ulcer of unspecified buttock, unspecified stage: Secondary | ICD-10-CM | POA: Diagnosis present

## 2024-03-23 DIAGNOSIS — Z79891 Long term (current) use of opiate analgesic: Secondary | ICD-10-CM | POA: Insufficient documentation

## 2024-03-23 DIAGNOSIS — G894 Chronic pain syndrome: Secondary | ICD-10-CM | POA: Insufficient documentation

## 2024-03-23 DIAGNOSIS — G8220 Paraplegia, unspecified: Secondary | ICD-10-CM | POA: Diagnosis not present

## 2024-03-23 DIAGNOSIS — N319 Neuromuscular dysfunction of bladder, unspecified: Secondary | ICD-10-CM | POA: Insufficient documentation

## 2024-03-23 DIAGNOSIS — Z5181 Encounter for therapeutic drug level monitoring: Secondary | ICD-10-CM | POA: Insufficient documentation

## 2024-03-23 DIAGNOSIS — R252 Cramp and spasm: Secondary | ICD-10-CM | POA: Insufficient documentation

## 2024-03-23 DIAGNOSIS — K592 Neurogenic bowel, not elsewhere classified: Secondary | ICD-10-CM | POA: Insufficient documentation

## 2024-03-23 DIAGNOSIS — G8921 Chronic pain due to trauma: Secondary | ICD-10-CM | POA: Diagnosis not present

## 2024-03-23 LAB — CBC WITH DIFFERENTIAL/PLATELET
Absolute Lymphocytes: 1846 {cells}/uL (ref 850–3900)
Absolute Monocytes: 270 {cells}/uL (ref 200–950)
Basophils Absolute: 20 {cells}/uL (ref 0–200)
Basophils Relative: 0.4 %
Eosinophils Absolute: 71 {cells}/uL (ref 15–500)
Eosinophils Relative: 1.4 %
HCT: 40.7 % (ref 35.0–45.0)
Hemoglobin: 12.5 g/dL (ref 11.7–15.5)
MCH: 25.9 pg — ABNORMAL LOW (ref 27.0–33.0)
MCHC: 30.7 g/dL — ABNORMAL LOW (ref 32.0–36.0)
MCV: 84.3 fL (ref 80.0–100.0)
MPV: 11.6 fL (ref 7.5–12.5)
Monocytes Relative: 5.3 %
Neutro Abs: 2892 {cells}/uL (ref 1500–7800)
Neutrophils Relative %: 56.7 %
Platelets: 224 10*3/uL (ref 140–400)
RBC: 4.83 10*6/uL (ref 3.80–5.10)
RDW: 15 % (ref 11.0–15.0)
Total Lymphocyte: 36.2 %
WBC: 5.1 10*3/uL (ref 3.8–10.8)

## 2024-03-23 LAB — COMPLETE METABOLIC PANEL WITHOUT GFR
AG Ratio: 1.2 (calc) (ref 1.0–2.5)
ALT: 43 U/L — ABNORMAL HIGH (ref 6–29)
AST: 25 U/L (ref 10–35)
Albumin: 4.1 g/dL (ref 3.6–5.1)
Alkaline phosphatase (APISO): 141 U/L (ref 37–153)
BUN/Creatinine Ratio: 19 (calc) (ref 6–22)
BUN: 7 mg/dL (ref 7–25)
CO2: 23 mmol/L (ref 20–32)
Calcium: 9.8 mg/dL (ref 8.6–10.4)
Chloride: 105 mmol/L (ref 98–110)
Creat: 0.37 mg/dL — ABNORMAL LOW (ref 0.50–1.03)
Globulin: 3.3 g/dL (ref 1.9–3.7)
Glucose, Bld: 82 mg/dL (ref 65–99)
Potassium: 3.5 mmol/L (ref 3.5–5.3)
Sodium: 139 mmol/L (ref 135–146)
Total Bilirubin: 0.6 mg/dL (ref 0.2–1.2)
Total Protein: 7.4 g/dL (ref 6.1–8.1)

## 2024-03-23 LAB — LIPID PANEL
Cholesterol: 148 mg/dL (ref <200)
HDL: 62 mg/dL (ref >=50)
LDL Cholesterol (Calc): 68 mg/dL
Non-HDL Cholesterol (Calc): 86 mg/dL (ref <130)
Total CHOL/HDL Ratio: 2.4 (calc) (ref <5.0)
Triglycerides: 93 mg/dL (ref <150)

## 2024-03-23 LAB — C-REACTIVE PROTEIN: CRP: <3 mg/L (ref <8.0)

## 2024-03-23 LAB — SEDIMENTATION RATE: Sed Rate: 48 mm/h — ABNORMAL HIGH (ref 0–30)

## 2024-03-23 LAB — HIV-1 RNA QUANT-NO REFLEX-BLD
HIV 1 RNA Quant: NOT DETECTED {copies}/mL
HIV-1 RNA Quant, Log: NOT DETECTED {Log_copies}/mL

## 2024-03-23 LAB — RPR: RPR Ser Ql: NONREACTIVE

## 2024-03-23 MED ORDER — OXYCODONE-ACETAMINOPHEN 10-325 MG PO TABS: 1.0000 | ORAL_TABLET | Freq: Three times a day (TID) | ORAL | 0 refills | Status: DC | PRN

## 2024-03-23 MED ORDER — OXYCODONE-ACETAMINOPHEN 10-325 MG PO TABS
1.0000 | ORAL_TABLET | Freq: Three times a day (TID) | ORAL | 0 refills | Status: DC | PRN
  Filled 2024-03-23: qty 90, 30d supply, fill #0
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
## 2024-03-28 ENCOUNTER — Other Ambulatory Visit: Payer: Self-pay

## 2024-03-28 ENCOUNTER — Other Ambulatory Visit (HOSPITAL_COMMUNITY): Payer: Self-pay

## 2024-03-28 ENCOUNTER — Ambulatory Visit: Admitting: Podiatry

## 2024-03-29 ENCOUNTER — Other Ambulatory Visit: Payer: Self-pay

## 2024-03-29 NOTE — Progress Notes (Signed)
 Specialty Pharmacy Refill Coordination Note  Ellen Lane is a 58 y.o. female contacted today regarding refills of specialty medication(s) Bictegravir-Emtricitab-Tenofov (Biktarvy )   Patient requested Delivery   Delivery date: 04/02/24   Verified address: [address] [place], [place] [address]   Medication will be filled on 03/30/24.

## 2024-04-02 ENCOUNTER — Other Ambulatory Visit (HOSPITAL_COMMUNITY): Payer: Self-pay

## 2024-04-02 ENCOUNTER — Other Ambulatory Visit: Payer: Self-pay

## 2024-04-02 MED ORDER — POTASSIUM CHLORIDE CRYS ER 20 MEQ PO TBCR
20.0000 meq | EXTENDED_RELEASE_TABLET | Freq: Every day | ORAL | 5 refills | Status: DC
  Filled 2024-04-02: qty 30, 30d supply, fill #0
  Filled 2024-05-01: qty 30, 30d supply, fill #1
  Filled 2024-06-01: qty 30, 30d supply, fill #2
  Filled 2024-06-28: qty 30, 30d supply, fill #3
  Filled 2024-07-31: qty 30, 30d supply, fill #4
  Filled 2024-08-30: qty 30, 30d supply, fill #5

## 2024-04-03 ENCOUNTER — Other Ambulatory Visit (HOSPITAL_COMMUNITY): Payer: Self-pay

## 2024-04-11 ENCOUNTER — Encounter: Payer: Self-pay | Admitting: Podiatry

## 2024-04-11 ENCOUNTER — Ambulatory Visit (INDEPENDENT_AMBULATORY_CARE_PROVIDER_SITE_OTHER): Admitting: Podiatry

## 2024-04-11 DIAGNOSIS — M79674 Pain in right toe(s): Secondary | ICD-10-CM | POA: Diagnosis not present

## 2024-04-11 DIAGNOSIS — E1142 Type 2 diabetes mellitus with diabetic polyneuropathy: Secondary | ICD-10-CM | POA: Diagnosis not present

## 2024-04-11 DIAGNOSIS — M79675 Pain in left toe(s): Secondary | ICD-10-CM | POA: Diagnosis not present

## 2024-04-11 DIAGNOSIS — B351 Tinea unguium: Secondary | ICD-10-CM | POA: Diagnosis not present

## 2024-04-11 DIAGNOSIS — E119 Type 2 diabetes mellitus without complications: Secondary | ICD-10-CM
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
## 2024-04-13 ENCOUNTER — Other Ambulatory Visit (HOSPITAL_COMMUNITY): Payer: Self-pay

## 2024-04-19 ENCOUNTER — Other Ambulatory Visit (HOSPITAL_COMMUNITY): Payer: Self-pay

## 2024-04-19 ENCOUNTER — Other Ambulatory Visit: Payer: Self-pay | Admitting: Registered Nurse

## 2024-04-19 MED ORDER — OXYCODONE-ACETAMINOPHEN 10-325 MG PO TABS
1.0000 | ORAL_TABLET | Freq: Three times a day (TID) | ORAL | 0 refills | Status: DC | PRN
  Filled 2024-04-19: qty 90, 30d supply, fill #0

## 2024-04-19 MED ORDER — OMEPRAZOLE 40 MG PO CPDR
40.0000 mg | DELAYED_RELEASE_CAPSULE | Freq: Every day | ORAL | 2 refills | Status: AC
  Filled 2024-04-19: qty 30, 30d supply, fill #0
  Filled 2024-06-30: qty 30, 30d supply, fill #1

## 2024-04-19 NOTE — Telephone Encounter (Addendum)
 There is an Rx at the pharmacy but it was sent on PRINT so it is not usable. Dr Lovorn can you send this in?

## 2024-04-21 ENCOUNTER — Other Ambulatory Visit (HOSPITAL_COMMUNITY): Payer: Self-pay

## 2024-04-23 ENCOUNTER — Other Ambulatory Visit (HOSPITAL_COMMUNITY): Payer: Self-pay

## 2024-04-23 ENCOUNTER — Other Ambulatory Visit: Payer: Self-pay

## 2024-04-23 MED ORDER — ONDANSETRON 4 MG PO TBDP
4.0000 mg | ORAL_TABLET | Freq: Three times a day (TID) | ORAL | 0 refills | Status: AC | PRN
  Filled 2024-04-23: qty 20, 7d supply, fill #0

## 2024-04-24 ENCOUNTER — Other Ambulatory Visit (HOSPITAL_COMMUNITY): Payer: Self-pay

## 2024-04-25 ENCOUNTER — Other Ambulatory Visit: Payer: Self-pay

## 2024-04-27 ENCOUNTER — Other Ambulatory Visit: Payer: Self-pay

## 2024-05-01 ENCOUNTER — Other Ambulatory Visit: Payer: Self-pay

## 2024-05-01 ENCOUNTER — Other Ambulatory Visit (HOSPITAL_COMMUNITY): Payer: Self-pay

## 2024-05-01 NOTE — Progress Notes (Signed)
 Specialty Pharmacy Refill Coordination Note  Ellen Lane is a 58 y.o. female contacted today regarding refills of specialty medication(s) Bictegravir-Emtricitab-Tenofov (Biktarvy )   Patient requested Delivery   Delivery date: 05/02/24   Verified address: [address], [place], [place] [address]   Medication will be filled on 05/01/24.

## 2024-05-10 ENCOUNTER — Other Ambulatory Visit (HOSPITAL_COMMUNITY): Payer: Self-pay

## 2024-05-10 ENCOUNTER — Other Ambulatory Visit: Payer: Self-pay

## 2024-05-18 ENCOUNTER — Encounter: Payer: Self-pay | Admitting: Physical Medicine and Rehabilitation

## 2024-05-18 ENCOUNTER — Encounter: Attending: Physical Medicine and Rehabilitation | Admitting: Physical Medicine and Rehabilitation

## 2024-05-18 ENCOUNTER — Other Ambulatory Visit (HOSPITAL_BASED_OUTPATIENT_CLINIC_OR_DEPARTMENT_OTHER): Payer: Self-pay

## 2024-05-18 VITALS — BP 100/69 | HR 93 | Ht 63.0 in

## 2024-05-18 DIAGNOSIS — M7918 Myalgia, other site: Secondary | ICD-10-CM | POA: Diagnosis not present

## 2024-05-18 DIAGNOSIS — R252 Cramp and spasm: Secondary | ICD-10-CM | POA: Diagnosis present

## 2024-05-18 DIAGNOSIS — G894 Chronic pain syndrome: Secondary | ICD-10-CM | POA: Insufficient documentation

## 2024-05-18 DIAGNOSIS — Z993 Dependence on wheelchair: Secondary | ICD-10-CM | POA: Insufficient documentation

## 2024-05-18 DIAGNOSIS — G8221 Paraplegia, complete: Secondary | ICD-10-CM | POA: Insufficient documentation

## 2024-05-18 MED ORDER — OXYCODONE-ACETAMINOPHEN 10-325 MG PO TABS
1.0000 | ORAL_TABLET | Freq: Three times a day (TID) | ORAL | 0 refills | Status: DC | PRN
  Filled 2024-05-18 – 2024-05-22 (×2): qty 90, 30d supply, fill #0

## 2024-05-18 MED ORDER — BACLOFEN 10 MG PO TABS
10.0000 mg | ORAL_TABLET | Freq: Three times a day (TID) | ORAL | 1 refills | Status: AC
  Filled 2024-05-18 – 2024-06-28 (×2): qty 270, 90d supply, fill #0
  Filled 2024-09-28: qty 270, 90d supply, fill #1

## 2024-05-18 MED ORDER — OXYCODONE-ACETAMINOPHEN 10-325 MG PO TABS
1.0000 | ORAL_TABLET | Freq: Three times a day (TID) | ORAL | 0 refills | Status: DC | PRN
  Filled 2024-05-18 – 2024-06-21 (×2): qty 90, 30d supply, fill #0

## 2024-05-18 NOTE — Patient Instructions (Addendum)
 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  Here for  f/u- was discharged from [facility] 11/04/21.  F/u for paraplegia   Standing frames-and standing w/c- don't work for osteoporosis.  I agree with being treated, but I'm not sure it will be effective.    2. Is due for new w/c-  current w/c is 58 years old- casters are worn, as well as back in bad condition- and having problems with motor/battery.    3. Meets criteria for power standing w/c (last w/c was received 01/2019)  due to : 1. is a T3 complete paraplegic-  and needs a power w/c- for locomotion, elevator to transfer in and out out w/c due to poor balance from T3 injury- uses transfer board but needs to go down hill every time she  transfers- also needs tilt in space  and recline due to in/out cathing q5 hours- from w/c and to do pressure relief- due to B/L buttock Stage III pressure ulcers;  2. Specifically  pt needs Standing power w/c since injury was 36 years ago 3. Pt has B/L buttock pressure ulcers stage III which would heal faster if was able to get weight off backside more regularly- and to help prevent pressure ulcers in future; also to help with bowel program- due to severe constipation, even with multiple PO meds- 4. Also needs to help with spasticity- her spasticity is controlled ok, but still has severe spasms due to chronic pain and pain from current pressure ulcers, which can spike spasticity up to unreal levels- getting a standing power w/c is much less expensive than getting an Intrathecal baclofen  pump  saving 50k$ for the pump and refills every 3 months $3-5K and doctors appointments every 2 weeks to titrate up the pump- having a standing w/c will reduce the cost of this treatment to just the standing w/c.    4.  Will give pt referral for W/C evaluation- will place for [facility] and let Selinda know with [facility] to do  w/c eval and get it scheduled- spoke with Selinda.   5.  Sent in Percocet 10/325 #120 for pain meds- 2 months supply with ICD 10 code M89.21.   6. F/U in 2 months with Fidela and 4 months with me- double appt- SCI  7. Last Oral drug screen- not due for oral drug screen today- last one 01/2024- per clinic policy.
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
## 2024-05-21 ENCOUNTER — Other Ambulatory Visit (HOSPITAL_COMMUNITY): Payer: Self-pay

## 2024-05-22 ENCOUNTER — Other Ambulatory Visit: Payer: Self-pay

## 2024-05-22 ENCOUNTER — Other Ambulatory Visit (HOSPITAL_COMMUNITY): Payer: Self-pay

## 2024-05-23 ENCOUNTER — Other Ambulatory Visit (HOSPITAL_COMMUNITY): Payer: Self-pay

## 2024-05-28 ENCOUNTER — Other Ambulatory Visit: Payer: Self-pay

## 2024-05-30 ENCOUNTER — Other Ambulatory Visit: Payer: Self-pay

## 2024-06-01 ENCOUNTER — Other Ambulatory Visit: Payer: Self-pay

## 2024-06-01 NOTE — Progress Notes (Signed)
 Specialty Pharmacy Refill Coordination Note  Ellen Lane is a 58 y.o. female contacted today regarding refills of specialty medication(s) Bictegravir-Emtricitab-Tenofov Musician)   Patient requested Delivery   Delivery date: 06/04/24   Verified address: [address], [place], [place] [address]   Medication will be filled on 06/01/24.   Adding OP meds potassium and furosemide.

## 2024-06-07 ENCOUNTER — Ambulatory Visit: Admitting: Obstetrics and Gynecology

## 2024-06-21 ENCOUNTER — Other Ambulatory Visit (HOSPITAL_COMMUNITY): Payer: Self-pay

## 2024-06-21 ENCOUNTER — Other Ambulatory Visit: Payer: Self-pay

## 2024-06-22 ENCOUNTER — Encounter: Payer: Self-pay | Admitting: Physical Therapy

## 2024-06-22 ENCOUNTER — Other Ambulatory Visit: Payer: Self-pay

## 2024-06-22 ENCOUNTER — Ambulatory Visit: Attending: Physical Medicine and Rehabilitation | Admitting: Physical Therapy

## 2024-06-22 DIAGNOSIS — R293 Abnormal posture: Secondary | ICD-10-CM | POA: Insufficient documentation

## 2024-06-22 DIAGNOSIS — R208 Other disturbances of skin sensation: Secondary | ICD-10-CM | POA: Insufficient documentation

## 2024-06-22 DIAGNOSIS — G8221 Paraplegia, complete: Secondary | ICD-10-CM | POA: Insufficient documentation

## 2024-06-22 DIAGNOSIS — R252 Cramp and spasm: Secondary | ICD-10-CM | POA: Diagnosis present

## 2024-06-22 NOTE — Therapy (Addendum)
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 of       MRADL(s)      Power elevating legrest    [x]  Center mount (Single) 85-170 degrees       []  Standard (Pair) 100-170 degrees  [x]  position legs at 90 degrees, not available with std power ELR  [x]  center mount tucks into chair to decrease turning radius in home, not available with std power ELR  [x]  provide change in position for LE  [x]  elevate legs during recline    [x]  maintain placement of feet on      footplate  []  decrease edema  [x]  improve circulation  [x]  actuator needed to elevate legrest  [x]  actuator needed to articulate legrest preventing knees from flexing  [x]  Increase ground clearance over      curbs  []   STD (pair) independently                     elevate legrest   POWER WHEELCHAIR CONTROLS      Controls/input device  [x]  Expandable  []  Non-expandable  [x]  Proportional  [x]  Right Hand []  Left Hand  []  Non-proportional/switches/head-array  []  Electrical/proximity         []   Mechanical      Manufacturer:___________________   Type:________________________ []  provides access for controlling wheelchair  []  programming for accurate  control  []  progressive disease/changing condition  []  required for alternative drive      controls       []  lacks motor control to operate  proportional drive control  []  unable to understand proportional controls  []  limited movement/strength  []  extraneous movement / tremors / ataxic / spastic       [x]  Upgraded electronics controller/harness    []  Single power (tilt or recline)   []  Expandable    []  Non-expandable plus   [x]  Multi-power (tilt, recline, power legrest, power seat lift, vertical positioning system, stand)  [x]  allows input device to communicate with drive motors  [x]  harness provides necessary connections between the controller, input device, and seat functions     [x]  needed in order to operate power seat functions through joystick/ input device  []  required for alternative drive controls     []   Enhanced display  []  required to connect all alternative drive controls   []  required for upgraded joystick      (lite-throw, heavy duty, micro)  []  Allows user to see in which mode and drive the wheelchair is set; necessary for alternate controls       []  Upgraded tracking electronics  []  correct tracking when on uneven surfaces makes switch driving more efficient and less fatiguing  []  increase safety when driving  []  increase ability to traverse thresholds    []  Safety / reset / mode switches     Type:    []  Used to change modes and stop the wheelchair when driving     [x]  Mount for joystick / input device/switches  [x]  swing away for access or transfers   [x]  attaches joystick / input device / switches to wheelchair   [x]  provides for consistent access  []  midline for optimal placement    []  Attendant controlled joystick plus     mount  []  safety  []  long distance driving  []  operation of seat functions  []  compliance with transportation regulations    [x]  Battery [x]  required to power (power assist / scooter/ power wc / other): power wc  []  Power inverter (24V to  12V)  []  required for ventilator / respiratory equipment / other:     CHAIR OPTIONS MANUAL & POWER      Armrests   [x]  adjustable height []  removable  []  swing away []  fixed  [x]  flip back  []  reclining  [x]  full length pads []  desk []  tube arms [x]  gel pads  [x]  provide support with elbow at 90    [x]  remove/flip back/swing away for  transfers  [x]  provide support and positioning of upper body    [x]  allow to come closer to table top  [x]  remove for access to tables  [x]  provide support for w/c tray  [x]  change of height/angles for variable activities   []  Elbow support / Elbow stop  []  keep elbow positioned on arm pad  []  keep arms from falling off arm pad  during tilt and/or recline   Upper Extremity Support  []  Arm trough  []   R  []   L  Style:  []  swivel mount []  fixed mount   []  posterior hand support  []   tray  [x]  full tray  []  joystick cut out  []   R  []   L  Style:  []  decrease gravitational pull on      shoulders  []  provide support to increase UE  function  [x]  provide hand support in natural    position  []  position flaccid UE  []  decrease subluxation    []  decrease edema       []  manage spasticity   [x]  provide midline positioning  [x]  provide work surface  []  placement for AAC/ Computer/ EADL       Hangers/ Legrests   []  ______ degree  []  Elevating []  articulating  []  swing away []  fixed []  lift off  []  heavy duty  []  adjustable knee angle  []  adjustable calf panel   []  longer extension tube              []  provide LE support  []  maintain placement of feet on      footplate   []  accommodate lower leg length  []  accommodate to hamstring       tightness  []  enable transfers  []  provide change in  position for LE's  []  elevate legs during recline    []  decrease edema  []  durability      Foot support   [x]  footplate [x]  R [x]  L [x]  flip up           []  Depth adjustable   [x]  angle adjustable  []  foot board/one piece    [x]  provide foot support  [x]   accommodate to ankle ROM  [x]  allow foot to go under wheelchair base  [x]  enable transfers     []  Shoe holders  []  position foot    []  decrease / manage spasticity  []  control position of LE  []  stability    []  safety     []  Ankle strap/heel      loops  []  support foot on foot support  []  decrease extraneous movement  []  provide input to heel   []  protect foot     []  Amputee adapter []  R  []  L     Style:                  Size:  []  Provide support for stump/residual extremity    []  Transportation tie-down  []  to provide crash tested tie-down brackets    []  Crutch/cane holder    []  O2 holder    []  IV hanger   []  Ventilator tray/mount    []  stabilize accessory on wheelchair       Component  Justification     [x]  Seat cushion     ROHO High Profile Single Valve [x]  accommodate impaired sensation  [x]  decubitus ulcers present or history  [x]  unable to shift weight  [x]  increase pressure distribution  [x]  prevent pelvic extension  []  custom required "off-the-shelf"    seat cushion will not accommodate deformity  [x]  stabilize/promote pelvis alignment  []  stabilize/promote femur alignment  []  accommodate obliquity  []  accommodate multiple deformity  [x]  incontinent/accidents  [x]  low maintenance     []  seat mounts                 []  fixed []  removable  []  attach seat platform/cushion to wheelchair frame    []  Seat wedge    []  provide increased aggressiveness of seat shape to decrease sliding  down in the seat  []  accommodate ROM        []  Cover replacement   []  protect back or seat cushion  []  incontinent/accidents    []  Solid seat / insert    []  support cushion to prevent      hammocking  []  allows attachment of cushion to mobility base    [x]  Lateral pelvic/thigh/hip     support (Guides)     [x]  decrease abduction  [x]  accommodate pelvis  [x]  position upper legs  []  accommodate spasticity  [x]  removable for transfers     [x]  Lateral pelvic/thigh      supports mounts  []   fixed   []  swing-away   [x]  removable  [x]  mounts lateral pelvic/thigh supports     [x]  mounts lateral pelvic/thigh supports swing-away or removable for transfers    [x]  Medial knee support [x] decrease adduction  [x] accommodate ROM  [x]  remove for transfers   [x]  alignment      [x]  Medial knee   []  fixed      support mounts      [x]  swing-away   []  removable  [x]  mounts medial knee supports   [x]  Mounts medial supports swing- away or removable for  transfers       Component  Justification   [x]  Back    Corpus 3G   [x]  provide posterior trunk support [x]  facilitate tone  [x]  provide lumbar/sacral support []  accommodate deformity  [x]  support trunk in midline   []  custom required "off-the-shelf" back support will not accommodate deformity   []  provide lateral trunk support []  accommodate or decrease tone            []  Back mounts  []  fixed  []  removable  []  attach back rest/cushion to wheelchair frame   []  Lateral trunk      supports  []  R []  L  []  decrease lateral trunk leaning  []  accommodate asymmetry    []  contour for increased contact  []  safety    []  control of tone    []  Lateral trunk      supports mounts  []  fixed  []  swing-away   []  removable  []  mounts lateral trunk supports     []  Mounts lateral trunk supports swing-away or removable for transfers   [x]  Anterior chest      strap, vest     []  decrease forward movement of shoulder  [x]  decrease forward movement of trunk  [x]  safety/stability  [x]  added abdominal support  [x]  trunk alignment  []  assistance with shoulder control   []  decrease shoulder elevation    [x]  Headrest      [x]  provide posterior head support  [x]  provide posterior neck support  []  provide lateral head support  []  provide anterior head support  [x]  support during tilt and recline  []  improve feeding     []  improve respiration  []  placement of switches  [x]  safety    [x]  accommodate ROM   [x]  accommodate tone  []  improve visual orientation    [x]  Headrest           []  fixed [x]  removable []  flip down      Mounting hardware   []  swing-away laterals/switches  [x]  mount headrest   [x]  mounts headrest flip down or  removable for transfers  []  mount headrest swing-away laterals   []  mount switches     []  Neck Support    []  decrease neck rotation  []  decrease forward neck flexion   Pelvic Positioner    []  std hip belt          [x]  padded hip belt  []  dual pull hip belt  []  four point hip belt  [x]  stabilize tone  [x]  decrease falling out of chair  [x]  prevent excessive extension  []  special pull angle to control      rotation  [x]  pad for protection over boney   prominence  [x]  promote comfort    []  Essential needs        bag/pouch   []  medicines []  special food rorthotics []  clothing changes  []  diapers  []  catheter/hygiene []  ostomy supplies   The above equipment has a life- long use expectancy.  Growth and changes in medical and/or functional conditions would be the exceptions.   SUMMARY:  Why mobility device was selected; include why a lower level device is not appropriate: Patient requires use of a power wheelchair in order to perform safe and independent mobility in her home and in order to perform her MRADLs in a timely manner. Due to her impaired endurance and history of complete paraplegia she is unable to safely and efficiently propel herself in a manual wheelchair without putting  significant strain on her shoulder joints and putting herself at risk for injury. Additionally, due to her history of complete paraplegia she is unable to ambulate or stand for pressure relief, therefore requiring power wheeled mobility.   Power Tilt: Ms. Bray is unable to perform safe and independent weight shifts or pressure relief and requires power tilt to allow him to do so independently. She also requires power tilt for management and facilitation of low tone. She also requires use of tilt for proper intermittent catheterization.  She has  had skin breakdown with skin protection cushion and thus requires tilt to further improve offloading and pressure relief.  Anterior Tilt/Active Reach:  Ms. Baldini requires the anterior tilt feature to improve functional reach for mobility related activities of daily living including reaching to the back of countertops, the sink for hygiene, cabinets for general access in kitchen and bathroom, and meal prep.  This feature also improves her engagement and safety with transfers as it facilitates improved lower body and trunk positioning.  Without this feature Ms. Spillers loses independence and is at risk for skin breakdown and poorly managed tone as she cannot engage her lower core and bilateral lower extremities and pelvis to improve her positioning and weight distribution, to reach and access kitchen, bathroom and other similar areas in her home and assist in her own transfers.   Power Recline: Ms. Popoca requires power recline as power tilt is not sufficient for management and preservation of skin integrity alone. She has a history of and current wounds to bilateral ischia and coupled with incontinence she is at a high risk of skin breakdown.  Power recline will assist with proper positioning of lower body and pelvis to manage intermittent catheterization and pressure relief to decrease reoccurrence rate of skin breakdown.   Elevator: Ms. Ghattas requires use of a seat elevator to be able to adjust the height of her seat to allow for improved positioning and independence with sliding board transfers as well as access to countertops for hygiene and ADLs.   Center mount power elevating leg rests: Ms. Apple does not possess the physical abilities to raise/lower ELR but does have the cognitive ability to perform this function with power ELR. She requires use of ELR for safe positioning of her LE when performing tilt and recline for pressure relief.   Power wheelchair controls: Ms. Mulgrew exhibits poor pelvic  and lower core control impacting endurance and safe positioning for manual wheelchair propulsion due to complete paraplegia and needs to be able to operate her seat functions through her joystick as she would not be able to do so independently otherwise.   Battery: Ms. Sandell power wheelchair will require a battery to allow this device to be powered.   Height-adjustable armrests: Ms. Murfin requires height-adjustable armrests to improve midline positioning in wheelchair and properly position and support her arms.   Foot plate: Ms. Skowron will require a foot plate on her wheelchair to support her feet while sitting in her chair. She requires the foot plate to be able to flip-up to allow for safe transfers in/out of her power wheelchair.   Seat cushion: Ms. Armenteros requires a ROHO High Profile Single Valve cushion to distribute pressure away from her sacrum and ischia as she is at a high risk for skin breakdown due to spending an extended amount of time sitting and having prior and current history of pressure wounds to sacrum, ischia, and buttocks.   Lateral thigh guides + mounts: Ms. Clute tends  to sit with her hips abducted and needs external correction to keep her hips in alignment. Having lateral thigh guides will assist her with keeping her hips and therefore her pelvis in alignment and positioned well in her wheelchair. She also requires mounting hardware for the lateral thigh guides so that they can be mounted to the chair but removed for transfers.   Anterior chest strap/harness: As previously mentioned, Ms. Minish has impaired lower core and pelvic control due to hypotonicity, putting her at risk for falling out of her power wheelchair. She requires a chest strap for safety to prevent anterior loss of balance and falls out of her chair. Standard lateral trunk supports do not prevent anterior loss of balance.   Headrest + mount: A headrest is required to support Ms. Schwall's head while her  power wheelchair is tilted and/or reclined for pressure relief and intermittent catheterization. She will also require mounting hardware in order to attach the headrest to her chair and allow for the headrest to be adjusted to the correct dimensions for her.   Pelvic positioner belt: Ms. Flanigan requires a pelvic positioning belt to maintain her hips all the way back in her seat. She tends to slide forward due to low pelvic tone and extension positioning and does not have the physical abilities to move herself back in the chair once she slides forward.   ASSESSMENT:  CLINICAL IMPRESSION: Patient is a 59 y.o. female who was seen today for physical therapy evaluation and treatment for power wheelchair with recommendation for power seat elevator, power tilt, power recline and power elevating leg rests.   CLINICAL DECISION MAKING: Stable/uncomplicated  EVALUATION COMPLEXITY: High                                   GOALS: One time visit. No goals established.    PLAN: PT FREQUENCY: one time visit    Daved KATHEE Bull, PT, DPT 06/22/2024, 2:31 PM    I concur with the above findings and recommendations of the therapist:  Physician name printed:         Physician's signature:      Date:

## 2024-06-25 ENCOUNTER — Other Ambulatory Visit: Payer: Self-pay

## 2024-06-27 ENCOUNTER — Other Ambulatory Visit: Payer: Self-pay

## 2024-06-28 ENCOUNTER — Other Ambulatory Visit: Payer: Self-pay

## 2024-06-28 NOTE — Progress Notes (Signed)
 Specialty Pharmacy Refill Coordination Note  Rhudee Shainna Faux is a 58 y.o. female contacted today regarding refills of specialty medication(s) Bictegravir-Emtricitab-Tenofov (Biktarvy )   Patient requested Delivery   Delivery date: 07/02/24   Verified address: [address], [place], [place] [address]   Medication will be filled on 06/29/24.

## 2024-06-29 ENCOUNTER — Other Ambulatory Visit: Payer: Self-pay

## 2024-07-02 ENCOUNTER — Other Ambulatory Visit (HOSPITAL_COMMUNITY): Payer: Self-pay

## 2024-07-11 ENCOUNTER — Other Ambulatory Visit (HOSPITAL_COMMUNITY): Payer: Self-pay

## 2024-07-12 ENCOUNTER — Encounter (HOSPITAL_COMMUNITY): Payer: Self-pay

## 2024-07-12 ENCOUNTER — Other Ambulatory Visit (HOSPITAL_COMMUNITY): Payer: Self-pay

## 2024-07-13 ENCOUNTER — Encounter: Attending: Physical Medicine and Rehabilitation | Admitting: Registered Nurse

## 2024-07-13 ENCOUNTER — Other Ambulatory Visit (HOSPITAL_COMMUNITY): Payer: Self-pay

## 2024-07-17 ENCOUNTER — Other Ambulatory Visit (HOSPITAL_COMMUNITY): Payer: Self-pay

## 2024-07-17 ENCOUNTER — Other Ambulatory Visit: Payer: Self-pay

## 2024-07-17 MED ORDER — BLOOD GLUCOSE TEST VI STRP
1.0000 | ORAL_STRIP | 3 refills | Status: AC
  Filled 2024-07-17: qty 200, 100d supply, fill #0

## 2024-07-17 MED ORDER — LANCETS MISC
Freq: Two times a day (BID) | 3 refills | Status: DC
  Filled 2024-07-17: qty 200, 100d supply, fill #0

## 2024-07-18 ENCOUNTER — Ambulatory Visit (INDEPENDENT_AMBULATORY_CARE_PROVIDER_SITE_OTHER): Payer: Self-pay | Admitting: Podiatry

## 2024-07-18 ENCOUNTER — Other Ambulatory Visit: Payer: Self-pay

## 2024-07-18 DIAGNOSIS — Z91199 Patient's noncompliance with other medical treatment and regimen due to unspecified reason: Secondary | ICD-10-CM

## 2024-07-18 NOTE — Progress Notes (Signed)
 No show

## 2024-07-20 ENCOUNTER — Encounter: Admitting: Registered Nurse
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
## 2024-07-24 ENCOUNTER — Other Ambulatory Visit: Payer: Self-pay

## 2024-07-24 ENCOUNTER — Encounter: Attending: Physical Medicine and Rehabilitation | Admitting: Registered Nurse

## 2024-07-24 ENCOUNTER — Encounter: Payer: Self-pay | Admitting: Registered Nurse

## 2024-07-24 VITALS — BP 91/61 | HR 101 | Ht 63.0 in | Wt 140.0 lb

## 2024-07-24 DIAGNOSIS — Z79899 Other long term (current) drug therapy: Secondary | ICD-10-CM | POA: Diagnosis not present

## 2024-07-24 DIAGNOSIS — G8221 Paraplegia, complete: Secondary | ICD-10-CM | POA: Diagnosis present

## 2024-07-24 DIAGNOSIS — Z5181 Encounter for therapeutic drug level monitoring: Secondary | ICD-10-CM | POA: Insufficient documentation

## 2024-07-24 DIAGNOSIS — G8921 Chronic pain due to trauma: Secondary | ICD-10-CM | POA: Diagnosis present

## 2024-07-24 DIAGNOSIS — G894 Chronic pain syndrome: Secondary | ICD-10-CM | POA: Insufficient documentation

## 2024-07-24 DIAGNOSIS — R252 Cramp and spasm: Secondary | ICD-10-CM | POA: Diagnosis present

## 2024-07-24 DIAGNOSIS — Z993 Dependence on wheelchair: Secondary | ICD-10-CM | POA: Diagnosis present

## 2024-07-24 DIAGNOSIS — Z79891 Long term (current) use of opiate analgesic: Secondary | ICD-10-CM | POA: Diagnosis present

## 2024-07-24 DIAGNOSIS — M7918 Myalgia, other site: Secondary | ICD-10-CM | POA: Insufficient documentation

## 2024-07-24 MED ORDER — OXYCODONE-ACETAMINOPHEN 10-325 MG PO TABS
1.0000 | ORAL_TABLET | Freq: Three times a day (TID) | ORAL | 0 refills | Status: DC | PRN
  Filled 2024-07-24: qty 90, 30d supply, fill #0
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
## 2024-07-25 ENCOUNTER — Other Ambulatory Visit (HOSPITAL_COMMUNITY): Payer: Self-pay

## 2024-07-26 ENCOUNTER — Ambulatory Visit: Admitting: Obstetrics and Gynecology

## 2024-07-26 ENCOUNTER — Other Ambulatory Visit: Payer: Self-pay

## 2024-07-27 ENCOUNTER — Other Ambulatory Visit: Payer: Self-pay

## 2024-07-27 NOTE — Progress Notes (Signed)
 Specialty Pharmacy Refill Coordination Note  Ellen Lane is a 58 y.o. female contacted today regarding refills of specialty medication(s) Bictegravir-Emtricitab-Tenofov (Biktarvy )   Patient requested Delivery   Delivery date: 07/30/24   Verified address: [address], [place], [place] [address]   Medication will be filled on 07/27/24.

## 2024-07-29 LAB — DRUG TOX MONITOR 1 W/CONF, ORAL FLD
Amphetamines: NEGATIVE ng/mL (ref <10)
Barbiturates: NEGATIVE ng/mL (ref <10)
Benzodiazepines: NEGATIVE ng/mL (ref <0.50)
Buprenorphine: NEGATIVE ng/mL (ref <0.10)
Cocaine: NEGATIVE ng/mL (ref <5.0)
Codeine: NEGATIVE ng/mL (ref <2.5)
Dihydrocodeine: NEGATIVE ng/mL (ref <2.5)
Fentanyl: NEGATIVE ng/mL (ref <0.10)
Hydromorphone: NEGATIVE ng/mL (ref <2.5)
MARIJUANA: NEGATIVE ng/mL (ref <2.5)
MDMA: NEGATIVE ng/mL (ref <10)
Meprobamate: NEGATIVE ng/mL (ref <2.5)
Methadone: NEGATIVE ng/mL (ref <5.0)
Morphine: NEGATIVE ng/mL (ref <2.5)
Nicotine Metabolite: NEGATIVE ng/mL (ref <5.0)
Norhydrocodone: NEGATIVE ng/mL (ref <2.5)
Noroxycodone: 17.7 ng/mL — ABNORMAL HIGH (ref <2.5)
Opiates: POSITIVE ng/mL — AB (ref <2.5)
Oxycodone: >250 ng/mL — ABNORMAL HIGH (ref <2.5)
Oxymorphone: 6.1 ng/mL — ABNORMAL HIGH (ref <2.5)
Phencyclidine: NEGATIVE ng/mL (ref <10)
Tapentadol: NEGATIVE ng/mL (ref <5.0)
Tramadol: NEGATIVE ng/mL (ref <5.0)
Zolpidem: NEGATIVE ng/mL (ref <5.0)

## 2024-07-29 LAB — DRUG TOX ALC METAB W/CON, ORAL FLD: Alcohol Metabolite: NEGATIVE ng/mL (ref <25)

## 2024-07-31 ENCOUNTER — Other Ambulatory Visit (HOSPITAL_COMMUNITY): Payer: Self-pay

## 2024-08-06 ENCOUNTER — Other Ambulatory Visit (HOSPITAL_COMMUNITY): Payer: Self-pay

## 2024-08-09 ENCOUNTER — Other Ambulatory Visit (HOSPITAL_COMMUNITY): Payer: Self-pay

## 2024-08-16 ENCOUNTER — Other Ambulatory Visit (HOSPITAL_COMMUNITY): Payer: Self-pay

## 2024-08-20 ENCOUNTER — Encounter: Payer: Self-pay | Admitting: Radiology

## 2024-08-21 ENCOUNTER — Other Ambulatory Visit (HOSPITAL_COMMUNITY): Payer: Self-pay

## 2024-08-22 ENCOUNTER — Other Ambulatory Visit: Payer: Self-pay

## 2024-08-22 ENCOUNTER — Other Ambulatory Visit (HOSPITAL_COMMUNITY): Payer: Self-pay

## 2024-08-23 ENCOUNTER — Other Ambulatory Visit (HOSPITAL_COMMUNITY): Payer: Self-pay

## 2024-08-23 ENCOUNTER — Other Ambulatory Visit: Payer: Self-pay

## 2024-08-23 ENCOUNTER — Telehealth: Payer: Self-pay | Admitting: Registered Nurse

## 2024-08-23 NOTE — Telephone Encounter (Signed)
 Patient is calling requesting a refill of Oxy. She states her prescription is due tomorrow

## 2024-08-24 ENCOUNTER — Telehealth: Payer: Self-pay | Admitting: Registered Nurse

## 2024-08-24 ENCOUNTER — Other Ambulatory Visit (HOSPITAL_COMMUNITY): Payer: Self-pay

## 2024-08-24 ENCOUNTER — Other Ambulatory Visit (HOSPITAL_BASED_OUTPATIENT_CLINIC_OR_DEPARTMENT_OTHER): Payer: Self-pay

## 2024-08-24 ENCOUNTER — Other Ambulatory Visit: Payer: Self-pay | Admitting: Registered Nurse

## 2024-08-24 ENCOUNTER — Other Ambulatory Visit: Payer: Self-pay

## 2024-08-24 DIAGNOSIS — G8921 Chronic pain due to trauma: Secondary | ICD-10-CM

## 2024-08-24 DIAGNOSIS — G894 Chronic pain syndrome: Secondary | ICD-10-CM

## 2024-08-24 MED ORDER — OXYCODONE-ACETAMINOPHEN 10-325 MG PO TABS
1.0000 | ORAL_TABLET | Freq: Three times a day (TID) | ORAL | 0 refills | Status: DC | PRN
  Filled 2024-08-24: qty 90, 30d supply, fill #0

## 2024-08-24 NOTE — Telephone Encounter (Signed)
 PMP was Reviewed.  Oxycodone  e-scribed to pharmacy.  Ellen Lane is aware via My-Chart message

## 2024-08-27 ENCOUNTER — Other Ambulatory Visit: Payer: Self-pay

## 2024-08-29 ENCOUNTER — Other Ambulatory Visit: Payer: Self-pay | Admitting: Pharmacy Technician

## 2024-08-29 ENCOUNTER — Other Ambulatory Visit: Payer: Self-pay

## 2024-08-29 NOTE — Progress Notes (Signed)
 Specialty Pharmacy Refill Coordination Note  Ellen Lane is a 58 y.o. female contacted today regarding refills of specialty medication(s) Bictegravir-Emtricitab-Tenofov (Biktarvy )   Patient requested Delivery   Delivery date: 08/30/24   Verified address: [address]  [place]   Medication will be filled on: 08/29/24

## 2024-08-30 ENCOUNTER — Other Ambulatory Visit (HOSPITAL_COMMUNITY): Payer: Self-pay

## 2024-09-03 ENCOUNTER — Other Ambulatory Visit (HOSPITAL_COMMUNITY): Payer: Self-pay

## 2024-09-07 ENCOUNTER — Ambulatory Visit: Admitting: Physical Medicine and Rehabilitation

## 2024-09-14 ENCOUNTER — Other Ambulatory Visit (HOSPITAL_COMMUNITY): Payer: Self-pay

## 2024-09-14 MED ORDER — OZEMPIC (2 MG/DOSE) 8 MG/3ML ~~LOC~~ SOPN
2.0000 mg | PEN_INJECTOR | SUBCUTANEOUS | 5 refills | Status: AC
  Filled 2024-09-14: qty 3, 28d supply, fill #0
  Filled 2024-10-09: qty 3, 28d supply, fill #1
  Filled 2024-11-08: qty 3, 28d supply, fill #2

## 2024-09-17 ENCOUNTER — Other Ambulatory Visit: Payer: Self-pay | Admitting: Registered Nurse

## 2024-09-17 ENCOUNTER — Other Ambulatory Visit (HOSPITAL_COMMUNITY): Payer: Self-pay

## 2024-09-17 DIAGNOSIS — G894 Chronic pain syndrome: Secondary | ICD-10-CM

## 2024-09-17 DIAGNOSIS — G8921 Chronic pain due to trauma: Secondary | ICD-10-CM

## 2024-09-17 MED ORDER — OXYCODONE-ACETAMINOPHEN 10-325 MG PO TABS
1.0000 | ORAL_TABLET | Freq: Three times a day (TID) | ORAL | 0 refills | Status: AC | PRN
  Filled 2024-09-17 – 2024-09-21 (×2): qty 90, 30d supply, fill #0

## 2024-09-17 NOTE — Telephone Encounter (Signed)
 PDMP was Reviewed.  Oxycodone  e-scribed to pharmacy.

## 2024-09-18 ENCOUNTER — Other Ambulatory Visit (HOSPITAL_COMMUNITY): Payer: Self-pay

## 2024-09-19 ENCOUNTER — Other Ambulatory Visit (HOSPITAL_COMMUNITY): Payer: Self-pay

## 2024-09-21 ENCOUNTER — Other Ambulatory Visit (HOSPITAL_COMMUNITY): Payer: Self-pay

## 2024-09-21 ENCOUNTER — Other Ambulatory Visit: Payer: Self-pay

## 2024-09-24 ENCOUNTER — Other Ambulatory Visit (HOSPITAL_COMMUNITY): Payer: Self-pay

## 2024-09-25 ENCOUNTER — Other Ambulatory Visit: Payer: Self-pay

## 2024-09-27 ENCOUNTER — Other Ambulatory Visit (HOSPITAL_COMMUNITY): Payer: Self-pay

## 2024-09-28 ENCOUNTER — Other Ambulatory Visit (HOSPITAL_COMMUNITY): Payer: Self-pay

## 2024-09-28 ENCOUNTER — Other Ambulatory Visit: Payer: Self-pay

## 2024-09-28 MED ORDER — POTASSIUM CHLORIDE CRYS ER 20 MEQ PO TBCR
20.0000 meq | EXTENDED_RELEASE_TABLET | Freq: Every day | ORAL | 5 refills | Status: AC
  Filled 2024-09-28: qty 30, 30d supply, fill #0
  Filled 2024-11-02: qty 30, 30d supply, fill #1

## 2024-09-28 NOTE — Progress Notes (Signed)
 Specialty Pharmacy Refill Coordination Note  Spoke with Ellen Lane  Ellen Lane is a 58 y.o. female contacted today regarding refills of specialty medication(s) Bictegravir-Emtricitab-Tenofov (Biktarvy )  Doses on hand: 4  Patient requested: Delivery   Delivery date: 10/01/24   Verified address: [address] [place] [address]  Medication will be filled on 09/28/24

## 2024-10-01 ENCOUNTER — Encounter: Attending: Physical Medicine and Rehabilitation | Admitting: Physical Medicine and Rehabilitation

## 2024-10-01 DIAGNOSIS — Z5181 Encounter for therapeutic drug level monitoring: Secondary | ICD-10-CM | POA: Insufficient documentation

## 2024-10-01 DIAGNOSIS — G8921 Chronic pain due to trauma: Secondary | ICD-10-CM | POA: Insufficient documentation

## 2024-10-01 DIAGNOSIS — Z79899 Other long term (current) drug therapy: Secondary | ICD-10-CM | POA: Insufficient documentation

## 2024-10-01 DIAGNOSIS — M7918 Myalgia, other site: Secondary | ICD-10-CM | POA: Insufficient documentation

## 2024-10-01 DIAGNOSIS — R252 Cramp and spasm: Secondary | ICD-10-CM | POA: Insufficient documentation

## 2024-10-01 DIAGNOSIS — G894 Chronic pain syndrome: Secondary | ICD-10-CM | POA: Insufficient documentation

## 2024-10-01 DIAGNOSIS — Z993 Dependence on wheelchair: Secondary | ICD-10-CM | POA: Insufficient documentation

## 2024-10-01 DIAGNOSIS — G8221 Paraplegia, complete: Secondary | ICD-10-CM | POA: Insufficient documentation

## 2024-10-01 DIAGNOSIS — Z79891 Long term (current) use of opiate analgesic: Secondary | ICD-10-CM | POA: Insufficient documentation

## 2024-10-05 ENCOUNTER — Other Ambulatory Visit (HOSPITAL_COMMUNITY): Payer: Self-pay

## 2024-10-09 ENCOUNTER — Other Ambulatory Visit (HOSPITAL_COMMUNITY): Payer: Self-pay

## 2024-10-09 ENCOUNTER — Other Ambulatory Visit: Payer: Self-pay

## 2024-10-17 ENCOUNTER — Other Ambulatory Visit: Payer: Self-pay

## 2024-10-22 ENCOUNTER — Other Ambulatory Visit (HOSPITAL_COMMUNITY): Payer: Self-pay

## 2024-10-25 ENCOUNTER — Other Ambulatory Visit: Payer: Self-pay

## 2024-10-26 ENCOUNTER — Other Ambulatory Visit: Payer: Self-pay

## 2024-10-26 NOTE — Progress Notes (Signed)
 Specialty Pharmacy Ongoing Clinical Assessment Note  Ellen Lane is a 59 y.o. female who is being followed by the specialty pharmacy service for RxSp HIV   Patient's specialty medication(s) reviewed today: Bictegravir-Emtricitab-Tenofov (Biktarvy )   Missed doses in the last 4 weeks: 0   Patient/Caregiver did not have any additional questions or concerns.   Therapeutic benefit summary: Patient is achieving benefit   Adverse events/side effects summary: No adverse events/side effects   Patient's therapy is appropriate to: Continue    Goals Addressed             This Visit's Progress    Achieve Undetectable HIV Viral Load < 20   On track    Patient is on track. Patient will maintain adherence. Patient's viral load remain undetectable long term.      Comply with lab assessments   On track    Patient is on track. Patient will maintain adherence      Increase CD4 count until steady state   On track    Patient is on track. Patient will maintain adherence      Maintain optimal adherence to therapy   On track    Patient is on track. Patient will maintain adherence         Follow up: 12 months  [facility]

## 2024-10-26 NOTE — Progress Notes (Signed)
 Specialty Pharmacy Refill Coordination Note  Ellen Lane is a 59 y.o. female contacted today regarding refills of specialty medication(s) Bictegravir-Emtricitab-Tenofov (Biktarvy )   Patient requested Delivery   Delivery date: 10/29/24   Verified address: [address] [place] [address]   Medication will be filled on: 10/26/24

## 2024-10-31 ENCOUNTER — Other Ambulatory Visit (HOSPITAL_COMMUNITY): Payer: Self-pay

## 2024-10-31 MED ORDER — OXYBUTYNIN CHLORIDE 5 MG PO TABS
5.0000 mg | ORAL_TABLET | Freq: Three times a day (TID) | ORAL | 3 refills | Status: AC | PRN
  Filled 2024-10-31: qty 270, 90d supply, fill #0

## 2024-11-01 ENCOUNTER — Other Ambulatory Visit: Payer: Self-pay

## 2024-11-02 ENCOUNTER — Other Ambulatory Visit (HOSPITAL_COMMUNITY): Payer: Self-pay

## 2024-11-02 ENCOUNTER — Other Ambulatory Visit: Payer: Self-pay

## 2024-11-02 MED ORDER — FUROSEMIDE 20 MG PO TABS
20.0000 mg | ORAL_TABLET | Freq: Every day | ORAL | 5 refills | Status: AC
  Filled 2024-11-02: qty 30, 30d supply, fill #0

## 2024-11-09 ENCOUNTER — Other Ambulatory Visit: Payer: Self-pay

## 2024-11-09 ENCOUNTER — Other Ambulatory Visit (HOSPITAL_COMMUNITY): Payer: Self-pay

## 2024-11-13 ENCOUNTER — Other Ambulatory Visit (HOSPITAL_COMMUNITY): Payer: Self-pay

## 2024-11-16 ENCOUNTER — Other Ambulatory Visit: Payer: Self-pay

## 2024-11-20 ENCOUNTER — Other Ambulatory Visit: Payer: Self-pay

## 2024-11-20 ENCOUNTER — Other Ambulatory Visit (HOSPITAL_COMMUNITY): Payer: Self-pay

## 2024-11-20 MED ORDER — CEPHALEXIN 500 MG PO CAPS
500.0000 mg | ORAL_CAPSULE | Freq: Two times a day (BID) | ORAL | 0 refills | Status: AC
  Filled 2024-11-20: qty 14, 7d supply, fill #0

## 2024-11-21 ENCOUNTER — Encounter: Admitting: Registered Nurse

## 2024-11-22 ENCOUNTER — Other Ambulatory Visit: Payer: Self-pay

## 2024-12-24 ENCOUNTER — Ambulatory Visit: Payer: Self-pay | Admitting: Student

## 2025-01-22 ENCOUNTER — Ambulatory Visit: Payer: Self-pay | Admitting: Infectious Disease
